# Patient Record
Sex: Male | Born: 1935 | Race: White | Hispanic: No | Marital: Married | Smoking: Former smoker
Health system: Southern US, Community
[De-identification: ages and names within clinical notes are randomized; demographics above are authoritative.]

## PROBLEM LIST (undated history)

## (undated) DIAGNOSIS — M1990 Unspecified osteoarthritis, unspecified site: Secondary | ICD-10-CM

## (undated) DIAGNOSIS — F419 Anxiety disorder, unspecified: Secondary | ICD-10-CM

## (undated) DIAGNOSIS — I4891 Unspecified atrial fibrillation: Secondary | ICD-10-CM

## (undated) DIAGNOSIS — I639 Cerebral infarction, unspecified: Secondary | ICD-10-CM

## (undated) DIAGNOSIS — I4892 Unspecified atrial flutter: Secondary | ICD-10-CM

## (undated) DIAGNOSIS — I10 Essential (primary) hypertension: Secondary | ICD-10-CM

## (undated) DIAGNOSIS — Z8669 Personal history of other diseases of the nervous system and sense organs: Secondary | ICD-10-CM

## (undated) DIAGNOSIS — C801 Malignant (primary) neoplasm, unspecified: Secondary | ICD-10-CM

## (undated) DIAGNOSIS — B0221 Postherpetic geniculate ganglionitis: Secondary | ICD-10-CM

## (undated) DIAGNOSIS — I499 Cardiac arrhythmia, unspecified: Secondary | ICD-10-CM

## (undated) DIAGNOSIS — Z932 Ileostomy status: Secondary | ICD-10-CM

## (undated) HISTORY — DX: Unspecified atrial flutter: I48.92

## (undated) HISTORY — DX: Postherpetic geniculate ganglionitis: B02.21

## (undated) HISTORY — DX: Unspecified osteoarthritis, unspecified site: M19.90

## (undated) HISTORY — PX: VASECTOMY: SHX75

## (undated) HISTORY — DX: Essential (primary) hypertension: I10

## (undated) HISTORY — PX: HERNIA REPAIR: SHX51

## (undated) HISTORY — PX: OTHER SURGICAL HISTORY: SHX169

## (undated) HISTORY — PX: CATARACT EXTRACTION: SUR2

## (undated) SURGERY — COLONOSCOPY WITH ESOPHAGOGASTRODUODENOSCOPY (EGD)
Anesthesia: Moderate Sedation

---

## 1997-08-17 ENCOUNTER — Encounter: Admission: RE | Admit: 1997-08-17 | Discharge: 1997-08-17 | Payer: Self-pay | Admitting: Family Medicine

## 1998-01-28 ENCOUNTER — Encounter: Admission: RE | Admit: 1998-01-28 | Discharge: 1998-01-28 | Payer: Self-pay | Admitting: Sports Medicine

## 1998-10-29 ENCOUNTER — Encounter: Admission: RE | Admit: 1998-10-29 | Discharge: 1998-10-29 | Payer: Self-pay | Admitting: Family Medicine

## 1998-11-06 ENCOUNTER — Inpatient Hospital Stay (HOSPITAL_COMMUNITY): Admission: EM | Admit: 1998-11-06 | Discharge: 1998-11-08 | Payer: Self-pay | Admitting: Emergency Medicine

## 1998-11-06 ENCOUNTER — Encounter: Payer: Self-pay | Admitting: Otolaryngology

## 1999-08-05 ENCOUNTER — Other Ambulatory Visit: Admission: RE | Admit: 1999-08-05 | Discharge: 1999-08-05 | Payer: Self-pay | Admitting: *Deleted

## 2000-07-05 ENCOUNTER — Encounter: Payer: Self-pay | Admitting: Allergy and Immunology

## 2000-07-05 ENCOUNTER — Encounter: Admission: RE | Admit: 2000-07-05 | Discharge: 2000-07-05 | Payer: Self-pay | Admitting: Allergy and Immunology

## 2000-12-18 ENCOUNTER — Ambulatory Visit (HOSPITAL_COMMUNITY): Admission: RE | Admit: 2000-12-18 | Discharge: 2000-12-18 | Payer: Self-pay | Admitting: Gastroenterology

## 2005-07-20 ENCOUNTER — Ambulatory Visit: Payer: Self-pay | Admitting: Family Medicine

## 2005-10-02 ENCOUNTER — Ambulatory Visit: Payer: Self-pay | Admitting: Family Medicine

## 2005-10-06 ENCOUNTER — Ambulatory Visit: Payer: Self-pay | Admitting: Family Medicine

## 2006-05-17 DIAGNOSIS — F5232 Male orgasmic disorder: Secondary | ICD-10-CM

## 2006-05-17 DIAGNOSIS — I10 Essential (primary) hypertension: Secondary | ICD-10-CM

## 2006-05-17 DIAGNOSIS — F17200 Nicotine dependence, unspecified, uncomplicated: Secondary | ICD-10-CM

## 2006-05-17 DIAGNOSIS — K5730 Diverticulosis of large intestine without perforation or abscess without bleeding: Secondary | ICD-10-CM | POA: Insufficient documentation

## 2006-05-17 DIAGNOSIS — N400 Enlarged prostate without lower urinary tract symptoms: Secondary | ICD-10-CM | POA: Insufficient documentation

## 2006-08-02 ENCOUNTER — Encounter: Admission: RE | Admit: 2006-08-02 | Discharge: 2006-08-02 | Payer: Self-pay | Admitting: Sports Medicine

## 2006-08-02 ENCOUNTER — Ambulatory Visit (HOSPITAL_COMMUNITY): Admission: RE | Admit: 2006-08-02 | Discharge: 2006-08-02 | Payer: Self-pay | Admitting: Family Medicine

## 2006-08-02 ENCOUNTER — Ambulatory Visit: Payer: Self-pay | Admitting: Family Medicine

## 2006-08-02 ENCOUNTER — Encounter: Payer: Self-pay | Admitting: Family Medicine

## 2006-08-02 DIAGNOSIS — G609 Hereditary and idiopathic neuropathy, unspecified: Secondary | ICD-10-CM | POA: Insufficient documentation

## 2006-08-02 LAB — CONVERTED CEMR LAB
Albumin: 4.2 g/dL (ref 3.5–5.2)
BUN: 23 mg/dL (ref 6–23)
CO2: 27 meq/L (ref 19–32)
Calcium: 9.3 mg/dL (ref 8.4–10.5)
Chloride: 102 meq/L (ref 96–112)
Direct LDL: 84 mg/dL
Glucose, Bld: 85 mg/dL (ref 70–99)
Potassium: 4.6 meq/L (ref 3.5–5.3)
Vitamin B-12: 305 pg/mL (ref 211–911)

## 2006-08-08 ENCOUNTER — Telehealth (INDEPENDENT_AMBULATORY_CARE_PROVIDER_SITE_OTHER): Payer: Self-pay | Admitting: Family Medicine

## 2006-08-10 ENCOUNTER — Encounter: Payer: Self-pay | Admitting: Family Medicine

## 2006-08-30 ENCOUNTER — Ambulatory Visit: Payer: Self-pay | Admitting: Sports Medicine

## 2006-08-30 DIAGNOSIS — M7750 Other enthesopathy of unspecified foot: Secondary | ICD-10-CM | POA: Insufficient documentation

## 2007-01-10 ENCOUNTER — Ambulatory Visit: Payer: Self-pay | Admitting: Family Medicine

## 2007-07-16 ENCOUNTER — Ambulatory Visit: Payer: Self-pay | Admitting: Family Medicine

## 2007-07-23 ENCOUNTER — Encounter: Payer: Self-pay | Admitting: Family Medicine

## 2007-07-23 ENCOUNTER — Ambulatory Visit: Payer: Self-pay | Admitting: Family Medicine

## 2007-07-23 LAB — CONVERTED CEMR LAB: OCCULT 1: NEGATIVE

## 2007-07-24 ENCOUNTER — Encounter: Payer: Self-pay | Admitting: Family Medicine

## 2007-07-24 LAB — CONVERTED CEMR LAB
Albumin: 4.1 g/dL (ref 3.5–5.2)
Alkaline Phosphatase: 85 units/L (ref 39–117)
BUN: 18 mg/dL (ref 6–23)
Creatinine, Ser: 1.1 mg/dL (ref 0.40–1.50)
Glucose, Bld: 89 mg/dL (ref 70–99)
HDL: 61 mg/dL (ref >39)
LDL Cholesterol: 72 mg/dL (ref 0–99)
Potassium: 4.3 meq/L (ref 3.5–5.3)
Total Bilirubin: 1 mg/dL (ref 0.3–1.2)
Total CHOL/HDL Ratio: 2.3
Triglycerides: 52 mg/dL (ref <150)

## 2007-12-10 ENCOUNTER — Ambulatory Visit: Payer: Self-pay | Admitting: Family Medicine

## 2008-04-01 ENCOUNTER — Encounter: Payer: Self-pay | Admitting: Family Medicine

## 2009-08-25 ENCOUNTER — Ambulatory Visit: Payer: Self-pay | Admitting: Sports Medicine

## 2009-08-25 ENCOUNTER — Encounter: Payer: Self-pay | Admitting: Family Medicine

## 2009-08-25 ENCOUNTER — Encounter: Admission: RE | Admit: 2009-08-25 | Discharge: 2009-08-25 | Payer: Self-pay | Admitting: Family Medicine

## 2009-08-25 DIAGNOSIS — M545 Low back pain: Secondary | ICD-10-CM

## 2009-08-25 DIAGNOSIS — R209 Unspecified disturbances of skin sensation: Secondary | ICD-10-CM | POA: Insufficient documentation

## 2009-09-01 ENCOUNTER — Telehealth: Payer: Self-pay | Admitting: Family Medicine

## 2009-09-06 ENCOUNTER — Encounter: Payer: Self-pay | Admitting: Family Medicine

## 2009-09-06 ENCOUNTER — Telehealth: Payer: Self-pay | Admitting: Family Medicine

## 2009-09-06 ENCOUNTER — Encounter: Admission: RE | Admit: 2009-09-06 | Discharge: 2009-12-05 | Payer: Self-pay | Admitting: Family Medicine

## 2009-09-30 ENCOUNTER — Encounter: Payer: Self-pay | Admitting: Sports Medicine

## 2009-10-06 ENCOUNTER — Ambulatory Visit: Payer: Self-pay | Admitting: Sports Medicine

## 2009-10-06 DIAGNOSIS — M5430 Sciatica, unspecified side: Secondary | ICD-10-CM

## 2009-11-09 ENCOUNTER — Ambulatory Visit: Payer: Self-pay | Admitting: Sports Medicine

## 2009-12-09 ENCOUNTER — Ambulatory Visit: Payer: Self-pay | Admitting: Sports Medicine

## 2010-01-06 ENCOUNTER — Ambulatory Visit: Payer: Self-pay | Admitting: Sports Medicine

## 2010-01-27 ENCOUNTER — Encounter: Payer: Self-pay | Admitting: Family Medicine

## 2010-02-15 ENCOUNTER — Ambulatory Visit: Payer: Self-pay | Admitting: Sports Medicine

## 2010-04-19 NOTE — Assessment & Plan Note (Signed)
Summary: NUMBNESS IN LEG,MC   Vital Signs:  Patient profile:   77 year old male Pulse rate:   76 / minute BP sitting:   107 / 76  (left arm)  Vitals Entered By: Terese Door (October 06, 2009 10:23 AM) CC: F/U numbness is legs   CC:  F/U numbness is legs.  History of Present Illness: Patient was doing toe ups w 120 lbs Hx of old disk injury in lumbar spine started running again but RT leg went numb and felt weak  quad and HS exercises are both OK  even at rest has RT sided numbness/ some slt change on left  at nite gets dull ache in calf and thigh  went to PT - program w exercises doing bridges adductor work swimmer position bicycle off bike back series  on these only bridge causes RT leg tingling  Physical Exam  General:  Well-developed,well-nourished,in no acute distress; alert,appropriate and cooperative throughout examination Head:  facial assymetry Msk:  neg SLR bilat no  back pain on testing and normal motion  weak on RT with repetitive toe walking or heel walking tandem is OK Neurologic:  alert & oriented X3 and cranial nerves II-XII intact.     Impression & Recommendations:  Problem # 1:  BACK PAIN, LUMBAR (ICD-724.2)  His updated medication list for this problem includes:    Cvs Aspirin Ec 81 Mg Tbec (Aspirin) .Marland Kitchen... Daily   this has resolved during day pain at night now is primarily radiating into leg  Problem # 2:  SCIATICA, RIGHT (ICD-724.3)  His updated medication list for this problem includes:    Cvs Aspirin Ec 81 Mg Tbec (Aspirin) .Marland Kitchen... Daily  has some weakness and radiating pain to calf  suspect this is from old disk injury  will start low dose neurontin at HS and increase up to three times a day if needed  Complete Medication List: 1)  Viagra 100 Mg Tabs (Sildenafil citrate) .Marland Kitchen.. 1 tab prn 2)  Hydrochlorothiazide 25 Mg Tabs (Hydrochlorothiazide) .... Take 1 tab by mouth every morning 3)  Cardizem Cd 120 Mg Cp24 (Diltiazem hcl  coated beads) .... One daiy 4)  Terazosin Hcl 5 Mg Caps (Terazosin hcl) .... One qhs 5)  Cvs Aspirin Ec 81 Mg Tbec (Aspirin) .... Daily 6)  Glucosamine-chondroitin 1500-1200 Mg/46ml Liqd (Glucosamine-chondroitin) 7)  Fish Oil 1000 Mg Caps (Omega-3 fatty acids) .... Bid 8)  Selenium 100 Mcg Tabs (Selenium) .... Daiily 9)  Optivite Tabs (Multiple vitamins-minerals) 10)  Gabapentin 300 Mg Caps (Gabapentin) .Marland Kitchen.. 1 by mouth tid  Patient Instructions: 1)  Use gabapentin only at night 2)  we can always build up dose if needed 3)  watch side effects - sedation, dizzy, nite mares 4)  OK to do strength work 5)  for running do walk/ run 6)  when leg feels wobbly walk until strong and then run again 7)  reck 1 mo Prescriptions: GABAPENTIN 300 MG CAPS (GABAPENTIN) 1 by mouth tid  #90 x 2   Entered and Authorized by:   Enid Baas MD   Signed by:   Enid Baas MD on 10/06/2009   Method used:   Electronically to        [facility]  [phone]* (retail)       [address]       [place], [place]  [address]       Ph: [phone] or [phone]  Fax: [phone]   RxID:   [id number]

## 2010-04-19 NOTE — Letter (Signed)
Summary: [facility] Pt referral form  [facility] Pt referral form   Imported By: Marily Memos 08/25/2009 14:44:46  _____________________________________________________________________  External Attachment:    Type:   Image     Comment:   External Document

## 2010-04-19 NOTE — Miscellaneous (Signed)
Summary: [facility]  [facility]   Imported By: Marily Memos 09/08/2009 10:59:12  _____________________________________________________________________  External Attachment:    Type:   Image     Comment:   External Document

## 2010-04-19 NOTE — Assessment & Plan Note (Signed)
Summary: F/U,MC   Vital Signs:  Patient profile:   77 year old male BP sitting:   127 / 82  Vitals Entered By: Lillia Pauls CMA (February 15, 2010 11:52 AM)  History of Present Illness: 77yo male for f/u of sciatica & low back.  Overall feels gradual improvement continuing Not having any back pain at this time, only with excessive standing seems to givemhim some pain that radiates anteriorly. Usually right sided. Never past knee, did have some foot drop  previously but not having any problem at this time.   Still with occasional aching at night but much less frequent. . continues to walk 30-mins every morning without pain/discomfort and going to the gym daily would like to start running again if possible.  Taking gabapentin 600mg  three times a day, overall not experiencing any side effects.  Tramadol as needed  Continuing HEP for back & hip. Denies any change in bowel/bladder.     Current Medications (verified): 1)  Viagra 100 Mg  Tabs (Sildenafil Citrate) .Marland Kitchen.. 1 Tab Prn 2)  Hydrochlorothiazide 25 Mg  Tabs (Hydrochlorothiazide) .... Take 1 Tab By Mouth Every Morning 3)  Cardizem Cd 120 Mg  Cp24 (Diltiazem Hcl Coated Beads) .... One Daiy 4)  Terazosin Hcl 5 Mg  Caps (Terazosin Hcl) .... One Qhs 5)  Cvs Aspirin Ec 81 Mg  Tbec (Aspirin) .... Daily 6)  Glucosamine-Chondroitin 1500-1200 Mg/62ml  Liqd (Glucosamine-Chondroitin) 7)  Fish Oil 1000 Mg  Caps (Omega-3 Fatty Acids) .... Bid 8)  Selenium 100 Mcg  Tabs (Selenium) .... Daiily 9)  Optivite   Tabs (Multiple Vitamins-Minerals) 10)  Gabapentin 300 Mg Caps (Gabapentin) .Marland Kitchen.. 1 By Mouth Tid 11)  Tramadol Hcl 50 Mg Tabs (Tramadol Hcl) .Marland Kitchen.. 1 Tab By Mouth Three Times A Day 12)  Gabapentin 600 Mg Tabs (Gabapentin) .Marland Kitchen.. 1 Tab By Mouth Tid  Allergies (verified): No Known Drug Allergies  Past History:  Past medical, surgical, family and social histories (including risk factors) reviewed, and no changes noted (except as noted  below).  Past Medical History: Reviewed history from 07/16/2007 and no changes required. clindamycin 300 , 2 prior to dental procedures,  Ramsey Hunt-left facila weakness,  Supplements:selenium, glucosamine, fish oil  Past Surgical History: Reviewed history from 07/16/2007 and no changes required. Prostate biopsies per [facility] Bilateral cataract removal at [facility]  Family History: Reviewed history and no changes required.  Social History: Reviewed history from 05/17/2006 and no changes required. Second marriage, retired, very active  Physical Exam  General:  Well-developed,well-nourished,in no acute distress; alert,appropriate and cooperative throughout examination Msk:  Back: Good ROM without pain.  No TTP over midline or paraspinal muscles.  Neg SLR b/l.  Able to toe walk & heel walk.  able to jog without pain   HIPS:  no gross deformity.  Normal IR & ER without pain.  Neg log roll.  Neg FABER b/l.  Strength 5-/5 on right with abduction & adduction, +5/5 on left.  +5/5 extension & flexion b/l.pt does have +1 clonus b/l.  Pulses:  +2/4 DP & PT b/l Neurologic:  DTR: +1/4 R patella, +2/4 R achilles, Lt patella, Lt achilles. sensation intact to light touch.   clonus 1 beat b/l   Impression & Recommendations:  Problem # 1:  SCIATICA, RIGHT (ICD-724.3)  - Continue to improve slowly with regimen - Cont. Gabapentin 600mg  three times a day - refill given today for 600mg  tabs - Start Tramadol 50mg  three times a day to augment effects of gabapentin.   -  Cont. Vit B6 & HEP - f/u in 2-3 months  His updated medication list for this problem includes:    Cvs Aspirin Ec 81 Mg Tbec (Aspirin) .Marland Kitchen... Daily    Tramadol Hcl 50 Mg Tabs (Tramadol hcl) .Marland Kitchen... 1 tab by mouth three times a day  Problem # 2:  BACK PAIN, LUMBAR (ICD-724.2) same as above, with clonus and anterlitethisis on x-ray likely some spinal stenosis.  will monitor clonus at next visit.  gave pt red flags to look for.  His  updated medication list for this problem includes:    Cvs Aspirin Ec 81 Mg Tbec (Aspirin) .Marland Kitchen... Daily    Tramadol Hcl 50 Mg Tabs (Tramadol hcl) .Marland Kitchen... 1 tab by mouth three times a day  Complete Medication List: 1)  Viagra 100 Mg Tabs (Sildenafil citrate) .Marland Kitchen.. 1 tab prn 2)  Hydrochlorothiazide 25 Mg Tabs (Hydrochlorothiazide) .... Take 1 tab by mouth every morning 3)  Cardizem Cd 120 Mg Cp24 (Diltiazem hcl coated beads) .... One daiy 4)  Terazosin Hcl 5 Mg Caps (Terazosin hcl) .... One qhs 5)  Cvs Aspirin Ec 81 Mg Tbec (Aspirin) .... Daily 6)  Glucosamine-chondroitin 1500-1200 Mg/37ml Liqd (Glucosamine-chondroitin) 7)  Fish Oil 1000 Mg Caps (Omega-3 fatty acids) .... Bid 8)  Selenium 100 Mcg Tabs (Selenium) .... Daiily 9)  Optivite Tabs (Multiple vitamins-minerals) 10)  Gabapentin 300 Mg Caps (Gabapentin) .Marland Kitchen.. 1 by mouth tid 11)  Tramadol Hcl 50 Mg Tabs (Tramadol hcl) .Marland Kitchen.. 1 tab by mouth three times a day 12)  Gabapentin 600 Mg Tabs (Gabapentin) .Marland Kitchen.. 1 tab by mouth tid   Orders Added: 1)  Est. Patient Level III [[address]]

## 2010-04-19 NOTE — Progress Notes (Signed)
  Phone Note Call from Patient   Caller: Patient Call For: Valarie Merino, M.D. Reason for Call: Talk to Doctor Summary of Call: Patient returned my call from earlier today (09/01/2009). We discussed his x-ray findings. He is adhering to recommendations from last office visit. He notes improving symptoms. Will start formal physical therapy. He will RTC in 2 weeks. Call placed by: Valarie Merino MD,  September 01, 2009 11:00 AM

## 2010-04-19 NOTE — Assessment & Plan Note (Signed)
Summary: F/U,MC   Vital Signs:  Patient profile:   77 year old male Height:      73 inches Weight:      203 pounds BMI:     26.88 BP sitting:   123 / 79  Vitals Entered By: Lillia Pauls CMA (December 09, 2009 12:00 PM)  History of Present Illness: 77yo male for f/u of sciatica & low back. Denies any back pain at this time. Continues to have persistant pain radiating down right leg.  Associated numbness/tingling along anterior aspect of right leg as well. Still having occasional aching at night. Is walking 30-mins every morning without too much discomfort, mowed lawn 2 days ago & aggrevated symptoms. Taking gabapentin 300mg  three times a day, denies any significant side effects. Following home exercises for hip & core strengthing. Still notes occasional right foot drop, but not all the time. Denies changes in bowel/bladder. PMH-FH-SH reviewed for relevance  Review of Systems      See HPI  Physical Exam  General:  Well-developed,well-nourished,in no acute distress; alert,appropriate and cooperative throughout examination Msk:  Back: mildly decreased lumbar lordosis.  Good ROM without pain.  No TTP over midline or paraspinal muscles.  Neg SLR b/l.  Able to toe walk & heel walk.  HIPS:  no gross deformity.  Normal IR & ER without pain.  Neg log roll.  Neg FABER b/l.  Strength 5-/5 on right with abduction & adduction, +5/5 on left.  +5/5 extension & flexion b/l. Pulses:  +2/4 DP & PT b/l Neurologic:  DTR: +1/4 R patella, +2/4 R achilles, Lt patella, Lt achilles. sensation intact to light touch.     Impression & Recommendations:  Problem # 1:  SCIATICA, RIGHT (ICD-724.3) Assessment Unchanged - Titrate up dose of gabapentin to 600mg  three times a day over next 3-4 weeks as outlined in Pt Instructions - Recommend use of Vit B6 100mg  daily - Cont exercises - f/u 63-month for re-evaluation   His updated medication list for this problem includes:    Cvs Aspirin Ec 81 Mg Tbec  (Aspirin) .Marland Kitchen... Daily  Problem # 2:  BACK PAIN, LUMBAR (ICD-724.2) Assessment: Improved - Cont. home exercises  His updated medication list for this problem includes:    Cvs Aspirin Ec 81 Mg Tbec (Aspirin) .Marland Kitchen... Daily  Problem # 3:  NUMBNESS (ICD-782.0) Assessment: Unchanged - Likely secondary to anterolisthesis noted on previous films with some cord irritation - Titrate gabapentin as stated above - Cont. home exercises - f/u 47-month  Complete Medication List: 1)  Viagra 100 Mg Tabs (Sildenafil citrate) .Marland Kitchen.. 1 tab prn 2)  Hydrochlorothiazide 25 Mg Tabs (Hydrochlorothiazide) .... Take 1 tab by mouth every morning 3)  Cardizem Cd 120 Mg Cp24 (Diltiazem hcl coated beads) .... One daiy 4)  Terazosin Hcl 5 Mg Caps (Terazosin hcl) .... One qhs 5)  Cvs Aspirin Ec 81 Mg Tbec (Aspirin) .... Daily 6)  Glucosamine-chondroitin 1500-1200 Mg/97ml Liqd (Glucosamine-chondroitin) 7)  Fish Oil 1000 Mg Caps (Omega-3 fatty acids) .... Bid 8)  Selenium 100 Mcg Tabs (Selenium) .... Daiily 9)  Optivite Tabs (Multiple vitamins-minerals) 10)  Gabapentin 300 Mg Caps (Gabapentin) .Marland Kitchen.. 1 by mouth tid  Patient Instructions: 1)  Increase gabapentin gradually over the next month. 2)  - starting today take 1 in the AM, 1 at noon, 2 at bedtime x 1 wk,  then 3)  - 1 in the AM, 2 at noon, 2 at bedtime x 1wk, then 4)  - 2 in the AM, 2  at noon, 2 at bedtime thereafter 5)  Continue hip exercises with leg raises & hip rotations - 3 sets of 15 6)  Continue with back flexion exercises including knee to chest & knee across chest & sit-ups 7)  Avoid back extension (up-dog, cobra, bridge pose).

## 2010-04-19 NOTE — Progress Notes (Signed)
     Follow-up for Phone Call       Follow-up by: Valarie Merino MD,  September 01, 2009 10:57 AM    Additional Follow-up for Phone Call Additional follow up Details #2::    Left a voice-message for the patient to return our call at his earliest convenience. *The purpose of this call was to discuss his 08/25/2009 LS-Spine x-rays findings.

## 2010-04-19 NOTE — Miscellaneous (Signed)
   Clinical Lists Changes  Problems: Removed problem of SCREENING FOR LIPOID DISORDERS (ICD-V77.91) Removed problem of VACCINE, VIRAL DISEASES NEC (ICD-V04.89) Removed problem of VACCINE AGAINST INFLUENZA (ICD-V04.81) Removed problem of BICEPS TENDINITIS, RIGHT (ICD-726.12) Removed problem of CONSTIPATION (ICD-564.0)

## 2010-04-19 NOTE — Assessment & Plan Note (Signed)
Summary: NUMBNESS IN LEGS,MC   Vital Signs:  Patient profile:   77 year old male BP sitting:   131 / 82  Vitals Entered By: Lillia Pauls CMA (August 25, 2009 1:42 PM)  History of Present Illness: DOI: N/A  Reports to address intermittent bilateral leg numbness. Does not complain much of pain.  Problem started  a few months ago, a few weeks after a strenous workout. Recalls performing calf raises with 125 pounds across his shoulders, core machine exercises, and verious other exercises which stressed his lower back. Noticed bilateral buttock soreness, without sciatica/paresthesias, the next day. Over the ensuing few weeks he developed intermittent transient numbness throghout the legs. Equivocal re: distribution and further description of his numbness. Unaware of any moderating factors. No saddle anesthesia, bowel/bladder incontinence, tingling, or weakness. No difficulty ambulating.  Does recall a history of what sounds like a herniated lumbar disc; dx'ed >20 years ago. Intermittent mild low back pain. No formal physical therapy.  No recent LS-Spine imaging. No past back trauma/injections/sugeries.  No hx DM or infectious disease such as syphilis. No ETOH abuse.  Routine Activities: Unemployed. Frequently performs yard/gardening work.   Physical Exam  General:  Well-developed,well-nourished,in no acute distress; alert,appropriate and cooperative throughout examination Msk:  BACK: No spinal ttp. No apparent bony deformity. Mildly limited extension and flexion (with component of HS tightness) with mild dyscomfort. No ttp of the lumbar musculature. No apparent  spasm.  KNEES/ANKLES/FEET/TOES: Full ROM/strength.  GAIT: Non-antalgic. No foot drop. Pulses:  2+ B pt/dp pulses. Neurologic:  2+ DTR w/intact sensation throughout the lower extremities.  Equivocal SLR bilaterally. No foot drop. (-) Babinski's.   Impression & Recommendations:  Problem # 1:  NUMBNESS  (ICD-782.0) Unclear etiology. Possibly the result of lumbar radiculopathy and underlying lumbar DJD. No clear signs of underlying medical disorder. Management options, including their respective risks and benefits were discussed during this encounter.  - X-rays of the LS Spine to assess for significant degenerative changes. - Will determine further mgmt once x-ray results performed. Will likely refer to physical therapy. - If no improvement, then will consider further workup (e.g., NCS, MRI, serologies, etc.).  Orders: Radiology other (Radiology Other)  Problem # 2:  BACK PAIN, LUMBAR (ICD-724.2)  - Per item #1. - Avoid strenous crunches, back flexion/extension, etc. - Daily lumbar stretches, demonstrated during this encounter, qAM and qHS as tolerated. - Wear an OTC back brace per pack instructions for exercise or household chore activities. - Immediately seek MD attention for anesthesia, incontinence, or any other concerns.  His updated medication list for this problem includes:    Cvs Aspirin Ec 81 Mg Tbec (Aspirin) .Marland Kitchen... Daily  Complete Medication List: 1)  Viagra 100 Mg Tabs (Sildenafil citrate) .Marland Kitchen.. 1 tab prn 2)  Hydrochlorothiazide 25 Mg Tabs (Hydrochlorothiazide) .... Take 1 tab by mouth every morning 3)  Cardizem Cd 120 Mg Cp24 (Diltiazem hcl coated beads) .... One daiy 4)  Terazosin Hcl 5 Mg Caps (Terazosin hcl) .... One qhs 5)  Cvs Aspirin Ec 81 Mg Tbec (Aspirin) .... Daily 6)  Glucosamine-chondroitin 1500-1200 Mg/34ml Liqd (Glucosamine-chondroitin) 7)  Fish Oil 1000 Mg Caps (Omega-3 fatty acids) .... Bid 8)  Selenium 100 Mcg Tabs (Selenium) .... Daiily 9)  Optivite Tabs (Multiple vitamins-minerals)

## 2010-04-19 NOTE — Assessment & Plan Note (Signed)
Summary: F/U,MC   Vital Signs:  Patient profile:   77 year old male BP sitting:   135 / 79  Vitals Entered By: Lillia Pauls CMA (January 06, 2010 11:47 AM)  History of Present Illness: 77yo male for f/u of sciatica & low back.  Overall feels 20-30% better Not having any back pain at this time. Still with persistant pain radiating down right leg - anterior thigh, past knee, to lateral aspect of lower leg.  Associated numbness & tingling in this area. Still with occasional aching at night. continues to walk 30-mins every morning without pain/discomfort. Taking gabapentin 600mg  three times a day, feel slightly unbalanced at times & a little slow to wake in the morning, but overall not experiencing any side effects. Continuing HEP for back & hip. Denies any change in bowel/bladde.    PMH-FH-SH reviewed for relevance  Review of Systems      See HPI  Physical Exam  General:  Well-developed,well-nourished,in no acute distress; alert,appropriate and cooperative throughout examination Msk:  Back: Good ROM without pain.  No TTP over midline or paraspinal muscles.  Neg SLR b/l.  Able to toe walk & heel walk.    HIPS:  no gross deformity.  Normal IR & ER without pain.  Neg log roll.  Neg FABER b/l.  Strength 5-/5 on right with abduction & adduction, +5/5 on left.  +5/5 extension & flexion b/l. Neurologic:  DTR: +1/4 R patella, +2/4 R achilles, Lt patella, Lt achilles. sensation intact to light touch.     Impression & Recommendations:  Problem # 1:  SCIATICA, RIGHT (ICD-724.3) Assessment Improved - 20-30% improved from last visit - Cont. Gabapentin 600mg  three times a day - refill given today for 600mg  tabs - Start Tramadol 50mg  three times a day to augment effects of gabapentin.  Reviewed side effects. - Cont. Vit B6 & HEP - f/u 4-weeks to evaluate progress  His updated medication list for this problem includes:    Cvs Aspirin Ec 81 Mg Tbec (Aspirin) .Marland Kitchen... Daily    Tramadol Hcl 50  Mg Tabs (Tramadol hcl) .Marland Kitchen... 1 tab by mouth three times a day  Problem # 2:  NUMBNESS (ICD-782.0) Assessment: Improved - Likely secondary to anterolisthesis noted on previous films with some cord irritation - Cont. gabapentin, tramadol added as stated above - f/u 4-weeks  Problem # 3:  BACK PAIN, LUMBAR (ICD-724.2) - Stable at this time, no issues with back pain presently  His updated medication list for this problem includes:    Cvs Aspirin Ec 81 Mg Tbec (Aspirin) .Marland Kitchen... Daily    Tramadol Hcl 50 Mg Tabs (Tramadol hcl) .Marland Kitchen... 1 tab by mouth three times a day  Complete Medication List: 1)  Viagra 100 Mg Tabs (Sildenafil citrate) .Marland Kitchen.. 1 tab prn 2)  Hydrochlorothiazide 25 Mg Tabs (Hydrochlorothiazide) .... Take 1 tab by mouth every morning 3)  Cardizem Cd 120 Mg Cp24 (Diltiazem hcl coated beads) .... One daiy 4)  Terazosin Hcl 5 Mg Caps (Terazosin hcl) .... One qhs 5)  Cvs Aspirin Ec 81 Mg Tbec (Aspirin) .... Daily 6)  Glucosamine-chondroitin 1500-1200 Mg/46ml Liqd (Glucosamine-chondroitin) 7)  Fish Oil 1000 Mg Caps (Omega-3 fatty acids) .... Bid 8)  Selenium 100 Mcg Tabs (Selenium) .... Daiily 9)  Optivite Tabs (Multiple vitamins-minerals) 10)  Gabapentin 300 Mg Caps (Gabapentin) .Marland Kitchen.. 1 by mouth tid 11)  Tramadol Hcl 50 Mg Tabs (Tramadol hcl) .Marland Kitchen.. 1 tab by mouth three times a day 12)  Gabapentin 600 Mg Tabs (Gabapentin) .Marland KitchenMarland KitchenMarland Kitchen  1 tab by mouth tid  Patient Instructions: 1)  Start taking Tramadol 1 tab every 8-hrs with your gabapentin.  If tramadol at 9pm keeps you awake, may stop taking this dose.  Additional side effects include constipation, dizziness, sleepiness, so be aware of these. 2)  Cont. to do your hip & back exercises as you are doing. 3)  Cont. Vit B6 daily as you are doing. 4)  Cont. gabapentin 600mg  1 tab three times a day 5)  Follow-up in 4-weeks. Prescriptions: GABAPENTIN 600 MG TABS (GABAPENTIN) 1 tab by mouth tid  #90 x 3   Entered by:   Lillia Pauls CMA   Authorized  by:   Darene Lamer MD   Signed by:   Lillia Pauls CMA on 01/06/2010   Method used:   Electronically to        [facility]  [phone]* (retail)       [address]       [place], [place]  [address]       Ph: [phone] or [phone]       Fax: [phone]   RxID:   [id number] TRAMADOL HCL 50 MG TABS (TRAMADOL HCL) 1 tab by mouth three times a day  #90 x 1   Entered by:   Lillia Pauls CMA   Authorized by:   Darene Lamer MD   Signed by:   Lillia Pauls CMA on 01/06/2010   Method used:   Electronically to        [facility]  [phone]* (retail)       [address]       [place], [place]  [address]       Ph: [phone] or [phone]       Fax: [phone]   RxID:   [id number]    Orders Added: 1)  Est. Patient Level IV [607[address]]

## 2010-04-19 NOTE — Assessment & Plan Note (Signed)
Summary: F/U,MC   Vital Signs:  Patient profile:   77 year old male BP sitting:   137 / 89  Vitals Entered By: Lillia Pauls CMA (November 09, 2009 11:24 AM)  History of Present Illness: Follow up for sciatica.  Standing is uncomfortable and causes pain to radiate down the right leg (left as well to a much smaller degree), continuing to move helps. After walking or running for , feels numbness in teh right leg. Some aching in the leg at night, that eventually goes away. Patient is taking 1 dose of gabapentin at night. Not taking any NSAIDs.  No real pain in the low back. Went to PT and is doing exercises, without noticing significant change. Some right sided foot drop, doing a seated toe-ups. Still exercising qutie a bit, stationary bike.  No changes in bowel or bladder incontinence. No history of long-term steroid use. No family history of osteoporosis. Takes a multivitamin with calcium and vitamin D.  Physical Exam  General:  Well-developed,well-nourished,in no acute distress; alert,appropriate and cooperative throughout examination Msk:  Back: no tenderness to palpation along spine or transverse processes Some decreased lordosis. no pain with spine flexion or extension, normal range of motion with rotation and side bends. no trendelenberg, some weakness of hip muslces on one foot. some weakness and supination in balancing on toes. some tingling with standing on heels. 5/5 hip flexion, knee extension normal hip intenal and external rotation without pain Negative straight leg tests and FABER, bilaterally. Hip abduction 4/5 on right, 5/5 on left.   Impression & Recommendations:  Problem # 1:  BACK PAIN, LUMBAR (ICD-724.2)  His updated medication list for this problem includes:    Cvs Aspirin Ec 81 Mg Tbec (Aspirin) .Marland Kitchen... Daily   less back pain unless doing extension and will modify this  Problem # 2:  SCIATICA, RIGHT (ICD-724.3)  His updated medication list for this  problem includes:    Cvs Aspirin Ec 81 Mg Tbec (Aspirin) .Marland Kitchen... Daily  This is still troublesome will work to steadily increase gabapentin to control sxs  would like to reck after 1 mo  Problem # 3:  NUMBNESS (ICD-782.0) with some numbness going into both legs I feel the anterolisthesis noted on lumbar films is prob causing some intermittent cord pressure  see instructions  Complete Medication List: 1)  Viagra 100 Mg Tabs (Sildenafil citrate) .Marland Kitchen.. 1 tab prn 2)  Hydrochlorothiazide 25 Mg Tabs (Hydrochlorothiazide) .... Take 1 tab by mouth every morning 3)  Cardizem Cd 120 Mg Cp24 (Diltiazem hcl coated beads) .... One daiy 4)  Terazosin Hcl 5 Mg Caps (Terazosin hcl) .... One qhs 5)  Cvs Aspirin Ec 81 Mg Tbec (Aspirin) .... Daily 6)  Glucosamine-chondroitin 1500-1200 Mg/66ml Liqd (Glucosamine-chondroitin) 7)  Fish Oil 1000 Mg Caps (Omega-3 fatty acids) .... Bid 8)  Selenium 100 Mcg Tabs (Selenium) .... Daiily 9)  Optivite Tabs (Multiple vitamins-minerals) 10)  Gabapentin 300 Mg Caps (Gabapentin) .Marland Kitchen.. 1 by mouth tid  Patient Instructions: 1)  Take the gabapentin: 1 tablet twice a day. If you feel like you could tolerate it after 2 weeks, increase the dose to 1 tablet three times a day.  2)  Avoid back extension (up-dog, cobra, bridge pose). 3)  Continue with back flexion exercises including knee to chest and knee across chest and sit-ups as well. 4)  Work on strengthening the left hip by doing left leg rises and hip rotation exercises 3 sets of 15.

## 2010-04-19 NOTE — Miscellaneous (Signed)
Summary: [facility]  [facility]   Imported By: Marily Memos 10/06/2009 10:21:36  _____________________________________________________________________  External Attachment:    Type:   Image     Comment:   External Document

## 2010-04-20 ENCOUNTER — Encounter: Payer: Self-pay | Admitting: *Deleted

## 2010-05-19 ENCOUNTER — Encounter: Payer: Self-pay | Admitting: Home Health Services

## 2010-06-14 ENCOUNTER — Ambulatory Visit (INDEPENDENT_AMBULATORY_CARE_PROVIDER_SITE_OTHER): Payer: MEDICARE | Admitting: Family Medicine

## 2010-06-14 ENCOUNTER — Encounter: Payer: Self-pay | Admitting: Family Medicine

## 2010-06-14 ENCOUNTER — Ambulatory Visit
Admission: RE | Admit: 2010-06-14 | Discharge: 2010-06-14 | Disposition: A | Discharge status: Home / Self Care | Payer: MEDICARE | Source: Ambulatory Visit | Attending: Family Medicine | Admitting: Family Medicine

## 2010-06-14 ENCOUNTER — Other Ambulatory Visit: Payer: Self-pay | Admitting: Family Medicine

## 2010-06-14 VITALS — BP 120/92 | Ht 73.0 in | Wt 203.0 lb

## 2010-06-14 DIAGNOSIS — M25562 Pain in left knee: Secondary | ICD-10-CM

## 2010-06-14 DIAGNOSIS — M25462 Effusion, left knee: Secondary | ICD-10-CM | POA: Insufficient documentation

## 2010-06-14 DIAGNOSIS — M25469 Effusion, unspecified knee: Secondary | ICD-10-CM

## 2010-06-14 DIAGNOSIS — M25569 Pain in unspecified knee: Secondary | ICD-10-CM

## 2010-06-14 NOTE — Progress Notes (Signed)
Patient presents with 2-3 week h/o L sided knee pain after no specific injury. No audible pop was heard. The patient has had a significant effusion. No symptomatic giving-way. No mechanical clicking. Joint has not locked up. Patient has been able to walk but is limping mildly The patient does have pain going up and down stairs or rising from a seated position. Limited his activity some.  Current physical activity: works out 4-5 days a week Current pain meds: tylenol / nsaids Bracing: compression sleeve  The PMH, PSH, Social History, Family History, Medications, and allergies have been reviewed in [facility], and have been updated if relevant.   GEN: Well-developed,well-nourished,in no acute distress; alert,appropriate and cooperative throughout examination HEENT: Normocephalic and atraumatic without obvious abnormalities. Ears, externally no deformities PULM: Breathing comfortably in no respiratory distress EXT: No clubbing, cyanosis, or edema PSYCH: Normally interactive. Cooperative during the interview. Pleasant. Friendly and conversant. Not anxious or depressed appearing. Normal, full affect.  l knee Gait: antalgic ROM: lacks 4 deg ext, 100 flex Effusion: large eff Echymosis or edema: none Patellar tendon NT Painful PLICA: neg Patellar grind: negative Medial and lateral patellar facet loading: pos medial and lateral joint lines: pos Varus and valgus stress: stable Lachman: neg Ant and Post drawer: neg Hip abduction, IR, ER: WNL Hip flexion str: 5/5 Hip abd: 5/5 Quad: 5/5 VMO atrophy:No Hamstring concentric and eccentric: 5/5    A/p: 1. Knee pain and effusion, L: probable OA flare, cannot rule out degenerative meniscus Cont with sleeve, activity mod 2-3 weeks  Knee Aspiration and Injection, L Patient verbally consented; risks, benefits, and alternatives explained. Patient prepped with betadine. Ethyl chloride for anesthesia. 10 cc of 1% Lidocaine used in wheal then injected  Subcutaneous fashion with 27 gauge needle on lateral approach. Under sterilne conditions, 18 gauge needle used via lateral approach to aspirate 50 cc of clear, yellow synovial fluid. Then 9 cc of Lidocaine 1% and 1 cc of Kenalog 40 mg injected. Tolerated well, decreased pain, no complications.  The patient also brought in a CT from the [place] in [place] and asked my opinion. CT read discusses severe coronary artery disease without contrast material. No chest pain. Advised that he may have significant CAD, likely needs a stress test, and have referred him to [facility].

## 2010-06-15 ENCOUNTER — Telehealth: Payer: Self-pay | Admitting: *Deleted

## 2010-06-15 NOTE — Telephone Encounter (Signed)
Message copied by Resa Miner on Wed Jun 15, 2010 11:01 AM ------      Message from: Hannah Beat      Created: Tue Jun 14, 2010  6:07 PM       Call            Knee xrays don't look too bad, mild OA in all sections of knee as we expected.

## 2010-06-15 NOTE — Telephone Encounter (Signed)
Spoke with pt- gave x-ray results below per Dr. Patsy Lager.

## 2010-06-23 ENCOUNTER — Encounter: Payer: Self-pay | Admitting: Home Health Services

## 2010-06-23 ENCOUNTER — Ambulatory Visit (INDEPENDENT_AMBULATORY_CARE_PROVIDER_SITE_OTHER): Payer: MEDICARE | Admitting: Home Health Services

## 2010-06-23 VITALS — BP 149/84 | HR 56 | Temp 97.9°F | Ht 72.5 in | Wt 207.0 lb

## 2010-06-23 DIAGNOSIS — Z0000 Encounter for general adult medical examination without abnormal findings: Secondary | ICD-10-CM

## 2010-06-23 NOTE — Progress Notes (Signed)
Patient here for annual wellness visit, patient reports: Risk Factors/Conditions needing evaluation or treatment: Patient does not have any risk factors that need evaluation. Home Safety: Patient lives with wife in home.  Patient reports having smoke detectors and does not have adaptive equipment in his bathroom. Other Information: Corrective lens: Patient wears reading glasses.  Has had cataracts surgery.  Visits eye doctor annually.  Dentures: Patient does not have dentures and visits dentist annually. Memory: Patient denies any memory loss.  Balance max value patientvalue  Sitting balance 1 1  Arise 2 2  Attempts to arise 2 2  Immediate standing balance 2 2  Standing balance 1 1  Nudge 2 2  Eyes closed 1 1  360 degree turn 1 1  Sitting down 2 2   Gait max value patient value  Initiation of gait 1 1  Step length-left 1 1  Step length-right 1 1  Step height-left 1 1  Step height-right 1 1  Step symmetry 1 1  Step continuity 1 1  Path 2 2  Trunk 2 2  Walking stance 1 1   Balance/Gait Score: 26/26    Annual Wellness Visit Requirements Recorded Today In  Medical, family, social history Past Medical, Family, Social History Section  Current providers Care team  Current medications Medications  Wt, BP, Ht, BMI Vital signs  Hearing assessment (welcome visit) Declined  Tobacco, alcohol, illicit drug use History  ADL Nurse Assessment  Depression Screening Nurse Assessment  Cognitive impairment/Mini Mental Status Nurse Assessment/ Flowsheet  Fall Risk Nurse Assessment  Home Safety Progress Note  End of Life Planning (welcome visit) Social Documentation  Medicare preventative services Progress Note  Risk factors/conditions needing evaluation/treatment Progress Note  Personalized health advice Patient Instructions, goals, letter  Diet & Exercise Social Documentation  Emergency Contact Social Documentation  Seat Belts Social Documentation  Sun exposure/protection Social  Documentation    .Medicare Prevention Plan: Patient is up to date on all recommended screenings.   Recommended Medicare Prevention Screenings Men over 63 Test For Frequency Date of Last- BOLD if needed  Colorectal Cancer 1-10 yrs 1/02  Prostate Cancer Never or yearly Monitored yearly with urologist  Aortic Aneurysm Once if 65-75 with hx of smoking 2010  Cholesterol 5 yrs 5/09  Diabetes yearly Non diabetic  HIV yearly declined  Influenza Shot yearly 9/09  Pneumonia Shot once 5/07  Zostavax Shot once 10/08

## 2010-06-23 NOTE — Patient Instructions (Signed)
1. Eat 4-5 vegetables a day. 2. Work on reaching goal weight of 190 lbs. 3. Schedule a colonoscopy. 4. Good luck with your cardiology appointment!

## 2010-06-28 ENCOUNTER — Telehealth: Payer: Self-pay | Admitting: Cardiovascular Disease

## 2010-06-28 NOTE — Telephone Encounter (Signed)
Pt having light chest pain on and off. Pt want to know what does he need to do if they get worse [place]. Pt# [phone] room# [id number]

## 2010-06-28 NOTE — Telephone Encounter (Signed)
Patient has been having mild CP in his chest that comes & goes. Patient has actually never seen a cardiologist in the past but had an MRI through the [place] office that possibly showed calcification in his coronary arteries. I advised him to try taking an antacid to see if this helps. If the CP continues [place] beach, I instructed him to go to an urgent care for evaluation. He agrees and will call me back at the end of the week if his CP continues. He has an appointment with Dr. Clifton James on 07/13/10.

## 2010-07-12 ENCOUNTER — Encounter: Payer: Self-pay | Admitting: Cardiovascular Disease

## 2010-07-13 ENCOUNTER — Ambulatory Visit (INDEPENDENT_AMBULATORY_CARE_PROVIDER_SITE_OTHER): Payer: MEDICARE | Admitting: Cardiovascular Disease

## 2010-07-13 ENCOUNTER — Encounter: Payer: Self-pay | Admitting: Cardiovascular Disease

## 2010-07-13 DIAGNOSIS — R079 Chest pain, unspecified: Secondary | ICD-10-CM | POA: Insufficient documentation

## 2010-07-13 DIAGNOSIS — R011 Cardiac murmur, unspecified: Secondary | ICD-10-CM | POA: Insufficient documentation

## 2010-07-13 NOTE — Assessment & Plan Note (Signed)
Will get echo to assess murmur.

## 2010-07-13 NOTE — Assessment & Plan Note (Signed)
This patient has a history of heavy tobacco abuse in the past, and a personal history of HTN. He has exertional chest pain. His recent CT chest in workup of pulmonary nodules at the [facility] hospital showed enlarged heart and coronary artery calcifications. I think further cardiac workup is indicated given his imaging abnormality and high probability of obstructive CAD. I will arrange an exercise myoview to exclude ischemia.

## 2010-07-13 NOTE — Progress Notes (Signed)
History of Present Illness: 77 yo WM with history of HTN, BPH, former tobacco abuse (stopped 1985), OA here today to to establish cardiology care. He gets his medications at the [place] in [place]. His primary care is with [facility] Primary care Dr. Tenny Craw. He tells me that he had a CT scan of the chest in November 2011 for f/u of pulmonary nodules. His coronary arteries showed calcification. He is here today for a cardiology opinion. He tells me that he has had substernal chest pressure over the last few weeks. This is exertional.  No associated symptoms. The pain lasts for a few minutes and resolves with rest. He has no prior cardiac issues.   Past Medical History  Diagnosis Date  . Ramsay Hunt auricular syndrome   . HTN (hypertension)     Past Surgical History  Procedure Date  . Hernia repair   . Vasectomy   . Prostate biopsies     per Vonita Moss  . Cataract extraction     SE eye    Current Outpatient Prescriptions  Medication Sig Dispense Refill  . aspirin (CVS ASPIRIN EC) 81 MG EC tablet Take 81 mg by mouth daily.        . Cholecalciferol (VITAMIN D-3 PO) Take 5,000 Units by mouth daily.        Marland Kitchen diltiazem (CARDIZEM CD) 120 MG 24 hr capsule Take 120 mg by mouth daily.       Marland Kitchen gabapentin (NEURONTIN) 600 MG tablet Take 600 mg by mouth 2 (two) times daily.       . hydrochlorothiazide 25 MG tablet Take 25 mg by mouth every morning.       . Multiple Vitamins-Minerals (OPTIVITE) TABS NO INSTRUCTIONS GIVEN       . Omega-3 Fatty Acids (FISH OIL) 1000 MG CAPS Take 1 capsule by mouth 2 (two) times daily.       Marland Kitchen omeprazole (PRILOSEC) 20 MG capsule Take 1 capsule by mouth daily.       Marland Kitchen pyridOXINE (VITAMIN B-6) 100 MG tablet Take 100 mg by mouth daily.        . Selenium 100 MCG TABS Take 2 tablets by mouth daily.       . sildenafil (VIAGRA) 100 MG tablet Take 100 mg by mouth as needed.       . terazosin (HYTRIN) 5 MG capsule Take 5 mg by mouth at bedtime.       . traMADol (ULTRAM) 50 MG tablet Take 50 mg  by mouth 2 (two) times daily as needed.       . zinc gluconate 50 MG tablet Take 50 mg by mouth daily.        Marland Kitchen DISCONTD: gabapentin (NEURONTIN) 300 MG capsule Take 300 mg by mouth 2 (two) times daily.       Marland Kitchen DISCONTD: Glucosamine-Chondroitin 1500-1200 MG/30ML LIQD NO INSTRUCTIONS GIVEN         Allergies  Allergen Reactions  . Penicillins     History   Social History  . Marital Status: Married    Spouse Name: Harriett Sine    Number of Children: 2  . Years of Education: N/A   Occupational History  . 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   Social History Main Topics  . Smoking status: Former Smoker    Quit date: 06/22/1984  . Smokeless tobacco: Never Used  . Alcohol Use: 3.5 oz/week    7 drink(s) per week     1 glass of wine a  day  . Drug Use: No  . Sexually Active: Not on file   Other Topics Concern  . Not on file   Social History Narrative   Health Care POA: wife, Harriett Sine RandalEmergency Contact: Thereasa Parkin, (c) [phone] of Life Plan: DNRWho lives with you: Lives with wife, Johnsie Cancel pets: coon hound, CookieDiet: Patient has a varied diet of protein, starch, vegetables.Exercise: Patient walks or does yoga 5 x a week.Seatbelts: Patient reports wearing a seat belt when in vehicle.Sun Exposure/Protection: Patient reports infrequent use of sun screen.Hobbies: Walking, running, politics    Family History  Problem Relation Age of Onset  . Cancer Father   . Diabetes Father     Review of Systems:  As stated in the HPI and otherwise negative.   BP 152/96  Pulse 61  Resp 18  Ht 6\' 1"  (1.854 m)  Wt 208 lb 6.4 oz (94.53 kg)  BMI 27.50 kg/m2  Physical Examination: General: Well developed, well nourished, NAD HEENT: OP clear, mucus membranes moist SKIN: warm, dry. No rashes. Neuro: No focal deficits Musculoskeletal: Muscle strength 5/5 all ext Psychiatric: Mood and affect normal Neck: No JVD, no carotid bruits, no thyromegaly, no  lymphadenopathy. Lungs:Clear bilaterally, no wheezes, rhonci, crackles Cardiovascular: Regular rate and rhythm. No murmurs, gallops or rubs. Abdomen:Soft. Bowel sounds present. Non-tender.  Extremities: No lower extremity edema. Pulses are 2 + in the bilateral DP/PT.  EKG:NSR, rate 60 bpm. Normal EKG.   CT chest 11/11 at [facility]: Cardiac enlargement with severe coronary artery calcification.

## 2010-07-13 NOTE — Patient Instructions (Signed)
Your physician recommends that you schedule a follow-up appointment in: 3 weeks with Dr. Clifton James  Your physician has requested that you have en exercise stress myoview. For further information please visit [email or web address] Please follow instruction sheet, as given.  Your physician has requested that you have an echocardiogram. Echocardiography is a painless test that uses sound waves to create images of your heart. It provides your doctor with information about the size and shape of your heart and how well your heart's chambers and valves are working. This procedure takes approximately one hour. There are no restrictions for this procedure.

## 2010-07-14 ENCOUNTER — Ambulatory Visit (INDEPENDENT_AMBULATORY_CARE_PROVIDER_SITE_OTHER): Payer: MEDICARE | Admitting: Sports Medicine

## 2010-07-14 ENCOUNTER — Encounter: Payer: Self-pay | Admitting: Sports Medicine

## 2010-07-14 DIAGNOSIS — M25569 Pain in unspecified knee: Secondary | ICD-10-CM

## 2010-07-14 DIAGNOSIS — M545 Low back pain: Secondary | ICD-10-CM

## 2010-07-14 DIAGNOSIS — M7750 Other enthesopathy of unspecified foot: Secondary | ICD-10-CM

## 2010-07-14 DIAGNOSIS — R209 Unspecified disturbances of skin sensation: Secondary | ICD-10-CM

## 2010-07-14 DIAGNOSIS — M25562 Pain in left knee: Secondary | ICD-10-CM

## 2010-07-14 MED ORDER — TRAMADOL HCL 50 MG PO TABS: 50.0000 mg | ORAL_TABLET | Freq: Two times a day (BID) | ORAL | Status: DC | PRN

## 2010-07-14 NOTE — Assessment & Plan Note (Signed)
-   Intermittent numbness in his feet, relatively well controlled. No other neurologic findings at this time. - Continue gabapentin as he is doing, no side effects at current dose. - Followup 6-8 weeks for reevaluation, sooner if needed. Reviewed red flags&  when to seek medical attention.

## 2010-07-14 NOTE — Assessment & Plan Note (Signed)
-   Well controlled at this time, may have some intermittent cord irritation from anteriolisthesis could be contributing to foot numbness. - Continue gabapentin tramadol as noted. - Followup 2-3 months for reevaluation, sooner if needed

## 2010-07-14 NOTE — Progress Notes (Signed)
  Subjective:    Patient ID: [name] [name], male    DOB: [birth year], 77 y.o.   MRN: [id number]  HPI 77 year old male to the office for followup of left knee pain, also with intermittent numbness of feet and lower extremity weakness. Left knee has been doing well since aspiration and injection by Dr. Dallas Schimke several weeks ago. No longer having any significant swelling. Denies any significant pain or instability. Tramadol as needed has been helpful.  Patient with history of low back pain and anterior listhesis with intermittent cord irritation. Has been noting intermittent great toe numbness of both feet. Denies any significant back pain with these episodes. Thought maybe related to previous history of Morton's neuroma, but feels slightly different. Has tried metatarsal cookies in his shoes which have been helpful, although they are greatly worn and is starting to have increasing symptoms as well as some pain along the metatarsals. He is having intermittent lower extremity weakness, but denies any instability. Denies any low back pain with these episodes. Denies any change in bowel or bladder. Denies any saddle anesthesia. Denies any fevers or chills.  Of note had recent evaluation by cardiologist yesterday and will be scheduled for a stress test in the near future   Review of Systems Per history of present illness, otherwise negative    Objective:   Physical Exam GENERAL: Alert and oriented x3, no acute distress, pleasant SKIN: No rashes or lesions MSK:  - Knees: Left knee with full range of motion 1+ crepitation, but without pain. 1+ synovitis, no effusion. Minimal tenderness palpation along the medial joint line, no lateral joint line tenderness. Negative McMurray's and no ligamentous laxity. Good quad and hamstring strength. Right knee with full range of motion with 1+ crepitation, no swelling, tenderness, weakness, or instability.  - Feet: Moderate longitudinal arch bilaterally. It is  noted to have collapse of transverse arch bilaterally with splaying between second and third toes of each feet. Mild tenderness to palpation along the plantar surface of the metatarsal heads 1-3 of both feet. Negative metatarsal squeeze  No tenderness along the plantar fascia or calcaneus.  - Back: Full range of motion without pain. No midline or paraspinal muscle tenderness. Negative straight leg raise bilaterally. Normal hip range of motion in hip strength bilaterally.  NEURO: Sensation intact to light touch, DTR +2/4 Achilles and patella bilaterally  VASCULAR: Pulses +2/4 and symmetric dorsalis pedis and posterior tibialis bilaterally        Assessment & Plan:

## 2010-07-14 NOTE — Assessment & Plan Note (Signed)
-   Pain greatly improved, no abnormalities noted on exam today. - Reviewed x-rays with patient during visit today which showed mild tricompartmental degenerative changes. - May continue activity as tolerated, suspect pain related to DJD versus degenerative meniscal tear. - Followup as needed for this.

## 2010-07-14 NOTE — Assessment & Plan Note (Signed)
-   Intermittent pain along the metatarsal heads of both feet with intermittent numbness along his great toes bilaterally. - Shoe insoles fitted with new metatarsal cookies today, as previous metatarsal cookies were well worn - Continue gabapentin and tramadol which may help this as well - Followup 2-3 months for reevaluation, return sooner if needed.

## 2010-07-18 ENCOUNTER — Encounter: Payer: Self-pay | Admitting: Cardiovascular Disease

## 2010-07-25 ENCOUNTER — Ambulatory Visit (HOSPITAL_COMMUNITY): Payer: Medicare Other | Attending: Cardiovascular Disease | Admitting: Radiology

## 2010-07-25 DIAGNOSIS — R072 Precordial pain: Secondary | ICD-10-CM

## 2010-07-25 DIAGNOSIS — R079 Chest pain, unspecified: Secondary | ICD-10-CM | POA: Insufficient documentation

## 2010-07-25 DIAGNOSIS — R011 Cardiac murmur, unspecified: Secondary | ICD-10-CM

## 2010-07-25 DIAGNOSIS — R0602 Shortness of breath: Secondary | ICD-10-CM

## 2010-07-25 MED ORDER — TECHNETIUM TC 99M TETROFOSMIN IV KIT
10.9000 | PACK | Freq: Once | INTRAVENOUS | Status: AC | PRN
  Administered 2010-07-25: 10.9 via INTRAVENOUS

## 2010-07-25 MED ORDER — TECHNETIUM TC 99M TETROFOSMIN IV KIT
33.0000 | PACK | Freq: Once | INTRAVENOUS | Status: AC | PRN
  Administered 2010-07-25: 33 via INTRAVENOUS

## 2010-07-25 NOTE — Progress Notes (Signed)
[facility] 3 NUCLEAR MED [address] [place] [address] [phone]  Cardiology Nuclear Med Study  [name] [name] is a 77 y.o. male [id number] 08-19-1930   Nuclear Med Background Indication for Stress Test:  Evaluation for Ischemia History: 11/11 CT/MRI with calcification and enlarged heart,07/25/10 Echo:EF=55-60% and Aortic trivial regurgitation and GXT approx 59yrs;nl per pt. Cardiac Risk Factors: History of Smoking,Hypertension and murmur  Symptoms:  Chest Pain (last date of chest discomfort 3-4 wks ago), Chest Pain with Exertion (last date of chest discomfort 3-4 wks ago) and lightheaded.   Nuclear Pre-Procedure Caffeine/Decaff Intake:  None NPO After: 7:30am   Lungs:  clear IV 0.9% NS with Angio Cath:  20g  IV Site: R Wrist  IV Started by:  Cathlyn Parsons, RN  Chest Size (in):  46 Cup Size: n/a  Height: 6\' 1"  (1.854 m)  Weight:  204 lb (92.534 kg)  BMI:  Body mass index is 26.91 kg/(m^2). Tech Comments:  No Viagra last pm    Nuclear Med Study 1 or 2 day study: 1 day  Stress Test Type:  Stress  Reading MD: Charlton Haws, MD  Order Authorizing Provider:  Landry Corporal  Resting Radionuclide: Technetium 8m Tetrofosmin  Resting Radionuclide Dose: 10.9 mCi   Stress Radionuclide:  Technetium 48m Tetrofosmin  Stress Radionuclide Dose: 33 mCi           Stress Protocol Rest HR: 58 Stress HR: 129  Rest BP: 109/80 Stress BP: 110/70  Exercise Time (min): 10:41 METS: 12.0   Predicted Max HR: 143 bpm % Max HR: 90.21 bpm Rate Pressure Product: 21308   Dose of Adenosine (mg):  n/a Dose of Lexiscan: n/a mg  Dose of Atropine (mg): n/a Dose of Dobutamine: n/a mcg/kg/min (at max HR)  Stress Test Technologist: Cathlyn Parsons, RN  Nuclear Technologist:  Domenic Polite, CNMT     Rest Procedure:  Myocardial perfusion imaging was performed at rest 45 minutes following the intravenous administration of Technetium 26m Tetrofosmin. Rest ECG:  Sinus Bradycardia  Stress Procedure:  The patient exercised for 10:59mins.  The patient stopped due to fatigue and denied any chest pain.  There were nonspecific  ST-T wave changes at beginning of recovery and resolved quickly. Patient BP steadily dropped during exercise but in recovery patient became Tachycardia(Juctional vs Atrial) of a HR 142-150 then covert back to sinus.  Also patients BP in recovery was 90/62 immediately then with tachycardia,patient BP increased to 145/81.  Patient had occasional PAC's and PVC's with stress test.  Patient results of stress discussed with Dr. Tenny Craw and patient ok to be discharged.  Technetium 36m Tetrofosmin was injected at peak exercise and myocardial perfusion imaging was performed after a brief delay. Stress ECG: No significant change from baseline ECG  QPS Raw Data Images:  Patient motion noted. Stress Images:  Apical thinning Rest Images:  Apical thinning Subtraction (SDS):  SDS 3 Transient Ischemic Dilatation (Normal <1.22):  1.02 Lung/Heart Ratio (Normal <0.45):  0.33  Quantitative Gated Spect Images QGS EDV:  158 ml QGS ESV:  78 ml QGS cine images:  NL LV Function; NL Wall Motion QGS EF: 51%  Impression Exercise Capacity:  Excellent exercise capacity. BP Response:  Blunted BP response Clinical Symptoms:  There is dyspnea. ECG Impression:  No significant ST segment change suggestive of ischemia. Comparison with Prior Nuclear Study: No previous nuclear study performed  Overall Impression:  Low risk stress nuclear study.  Possible small area of apical/inferoapical ischemia.  However Patient did 12 METS with normal ECG      Charlton Haws

## 2010-07-26 ENCOUNTER — Telehealth: Payer: Self-pay | Admitting: Cardiovascular Disease

## 2010-07-26 NOTE — Telephone Encounter (Signed)
Pt returned your call regarding results of test.

## 2010-07-26 NOTE — Progress Notes (Signed)
ROUTED TO DR.MCALHANY.Falecha Clark ° °

## 2010-07-27 NOTE — Telephone Encounter (Signed)
Pt rtn your call from yesterday and you can reach him at [phone]

## 2010-07-27 NOTE — Telephone Encounter (Signed)
Patient aware of echo results. We will call him once his stress test is read.

## 2010-08-01 NOTE — Progress Notes (Signed)
Stress test looks ok. Can we let him know? Thanks (I think he had an echo too) chris

## 2010-08-02 NOTE — Progress Notes (Signed)
Patient is aware of test/lab results.  

## 2010-08-04 ENCOUNTER — Encounter: Payer: Self-pay | Admitting: Cardiovascular Disease

## 2010-08-04 ENCOUNTER — Ambulatory Visit (INDEPENDENT_AMBULATORY_CARE_PROVIDER_SITE_OTHER): Payer: Medicare Other | Admitting: Cardiovascular Disease

## 2010-08-04 DIAGNOSIS — R079 Chest pain, unspecified: Secondary | ICD-10-CM

## 2010-08-04 NOTE — Assessment & Plan Note (Signed)
NO evidence of ischemia on stress test. Good exercise tolerance. No further w/u. He will call if he has recurrent chest pain.

## 2010-08-04 NOTE — Progress Notes (Signed)
History of Present Illness:77 yo WM with history of HTN, BPH, former tobacco abuse (stopped 1985), OA here today for cardiac follow up. I saw him three weeks ago to  establish cardiology care. He gets his medications at the [facility] in [place]. His primary care is with [facility] Primary care Dr. Tenny Craw. He told me that he had a CT scan of the chest in November 2011 for f/u of pulmonary nodules. His coronary arteries showed calcification. He described exertional chest pain at our first visit. I ordered an echo and  An exercise stress myoview. His myoview showed normal perfusion with small apical defect. He exercised for 12 minutes with no chest pain and no EKG changes. Echo with normal LV function, biatrial enlargement, no significant valvular issues.   He feels well. No chest pain over last two weeks.    Past Medical History  Diagnosis Date  . Ramsay Hunt auricular syndrome   . HTN (hypertension)   . Osteoarthritis     Past Surgical History  Procedure Date  . Hernia repair   . Vasectomy   . Prostate biopsies     per Vonita Moss  . Cataract extraction     SE eye    Current Outpatient Prescriptions  Medication Sig Dispense Refill  . aspirin ([employer] ASPIRIN EC) 81 MG EC tablet Take 81 mg by mouth daily.        . Cholecalciferol (VITAMIN D-3 PO) Take 5,000 Units by mouth daily.        Marland Kitchen diltiazem (CARDIZEM CD) 120 MG 24 hr capsule Take 120 mg by mouth daily.       Marland Kitchen gabapentin (NEURONTIN) 600 MG tablet Take 600 mg by mouth 2 (two) times daily.       . hydrochlorothiazide 25 MG tablet Take 25 mg by mouth every morning.       . Multiple Vitamins-Minerals (OPTIVITE) TABS NO INSTRUCTIONS GIVEN       . Omega-3 Fatty Acids (FISH OIL) 1000 MG CAPS Take 1 capsule by mouth 2 (two) times daily.       Marland Kitchen omeprazole (PRILOSEC) 20 MG capsule Take 1 capsule by mouth daily.       Marland Kitchen pyridOXINE (VITAMIN B-6) 100 MG tablet Take 100 mg by mouth daily.        . Selenium 100 MCG TABS Take 2 tablets by mouth daily.       .  sildenafil (VIAGRA) 100 MG tablet Take 100 mg by mouth as needed.       . terazosin (HYTRIN) 5 MG capsule Take 5 mg by mouth at bedtime.       . traMADol (ULTRAM) 50 MG tablet Take 1 tablet (50 mg total) by mouth 2 (two) times daily as needed.  60 tablet  1  . zinc gluconate 50 MG tablet Take 50 mg by mouth daily.          Allergies  Allergen Reactions  . Penicillins     History   Social History  . Marital Status: Married    Spouse Name: Harriett Sine    Number of Children: 2  . Years of Education: N/A   Occupational History  . 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   Social History Main Topics  . Smoking status: Former Smoker    Quit date: 06/22/1984  . Smokeless tobacco: Never Used  . Alcohol Use: 3.5 oz/week    7 drink(s) per week     1 glass of wine a day  .  Drug Use: No  . Sexually Active: Not on file   Other Topics Concern  . Not on file   Social History Narrative   Health Care POA: wife, Harriett Sine RandalEmergency Contact: Thereasa Parkin, (c) [phone] of Life Plan: DNRWho lives with you: Lives with wife, Johnsie Cancel pets: coon hound, CookieDiet: Patient has a varied diet of protein, starch, vegetables.Exercise: Patient walks or does yoga 5 x a week.Seatbelts: Patient reports wearing a seat belt when in vehicle.Sun Exposure/Protection: Patient reports infrequent use of sun screen.Hobbies: Walking, running, politics    Family History  Problem Relation Age of Onset  . Cancer Father   . Diabetes Father     Review of Systems:  As stated in the HPI and otherwise negative.   BP 112/66  Pulse 80  Ht 6\' 1"  (1.854 m)  Wt 206 lb (93.441 kg)  BMI 27.18 kg/m2  Physical Examination: General: Well developed, well nourished, NAD HEENT: OP clear, mucus membranes moist SKIN: warm, dry. No rashes. Neuro: No focal deficits Musculoskeletal: Muscle strength 5/5 all ext Psychiatric: Mood and affect normal Neck: No JVD, no carotid bruits, no thyromegaly, no  lymphadenopathy. Lungs:Clear bilaterally, no wheezes, rhonci, crackles Cardiovascular: Regular rate and rhythm. No murmurs, gallops or rubs. Abdomen:Soft. Bowel sounds present. Non-tender.  Extremities: No lower extremity edema. Pulses are 2 + in the bilateral DP/PT.  Echo: 07/25/10:Left ventricle: The cavity size was normal. Wall thickness was normal. Systolic function was normal. The estimated ejection fraction was in the range of 55% to 60%. Wall motion was normal; there were no regional wall motion abnormalities. Doppler parameters are consistent with abnormal left ventricular relaxation (grade 1 diastolic dysfunction). - Aortic valve: Trivial regurgitation. - Left atrium: The atrium was moderately dilated. - Right atrium: The atrium was mildly to moderately dilated. - Atrial septum: The septum bowed from right to left, consistent with increased right atrial pressure. A patent foramen ovale cannot be excluded.  Stress myoview: 07/25/10: Exercise Capacity: Excellent exercise capacity.  BP Response: Blunted BP response  Clinical Symptoms: There is dyspnea.  ECG Impression: No significant ST segment change suggestive of ischemia.  Comparison with Prior Nuclear Study: No previous nuclear study performed  Overall Impression: Low risk stress nuclear study. Possible small area of apical/inferoapical ischemia. However Patient did 12 METS with normal ECG

## 2010-08-05 NOTE — Procedures (Signed)
[facility]  Patient:    REISE, GLADNEY Visit Number: [id number] MRN: [id number]          Service Type: END Location: ENDO Attending Physician:  Louie Bun Proc. Date: 12/18/00 Admit Date:  12/18/2000   CC:         Charles A. Tenny Craw, M.D.   Procedure Report  PROCEDURE:  Colonoscopy.  INDICATION FOR PROCEDURE:  Screening colonoscopy in a 77 year old patient.  DESCRIPTION OF PROCEDURE:  The patient was placed in the left lateral decubitus position and placed on the pulse monitor with continuous low-flow oxygen delivered by nasal cannula.  He was sedated with 70 mg IV Demerol and 7 mg IV Versed.  The Olympus video colonoscope was inserted into the rectum and advanced to the cecum, confirmed by transillumination at McBurneys point and visualization of the ileocecal valve and appendiceal orifice.  The prep was excellent.  The cecum, ascending, and transverse colon appeared normal with no masses, polyps, diverticula, or other mucosal abnormalities.  Within the descending and sigmoid colon, there were seen a few scattered diverticula and no other abnormalities.  The rectum appeared normal, and retroflex view of the anus no obvious internal hemorrhoids.  The colonoscope was then withdrawn and the patient returned to the recovery room in stable condition.  He tolerated the procedure well, and there were no immediate complications.  IMPRESSION: 1. Diverticulosis. 2. Otherwise normal colonoscopy. Attending Physician:  Louie Bun DD:  12/18/00 TD:  12/18/00 Job: [id number]

## 2010-08-17 ENCOUNTER — Encounter: Payer: Self-pay | Admitting: Cardiovascular Disease

## 2010-08-30 ENCOUNTER — Encounter: Payer: Self-pay | Admitting: Cardiovascular Disease

## 2010-09-15 ENCOUNTER — Other Ambulatory Visit: Payer: Self-pay | Admitting: Family Medicine

## 2010-10-28 ENCOUNTER — Other Ambulatory Visit: Payer: Self-pay | Admitting: Sports Medicine

## 2010-12-15 ENCOUNTER — Emergency Department (HOSPITAL_COMMUNITY): Payer: Medicare Other

## 2010-12-15 ENCOUNTER — Emergency Department (HOSPITAL_COMMUNITY)
Admission: EM | Admit: 2010-12-15 | Discharge: 2010-12-15 | Disposition: A | Discharge status: Home / Self Care | Payer: Medicare Other | Attending: Emergency Medicine | Admitting: Emergency Medicine

## 2010-12-15 DIAGNOSIS — K219 Gastro-esophageal reflux disease without esophagitis: Secondary | ICD-10-CM | POA: Insufficient documentation

## 2010-12-15 DIAGNOSIS — I10 Essential (primary) hypertension: Secondary | ICD-10-CM | POA: Insufficient documentation

## 2010-12-15 DIAGNOSIS — R109 Unspecified abdominal pain: Secondary | ICD-10-CM | POA: Insufficient documentation

## 2010-12-15 DIAGNOSIS — R079 Chest pain, unspecified: Secondary | ICD-10-CM | POA: Insufficient documentation

## 2010-12-15 LAB — BASIC METABOLIC PANEL
Chloride: 99 mEq/L (ref 96–112)
GFR calc Af Amer: >60 mL/min (ref >60)
Potassium: 3.8 mEq/L (ref 3.5–5.1)
Sodium: 133 mEq/L — ABNORMAL LOW (ref 135–145)

## 2010-12-15 LAB — DIFFERENTIAL
Basophils Absolute: 0 10*3/uL (ref 0.0–0.1)
Basophils Relative: 0 % (ref 0–1)
Monocytes Relative: 12 % (ref 3–12)
Neutro Abs: 3.6 10*3/uL (ref 1.7–7.7)
Neutrophils Relative %: 57 % (ref 43–77)

## 2010-12-15 LAB — POCT I-STAT TROPONIN I: Troponin i, poc: 0 ng/mL (ref 0.00–0.08)

## 2010-12-15 LAB — CBC
Platelets: 238 10*3/uL (ref 150–400)
RBC: 3.81 MIL/uL — ABNORMAL LOW (ref 4.22–5.81)
RDW: 12.6 % (ref 11.5–15.5)
WBC: 6.3 10*3/uL (ref 4.0–10.5)

## 2011-01-23 ENCOUNTER — Other Ambulatory Visit: Payer: Self-pay | Admitting: Gastroenterology

## 2011-01-25 ENCOUNTER — Ambulatory Visit
Admission: RE | Admit: 2011-01-25 | Discharge: 2011-01-25 | Disposition: A | Discharge status: Home / Self Care | Payer: Medicare Other | Source: Ambulatory Visit | Attending: Gastroenterology | Admitting: Gastroenterology

## 2011-02-03 ENCOUNTER — Encounter: Payer: Self-pay | Admitting: Cardiovascular Disease

## 2011-02-03 ENCOUNTER — Ambulatory Visit (INDEPENDENT_AMBULATORY_CARE_PROVIDER_SITE_OTHER): Payer: Medicare Other | Admitting: Cardiovascular Disease

## 2011-02-03 DIAGNOSIS — R079 Chest pain, unspecified: Secondary | ICD-10-CM

## 2011-02-03 NOTE — Assessment & Plan Note (Signed)
Atypical chest pains. Stress test this year is normal. He has plans for a GI w/u next month. I will see him back in 2 months after the GI w/u. If negative, we will likely pursue a cardiac cath to exclude CAD.

## 2011-02-03 NOTE — Progress Notes (Signed)
History of Present Illness:77 yo WM with history of HTN, BPH, former tobacco abuse (stopped 1985), OA here today for cardiac follow up. I saw him in April 2012 to establish cardiology care. He gest some of his medications at the [facility] in [place]. His primary care is with [facility] Primary care Dr. Tenny Craw. He told me that he had a CT scan of the chest in November 2011 for f/u of pulmonary nodules. His coronary arteries showed calcification. He described exertional chest pain at our first visit. I ordered an echo and An exercise stress myoview. His myoview showed normal perfusion with small apical defect. He exercised for 12 minutes with no chest pain and no EKG changes. Echo with normal LV function, biatrial enlargement, no significant valvular issues. I saw him in May and recommended conservative approach as he was feeling well and having no chest pain.   He is back today for 6 month follow up. He feels well. He describes an occasional "twitch" in his chest. This is usually when his is under stress. He also feels that his legs are week. No exertional chest pain. He was seen in the ED 6 weeks ago for chest pain and had negative tests. He has seen his primary doctor and has plans for an EGD soon. No SOB. No other complaints.    Past Medical History  Diagnosis Date  . Ramsay Hunt auricular syndrome   . HTN (hypertension)   . Osteoarthritis     Past Surgical History  Procedure Date  . Hernia repair   . Vasectomy   . Prostate biopsies     per Vonita Moss  . Cataract extraction     SE eye    Current Outpatient Prescriptions  Medication Sig Dispense Refill  . aspirin ([employer] ASPIRIN EC) 81 MG EC tablet Take 81 mg by mouth daily.        . Cholecalciferol (VITAMIN D-3 PO) Take 5,000 Units by mouth daily.        Marland Kitchen diltiazem (CARDIZEM CD) 120 MG 24 hr capsule Take 120 mg by mouth daily.       Marland Kitchen gabapentin (NEURONTIN) 600 MG tablet        . hydrochlorothiazide 25 MG tablet Take 25 mg by mouth every morning.       .  Multiple Vitamins-Minerals (OPTIVITE) TABS NO INSTRUCTIONS GIVEN       . Omega-3 Fatty Acids (FISH OIL) 1000 MG CAPS Take 1 capsule by mouth 2 (two) times daily.       Marland Kitchen omeprazole (PRILOSEC) 20 MG capsule Take 1 capsule by mouth daily.       Marland Kitchen pyridOXINE (VITAMIN B-6) 100 MG tablet Take 100 mg by mouth daily.        . Selenium 100 MCG TABS Take 2 tablets by mouth daily.       . sildenafil (VIAGRA) 100 MG tablet Take 100 mg by mouth as needed.       . terazosin (HYTRIN) 5 MG capsule Take 5 mg by mouth at bedtime.       Marland Kitchen zinc gluconate 50 MG tablet Take 50 mg by mouth daily.        Marland Kitchen DISCONTD: gabapentin (NEURONTIN) 600 MG tablet TAKE ONE TABLET BY MOUTH THREE TIMES DAILY  90 tablet  6    Allergies  Allergen Reactions  . Penicillins     History   Social History  . Marital Status: Married    Spouse Name: Harriett Sine    Number of Children: 2  .  Years of Education: N/A   Occupational History  . 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   Social History Main Topics  . Smoking status: Former Smoker    Quit date: 06/22/1984  . Smokeless tobacco: Never Used  . Alcohol Use: 3.5 oz/week    7 drink(s) per week     1 glass of wine a day  . Drug Use: No  . Sexually Active: Not on file   Other Topics Concern  . Not on file   Social History Narrative   Health Care POA: wife, Harriett Sine RandalEmergency Contact: Thereasa Parkin, (c) [phone] of Life Plan: DNRWho lives with you: Lives with wife, Johnsie Cancel pets: coon hound, CookieDiet: Patient has a varied diet of protein, starch, vegetables.Exercise: Patient walks or does yoga 5 x a week.Seatbelts: Patient reports wearing a seat belt when in vehicle.Sun Exposure/Protection: Patient reports infrequent use of sun screen.Hobbies: Walking, running, politics    Family History  Problem Relation Age of Onset  . Cancer Father   . Diabetes Father     Review of Systems:  As stated in the HPI and otherwise negative.   BP 134/83  Pulse  60  Ht 6\' 1"  (1.854 m)  Wt 211 lb (95.709 kg)  BMI 27.84 kg/m2  Physical Examination: General: Well developed, well nourished, NAD HEENT: OP clear, mucus membranes moist SKIN: warm, dry. No rashes. Neuro: No focal deficits Musculoskeletal: Muscle strength 5/5 all ext Psychiatric: Mood and affect normal Neck: No JVD, no carotid bruits, no thyromegaly, no lymphadenopathy. Lungs:Clear bilaterally, no wheezes, rhonci, crackles Cardiovascular: Regular rate and rhythm. No murmurs, gallops or rubs. Abdomen:Soft. Bowel sounds present. Non-tender.  Extremities: No lower extremity edema. Pulses are 2 + in the bilateral DP/PT.

## 2011-02-03 NOTE — Patient Instructions (Signed)
Your physician recommends that you schedule a follow-up appointment in: 2 months.  Your physician recommends that you continue on your current medications as directed. Please refer to the Current Medication list given to you today.  

## 2011-03-12 ENCOUNTER — Inpatient Hospital Stay (HOSPITAL_COMMUNITY): Payer: Medicare Other

## 2011-03-12 ENCOUNTER — Emergency Department (HOSPITAL_COMMUNITY): Payer: Medicare Other

## 2011-03-12 ENCOUNTER — Encounter (HOSPITAL_COMMUNITY): Payer: Self-pay

## 2011-03-12 ENCOUNTER — Other Ambulatory Visit: Payer: Self-pay

## 2011-03-12 ENCOUNTER — Inpatient Hospital Stay (HOSPITAL_COMMUNITY)
Admission: EM | Admit: 2011-03-12 | Discharge: 2011-03-13 | DRG: 065 | Disposition: A | Discharge status: Home / Self Care | Payer: Medicare Other | Attending: Internal Medicine | Admitting: Internal Medicine

## 2011-03-12 DIAGNOSIS — N179 Acute kidney failure, unspecified: Secondary | ICD-10-CM | POA: Diagnosis present

## 2011-03-12 DIAGNOSIS — R29898 Other symptoms and signs involving the musculoskeletal system: Secondary | ICD-10-CM | POA: Diagnosis present

## 2011-03-12 DIAGNOSIS — I10 Essential (primary) hypertension: Secondary | ICD-10-CM | POA: Diagnosis present

## 2011-03-12 DIAGNOSIS — Z79899 Other long term (current) drug therapy: Secondary | ICD-10-CM

## 2011-03-12 DIAGNOSIS — I6350 Cerebral infarction due to unspecified occlusion or stenosis of unspecified cerebral artery: Principal | ICD-10-CM | POA: Diagnosis present

## 2011-03-12 DIAGNOSIS — M1990 Unspecified osteoarthritis, unspecified site: Secondary | ICD-10-CM | POA: Diagnosis present

## 2011-03-12 DIAGNOSIS — I639 Cerebral infarction, unspecified: Secondary | ICD-10-CM | POA: Diagnosis present

## 2011-03-12 DIAGNOSIS — Z7982 Long term (current) use of aspirin: Secondary | ICD-10-CM

## 2011-03-12 DIAGNOSIS — B0221 Postherpetic geniculate ganglionitis: Secondary | ICD-10-CM | POA: Diagnosis present

## 2011-03-12 LAB — CBC
HCT: 38.3 % — ABNORMAL LOW (ref 39.0–52.0)
Hemoglobin: 13.8 g/dL (ref 13.0–17.0)
MCH: 33.7 pg (ref 26.0–34.0)
MCHC: 34.5 g/dL (ref 30.0–36.0)
MCHC: 35 g/dL (ref 30.0–36.0)
MCV: 98.5 fL (ref 78.0–100.0)
RDW: 12.9 % (ref 11.5–15.5)
RDW: 12.9 % (ref 11.5–15.5)

## 2011-03-12 LAB — POCT I-STAT, CHEM 8
BUN: 21 mg/dL (ref 6–23)
Chloride: 103 mEq/L (ref 96–112)
HCT: 40 % (ref 39.0–52.0)
Potassium: 4.4 mEq/L (ref 3.5–5.1)
Sodium: 142 mEq/L (ref 135–145)

## 2011-03-12 LAB — COMPREHENSIVE METABOLIC PANEL
Albumin: 3.8 g/dL (ref 3.5–5.2)
Alkaline Phosphatase: 103 U/L (ref 39–117)
BUN: 20 mg/dL (ref 6–23)
Creatinine, Ser: 1.13 mg/dL (ref 0.50–1.35)
Potassium: 3.8 mEq/L (ref 3.5–5.1)
Total Protein: 6.9 g/dL (ref 6.0–8.3)

## 2011-03-12 LAB — MAGNESIUM: Magnesium: 1.9 mg/dL (ref 1.5–2.5)

## 2011-03-12 LAB — PHOSPHORUS: Phosphorus: 3 mg/dL (ref 2.3–4.6)

## 2011-03-12 LAB — DIFFERENTIAL
Basophils Relative: 0 % (ref 0–1)
Eosinophils Absolute: 0.3 10*3/uL (ref 0.0–0.7)
Monocytes Relative: 13 % — ABNORMAL HIGH (ref 3–12)
Neutrophils Relative %: 52 % (ref 43–77)

## 2011-03-12 LAB — PROTIME-INR
INR: 0.95 (ref 0.00–1.49)
Prothrombin Time: 12.9 seconds (ref 11.6–15.2)

## 2011-03-12 LAB — CREATININE, SERUM
Creatinine, Ser: 1.09 mg/dL (ref 0.50–1.35)
GFR calc Af Amer: 73 mL/min — ABNORMAL LOW (ref >90)

## 2011-03-12 LAB — TROPONIN I: Troponin I: <0.3 ng/mL (ref <0.30)

## 2011-03-12 LAB — GLUCOSE, CAPILLARY: Glucose-Capillary: 103 mg/dL — ABNORMAL HIGH (ref 70–99)

## 2011-03-12 MED ORDER — ACETAMINOPHEN 650 MG RE SUPP: 650.0000 mg | Freq: Four times a day (QID) | RECTAL | Status: DC | PRN

## 2011-03-12 MED ORDER — HYDROCODONE-ACETAMINOPHEN 5-325 MG PO TABS: 1.0000 | ORAL_TABLET | ORAL | Status: DC | PRN

## 2011-03-12 MED ORDER — ROSUVASTATIN CALCIUM 10 MG PO TABS
10.0000 mg | ORAL_TABLET | Freq: Every day | ORAL | Status: DC
  Administered 2011-03-12: 10 mg via ORAL
  Filled 2011-03-12 (×2): qty 1

## 2011-03-12 MED ORDER — ONDANSETRON HCL 4 MG/2ML IJ SOLN: 4.0000 mg | Freq: Four times a day (QID) | INTRAMUSCULAR | Status: DC | PRN

## 2011-03-12 MED ORDER — OMEGA-3-ACID ETHYL ESTERS 1 G PO CAPS
1.0000 g | ORAL_CAPSULE | Freq: Every day | ORAL | Status: DC
  Administered 2011-03-12 – 2011-03-13 (×2): 1 g via ORAL
  Filled 2011-03-12 (×2): qty 1

## 2011-03-12 MED ORDER — ALUM & MAG HYDROXIDE-SIMETH 200-200-20 MG/5ML PO SUSP: 30.0000 mL | Freq: Four times a day (QID) | ORAL | Status: DC | PRN

## 2011-03-12 MED ORDER — ONDANSETRON HCL 4 MG PO TABS: 4.0000 mg | ORAL_TABLET | Freq: Four times a day (QID) | ORAL | Status: DC | PRN

## 2011-03-12 MED ORDER — TERAZOSIN HCL 5 MG PO CAPS
5.0000 mg | ORAL_CAPSULE | Freq: Every day | ORAL | Status: DC
  Administered 2011-03-12: 5 mg via ORAL
  Filled 2011-03-12 (×2): qty 1

## 2011-03-12 MED ORDER — HYDROCHLOROTHIAZIDE 25 MG PO TABS
25.0000 mg | ORAL_TABLET | Freq: Every day | ORAL | Status: DC
  Filled 2011-03-12: qty 1

## 2011-03-12 MED ORDER — ENOXAPARIN SODIUM 30 MG/0.3ML ~~LOC~~ SOLN
30.0000 mg | SUBCUTANEOUS | Status: DC
  Administered 2011-03-12: 30 mg via SUBCUTANEOUS
  Filled 2011-03-12 (×3): qty 0.3

## 2011-03-12 MED ORDER — ACETAMINOPHEN 325 MG PO TABS: 650.0000 mg | ORAL_TABLET | Freq: Four times a day (QID) | ORAL | Status: DC | PRN

## 2011-03-12 MED ORDER — GABAPENTIN 600 MG PO TABS
600.0000 mg | ORAL_TABLET | Freq: Two times a day (BID) | ORAL | Status: DC
  Administered 2011-03-12 – 2011-03-13 (×3): 600 mg via ORAL
  Filled 2011-03-12 (×4): qty 1

## 2011-03-12 MED ORDER — PANTOPRAZOLE SODIUM 40 MG PO TBEC
40.0000 mg | DELAYED_RELEASE_TABLET | Freq: Every day | ORAL | Status: DC
  Administered 2011-03-12 – 2011-03-13 (×2): 40 mg via ORAL
  Filled 2011-03-12 (×2): qty 1

## 2011-03-12 MED ORDER — SODIUM CHLORIDE 0.9 % IV SOLN
INTRAVENOUS | Status: DC
  Administered 2011-03-12 – 2011-03-13 (×2): via INTRAVENOUS

## 2011-03-12 MED ORDER — OMEGA-3-ACID ETHYL ESTERS 1 G PO CAPS
1.0000 g | ORAL_CAPSULE | Freq: Every day | ORAL | Status: DC
  Administered 2011-03-12: 1 g via ORAL

## 2011-03-12 MED ORDER — CLOPIDOGREL BISULFATE 75 MG PO TABS
75.0000 mg | ORAL_TABLET | Freq: Every day | ORAL | Status: DC
Start: 2011-03-13 — End: 2011-03-13
  Administered 2011-03-13: 75 mg via ORAL
  Filled 2011-03-12 (×2): qty 1

## 2011-03-12 MED ORDER — DILTIAZEM HCL ER COATED BEADS 120 MG PO CP24
120.0000 mg | ORAL_CAPSULE | Freq: Every day | ORAL | Status: DC
  Administered 2011-03-13: 120 mg via ORAL
  Filled 2011-03-12 (×3): qty 1

## 2011-03-12 NOTE — ED Notes (Signed)
pts wife noticed about one hour ago that he has weakness and numbness on his right side, speech is normal and he has a droop on the left side of his face from an earlier condition,

## 2011-03-12 NOTE — Progress Notes (Addendum)
Subjective: No complaints. Objective: Filed Vitals:   03/12/11 1145 03/12/11 1200 03/12/11 1300 03/12/11 1438  BP: 134/77 147/84  136/78  Pulse: 60 64  60  Temp: 97.5 F (36.4 C)   97.8 F (36.6 C)  TempSrc: Oral   Oral  Resp: 20 18  18   Height:   6\' 1"  (1.854 m)   Weight:   92.987 kg (205 lb)   SpO2: 94% 99%  94%   Weight change:  No intake or output data in the 24 hours ending 03/12/11 1547  General: Alert, awake, oriented x3, in no acute distress.  HEENT: No bruits, no goiter.  Heart: Regular rate and rhythm, without murmurs, rubs, gallops.  Lungs: Good air movement and clear to auscultation Abdomen: Soft, nontender, nondistended, positive bowel sounds.  Neuro: He is alert and oriented x3 coherent for language is left arm has a strength of 4/5 of his other extremities are 5 out of 5 he does have some facial symmetry on the right side of his face.   Lab Results:  [facility] 03/12/11 0950 03/12/11 0840  NA 142 138  K 4.4 3.8  CL 103 102  CO2 -- 29  GLUCOSE 105* 107*  BUN 21 20  CREATININE 1.40* 1.13  CALCIUM -- 9.9  MG -- --  PHOS -- --    Basename 03/12/11 0840  AST 22  ALT 14  ALKPHOS 103  BILITOT 1.3*  PROT 6.9  ALBUMIN 3.8    Basename 03/12/11 0950 03/12/11 0840  WBC -- 5.8  NEUTROABS -- 3.0  HGB 13.6 13.8  HCT 40.0 40.0  MCV -- 97.8  PLT -- 233    Basename 03/12/11 0840  CKTOTAL --  CKMB --  CKMBINDEX --  TROPONINI <0.30    Micro Results: No results found for this or any previous visit (from the past 240 hour(s)).  Studies/Results: Ct Head Wo Contrast  03/12/2011  *RADIOLOGY REPORT*  Clinical Data: Code stroke.  Right upper extremity weakness.  CT HEAD WITHOUT CONTRAST  Technique:  Contiguous axial images were obtained from the base of the skull through the vertex without contrast.  Comparison: None.  Findings: There is atrophy and chronic small vessel disease changes. No acute intracranial abnormality.  Specifically, no hemorrhage,  hydrocephalus, mass lesion, acute infarction, or significant intracranial injury.  No acute calvarial abnormality.  Extensive disease within the left maxillary sinus.  Remainder of the paranasal sinuses are clear as are the mastoids.  IMPRESSION: No acute intracranial abnormality.  Atrophy, chronic microvascular disease.  Critical Value/emergent results were called by telephone at the time of interpretation on 03/12/2011  at 8:45 a.m.  to  Dr. Lynelle Doctor, who verbally acknowledged these results.  Original Report Authenticated By: Cyndie Chime, M.D.   Mr Angiogram Head Wo Contrast  03/12/2011  *RADIOLOGY REPORT*  Clinical Data:  Acute stroke  MRI HEAD WITHOUT CONTRAST MRA HEAD WITHOUT CONTRAST  Technique:  Multiplanar, multiecho pulse sequences of the brain and surrounding structures were obtained without intravenous contrast. Angiographic images of the head were obtained using MRA technique without contrast.  Comparison:  CT 03/12/2011  MRI HEAD  Findings:  Small area of probable restricted diffusion in the left parietal subcortical white matter and cortex.  This is most likely an area of recent infarction.  Generalized atrophy.  Chronic ischemic changes in the white matter bilaterally.  Infarct in the left frontal cortex is small and appears chronic.  Brainstem is normal.  Small area of chronic infarction left cerebellum.  Negative for intracranial hemorrhage.  Negative for mass or edema. Negative for hydrocephalus.  Sinusitis with extensive mucosal thickening left maxillary sinus and mild mucosal thickening right maxillary sinus.  Mastoid sinus effusions bilaterally.  IMPRESSION: Area of  restricted diffusion left parietal lobe compatible with a small area of acute infarction.  Atrophy and chronic ischemic changes in the white matter and left frontal cortex.  MRA HEAD  Findings: Both vertebral arteries are patent to the basilar. Basilar is widely patent.  PICA, superior cerebellar, and posterior cerebral arteries  are widely patent without stenosis.  Internal carotid artery is patent bilaterally.  Anterior and middle cerebral arteries are patent bilaterally.  Negative for cerebral aneurysm.  IMPRESSION: No significant intracranial stenosis.  Original Report Authenticated By: Camelia Phenes, M.D.   Mr Brain Wo Contrast  03/12/2011  *RADIOLOGY REPORT*  Clinical Data:  Acute stroke  MRI HEAD WITHOUT CONTRAST MRA HEAD WITHOUT CONTRAST  Technique:  Multiplanar, multiecho pulse sequences of the brain and surrounding structures were obtained without intravenous contrast. Angiographic images of the head were obtained using MRA technique without contrast.  Comparison:  CT 03/12/2011  MRI HEAD  Findings:  Small area of probable restricted diffusion in the left parietal subcortical white matter and cortex.  This is most likely an area of recent infarction.  Generalized atrophy.  Chronic ischemic changes in the white matter bilaterally.  Infarct in the left frontal cortex is small and appears chronic.  Brainstem is normal.  Small area of chronic infarction left cerebellum.  Negative for intracranial hemorrhage.  Negative for mass or edema. Negative for hydrocephalus.  Sinusitis with extensive mucosal thickening left maxillary sinus and mild mucosal thickening right maxillary sinus.  Mastoid sinus effusions bilaterally.  IMPRESSION: Area of  restricted diffusion left parietal lobe compatible with a small area of acute infarction.  Atrophy and chronic ischemic changes in the white matter and left frontal cortex.  MRA HEAD  Findings: Both vertebral arteries are patent to the basilar. Basilar is widely patent.  PICA, superior cerebellar, and posterior cerebral arteries are widely patent without stenosis.  Internal carotid artery is patent bilaterally.  Anterior and middle cerebral arteries are patent bilaterally.  Negative for cerebral aneurysm.  IMPRESSION: No significant intracranial stenosis.  Original Report Authenticated By: Camelia Phenes, M.D.    Medications: I have reviewed the patient's current medications.   Active Problems:  CVA (cerebral infarction)  HTN (hypertension), malignant  Acute kidney injury    Assessment and plan: -MRI showed:Area of restricted diffusion left parietal lobe compatible with a small area of acute infarction. Blood pressure currently well controlled: Hemoglobin A1c pending, continue statin, Plavix, 2-D echo and carotid Dopplers are pending. Physical therapy recommendations are pending at this time.  -Continue current medications.  -Renal function has worsened overnight creatinine 1 from a normal of 1.1 1.4 his blood pressure is unchanged. He is on hydrochlorothiazide but no other medication that would affect his renal function. We'll recheck his basic metabolic panel in the morning. Hold hydrochlorothiazide.   LOS: 0 days   Marinda Elk M.D. Pager: [phone] [facility] 03/12/2011, 3:47 PM

## 2011-03-12 NOTE — H&P (Signed)
PCP:   Miguel Aschoff, MD   Chief Complaint:  Weakness right side since this morning.  HPI: Mr Rumbold is an extremely pleasant male who is quite active, has htn, was following cards outpatient for chest pain on and off. He comes in with right sided arm and leg weakness which started around 730am, after showering. The arm and leg both feel heavy, but he says his grip is improving. Other than today's developments, patient had been doing ok serve for some chest pain being followed by Dr Stana Bunting. ? Mention of cardiac cath. Patient did not have prodromal symptoms today- no headache or dizziness, no facial droop. An MRI brain has since confirmed an area of restricted diffusion left parietal lobe compatible with a small area of acute infarction. Patient was on ASA. He has been seen by Dr Thad Ranger who recommended stroke work up and plavix. Input appreciated.  Review of Systems:  The patient denies anorexia, fever, weight loss,, vision loss, decreased hearing, hoarseness, chest pain, syncope, dyspnea on exertion, peripheral edema, balance deficits, hemoptysis, abdominal pain, melena, hematochezia, severe indigestion/heartburn, hematuria, incontinence, genital sores,suspicious skin lesions, transient blindness, difficulty walking, depression, unusual weight change, abnormal bleeding, enlarged lymph nodes, angioedema, and breast masses.  Past Medical History: Past Medical History  Diagnosis Date  . Ramsay Hunt auricular syndrome   . HTN (hypertension)   . Osteoarthritis    Past Surgical History  Procedure Date  . Hernia repair   . Vasectomy   . Prostate biopsies     per Vonita Moss  . Cataract extraction     SE eye    Medications: Prior to Admission medications   Medication Sig Start Date End Date Taking? Authorizing Provider  aspirin ([employer] ASPIRIN EC) 81 MG EC tablet Take 81 mg by mouth every morning.    Yes Historical Provider, MD  Cholecalciferol (VITAMIN D-3 PO) Take 5,000 Units by mouth daily.      Yes Historical Provider, MD  diltiazem (CARDIZEM CD) 120 MG 24 hr capsule Take 120 mg by mouth every morning.    Yes Historical Provider, MD  gabapentin (NEURONTIN) 600 MG tablet 600 mg 2 (two) times daily.  10/28/10  Yes Enid Baas, MD  hydrochlorothiazide 25 MG tablet Take 25 mg by mouth every morning.    Yes Historical Provider, MD  Multiple Vitamins-Minerals (PRESERVISION AREDS PO) Take 1 tablet by mouth 2 (two) times daily.     Yes Historical Provider, MD  Omega-3 Fatty Acids (FISH OIL) 1200 MG CAPS Take 1 capsule by mouth every morning.     Yes Historical Provider, MD  omeprazole (PRILOSEC) 20 MG capsule Take 1 capsule by mouth at bedtime.  05/01/10  Yes Historical Provider, MD  pyridOXINE (VITAMIN B-6) 100 MG tablet Take 100 mg by mouth every morning.    Yes Historical Provider, MD  Selenium 100 MCG TABS Take 2 tablets by mouth every morning.    Yes Historical Provider, MD  sildenafil (VIAGRA) 100 MG tablet Take 100 mg by mouth as needed. For erectile dysfunction.   Yes Historical Provider, MD  terazosin (HYTRIN) 5 MG capsule Take 5 mg by mouth at bedtime.    Yes Historical Provider, MD  zinc gluconate 50 MG tablet Take 50 mg by mouth every morning.    Yes Historical Provider, MD  Multiple Vitamins-Minerals (OPTIVITE) TABS NO INSTRUCTIONS GIVEN    Historical Provider, MD  Omega-3 Fatty Acids (FISH OIL) 1000 MG CAPS Take 1 capsule by mouth 2 (two) times daily.     Historical  Provider, MD    Allergies:   Allergies  Allergen Reactions  . Penicillins Rash    Rash appeared on palms of hands and feet.    Social History:  reports that he quit smoking about 26 years ago. He has never used smokeless tobacco. He reports that he drinks about 3.5 ounces of alcohol per week. He reports that he does not use illicit drugs.  Family History: Family History  Problem Relation Age of Onset  . Cancer Father   . Diabetes Father     Physical Exam: Filed Vitals:   03/12/11 1051 03/12/11 1145  03/12/11 1200 03/12/11 1300  BP:  134/77 147/84   Pulse: 59 60 64   Temp:  97.5 F (36.4 C)    TempSrc:  Oral    Resp:  20 18   Height:    6\' 1"  (1.854 m)  Weight:    92.987 kg (205 lb)  SpO2:  94% 99%    General appearance: alert, cooperative and appears stated age Head: Normocephalic, without obvious abnormality, atraumatic Eyes: conjunctivae/corneas clear. PERRL, EOM's intact. Fundi benign. Neck: no adenopathy, no carotid bruit, no JVD, supple, symmetrical, trachea midline and thyroid not enlarged, symmetric, no tenderness/mass/nodules Resp: clear to auscultation bilaterally Cardio: regular rate and rhythm, S1, S2 normal, no murmur, click, rub or gallop GI: soft, non-tender; bowel sounds normal; no masses,  no organomegaly Extremities: extremities normal, atraumatic, no cyanosis or edema Pulses: 2+ and symmetric Neurologic: AAOx3. Power 4-5/5 right arm, otherwise normal. Left facial flattening which patient and wife say is not new, related to Ramsay Hunt syndrome. Reflexes less brisk right side. Babinski plantar going bilaterally.   Labs on Admission:   [facility] 03/12/11 0950 03/12/11 0840  NA 142 138  K 4.4 3.8  CL 103 102  CO2 -- 29  GLUCOSE 105* 107*  BUN 21 20  CREATININE 1.40* 1.13  CALCIUM -- 9.9  MG -- --  PHOS -- --    Basename 03/12/11 0840  AST 22  ALT 14  ALKPHOS 103  BILITOT 1.3*  PROT 6.9  ALBUMIN 3.8   No results found for this basename: LIPASE:2,AMYLASE:2 in the last 72 hours  Basename 03/12/11 0950 03/12/11 0840  WBC -- 5.8  NEUTROABS -- 3.0  HGB 13.6 13.8  HCT 40.0 40.0  MCV -- 97.8  PLT -- 233    Basename 03/12/11 0840  CKTOTAL --  CKMB --  CKMBINDEX --  TROPONINI <0.30   No results found for this basename: TSH,T4TOTAL,FREET3,T3FREE,THYROIDAB in the last 72 hours No results found for this basename: VITAMINB12:2,FOLATE:2,FERRITIN:2,TIBC:2,IRON:2,RETICCTPCT:2 in the last 72 hours  Radiological Exams on Admission: Ct Head Wo  Contrast  03/12/2011  *RADIOLOGY REPORT*  Clinical Data: Code stroke.  Right upper extremity weakness.  CT HEAD WITHOUT CONTRAST  Technique:  Contiguous axial images were obtained from the base of the skull through the vertex without contrast.  Comparison: None.  Findings: There is atrophy and chronic small vessel disease changes. No acute intracranial abnormality.  Specifically, no hemorrhage, hydrocephalus, mass lesion, acute infarction, or significant intracranial injury.  No acute calvarial abnormality.  Extensive disease within the left maxillary sinus.  Remainder of the paranasal sinuses are clear as are the mastoids.  IMPRESSION: No acute intracranial abnormality.  Atrophy, chronic microvascular disease.  Critical Value/emergent results were called by telephone at the time of interpretation on 03/12/2011  at 8:45 a.m.  to  Dr. Lynelle Doctor, who verbally acknowledged these results.  Original Report Authenticated By: Cyndie Chime,  M.D.   Mr Angiogram Head Wo Contrast  03/12/2011  *RADIOLOGY REPORT*  Clinical Data:  Acute stroke  MRI HEAD WITHOUT CONTRAST MRA HEAD WITHOUT CONTRAST  Technique:  Multiplanar, multiecho pulse sequences of the brain and surrounding structures were obtained without intravenous contrast. Angiographic images of the head were obtained using MRA technique without contrast.  Comparison:  CT 03/12/2011  MRI HEAD  Findings:  Small area of probable restricted diffusion in the left parietal subcortical white matter and cortex.  This is most likely an area of recent infarction.  Generalized atrophy.  Chronic ischemic changes in the white matter bilaterally.  Infarct in the left frontal cortex is small and appears chronic.  Brainstem is normal.  Small area of chronic infarction left cerebellum.  Negative for intracranial hemorrhage.  Negative for mass or edema. Negative for hydrocephalus.  Sinusitis with extensive mucosal thickening left maxillary sinus and mild mucosal thickening right  maxillary sinus.  Mastoid sinus effusions bilaterally.  IMPRESSION: Area of  restricted diffusion left parietal lobe compatible with a small area of acute infarction.  Atrophy and chronic ischemic changes in the white matter and left frontal cortex.  MRA HEAD  Findings: Both vertebral arteries are patent to the basilar. Basilar is widely patent.  PICA, superior cerebellar, and posterior cerebral arteries are widely patent without stenosis.  Internal carotid artery is patent bilaterally.  Anterior and middle cerebral arteries are patent bilaterally.  Negative for cerebral aneurysm.  IMPRESSION: No significant intracranial stenosis.  Original Report Authenticated By: Camelia Phenes, M.D.   Mr Brain Wo Contrast  03/12/2011  *RADIOLOGY REPORT*  Clinical Data:  Acute stroke  MRI HEAD WITHOUT CONTRAST MRA HEAD WITHOUT CONTRAST  Technique:  Multiplanar, multiecho pulse sequences of the brain and surrounding structures were obtained without intravenous contrast. Angiographic images of the head were obtained using MRA technique without contrast.  Comparison:  CT 03/12/2011  MRI HEAD  Findings:  Small area of probable restricted diffusion in the left parietal subcortical white matter and cortex.  This is most likely an area of recent infarction.  Generalized atrophy.  Chronic ischemic changes in the white matter bilaterally.  Infarct in the left frontal cortex is small and appears chronic.  Brainstem is normal.  Small area of chronic infarction left cerebellum.  Negative for intracranial hemorrhage.  Negative for mass or edema. Negative for hydrocephalus.  Sinusitis with extensive mucosal thickening left maxillary sinus and mild mucosal thickening right maxillary sinus.  Mastoid sinus effusions bilaterally.  IMPRESSION: Area of  restricted diffusion left parietal lobe compatible with a small area of acute infarction.  Atrophy and chronic ischemic changes in the white matter and left frontal cortex.  MRA HEAD  Findings:  Both vertebral arteries are patent to the basilar. Basilar is widely patent.  PICA, superior cerebellar, and posterior cerebral arteries are widely patent without stenosis.  Internal carotid artery is patent bilaterally.  Anterior and middle cerebral arteries are patent bilaterally.  Negative for cerebral aneurysm.  IMPRESSION: No significant intracranial stenosis.  Original Report Authenticated By: Camelia Phenes, M.D.    Assessment Acute nonhemorrhagic cerebrovascular infarct in otherwise healthy hypertensive male being worked up for chest pain outpatient. He seems to have retained most of his function.  Plan 1. Acute CVA- admit telemetry. Stroke protocol, including lipids panel/ 2 decho/carotid duplex/tele monitoring/lipids panel. Follow neurology recommendations. Will start plavix/crestor. Optimize BP control. 2. Malignant Htn- resume home meds. 3. AKI- seems new- rehydrate and see. Will not do extensive work up  at this point unless worsening. 4. DVT/GI prophylaxis. Patient's condition closely guarded.  Erika Slaby 03/12/2011, 2:23 PM

## 2011-03-12 NOTE — ED Notes (Signed)
Pt is now waiting for admission and internal medicine consult

## 2011-03-12 NOTE — ED Notes (Addendum)
Pt transported to CT and back with RN, code stroke was called.

## 2011-03-12 NOTE — ED Notes (Signed)
Report given to [facility] 

## 2011-03-12 NOTE — ED Provider Notes (Addendum)
History     CSN: [id number]  Arrival date & time 03/12/11  0802   First MD Initiated Contact with Patient 03/12/11 [phone]      Chief Complaint  Patient presents with  . Extremity Weakness    (Consider location/radiation/quality/duration/timing/severity/associated sxs/prior treatment) HPI Patient presents to the emergency room with complaints of extremity weakness. The patient states he got up this morning and took a shower. He recalls feeling fine initially as he started the shower he got out of the shower however he noticed he was having trouble lifting his right arm to dry off. Patient states that his right arm has continued to feel weak but he has also had some numbness on the right side. He denies any trouble with his speech. He did feel his leg might have felt a little bit weak as well earlier but that is mostly improved. 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 He has not noticed any trouble's balance. He's not had a headache. There's been no injuries. Past Medical History  Diagnosis Date  . Ramsay Hunt auricular syndrome   . HTN (hypertension)   . Osteoarthritis     Past Surgical History  Procedure Date  . Hernia repair   . Vasectomy   . Prostate biopsies     per Vonita Moss  . Cataract extraction     SE eye    Family History  Problem Relation Age of Onset  . Cancer Father   . Diabetes Father     History  Substance Use Topics  . Smoking status: Former Smoker    Quit date: 06/22/1984  . Smokeless tobacco: Never Used  . Alcohol Use: 3.5 oz/week    7 drink(s) per week     1 glass of wine a day      Review of Systems  All other systems reviewed and are negative.    Allergies  Penicillins  Home Medications   Current Outpatient Rx  Name Route Sig Dispense Refill  . ASPIRIN 81 MG PO TBEC Oral Take 81 mg by mouth daily.      Marland Kitchen VITAMIN D-3 PO Oral Take 5,000 Units by mouth daily.      Marland Kitchen DILTIAZEM HCL ER  COATED BEADS 120 MG PO CP24 Oral Take 120 mg by mouth daily.     Marland Kitchen GABAPENTIN 600 MG PO TABS       . HYDROCHLOROTHIAZIDE 25 MG PO TABS Oral Take 25 mg by mouth every morning.     . OPTIVITE PO TABS  NO INSTRUCTIONS GIVEN     . FISH OIL 1000 MG PO CAPS Oral Take 1 capsule by mouth 2 (two) times daily.     Marland Kitchen OMEPRAZOLE 20 MG PO CPDR Oral Take 1 capsule by mouth daily.     Marland Kitchen VITAMIN B-6 100 MG PO TABS Oral Take 100 mg by mouth daily.      . SELENIUM 100 MCG PO TABS Oral Take 2 tablets by mouth daily.     Marland Kitchen SILDENAFIL CITRATE 100 MG PO TABS Oral Take 100 mg by mouth as needed.     . TERAZOSIN HCL 5 MG PO CAPS Oral Take 5 mg by mouth at bedtime.     Marland Kitchen ZINC GLUCONATE 50 MG PO TABS Oral Take 50 mg by mouth daily.        BP 141/80  Pulse 71  Temp(Src) 97.5 F (36.4 C) (Oral)  Resp 18  SpO2 98%  Physical  Exam  Nursing note and vitals reviewed. Constitutional: He appears well-developed and well-nourished. No distress.  HENT:  Head: Normocephalic and atraumatic.  Right Ear: External ear normal.  Left Ear: External ear normal.  Eyes: Conjunctivae are normal. Right eye exhibits no discharge. Left eye exhibits no discharge. No scleral icterus.  Neck: Neck supple. No tracheal deviation present.  Cardiovascular: Normal rate, regular rhythm and intact distal pulses.   Pulmonary/Chest: Effort normal and breath sounds normal. No stridor. No respiratory distress. He has no wheezes. He has no rales.  Abdominal: Soft. Bowel sounds are normal. He exhibits no distension. There is no tenderness. There is no rebound and no guarding.  Musculoskeletal: He exhibits no edema and no tenderness.  Neurological: He is alert. He has normal strength. He is not disoriented. A cranial nerve deficit (Left-sided facial palsy, extraocular movements are intact, no visual field cuts) is present. No sensory deficit. He exhibits normal muscle tone. He displays no seizure activity. Coordination normal.       Father 5 strength  in his left arm right and left leg, normal sensation, weakness and inability to hold her right arm up against gravity  Skin: Skin is warm and dry. No rash noted.  Psychiatric: He has a normal mood and affect.    ED Course  Procedures (including critical care time)  Date: 03/12/2011  Rate: 58  Rhythm: normal sinus rhythm  QRS Axis: normal  Intervals: normal  ST/T Wave abnormalities: normal  Conduction Disutrbances:none  Narrative Interpretation:   Old EKG Reviewed: unchanged   Labs Reviewed  CBC - Abnormal; Notable for the following:    RBC 4.09 (*)    All other components within normal limits  DIFFERENTIAL - Abnormal; Notable for the following:    Monocytes Relative 13 (*)    All other components within normal limits  COMPREHENSIVE METABOLIC PANEL - Abnormal; Notable for the following:    Glucose, Bld 107 (*)    Total Bilirubin 1.3 (*)    GFR calc non Af Amer 60 (*)    GFR calc Af Amer 70 (*)    All other components within normal limits  GLUCOSE, CAPILLARY - Abnormal; Notable for the following:    Glucose-Capillary 103 (*)    All other components within normal limits  POCT I-STAT, CHEM 8 - Abnormal; Notable for the following:    Creatinine, Ser 1.40 (*)    Glucose, Bld 105 (*)    All other components within normal limits  PROTIME-INR  APTT  TROPONIN I  I-STAT, CHEM 8  POCT CBG MONITORING   Ct Head Wo Contrast  03/12/2011  *RADIOLOGY REPORT*  Clinical Data: Code stroke.  Right upper extremity weakness.  CT HEAD WITHOUT CONTRAST  Technique:  Contiguous axial images were obtained from the base of the skull through the vertex without contrast.  Comparison: None.  Findings: There is atrophy and chronic small vessel disease changes. No acute intracranial abnormality.  Specifically, no hemorrhage, hydrocephalus, mass lesion, acute infarction, or significant intracranial injury.  No acute calvarial abnormality.  Extensive disease within the left maxillary sinus.  Remainder of  the paranasal sinuses are clear as are the mastoids.  IMPRESSION: No acute intracranial abnormality.  Atrophy, chronic microvascular disease.  Critical Value/emergent results were called by telephone at the time of interpretation on 03/12/2011  at 8:45 a.m.  to  Dr. Lynelle Doctor, who verbally acknowledged these results.  Original Report Authenticated By: Cyndie Chime, M.D.     Dx: Acute Stroke  MDM  8:33 AM patient has acute onset of neurologic symptoms that started within the last hour. A code stroke has been called. Patient has been having some improvement however it is not completely resolved.  9:31 AM Dr. Thad Ranger has seen the patient. The patient is getting better although the symptoms are not completely resolved. She does not feel it is a TPA candidate based on the fact the symptoms are rather mild at this point and continued to improve. I will consult the medical service her to get him admitted to the hospital for further treatment and evaluation. She does recommend that he get admitted to [facility]  [place]   CRITICAL CARE Performed by: Celene Kras   Total critical care time:30  Critical care time was exclusive of separately billable procedures and treating other patients.  Critical care was necessary to treat or prevent imminent or life-threatening deterioration.  Critical care was time spent personally by me on the following activities: development of treatment plan with patient and/or surrogate as well as nursing, discussions with consultants, evaluation of patient's response to treatment, examination of patient, obtaining history from patient or surrogate, ordering and performing treatments and interventions, ordering and review of laboratory studies, ordering and review of radiographic studies, pulse oximetry and re-evaluation of patient's condition.     Celene Kras, MD 03/12/11 [id number]  Celene Kras, MD 03/12/11 [phone]

## 2011-03-12 NOTE — ED Notes (Signed)
Nursing round done. Swallow screen done, pt had passed. Pt informed he will be going to a neuro floor at [facility].

## 2011-03-12 NOTE — ED Notes (Addendum)
At 0730 after shower, pt has numbness and right side, felt right side of body was weaker. Denied hx of stroke. Clear speech, able to ambulate. Pt is alert, oriented, coherent. Pt still c/o right side weakness but the numbness subsided. Please see NIH stroke scale above.

## 2011-03-12 NOTE — ED Notes (Signed)
Stroke specialist MD in room

## 2011-03-12 NOTE — Consult Note (Signed)
Referring Physician: Lynelle Doctor    Chief Complaint: Right sided weakness  HPI: [name] [name] is an 77 y.o. male who reports awakening normal today.  After his shower was noted to not be able to move his right arm well.  Had some numbness as well.  Presented to the ED and on initial evaluation was felt to have some minor right leg weakness as well.  Code stroke was called and patient went for CT of the head.  After head CT was felt to have improvement in his right sided symptoms although they had not completely resolved.  Initial NIHSS of 5 with much of the defiits secondary to prior abnormalities.  LSN: [id number]  tPA Given: No: Improvement in symptoms MRankin: 0  Past Medical History  Diagnosis Date  . Ramsay Hunt auricular syndrome   . HTN (hypertension)   . Osteoarthritis     Past Surgical History  Procedure Date  . Hernia repair   . Vasectomy   . Prostate biopsies     per Vonita Moss  . Cataract extraction     SE eye    Family History  Problem Relation Age of Onset  . Cancer Father   . Diabetes Father    Social History:  reports that he quit smoking about 26 years ago. He has never used smokeless tobacco. He reports that he drinks about 3.5 ounces of alcohol per week. He reports that he does not use illicit drugs.  Allergies:  Allergies  Allergen Reactions  . Penicillins     Medications: I have reviewed the patient's current medications. Prior to Admission: ASA, Vit D, Cardizem, Neurontin, HCTZ, MVI, Omega 3, Prilosec, Vit B6, Selenium, Viagra, Hytrin, Zinc gluconate   ROS: History obtained from the patient  General ROS: negative for - chills, fatigue, fever, night sweats, weight gain or weight loss Psychological ROS: negative for - behavioral disorder, hallucinations, memory difficulties, mood swings or suicidal ideation Ophthalmic ROS: negative for - blurry vision, double vision, eye pain or loss of vision ENT ROS: negative for - epistaxis, nasal discharge, oral  lesions, sore throat, tinnitus or vertigo Allergy and Immunology ROS: negative for - hives or itchy/watery eyes Hematological and Lymphatic ROS: negative for - bleeding problems, bruising or swollen lymph nodes Endocrine ROS: negative for - galactorrhea, hair pattern changes, polydipsia/polyuria or temperature intolerance Respiratory ROS: negative for - cough, hemoptysis, shortness of breath or wheezing Cardiovascular ZOX:WRUEAV evaluation for chest pain Gastrointestinal ROS: negative for - abdominal pain, diarrhea, hematemesis, nausea/vomiting or stool incontinence Genito-Urinary ROS: negative for - dysuria, hematuria, incontinence or urinary frequency/urgency Musculoskeletal ROS: 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 rate 18, SpO2 98.00%.  Neurologic Examination: Mental Status: Alert, oriented, thought content appropriate.  Speech fluent without evidence of aphasia.  Able to follow 3 step commands without difficulty. Cranial Nerves: II: visual fields grossly normal, pupils equal, round, reactive to light and accommodation III,IV, VI: ptosis not present, extra-ocular motions intact bilaterally V,VII: left facial droop, decreased pinprick and light touch on the right side of the face VIII: hearing normal bilaterally IX,X: gag reflex present XI: trapezius strength/neck flexion strength normal bilaterally XII: tongue strength normal  Motor: Right : Upper extremity   5-/5    Left:     Upper extremity   5/5  Lower extremity  5/5     Lower extremity   5/5 Tone and bulk:normal tone throughout; no atrophy noted.  Sensory: Pinprick and light touch mildly decreased on the right upper extremity. Deep Tendon Reflexes: 2+ and symmetric throughout Plantars: Right:  downgoing   Left: downgoing Cerebellar: Finger-to-nose with dysmetria using the RUE, otherwise normal finger-to-nose and normal heel-to-shin test  Results for orders placed during the hospital encounter of 03/12/11 (from the past 48 hour(s))  PROTIME-INR     Status: Normal   Collection Time   03/12/11  8:40 AM      Component Value Range Comment   Prothrombin Time 12.9  11.6 - 15.2 (seconds)    INR 0.95  0.00 - 1.49    APTT     Status: Normal   Collection Time   03/12/11  8:40 AM      Component Value Range Comment   aPTT 28  24 - 37 (seconds)   CBC     Status: Abnormal   Collection Time   03/12/11  8:40 AM      Component Value Range Comment   WBC 5.8  4.0 - 10.5 (K/uL)    RBC 4.09 (*) 4.22 - 5.81 (MIL/uL)    Hemoglobin 13.8  13.0 - 17.0 (g/dL)    HCT 40.9  81.1 - 91.4 (%)    MCV 97.8  78.0 - 100.0 (fL)    MCH 33.7  26.0 - 34.0 (pg)    MCHC 34.5  30.0 - 36.0 (g/dL)    RDW 78.2  95.6 - 21.3 (%)    Platelets 233  150 - 400 (K/uL)   DIFFERENTIAL     Status: Abnormal   Collection Time   03/12/11  8:40 AM      Component Value Range Comment   Neutrophils Relative 52  43 - 77 (%)    Neutro Abs 3.0  1.7 - 7.7 (K/uL)    Lymphocytes Relative 30  12 - 46 (%)    Lymphs Abs 1.8  0.7 - 4.0 (K/uL)    Monocytes Relative 13 (*) 3 - 12 (%)    Monocytes Absolute 0.7  0.1 - 1.0 (K/uL)    Eosinophils Relative 5  0 - 5 (%)    Eosinophils Absolute 0.3  0.0 - 0.7 (K/uL)    Basophils Relative 0  0 - 1 (%)    Basophils Absolute 0.0  0.0 - 0.1 (K/uL)   COMPREHENSIVE METABOLIC PANEL     Status: Abnormal   Collection Time   03/12/11  8:40 AM      Component Value Range Comment   Sodium 138  135 - 145 (mEq/L)    Potassium 3.8  3.5 - 5.1 (mEq/L)    Chloride 102  96 - 112 (mEq/L)    CO2 29  19 - 32 (mEq/L)    Glucose, Bld 107 (*) 70 - 99 (mg/dL)    BUN 20  6 - 23 (mg/dL)    Creatinine, Ser 0.86  0.50 - 1.35 (mg/dL)    Calcium 9.9  8.4 - 10.5 (mg/dL)    Total Protein 6.9  6.0 - 8.3 (g/dL)     Albumin 3.8  3.5 - 5.2 (g/dL)    AST 22  0 - 37 (U/L)    ALT 14  0 - 53 (U/L)    Alkaline Phosphatase 103  39 - 117 (U/L)    Total Bilirubin 1.3 (*) 0.3 - 1.2 (mg/dL)    GFR calc non Af Amer 60 (*) >90 (  mL/min)    GFR calc Af Amer 70 (*) >90 (mL/min)   TROPONIN I     Status: Normal   Collection Time   03/12/11  8:40 AM      Component Value Range Comment   Troponin I <0.30  <0.30 (ng/mL)   GLUCOSE, CAPILLARY     Status: Abnormal   Collection Time   03/12/11  8:47 AM      Component Value Range Comment   Glucose-Capillary 103 (*) 70 - 99 (mg/dL)    Ct Head Wo Contrast  03/12/2011  *RADIOLOGY REPORT*  Clinical Data: Code stroke.  Right upper extremity weakness.  CT HEAD WITHOUT CONTRAST  Technique:  Contiguous axial images were obtained from the base of the skull through the vertex without contrast.  Comparison: None.  Findings: There is atrophy and chronic small vessel disease changes. No acute intracranial abnormality.  Specifically, no hemorrhage, hydrocephalus, mass lesion, acute infarction, or significant intracranial injury.  No acute calvarial abnormality.  Extensive disease within the left maxillary sinus.  Remainder of the paranasal sinuses are clear as are the mastoids.  IMPRESSION: No acute intracranial abnormality.  Atrophy, chronic microvascular disease.  Critical Value/emergent results were called by telephone at the time of interpretation on 03/12/2011  at 8:45 a.m.  to  Dr. Lynelle Doctor, who verbally acknowledged these results.  Original Report Authenticated By: Cyndie Chime, M.D.    Assessment: 77 y.o. male who presents with right sided weakness/numbness and dysmetria.  Symptoms resolving.  Current deficits minimal.  Suspect posterior circulation ischemic event.  CT unremarkable.  Full work up recommended.  Stroke Risk Factors - hypertension  Plan: 1. HgbA1c, fasting lipid panel 2. MRI, MRA  of the brain without contrast 3. PT consult, OT consult 4. Echocardiogram 5. Carotid  dopplers 6. Prophylactic therapy-Antiplatelet med: Plavix - dose 75mg  daily 7. Risk factor modification   Thana Farr, MD [facility] [phone] 03/12/2011, 9:37 AM

## 2011-03-12 NOTE — ED Notes (Signed)
Pt transported to MRI 

## 2011-03-12 NOTE — ED Notes (Addendum)
Attempted to call report to [facility] 3000, RN not able to take report at this moment. Pt remained at MRI

## 2011-03-12 NOTE — ED Notes (Signed)
carelink at bedside 

## 2011-03-13 DIAGNOSIS — I369 Nonrheumatic tricuspid valve disorder, unspecified: Secondary | ICD-10-CM

## 2011-03-13 DIAGNOSIS — I6350 Cerebral infarction due to unspecified occlusion or stenosis of unspecified cerebral artery: Secondary | ICD-10-CM

## 2011-03-13 LAB — CBC
MCH: 34.3 pg — ABNORMAL HIGH (ref 26.0–34.0)
MCHC: 34.5 g/dL (ref 30.0–36.0)
Platelets: 214 10*3/uL (ref 150–400)
RBC: 3.82 MIL/uL — ABNORMAL LOW (ref 4.22–5.81)
RDW: 13.1 % (ref 11.5–15.5)

## 2011-03-13 LAB — COMPREHENSIVE METABOLIC PANEL
ALT: 11 U/L (ref 0–53)
AST: 17 U/L (ref 0–37)
Albumin: 3.2 g/dL — ABNORMAL LOW (ref 3.5–5.2)
CO2: 30 mEq/L (ref 19–32)
Calcium: 9.3 mg/dL (ref 8.4–10.5)
Sodium: 141 mEq/L (ref 135–145)
Total Protein: 6.2 g/dL (ref 6.0–8.3)

## 2011-03-13 LAB — LIPID PANEL
Cholesterol: 154 mg/dL (ref 0–200)
HDL: 65 mg/dL (ref >39)
Total CHOL/HDL Ratio: 2.4 RATIO
Triglycerides: 57 mg/dL (ref <150)

## 2011-03-13 LAB — APTT: aPTT: 30 seconds (ref 24–37)

## 2011-03-13 LAB — PROTIME-INR: INR: 1.03 (ref 0.00–1.49)

## 2011-03-13 MED ORDER — ROSUVASTATIN CALCIUM 10 MG PO TABS: 10.0000 mg | ORAL_TABLET | Freq: Every day | ORAL | Status: DC

## 2011-03-13 MED ORDER — CLOPIDOGREL BISULFATE 75 MG PO TABS: 75.0000 mg | ORAL_TABLET | Freq: Every day | ORAL | Status: AC

## 2011-03-13 MED ORDER — HYDROCHLOROTHIAZIDE 12.5 MG PO CAPS
12.5000 mg | ORAL_CAPSULE | Freq: Every day | ORAL | Status: DC
  Filled 2011-03-13: qty 1

## 2011-03-13 MED ORDER — LISINOPRIL 5 MG PO TABS: 5.0000 mg | ORAL_TABLET | Freq: Every day | ORAL | Status: DC

## 2011-03-13 MED ORDER — TAMSULOSIN HCL 0.4 MG PO CAPS: 0.4000 mg | ORAL_CAPSULE | Freq: Every day | ORAL | Status: DC

## 2011-03-13 NOTE — Progress Notes (Signed)
*  PRELIMINARY RESULTS* Carotid dopplers performed. Preliminary findings showed no ICA stenosisl. Antegrade vertebral artery flow bilateral. Vinetta Bergamo 03/13/2011, 11:00 AM

## 2011-03-13 NOTE — Progress Notes (Signed)
Subjective: Patient is alert and cooperative, no complaints of headache or vision changes. The patient reports some clumsiness involving the right arm.  Objective: Vital signs in last 24 hours: Temp:  [97.3 F (36.3 C)-97.8 F (36.6 C)] 97.3 F (36.3 C) (12/24 0910) Pulse Rate:  [57-65] 60  (12/24 0910) Resp:  [18] 18  (12/24 0910) BP: (122-143)/(76-85) 122/76 mmHg (12/24 0910) SpO2:  [90 %-95 %] 94 % (12/24 0910) Weight change:  Last BM Date: 03/12/11  Intake/Output from previous day: 12/23 0701 - 12/24 0700 In: -  Out: 425 [Urine:425] Intake/Output this shift:    @ROS @  The patient reports no headache, vision changes. The patient denies any significant numbness of the hands or arms or legs.  The patient denies chest pain, shortness of breath, abdominal pain.  Patient reports some clumsiness involving the right hand, but indicates 85% improvement.   Physical Examination:  Mental Status Exam: The patient is alert and cooperative at the time of the examination. The patient is oriented x3.  Motor Exam: The patient moves all 4 extremities, and has good strength bilaterally.  Cerebellar Exam: The patient has good finger-nose-finger and heel-to-shin bilaterally, with the exception of a mild dysmetria involving the right hand with finger-nose-finger. Gait was not tested.  Deep tendon reflexes: Deep tendon reflexes are symmetric throughout. Toes are downgoing bilaterally.  Cranial Nerve Exam: Facial symmetry is not present. Smile is asymmetric, decreased on the left . Extraocular movements are full. Visual fields are full. Speech is well enunciated, no aphasia or dysarthria is noted.    Lab Results:  Basename 03/13/11 0500 03/12/11 1621  WBC 7.9 7.2  HGB 13.1 13.4  HCT 38.0* 38.3*  PLT 214 220   BMET  Basename 03/13/11 0500 03/12/11 1621 03/12/11 0950 03/12/11 0840  NA 141 -- 142 --  K 4.1 -- 4.4 --  CL 105 -- 103 --  CO2 30 -- -- 29  GLUCOSE 105* -- 105* --    BUN 20 -- 21 --  CREATININE 1.17 1.09 -- --  CALCIUM 9.3 -- -- 9.9    Studies/Results: X-ray Chest Pa And Lateral   03/12/2011  *RADIOLOGY REPORT*  Clinical Data: Chest pain.  CHEST - 2 VIEW  Comparison: Chest 12/15/2010.  Findings: Lungs are clear.  Heart size is normal.  No pneumothorax or effusion.  IMPRESSION: No acute disease.  Original Report Authenticated By: Bernadene Bell. Maricela Curet, M.D.   Ct Head Wo Contrast  03/12/2011  *RADIOLOGY REPORT*  Clinical Data: Code stroke.  Right upper extremity weakness.  CT HEAD WITHOUT CONTRAST  Technique:  Contiguous axial images were obtained from the base of the skull through the vertex without contrast.  Comparison: None.  Findings: There is atrophy and chronic small vessel disease changes. No acute intracranial abnormality.  Specifically, no hemorrhage, hydrocephalus, mass lesion, acute infarction, or significant intracranial injury.  No acute calvarial abnormality.  Extensive disease within the left maxillary sinus.  Remainder of the paranasal sinuses are clear as are the mastoids.  IMPRESSION: No acute intracranial abnormality.  Atrophy, chronic microvascular disease.  Critical Value/emergent results were called by telephone at the time of interpretation on 03/12/2011  at 8:45 a.m.  to  Dr. Lynelle Doctor, who verbally acknowledged these results.  Original Report Authenticated By: Cyndie Chime, M.D.   Mr Angiogram Head Wo Contrast  03/12/2011  *RADIOLOGY REPORT*  Clinical Data:  Acute stroke  MRI HEAD WITHOUT CONTRAST MRA HEAD WITHOUT CONTRAST  Technique:  Multiplanar, multiecho pulse sequences of the  brain and surrounding structures were obtained without intravenous contrast. Angiographic images of the head were obtained using MRA technique without contrast.  Comparison:  CT 03/12/2011  MRI HEAD  Findings:  Small area of probable restricted diffusion in the left parietal subcortical white matter and cortex.  This is most likely an area of recent infarction.   Generalized atrophy.  Chronic ischemic changes in the white matter bilaterally.  Infarct in the left frontal cortex is small and appears chronic.  Brainstem is normal.  Small area of chronic infarction left cerebellum.  Negative for intracranial hemorrhage.  Negative for mass or edema. Negative for hydrocephalus.  Sinusitis with extensive mucosal thickening left maxillary sinus and mild mucosal thickening right maxillary sinus.  Mastoid sinus effusions bilaterally.  IMPRESSION: Area of  restricted diffusion left parietal lobe compatible with a small area of acute infarction.  Atrophy and chronic ischemic changes in the white matter and left frontal cortex.  MRA HEAD  Findings: Both vertebral arteries are patent to the basilar. Basilar is widely patent.  PICA, superior cerebellar, and posterior cerebral arteries are widely patent without stenosis.  Internal carotid artery is patent bilaterally.  Anterior and middle cerebral arteries are patent bilaterally.  Negative for cerebral aneurysm.  IMPRESSION: No significant intracranial stenosis.  Original Report Authenticated By: Camelia Phenes, M.D.   Mr Brain Wo Contrast  03/12/2011  *RADIOLOGY REPORT*  Clinical Data:  Acute stroke  MRI HEAD WITHOUT CONTRAST MRA HEAD WITHOUT CONTRAST  Technique:  Multiplanar, multiecho pulse sequences of the brain and surrounding structures were obtained without intravenous contrast. Angiographic images of the head were obtained using MRA technique without contrast.  Comparison:  CT 03/12/2011  MRI HEAD  Findings:  Small area of probable restricted diffusion in the left parietal subcortical white matter and cortex.  This is most likely an area of recent infarction.  Generalized atrophy.  Chronic ischemic changes in the white matter bilaterally.  Infarct in the left frontal cortex is small and appears chronic.  Brainstem is normal.  Small area of chronic infarction left cerebellum.  Negative for intracranial hemorrhage.  Negative for  mass or edema. Negative for hydrocephalus.  Sinusitis with extensive mucosal thickening left maxillary sinus and mild mucosal thickening right maxillary sinus.  Mastoid sinus effusions bilaterally.  IMPRESSION: Area of  restricted diffusion left parietal lobe compatible with a small area of acute infarction.  Atrophy and chronic ischemic changes in the white matter and left frontal cortex.  MRA HEAD  Findings: Both vertebral arteries are patent to the basilar. Basilar is widely patent.  PICA, superior cerebellar, and posterior cerebral arteries are widely patent without stenosis.  Internal carotid artery is patent bilaterally.  Anterior and middle cerebral arteries are patent bilaterally.  Negative for cerebral aneurysm.  IMPRESSION: No significant intracranial stenosis.  Original Report Authenticated By: Camelia Phenes, M.D.    Medications:  Scheduled:   . clopidogrel  75 mg Oral Q breakfast  . diltiazem  120 mg Oral Q0600  . enoxaparin  30 mg Subcutaneous Q24H  . gabapentin  600 mg Oral BID  . hydrochlorothiazide  12.5 mg Oral Daily  . omega-3 acid ethyl esters  1 g Oral Daily  . pantoprazole  40 mg Oral Q1200  . rosuvastatin  10 mg Oral q1800  . terazosin  5 mg Oral QHS  . DISCONTD: hydrochlorothiazide  25 mg Oral Q0600  . DISCONTD: omega-3 acid ethyl esters  1 g Oral Daily   Continuous:   . sodium  chloride 50 mL/hr at 03/13/11 1136   WNU:[id number], acetaminophen, alum & mag hydroxide-simeth, HYDROcodone-acetaminophen, ondansetron (ZOFRAN) IV, ondansetron  Assessment/Plan:  1. Left parietal stroke event  2. Hypertension  The patient is doing quite well this time. The patient was on aspirin prior to coming in. The patient will be discharged on Plavix. The 2-D echocardiogram results are pending. Carotid Doppler study is unremarkable. The patient may need some outpatient occupational therapy.     LOS: 1 day   , KEITH 03/13/2011, 1:07 PM

## 2011-03-13 NOTE — Progress Notes (Signed)
  Echocardiogram 2D Echocardiogram has been performed.  Cathie Beams Deneen 03/13/2011, 11:07 AM

## 2011-03-13 NOTE — Progress Notes (Signed)
Physical Therapy Evaluation Patient Details Name: [name] [name] MRN: [id number] DOB: 1930-07-18 Today's Date: 03/13/2011  Problem List:  Patient Active Problem List  Diagnoses  . ERECTILE DYSFUNCTION  . TOBACCO DEPENDENCE  . NEUROPATHY, IDIOPATHIC PERIPHERAL  . HYPERTENSION, BENIGN SYSTEMIC  . DIVERTICULOSIS OF COLON  . BPH  . BACK PAIN, LUMBAR  . SCIATICA, RIGHT  . METATARSALGIA  . NUMBNESS  . Left knee pain  . Knee effusion, left  . Chest pain  . CVA (cerebral infarction)  . HTN (hypertension), malignant  . Acute kidney injury    Past Medical History:  Past Medical History  Diagnosis Date  . Ramsay Hunt auricular syndrome   . HTN (hypertension)   . Osteoarthritis    Past Surgical History:  Past Surgical History  Procedure Date  . Hernia repair   . Vasectomy   . Prostate biopsies     per Vonita Moss  . Cataract extraction     SE eye    PT Assessment/Plan/Recommendation PT Assessment Clinical Impression Statement: Patient presented to hospital with stroke symptoms and initial NIHSS of 5 MRI showed area of restricted diffusion left parietal lobe compatible with a small area of acute infarction. Patient states that symptoms all resolved except right arm which feels approx 75%. His PT evaluation reveals no skilled needs. He was educated in stroke risk factors and warning signs. PT Recommendation/Assessment: Patent does not need any further PT services No Skilled PT: All education completed;Patient is independent with all acitivity/mobility PT Recommendation Follow Up Recommendations: None Equipment Recommended: None recommended by PT  PT Evaluation Prior Functioning  Home Living Lives With: Spouse Receives Help From: Family Type of Home: House Home Layout: Two level;Able to live on main level with bedroom/bathroom (office upstairs) Alternate Level Stairs-Rails: Left Alternate Level Stairs-Number of Steps: 18 Home Access: Stairs to enter Entrance  Stairs-Rails: None Entrance Stairs-Number of Steps: 2 - 8 or 9 also down tot he yard to [employer] or dogs Bathroom Shower/Tub: Health visitor: Standard Home Adaptive Equipment: Straight cane Prior Function Level of Independence: Independent with basic ADLs;Independent with homemaking with ambulation Driving: Yes Vocation: Retired Comments: Working out at [employer]. 3 x a week Cognition Cognition Arousal/Alertness: Awake/alert Overall Cognitive Status: Appears within functional limits for tasks assessed Orientation Level: Oriented X4 Sensation/Coordination Sensation Light Touch: Appears Intact Proprioception: Appears Intact Coordination Gross Motor Movements are Fluid and Coordinated: Yes Extremity Assessment RLE Assessment RLE Assessment: Within Functional Limits LLE Assessment LLE Assessment: Within Functional Limits Mobility (including Balance) Bed Mobility Rolling Right: 7: Independent Rolling Left: 7: Independent Supine to Sit: 7: Independent Sitting - Scoot to Edge of Bed: 7: Independent Sit to Supine - Right: 7: Independent Scooting to HOB: 7: Independent Transfers Transfers: Yes Sit to Stand: 7: Independent Stand to Sit: 7: Independent Ambulation/Gait Ambulation/Gait: Yes Ambulation/Gait Assistance: 7: Independent Ambulation Distance (Feet): 300 Feet Assistive device: None Gait Pattern: Within Functional Limits Stairs: Yes Stairs Assistance: 7: Independent Stair Management Technique: No rails;Alternating pattern Number of Stairs: 4   Posture/Postural Control Posture/Postural Control: No significant limitations Dynamic Gait Index Level Surface: Normal Change in Gait Speed: Normal Gait with Horizontal Head Turns: Normal Gait with Vertical Head Turns: Normal Gait and Pivot Turn: Normal Step Over Obstacle: Normal Step Around Obstacles: Normal Steps: Normal Total Score: 24  End of Session PT - End of Session Equipment Utilized During  Treatment: Gait belt Activity Tolerance: Patient tolerated treatment well Patient left: in bed;with call bell in reach General Behavior During Session:  WFL for tasks performed Cognition: [facility] for tasks performed  Edwyna Perfect, PT  Pager [phone]  03/13/2011, 8:04 AM

## 2011-03-13 NOTE — Discharge Summary (Addendum)
Admit date: 03/12/2011 Discharge date: 03/13/2011  Primary Care Physician:  Miguel Aschoff, MD   Discharge Diagnoses:   Active Hospital Problems  Diagnoses Date Noted   . CVA (cerebral infarction) 03/12/2011   . HTN (hypertension), malignant 03/12/2011     Resolved Hospital Problems  Diagnoses Date Noted Date Resolved  . Acute kidney injury 03/12/2011 03/13/2011     DISCHARGE MEDICATION: Current Discharge Medication List    START taking these medications   Details  clopidogrel (PLAVIX) 75 MG tablet Take 1 tablet (75 mg total) by mouth daily with breakfast. Qty: 30 tablet, Refills: 0    lisinopril (PRINIVIL,ZESTRIL) 5 MG tablet Take 1 tablet (5 mg total) by mouth daily. Qty: 30 tablet, Refills: 0    rosuvastatin (CRESTOR) 10 MG tablet Take 1 tablet (10 mg total) by mouth daily at 6 PM. Qty: 30 tablet, Refills: 0    Tamsulosin HCl (FLOMAX) 0.4 MG CAPS Take 1 capsule (0.4 mg total) by mouth daily after supper. Qty: 30 capsule, Refills: 0      CONTINUE these medications which have NOT CHANGED   Details  Cholecalciferol (VITAMIN D-3 PO) Take 5,000 Units by mouth daily.      diltiazem (CARDIZEM CD) 120 MG 24 hr capsule Take 120 mg by mouth every morning.     gabapentin (NEURONTIN) 600 MG tablet 600 mg 2 (two) times daily.     hydrochlorothiazide 25 MG tablet Take 25 mg by mouth every morning.     omeprazole (PRILOSEC) 20 MG capsule Take 1 capsule by mouth at bedtime.     pyridOXINE (VITAMIN B-6) 100 MG tablet Take 100 mg by mouth every morning.     Selenium 100 MCG TABS Take 2 tablets by mouth every morning.     sildenafil (VIAGRA) 100 MG tablet Take 100 mg by mouth as needed. For erectile dysfunction.    zinc gluconate 50 MG tablet Take 50 mg by mouth every morning.     Multiple Vitamins-Minerals (OPTIVITE) TABS NO INSTRUCTIONS GIVEN    Omega-3 Fatty Acids (FISH OIL) 1000 MG CAPS Take 1 capsule by mouth 2 (two) times daily.       STOP taking these medications     aspirin ([facility] ASPIRIN EC) 81 MG EC tablet      terazosin (HYTRIN) 5 MG capsule            Consults: Treatment Team:  Md Stroke   SIGNIFICANT DIAGNOSTIC STUDIES:  X-ray Chest Pa And Lateral   03/12/2011  *RADIOLOGY REPORT*  Clinical Data: Chest pain.  CHEST - 2 VIEW  Comparison: Chest 12/15/2010.  Findings: Lungs are clear.  Heart size is normal.  No pneumothorax or effusion.  IMPRESSION: No acute disease.  Original Report Authenticated By: Bernadene Bell. Maricela Curet, M.D.   Ct Head Wo Contrast  03/12/2011  *RADIOLOGY REPORT*  Clinical Data: Code stroke.  Right upper extremity weakness.  CT HEAD WITHOUT CONTRAST  Technique:  Contiguous axial images were obtained from the base of the skull through the vertex without contrast.  Comparison: None.  Findings: There is atrophy and chronic small vessel disease changes. No acute intracranial abnormality.  Specifically, no hemorrhage, hydrocephalus, mass lesion, acute infarction, or significant intracranial injury.  No acute calvarial abnormality.  Extensive disease within the left maxillary sinus.  Remainder of the paranasal sinuses are clear as are the mastoids.  IMPRESSION: No acute intracranial abnormality.  Atrophy, chronic microvascular disease.  Critical Value/emergent results were called by telephone at the time of interpretation on 03/12/2011  at 8:45 a.m.  to  Dr. Lynelle Doctor, who verbally acknowledged these results.  Original Report Authenticated By: Cyndie Chime, M.D.   Mr Angiogram Head Wo Contrast  03/12/2011  *RADIOLOGY REPORT*  Clinical Data:  Acute stroke  MRI HEAD WITHOUT CONTRAST MRA HEAD WITHOUT CONTRAST  Technique:  Multiplanar, multiecho pulse sequences of the brain and surrounding structures were obtained without intravenous contrast. Angiographic images of the head were obtained using MRA technique without contrast.  Comparison:  CT 03/12/2011  MRI HEAD  Findings:  Small area of probable restricted diffusion in the left parietal  subcortical white matter and cortex.  This is most likely an area of recent infarction.  Generalized atrophy.  Chronic ischemic changes in the white matter bilaterally.  Infarct in the left frontal cortex is small and appears chronic.  Brainstem is normal.  Small area of chronic infarction left cerebellum.  Negative for intracranial hemorrhage.  Negative for mass or edema. Negative for hydrocephalus.  Sinusitis with extensive mucosal thickening left maxillary sinus and mild mucosal thickening right maxillary sinus.  Mastoid sinus effusions bilaterally.  IMPRESSION: Area of  restricted diffusion left parietal lobe compatible with a small area of acute infarction.  Atrophy and chronic ischemic changes in the white matter and left frontal cortex.  MRA HEAD  Findings: Both vertebral arteries are patent to the basilar. Basilar is widely patent.  PICA, superior cerebellar, and posterior cerebral arteries are widely patent without stenosis.  Internal carotid artery is patent bilaterally.  Anterior and middle cerebral arteries are patent bilaterally.  Negative for cerebral aneurysm.  IMPRESSION: No significant intracranial stenosis.  Original Report Authenticated By: Camelia Phenes, M.D.   Mr Brain Wo Contrast  03/12/2011  *RADIOLOGY REPORT*  Clinical Data:  Acute stroke  MRI HEAD WITHOUT CONTRAST MRA HEAD WITHOUT CONTRAST  Technique:  Multiplanar, multiecho pulse sequences of the brain and surrounding structures were obtained without intravenous contrast. Angiographic images of the head were obtained using MRA technique without contrast.  Comparison:  CT 03/12/2011  MRI HEAD  Findings:  Small area of probable restricted diffusion in the left parietal subcortical white matter and cortex.  This is most likely an area of recent infarction.  Generalized atrophy.  Chronic ischemic changes in the white matter bilaterally.  Infarct in the left frontal cortex is small and appears chronic.  Brainstem is normal.  Small area of  chronic infarction left cerebellum.  Negative for intracranial hemorrhage.  Negative for mass or edema. Negative for hydrocephalus.  Sinusitis with extensive mucosal thickening left maxillary sinus and mild mucosal thickening right maxillary sinus.  Mastoid sinus effusions bilaterally.  IMPRESSION: Area of  restricted diffusion left parietal lobe compatible with a small area of acute infarction.  Atrophy and chronic ischemic changes in the white matter and left frontal cortex.  MRA HEAD  Findings: Both vertebral arteries are patent to the basilar. Basilar is widely patent.  PICA, superior cerebellar, and posterior cerebral arteries are widely patent without stenosis.  Internal carotid artery is patent bilaterally.  Anterior and middle cerebral arteries are patent bilaterally.  Negative for cerebral aneurysm.  IMPRESSION: No significant intracranial stenosis.  Original Report Authenticated By: Camelia Phenes, M.D.     ECHO:Pending    No results found for this or any previous visit (from the past 240 hour(s)).  BRIEF ADMITTING H & P: Mr Quam is an extremely pleasant male who is quite active, has htn, was following cards outpatient for chest pain on and off. He comes  in with right sided arm and leg weakness which started around 730am, after showering. The arm and leg both feel heavy, but he says his grip is improving. Other than today's developments, patient had been doing ok serve for some chest pain being followed by Dr Stana Bunting. ? Mention of cardiac cath. Patient did not have prodromal symptoms today- no headache or dizziness, no facial droop. An MRI brain has since confirmed an area of restricted diffusion left parietal lobe compatible with a small area of acute infarction. Patient was on ASA. He has been seen by Dr Thad Ranger who recommended stroke work up and plavix. Input appreciated.   Active Hospital Problems  Diagnoses Date Noted   . CVA (cerebral infarction): He was admitted to the  telemetry unit he was out of the window for TPA. MRI was done that shows acute stroke as above. Carotid Dopplers was done that was negative. Neurology was consulted they recommended to stop the aspirin and start him on Plavix to try to decrease his risk of future strokes. A 2-D echo which is pending. He will followup with neurology as an outpatient in 2-4 weeks.  03/12/2011   . HTN (hypertension), malignant: The blood pressure was high initially on admission. But did improve without further intervention. Distress a single stopped as this affects his blood pressure and he used this for BPH and he was started on lisinopril which should help better blood pressure control.  03/12/2011     Resolved Hospital Problems  Diagnoses Date Noted Date Resolved  . Acute kidney injury: This diuretics. This resolved with IV fluids.  03/12/2011 03/13/2011     Disposition and Follow-up:  Discharge Orders    Future Appointments: Provider: Department: Dept Phone: Center:   04/06/2011 1:45 PM Verne Carrow, MD [facility] [phone] [facility]     Future Orders Please Complete By Expires   Diet - low sodium heart healthy      Increase activity slowly        Follow-up Information    Follow up with ROSS,ALLAN in 2 weeks.      Follow up with SETHI,PRAMODKUMAR P, MD in 2 weeks. (follow up)    Contact information:   [address] [facility] [place] [address] [phone]           DISCHARGE EXAM:  See progress note  Blood pressure 122/76, pulse 60, temperature 97.3 F (36.3 C), temperature source Oral, resp. rate 18, height 6\' 1"  (1.854 m), weight 92.987 kg (205 lb), SpO2 94.00%.   Basename 03/13/11 0500 03/12/11 1621 03/12/11 0950 03/12/11 0840  NA 141 -- 142 --  K 4.1 -- 4.4 --  CL 105 -- 103 --  CO2 30 -- -- 29  GLUCOSE 105* -- 105* --  BUN 20 -- 21 --  CREATININE 1.17 1.09 -- --  CALCIUM 9.3 -- -- 9.9  MG -- 1.9 -- --  PHOS --  3.0 -- --    Basename 03/13/11 0500 03/12/11 0840  AST 17 22  ALT 11 14  ALKPHOS 89 103  BILITOT 1.1 1.3*  PROT 6.2 6.9  ALBUMIN 3.2* 3.8    Basename 03/13/11 0500 03/12/11 1621 03/12/11 0840  WBC 7.9 7.2 --  NEUTROABS -- -- 3.0  HGB 13.1 13.4 --  HCT 38.0* 38.3* --  MCV 99.5 98.5 --  PLT 214 220 --    Signed: Marinda Elk M.D. 03/13/2011, 1:25 PM

## 2011-03-13 NOTE — Progress Notes (Signed)
Subjective: No complaints. Objective: Filed Vitals:   03/12/11 1753 03/12/11 2216 03/13/11 0703 03/13/11 0910  BP: 131/79 128/77 143/85 122/76  Pulse: 65 57 60 60  Temp: 97.5 F (36.4 C) 97.6 F (36.4 C) 97.5 F (36.4 C) 97.3 F (36.3 C)  TempSrc: Oral Oral Oral Oral  Resp: 18 18 18 18   Height:      Weight:      SpO2: 95% 90% 95% 94%   Weight change:   Intake/Output Summary (Last 24 hours) at 03/13/11 1247 Last data filed at 03/13/11 0600  Gross per 24 hour  Intake      0 ml  Output    425 ml  Net   -425 ml    General: Alert, awake, oriented x3, in no acute distress.  HEENT: No bruits, no goiter.  Heart: Regular rate and rhythm, without murmurs, rubs, gallops.  Lungs: Good air movement and clear to auscultation Abdomen: Soft, nontender, nondistended, positive bowel sounds.  Neuro: He is alert and oriented x3 coherent for language is left arm has a strength of 5/5 of his other extremities are 5 out of 5 he does have some facial symmetry on the right side of his face. Fine motor movement improvement.   Lab Results:  Basename 03/13/11 0500 03/12/11 1621 03/12/11 0950 03/12/11 0840  NA 141 -- 142 --  K 4.1 -- 4.4 --  CL 105 -- 103 --  CO2 30 -- -- 29  GLUCOSE 105* -- 105* --  BUN 20 -- 21 --  CREATININE 1.17 1.09 -- --  CALCIUM 9.3 -- -- 9.9  MG -- 1.9 -- --  PHOS -- 3.0 -- --    Basename 03/13/11 0500 03/12/11 0840  AST 17 22  ALT 11 14  ALKPHOS 89 103  BILITOT 1.1 1.3*  PROT 6.2 6.9  ALBUMIN 3.2* 3.8    Basename 03/13/11 0500 03/12/11 1621 03/12/11 0840  WBC 7.9 7.2 --  NEUTROABS -- -- 3.0  HGB 13.1 13.4 --  HCT 38.0* 38.3* --  MCV 99.5 98.5 --  PLT 214 220 --    Basename 03/12/11 0840  CKTOTAL --  CKMB --  CKMBINDEX --  TROPONINI <0.30    Micro Results: No results found for this or any previous visit (from the past 240 hour(s)).  Studies/Results: X-ray Chest Pa And Lateral   03/12/2011  *RADIOLOGY REPORT*  Clinical Data: Chest pain.   CHEST - 2 VIEW  Comparison: Chest 12/15/2010.  Findings: Lungs are clear.  Heart size is normal.  No pneumothorax or effusion.  IMPRESSION: No acute disease.  Original Report Authenticated By: Bernadene Bell. Maricela Curet, M.D.   Ct Head Wo Contrast  03/12/2011  *RADIOLOGY REPORT*  Clinical Data: Code stroke.  Right upper extremity weakness.  CT HEAD WITHOUT CONTRAST  Technique:  Contiguous axial images were obtained from the base of the skull through the vertex without contrast.  Comparison: None.  Findings: There is atrophy and chronic small vessel disease changes. No acute intracranial abnormality.  Specifically, no hemorrhage, hydrocephalus, mass lesion, acute infarction, or significant intracranial injury.  No acute calvarial abnormality.  Extensive disease within the left maxillary sinus.  Remainder of the paranasal sinuses are clear as are the mastoids.  IMPRESSION: No acute intracranial abnormality.  Atrophy, chronic microvascular disease.  Critical Value/emergent results were called by telephone at the time of interpretation on 03/12/2011  at 8:45 a.m.  to  Dr. Lynelle Doctor, who verbally acknowledged these results.  Original Report Authenticated By: Aubery Lapping  Kearney Hard, M.D.   Mr Angiogram Head Wo Contrast  03/12/2011  *RADIOLOGY REPORT*  Clinical Data:  Acute stroke  MRI HEAD WITHOUT CONTRAST MRA HEAD WITHOUT CONTRAST  Technique:  Multiplanar, multiecho pulse sequences of the brain and surrounding structures were obtained without intravenous contrast. Angiographic images of the head were obtained using MRA technique without contrast.  Comparison:  CT 03/12/2011  MRI HEAD  Findings:  Small area of probable restricted diffusion in the left parietal subcortical white matter and cortex.  This is most likely an area of recent infarction.  Generalized atrophy.  Chronic ischemic changes in the white matter bilaterally.  Infarct in the left frontal cortex is small and appears chronic.  Brainstem is normal.  Small area of  chronic infarction left cerebellum.  Negative for intracranial hemorrhage.  Negative for mass or edema. Negative for hydrocephalus.  Sinusitis with extensive mucosal thickening left maxillary sinus and mild mucosal thickening right maxillary sinus.  Mastoid sinus effusions bilaterally.  IMPRESSION: Area of  restricted diffusion left parietal lobe compatible with a small area of acute infarction.  Atrophy and chronic ischemic changes in the white matter and left frontal cortex.  MRA HEAD  Findings: Both vertebral arteries are patent to the basilar. Basilar is widely patent.  PICA, superior cerebellar, and posterior cerebral arteries are widely patent without stenosis.  Internal carotid artery is patent bilaterally.  Anterior and middle cerebral arteries are patent bilaterally.  Negative for cerebral aneurysm.  IMPRESSION: No significant intracranial stenosis.  Original Report Authenticated By: Camelia Phenes, M.D.   Mr Brain Wo Contrast  03/12/2011  *RADIOLOGY REPORT*  Clinical Data:  Acute stroke  MRI HEAD WITHOUT CONTRAST MRA HEAD WITHOUT CONTRAST  Technique:  Multiplanar, multiecho pulse sequences of the brain and surrounding structures were obtained without intravenous contrast. Angiographic images of the head were obtained using MRA technique without contrast.  Comparison:  CT 03/12/2011  MRI HEAD  Findings:  Small area of probable restricted diffusion in the left parietal subcortical white matter and cortex.  This is most likely an area of recent infarction.  Generalized atrophy.  Chronic ischemic changes in the white matter bilaterally.  Infarct in the left frontal cortex is small and appears chronic.  Brainstem is normal.  Small area of chronic infarction left cerebellum.  Negative for intracranial hemorrhage.  Negative for mass or edema. Negative for hydrocephalus.  Sinusitis with extensive mucosal thickening left maxillary sinus and mild mucosal thickening right maxillary sinus.  Mastoid sinus effusions  bilaterally.  IMPRESSION: Area of  restricted diffusion left parietal lobe compatible with a small area of acute infarction.  Atrophy and chronic ischemic changes in the white matter and left frontal cortex.  MRA HEAD  Findings: Both vertebral arteries are patent to the basilar. Basilar is widely patent.  PICA, superior cerebellar, and posterior cerebral arteries are widely patent without stenosis.  Internal carotid artery is patent bilaterally.  Anterior and middle cerebral arteries are patent bilaterally.  Negative for cerebral aneurysm.  IMPRESSION: No significant intracranial stenosis.  Original Report Authenticated By: Camelia Phenes, M.D.    Medications: I have reviewed the patient's current medications.   Active Problems:  CVA (cerebral infarction)  HTN (hypertension), malignant  Acute kidney injury    Assessment and plan: -MRI showed:Area of restricted diffusion left parietal lobe compatible with a small area of acute infarction. Blood pressure currently well controlled: Hemoglobin A1c pending, continue statin, Plavix.  - 2-D echo  pending and carotid Dopplers no ICA stenosis. Physical  therapy no further recommendation.Marland Kitchen  -AKI resolved, restart HCTZ   LOS: 1 day   Marinda Elk M.D. Pager: [phone] [facility] 03/13/2011, 12:47 PM

## 2011-03-13 NOTE — Progress Notes (Addendum)
PT Note  New order for PT received. Patient was evaluated this am and has no PT needs as he is independent with all activity  I do recommend OT consult secondary to UE deficits.  03/13/2011 Edwyna Perfect, PT  Pager [phone]

## 2011-03-18 ENCOUNTER — Encounter (HOSPITAL_COMMUNITY): Payer: Self-pay | Admitting: *Deleted

## 2011-03-18 ENCOUNTER — Emergency Department (HOSPITAL_COMMUNITY): Payer: Medicare Other

## 2011-03-18 ENCOUNTER — Emergency Department (HOSPITAL_COMMUNITY)
Admission: EM | Admit: 2011-03-18 | Discharge: 2011-03-18 | Disposition: A | Discharge status: Home / Self Care | Payer: Medicare Other | Attending: Emergency Medicine | Admitting: Emergency Medicine

## 2011-03-18 ENCOUNTER — Other Ambulatory Visit: Payer: Self-pay

## 2011-03-18 DIAGNOSIS — I69998 Other sequelae following unspecified cerebrovascular disease: Secondary | ICD-10-CM | POA: Insufficient documentation

## 2011-03-18 DIAGNOSIS — R209 Unspecified disturbances of skin sensation: Secondary | ICD-10-CM | POA: Insufficient documentation

## 2011-03-18 DIAGNOSIS — R29898 Other symptoms and signs involving the musculoskeletal system: Secondary | ICD-10-CM | POA: Insufficient documentation

## 2011-03-18 DIAGNOSIS — R200 Anesthesia of skin: Secondary | ICD-10-CM

## 2011-03-18 DIAGNOSIS — I10 Essential (primary) hypertension: Secondary | ICD-10-CM | POA: Insufficient documentation

## 2011-03-18 LAB — URINALYSIS, ROUTINE W REFLEX MICROSCOPIC
Bilirubin Urine: NEGATIVE
Hgb urine dipstick: NEGATIVE
Ketones, ur: NEGATIVE mg/dL
Nitrite: NEGATIVE
Specific Gravity, Urine: 1.012 (ref 1.005–1.030)
pH: 7.5 (ref 5.0–8.0)

## 2011-03-18 LAB — DIFFERENTIAL
Basophils Absolute: 0 10*3/uL (ref 0.0–0.1)
Basophils Relative: 1 % (ref 0–1)
Eosinophils Absolute: 0.3 10*3/uL (ref 0.0–0.7)
Eosinophils Relative: 4 % (ref 0–5)
Lymphocytes Relative: 35 % (ref 12–46)
Lymphs Abs: 2.3 10*3/uL (ref 0.7–4.0)
Monocytes Absolute: 0.8 10*3/uL (ref 0.1–1.0)
Monocytes Relative: 13 % — ABNORMAL HIGH (ref 3–12)
Neutro Abs: 3.2 10*3/uL (ref 1.7–7.7)
Neutrophils Relative %: 48 % (ref 43–77)

## 2011-03-18 LAB — CBC
HCT: 35.5 % — ABNORMAL LOW (ref 39.0–52.0)
Hemoglobin: 12.5 g/dL — ABNORMAL LOW (ref 13.0–17.0)
MCH: 34.2 pg — ABNORMAL HIGH (ref 26.0–34.0)
MCHC: 35.2 g/dL (ref 30.0–36.0)
RBC: 3.66 MIL/uL — ABNORMAL LOW (ref 4.22–5.81)

## 2011-03-18 LAB — BASIC METABOLIC PANEL
BUN: 18 mg/dL (ref 6–23)
CO2: 28 mEq/L (ref 19–32)
Calcium: 9.4 mg/dL (ref 8.4–10.5)
Chloride: 103 mEq/L (ref 96–112)
Creatinine, Ser: 1.16 mg/dL (ref 0.50–1.35)
GFR calc Af Amer: 68 mL/min — ABNORMAL LOW (ref >90)
GFR calc non Af Amer: 58 mL/min — ABNORMAL LOW (ref >90)
Glucose, Bld: 104 mg/dL — ABNORMAL HIGH (ref 70–99)
Potassium: 3.6 mEq/L (ref 3.5–5.1)
Sodium: 138 mEq/L (ref 135–145)

## 2011-03-18 NOTE — ED Notes (Signed)
Phlebotomy ion room

## 2011-03-18 NOTE — ED Notes (Signed)
Pt states understanding of discharge instruction given 

## 2011-03-18 NOTE — ED Notes (Signed)
Pt reports being admitted last Sunday for cva. Reports onset today of mild weakness to bilateral legs, "feels like legs are asleep" started at 1530.  reports being able to ambulate, no other neuro deficits noted at triage.

## 2011-03-18 NOTE — ED Provider Notes (Signed)
History     CSN: [id number]  Arrival date & time 03/18/11  1743   First MD Initiated Contact with Patient 03/18/11 1816      Chief Complaint  Patient presents with  . Extremity Weakness    (Consider location/radiation/quality/duration/timing/severity/associated sxs/prior treatment) HPI Comments: Patient is a 77 year old man who 6 days ago had a stroke affecting the left side of his brain, resulting in weakness in the right arm. He was observed overnight at [facility], and was placed on Plavix. He didn't receive TPA because his symptoms were improving. He had been doing well until today at 3:30 PM when he noticed a numb feeling in his legs. It is like a tingling in both legs. There is also a little bit of this feeling in the left arm. He therefore sought evaluation. He says he can walk all right. He says that if he had had a prior stroke he probably would not have come to the ED. Past history her hypertension. He is also on medication for cholesterol.  Patient is a 77 y.o. male presenting with extremity weakness and neurologic complaint.  Extremity Weakness  Neurologic Problem The primary symptoms include paresthesias. Primary symptoms do not include fever. The symptoms began 1 to 2 hours ago. The symptoms are unchanged. The neurological symptoms are focal.  Associated medical issues comments: Recent stroke.. Workup history includes MRI and CT scan.    Past Medical History  Diagnosis Date  . Ramsay Hunt auricular syndrome   . HTN (hypertension)   . Osteoarthritis     Past Surgical History  Procedure Date  . Hernia repair   . Vasectomy   . Prostate biopsies     per Vonita Moss  . Cataract extraction     SE eye    Family History  Problem Relation Age of Onset  . Cancer Father   . Diabetes Father     History  Substance Use Topics  . Smoking status: Former Smoker    Quit date: 06/22/1984  . Smokeless tobacco: Never Used  . Alcohol Use: 3.5 oz/week    7 drink(s)  per week     1 glass of wine a day      Review of Systems  Constitutional: Negative.  Negative for fever.  HENT: Negative.   Eyes: Negative.   Respiratory: Negative.   Cardiovascular: Negative.   Gastrointestinal: Negative.   Musculoskeletal: Positive for extremity weakness.  Skin: Negative.   Neurological: Positive for numbness and paresthesias.    Allergies  Penicillins  Home Medications   Current Outpatient Rx  Name Route Sig Dispense Refill  . OCUVITE PO TABS Oral Take 1 tablet by mouth 2 (two) times daily.      Marland Kitchen VITAMIN D-3 PO Oral Take 5,000 Units by mouth daily.      Marland Kitchen CLOPIDOGREL BISULFATE 75 MG PO TABS Oral Take 1 tablet (75 mg total) by mouth daily with breakfast. 30 tablet 0  . DILTIAZEM HCL ER COATED BEADS 120 MG PO CP24 Oral Take 120 mg by mouth every morning.     Marland Kitchen GABAPENTIN 600 MG PO TABS Oral Take 600 mg by mouth 2 (two) times daily.     Marland Kitchen HYDROCHLOROTHIAZIDE 25 MG PO TABS Oral Take 25 mg by mouth every morning.     Marland Kitchen LISINOPRIL 5 MG PO TABS Oral Take 1 tablet (5 mg total) by mouth daily. 30 tablet 0  . FISH OIL 1000 MG PO CAPS Oral Take 1 capsule by mouth 2 (two)  times daily.     Marland Kitchen OMEPRAZOLE 20 MG PO CPDR Oral Take 1 capsule by mouth at bedtime.     Marland Kitchen VITAMIN B-6 100 MG PO TABS Oral Take 100 mg by mouth every morning.     Marland Kitchen ROSUVASTATIN CALCIUM 10 MG PO TABS Oral Take 1 tablet (10 mg total) by mouth daily at 6 PM. 30 tablet 0  . SILDENAFIL CITRATE 100 MG PO TABS Oral Take 100 mg by mouth as needed. For erectile dysfunction.    Marland Kitchen TAMSULOSIN HCL 0.4 MG PO CAPS Oral Take 1 capsule (0.4 mg total) by mouth daily after supper. 30 capsule 0    BP 127/71  Pulse 60  Temp(Src) 97.9 F (36.6 C) (Oral)  Resp 16  SpO2 94%  Physical Exam  Nursing note and vitals reviewed. Constitutional: He is oriented to person, place, and time. He appears well-developed and well-nourished. No distress.  HENT:  Head: Normocephalic and atraumatic.  Right Ear: External ear  normal.  Left Ear: External ear normal.  Mouth/Throat: Oropharynx is clear and moist.  Eyes: Conjunctivae and EOM are normal. Pupils are equal, round, and reactive to light. No scleral icterus.  Neck: Normal range of motion. Neck supple.  Cardiovascular: Normal rate, regular rhythm and normal heart sounds.   Pulmonary/Chest: Effort normal and breath sounds normal.  Abdominal: Soft. Bowel sounds are normal.  Musculoskeletal: Normal range of motion.  Neurological: He is alert and oriented to person, place, and time.       NO sensory or motor deficits.  He has a qualitative numb feeling in the legs and a little bit in the  Larm,  Skin: Skin is warm and dry.  Psychiatric: He has a normal mood and affect. His behavior is normal.    ED Course  Procedures (including critical care time)   6:39 PM Pt seen --> physical exam performed.  Case discussed with neurology hospitalist, who recommended lab tests to do, but did not think this was a new stroke and did not think additional imaging was needed.    Date: 03/18/2011  Rate: 52  Rhythm: sinus bradycardia  QRS Axis: normal  Intervals: PR prolonged  ST/T Wave abnormalities: normal  Conduction Disutrbances:nonspecific intraventricular conduction delay  Narrative Interpretation: Borderline EKG.  Old EKG Reviewed: unchanged since tracing of 03/12/2011.  9:07 PM Results for orders placed during the hospital encounter of 03/18/11  CBC      Component Value Range   WBC 6.6  4.0 - 10.5 (K/uL)   RBC 3.66 (*) 4.22 - 5.81 (MIL/uL)   Hemoglobin 12.5 (*) 13.0 - 17.0 (g/dL)   HCT 47.8 (*) 29.5 - 52.0 (%)   MCV 97.0  78.0 - 100.0 (fL)   MCH 34.2 (*) 26.0 - 34.0 (pg)   MCHC 35.2  30.0 - 36.0 (g/dL)   RDW 62.1  30.8 - 65.7 (%)   Platelets 207  150 - 400 (K/uL)  DIFFERENTIAL      Component Value Range   Neutrophils Relative 48  43 - 77 (%)   Neutro Abs 3.2  1.7 - 7.7 (K/uL)   Lymphocytes Relative 35  12 - 46 (%)   Lymphs Abs 2.3  0.7 - 4.0 (K/uL)     Monocytes Relative 13 (*) 3 - 12 (%)   Monocytes Absolute 0.8  0.1 - 1.0 (K/uL)   Eosinophils Relative 4  0 - 5 (%)   Eosinophils Absolute 0.3  0.0 - 0.7 (K/uL)   Basophils Relative 1  0 -  1 (%)   Basophils Absolute 0.0  0.0 - 0.1 (K/uL)  BASIC METABOLIC PANEL      Component Value Range   Sodium 138  135 - 145 (mEq/L)   Potassium 3.6  3.5 - 5.1 (mEq/L)   Chloride 103  96 - 112 (mEq/L)   CO2 28  19 - 32 (mEq/L)   Glucose, Bld 104 (*) 70 - 99 (mg/dL)   BUN 18  6 - 23 (mg/dL)   Creatinine, Ser 1.61  0.50 - 1.35 (mg/dL)   Calcium 9.4  8.4 - 09.6 (mg/dL)   GFR calc non Af Amer 58 (*) >90 (mL/min)   GFR calc Af Amer 68 (*) >90 (mL/min)  URINALYSIS, ROUTINE W REFLEX MICROSCOPIC      Component Value Range   Color, Urine YELLOW  YELLOW    APPearance CLEAR  CLEAR    Specific Gravity, Urine 1.012  1.005 - 1.030    pH 7.5  5.0 - 8.0    Glucose, UA NEGATIVE  NEGATIVE (mg/dL)   Hgb urine dipstick NEGATIVE  NEGATIVE    Bilirubin Urine NEGATIVE  NEGATIVE    Ketones, ur NEGATIVE  NEGATIVE (mg/dL)   Protein, ur NEGATIVE  NEGATIVE (mg/dL)   Urobilinogen, UA 0.2  0.0 - 1.0 (mg/dL)   Nitrite NEGATIVE  NEGATIVE    Leukocytes, UA NEGATIVE  NEGATIVE    Dg Chest 2 View  03/18/2011  *RADIOLOGY REPORT*  Clinical Data: Leg numbness  CHEST - 2 VIEW  Comparison: 03/12/2011  Findings: Cardiomediastinal silhouette is stable. No acute infiltrate or pleural effusion.  No pulmonary edema.  Bony thorax is stable.  IMPRESSION: No active disease.  No significant change  Original Report Authenticated By: Natasha Mead, M.D.   X-ray Chest Pa And Lateral   03/12/2011  *RADIOLOGY REPORT*  Clinical Data: Chest pain.  CHEST - 2 VIEW  Comparison: Chest 12/15/2010.  Findings: Lungs are clear.  Heart size is normal.  No pneumothorax or effusion.  IMPRESSION: No acute disease.  Original Report Authenticated By: Bernadene Bell. Maricela Curet, M.D.   Ct Head Wo Contrast  03/12/2011  *RADIOLOGY REPORT*  Clinical Data: Code stroke.   Right upper extremity weakness.  CT HEAD WITHOUT CONTRAST  Technique:  Contiguous axial images were obtained from the base of the skull through the vertex without contrast.  Comparison: None.  Findings: There is atrophy and chronic small vessel disease changes. No acute intracranial abnormality.  Specifically, no hemorrhage, hydrocephalus, mass lesion, acute infarction, or significant intracranial injury.  No acute calvarial abnormality.  Extensive disease within the left maxillary sinus.  Remainder of the paranasal sinuses are clear as are the mastoids.  IMPRESSION: No acute intracranial abnormality.  Atrophy, chronic microvascular disease.  Critical Value/emergent results were called by telephone at the time of interpretation on 03/12/2011  at 8:45 a.m.  to  Dr. Lynelle Doctor, who verbally acknowledged these results.  Original Report Authenticated By: Cyndie Chime, M.D.   Mr Angiogram Head Wo Contrast  03/12/2011  *RADIOLOGY REPORT*  Clinical Data:  Acute stroke  MRI HEAD WITHOUT CONTRAST MRA HEAD WITHOUT CONTRAST  Technique:  Multiplanar, multiecho pulse sequences of the brain and surrounding structures were obtained without intravenous contrast. Angiographic images of the head were obtained using MRA technique without contrast.  Comparison:  CT 03/12/2011  MRI HEAD  Findings:  Small area of probable restricted diffusion in the left parietal subcortical white matter and cortex.  This is most likely an area of recent infarction.  Generalized atrophy.  Chronic ischemic changes in the white matter bilaterally.  Infarct in the left frontal cortex is small and appears chronic.  Brainstem is normal.  Small area of chronic infarction left cerebellum.  Negative for intracranial hemorrhage.  Negative for mass or edema. Negative for hydrocephalus.  Sinusitis with extensive mucosal thickening left maxillary sinus and mild mucosal thickening right maxillary sinus.  Mastoid sinus effusions bilaterally.  IMPRESSION: Area of   restricted diffusion left parietal lobe compatible with a small area of acute infarction.  Atrophy and chronic ischemic changes in the white matter and left frontal cortex.  MRA HEAD  Findings: Both vertebral arteries are patent to the basilar. Basilar is widely patent.  PICA, superior cerebellar, and posterior cerebral arteries are widely patent without stenosis.  Internal carotid artery is patent bilaterally.  Anterior and middle cerebral arteries are patent bilaterally.  Negative for cerebral aneurysm.  IMPRESSION: No significant intracranial stenosis.  Original Report Authenticated By: Camelia Phenes, M.D.   Mr Brain Wo Contrast  03/12/2011  *RADIOLOGY REPORT*  Clinical Data:  Acute stroke  MRI HEAD WITHOUT CONTRAST MRA HEAD WITHOUT CONTRAST  Technique:  Multiplanar, multiecho pulse sequences of the brain and surrounding structures were obtained without intravenous contrast. Angiographic images of the head were obtained using MRA technique without contrast.  Comparison:  CT 03/12/2011  MRI HEAD  Findings:  Small area of probable restricted diffusion in the left parietal subcortical white matter and cortex.  This is most likely an area of recent infarction.  Generalized atrophy.  Chronic ischemic changes in the white matter bilaterally.  Infarct in the left frontal cortex is small and appears chronic.  Brainstem is normal.  Small area of chronic infarction left cerebellum.  Negative for intracranial hemorrhage.  Negative for mass or edema. Negative for hydrocephalus.  Sinusitis with extensive mucosal thickening left maxillary sinus and mild mucosal thickening right maxillary sinus.  Mastoid sinus effusions bilaterally.  IMPRESSION: Area of  restricted diffusion left parietal lobe compatible with a small area of acute infarction.  Atrophy and chronic ischemic changes in the white matter and left frontal cortex.  MRA HEAD  Findings: Both vertebral arteries are patent to the basilar. Basilar is widely patent.   PICA, superior cerebellar, and posterior cerebral arteries are widely patent without stenosis.  Internal carotid artery is patent bilaterally.  Anterior and middle cerebral arteries are patent bilaterally.  Negative for cerebral aneurysm.  IMPRESSION: No significant intracranial stenosis.  Original Report Authenticated By: Camelia Phenes, M.D.   9:08 PM Lab tests were negative this evening.  Pt can safely go home.  He should followup with Dr. Duane Lope, his family physician.  He should also have followup with Dr. Delia Heady, stroke neurologist.    1. Numbness of legs            Carleene Cooper III, MD 03/18/11 2122

## 2011-03-22 LAB — PROTEIN ELECTROPHORESIS, SERUM
Beta 2: 4 % (ref 3.2–6.5)
Gamma Globulin: 14.5 % (ref 11.1–18.8)

## 2011-04-06 ENCOUNTER — Encounter: Payer: Self-pay | Admitting: Cardiovascular Disease

## 2011-04-06 ENCOUNTER — Ambulatory Visit (INDEPENDENT_AMBULATORY_CARE_PROVIDER_SITE_OTHER): Payer: Medicare Other | Admitting: Cardiovascular Disease

## 2011-04-06 DIAGNOSIS — R079 Chest pain, unspecified: Secondary | ICD-10-CM

## 2011-04-06 NOTE — Assessment & Plan Note (Signed)
No recurrence. He has a low risk stress test last year. He will call if he has chest pain or SOB.

## 2011-04-06 NOTE — Patient Instructions (Signed)
Your physician wants you to follow-up in: 12 months.  You will receive a reminder letter in the mail two months in advance. If you don't receive a letter, please call our office to schedule the follow-up appointment.  Your physician recommends that you continue on your current medications as directed. Please refer to the Current Medication list given to you today.   

## 2011-04-06 NOTE — Progress Notes (Signed)
History of Present Illness: 78 yo WM with history of HTN, BPH, former tobacco abuse (stopped 1985), OA here today for cardiac follow up. I saw him in April 2012 to establish cardiology care. He gest some of his medications at the [place] in [place]. His primary care is with [facility] Primary care Dr. Tenny Craw. He told me that he had a CT scan of the chest in November 2011 for f/u of pulmonary nodules. His coronary arteries showed calcification. He described exertional chest pain at our first visit. I ordered an echo and An exercise stress myoview. His myoview showed normal perfusion with small apical defect. He exercised for 12 minutes with no chest pain and no EKG changes. Echo with normal LV function, biatrial enlargement, no significant valvular issues. I saw him in May and recommended conservative approach as he was feeling well and having no chest pain. I saw him in November 2012 for follow up and he c/o occasional "twitch" in his chest. This is usually when his is under stress. He also feels that his legs are week. No exertional chest pain. He was seen in the ED in September 2012 for chest pain and had negative tests. He had also seen his primary doctor and had plans for GI workup.   He is here today for follow up. He tells me that he had a CVA on 03/12/11 and was [facility]. Echo with normal LV function. Carotid artery dopplers are normal. He describes right sided arm and leg weakness. He has completely recovered neurologically. No SOB. His GI workup went well and was normal per patient. He was started on Plavix in the hospital.   His primary care is Dr. Gildardo Cranker.    Past Medical History  Diagnosis Date  . Ramsay Hunt auricular syndrome   . HTN (hypertension)   . Osteoarthritis     Past Surgical History  Procedure Date  . Hernia repair   . Vasectomy   . Prostate biopsies     per Vonita Moss  . Cataract extraction     SE eye    Current Outpatient Prescriptions  Medication Sig Dispense  Refill  . b complex vitamins capsule Take 1 capsule by mouth daily.      . beta carotene w/minerals (OCUVITE) tablet Take 1 tablet by mouth 2 (two) times daily.        . Cholecalciferol (VITAMIN D-3 PO) Take 5,000 Units by mouth daily.        . clopidogrel (PLAVIX) 75 MG tablet Take 1 tablet (75 mg total) by mouth daily with breakfast.  30 tablet  0  . diltiazem (CARDIZEM CD) 120 MG 24 hr capsule Take 120 mg by mouth every morning.       . gabapentin (NEURONTIN) 600 MG tablet Take 600 mg by mouth 2 (two) times daily.       . hydrochlorothiazide 25 MG tablet Take 25 mg by mouth every morning.       Marland Kitchen lisinopril (PRINIVIL,ZESTRIL) 5 MG tablet Take 1 tablet (5 mg total) by mouth daily.  30 tablet  0  . Omega-3 Fatty Acids (FISH OIL) 1000 MG CAPS Take 1 capsule by mouth 2 (two) times daily.       Marland Kitchen omeprazole (PRILOSEC) 20 MG capsule Take 1 capsule by mouth at bedtime.       . rosuvastatin (CRESTOR) 10 MG tablet Take 1 tablet (10 mg total) by mouth daily at 6 PM.  30 tablet  0  . Tamsulosin HCl (  FLOMAX) 0.4 MG CAPS Take 1 capsule (0.4 mg total) by mouth daily after supper.  30 capsule  0    Allergies  Allergen Reactions  . Penicillins Rash    Rash appeared on palms of hands and feet.    History   Social History  . Marital Status: Married    Spouse Name: Harriett Sine    Number of Children: 2  . Years of Education: N/A   Occupational History  . 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   Social History Main Topics  . Smoking status: Former Smoker    Quit date: 06/22/1984  . Smokeless tobacco: Never Used  . Alcohol Use: 3.5 oz/week    7 drink(s) per week     1 glass of wine a day  . Drug Use: No  . Sexually Active: Not on file   Other Topics Concern  . Not on file   Social History Narrative   Health Care POA: wife, Harriett Sine RandalEmergency Contact: Thereasa Parkin, (c) [phone] of Life Plan: DNRWho lives with you: Lives with wife, Johnsie Cancel pets: coon hound, CookieDiet:  Patient has a varied diet of protein, starch, vegetables.Exercise: Patient walks or does yoga 5 x a week.Seatbelts: Patient reports wearing a seat belt when in vehicle.Sun Exposure/Protection: Patient reports infrequent use of sun screen.Hobbies: Walking, running, politics    Family History  Problem Relation Age of Onset  . Cancer Father   . Diabetes Father     Review of Systems:  As stated in the HPI and otherwise negative.   BP 120/74  Pulse 65  Ht 6\' 1"  (1.854 m)  Wt 212 lb (96.163 kg)  BMI 27.97 kg/m2  Physical Examination: General: Well developed, well nourished, NAD HEENT: OP clear, mucus membranes moist SKIN: warm, dry. No rashes. Neuro: No focal deficits Musculoskeletal: Muscle strength 5/5 all ext Psychiatric: Mood and affect normal Neck: No JVD, no carotid bruits, no thyromegaly, no lymphadenopathy. Lungs:Clear bilaterally, no wheezes, rhonci, crackles Cardiovascular: Regular rate and rhythm. No murmurs, gallops or rubs. Abdomen:Soft. Bowel sounds present. Non-tender.  Extremities: No lower extremity edema. Pulses are 2 + in the bilateral DP/PT.

## 2011-10-03 ENCOUNTER — Other Ambulatory Visit: Payer: Self-pay | Admitting: Sports Medicine

## 2011-11-02 ENCOUNTER — Ambulatory Visit: Payer: Medicare Other | Admitting: Sports Medicine

## 2011-11-08 ENCOUNTER — Ambulatory Visit (INDEPENDENT_AMBULATORY_CARE_PROVIDER_SITE_OTHER): Payer: Medicare Other | Admitting: Sports Medicine

## 2011-11-08 VITALS — BP 130/70 | Ht 73.0 in | Wt 201.0 lb

## 2011-11-08 DIAGNOSIS — M545 Low back pain, unspecified: Secondary | ICD-10-CM

## 2011-11-08 MED ORDER — GABAPENTIN 600 MG PO TABS: 600.0000 mg | ORAL_TABLET | Freq: Two times a day (BID) | ORAL | Status: DC

## 2011-11-08 NOTE — Assessment & Plan Note (Signed)
This has done very well Reminded to keep up some back stretches and yoga at least 3x week to lessen recurrences  Renew Gabapentin and can take 1 to 3x per day pending amount of sciatic irritation  At present usually only takes 1 per day  Reck yearly and prn

## 2011-11-08 NOTE — Patient Instructions (Addendum)
Do back stretches and exercises 3 times per week to help prevent flare of back pain  Continue current gabapentin regimen   Follow up as needed  Thank you for seeing [place] today!

## 2011-11-08 NOTE — Progress Notes (Signed)
  Subjective:    Patient ID: [name] [name], male    DOB: 11-23-1930, 78 y.o.   MRN: [id number]  HPI  Pt presents to clinic for f/u of medications Taking gabapentin 600 mg qd in the mornings. This is working well for controlling back pain.  No flares of back pain in 14 mos now  Had stroke that caused rt arm and partial rt leg numbness- reversed in 2-3 hours.  Dx at [facility] 02/2011. Taking plavix since then.  Has regained strength well.    Review of Systems     Objective:   Physical Exam  Back exam: Full flexion  20 deg back extension Lateral bend- 20 deg to rt, 15 deg to lt Rotation - 50% reduced bilat Toe, heel, cross over walk normal  Back position normal Straight leg raise neg FABER normal bilat        Assessment & Plan:

## 2012-01-04 ENCOUNTER — Other Ambulatory Visit: Payer: Self-pay | Admitting: Family Medicine

## 2012-01-04 DIAGNOSIS — R1013 Epigastric pain: Secondary | ICD-10-CM

## 2012-01-12 ENCOUNTER — Ambulatory Visit
Admission: RE | Admit: 2012-01-12 | Discharge: 2012-01-12 | Disposition: A | Discharge status: Home / Self Care | Payer: Medicare Other | Source: Ambulatory Visit | Attending: Family Medicine | Admitting: Family Medicine

## 2012-01-12 DIAGNOSIS — R1013 Epigastric pain: Secondary | ICD-10-CM

## 2012-02-07 ENCOUNTER — Other Ambulatory Visit (HOSPITAL_COMMUNITY): Payer: Self-pay | Admitting: Gastroenterology

## 2012-02-07 DIAGNOSIS — K2900 Acute gastritis without bleeding: Secondary | ICD-10-CM

## 2012-02-07 DIAGNOSIS — R634 Abnormal weight loss: Secondary | ICD-10-CM

## 2012-02-16 ENCOUNTER — Encounter (HOSPITAL_COMMUNITY)
Admission: RE | Admit: 2012-02-16 | Discharge: 2012-02-16 | Disposition: A | Discharge status: Home / Self Care | Payer: Medicare Other | Source: Ambulatory Visit | Attending: Gastroenterology | Admitting: Gastroenterology

## 2012-02-16 DIAGNOSIS — K2990 Gastroduodenitis, unspecified, without bleeding: Secondary | ICD-10-CM | POA: Insufficient documentation

## 2012-02-16 DIAGNOSIS — R634 Abnormal weight loss: Secondary | ICD-10-CM

## 2012-02-16 DIAGNOSIS — K2970 Gastritis, unspecified, without bleeding: Secondary | ICD-10-CM | POA: Insufficient documentation

## 2012-02-16 DIAGNOSIS — K3189 Other diseases of stomach and duodenum: Secondary | ICD-10-CM | POA: Insufficient documentation

## 2012-02-16 DIAGNOSIS — K2900 Acute gastritis without bleeding: Secondary | ICD-10-CM

## 2012-02-16 MED ORDER — TECHNETIUM TC 99M SULFUR COLLOID
2.0000 | Freq: Once | INTRAVENOUS | Status: AC | PRN
  Administered 2012-02-16: 2 via INTRAVENOUS

## 2012-04-17 ENCOUNTER — Emergency Department (HOSPITAL_COMMUNITY): Payer: Medicare Other

## 2012-04-17 ENCOUNTER — Encounter (HOSPITAL_COMMUNITY): Payer: Self-pay | Admitting: *Deleted

## 2012-04-17 ENCOUNTER — Observation Stay (HOSPITAL_COMMUNITY)
Admission: EM | Admit: 2012-04-17 | Discharge: 2012-04-18 | DRG: 066 | Disposition: A | Discharge status: Home / Self Care | Payer: Medicare Other | Attending: Family Medicine | Admitting: Family Medicine

## 2012-04-17 DIAGNOSIS — I499 Cardiac arrhythmia, unspecified: Secondary | ICD-10-CM

## 2012-04-17 DIAGNOSIS — R4701 Aphasia: Secondary | ICD-10-CM | POA: Insufficient documentation

## 2012-04-17 DIAGNOSIS — K5730 Diverticulosis of large intestine without perforation or abscess without bleeding: Secondary | ICD-10-CM

## 2012-04-17 DIAGNOSIS — G609 Hereditary and idiopathic neuropathy, unspecified: Secondary | ICD-10-CM

## 2012-04-17 DIAGNOSIS — R209 Unspecified disturbances of skin sensation: Secondary | ICD-10-CM

## 2012-04-17 DIAGNOSIS — F17200 Nicotine dependence, unspecified, uncomplicated: Secondary | ICD-10-CM

## 2012-04-17 DIAGNOSIS — I4891 Unspecified atrial fibrillation: Secondary | ICD-10-CM | POA: Diagnosis present

## 2012-04-17 DIAGNOSIS — M7750 Other enthesopathy of unspecified foot: Secondary | ICD-10-CM

## 2012-04-17 DIAGNOSIS — R4789 Other speech disturbances: Secondary | ICD-10-CM | POA: Insufficient documentation

## 2012-04-17 DIAGNOSIS — M545 Low back pain: Secondary | ICD-10-CM

## 2012-04-17 DIAGNOSIS — F40298 Other specified phobia: Secondary | ICD-10-CM | POA: Insufficient documentation

## 2012-04-17 DIAGNOSIS — E785 Hyperlipidemia, unspecified: Secondary | ICD-10-CM | POA: Insufficient documentation

## 2012-04-17 DIAGNOSIS — B0221 Postherpetic geniculate ganglionitis: Secondary | ICD-10-CM | POA: Insufficient documentation

## 2012-04-17 DIAGNOSIS — M25462 Effusion, left knee: Secondary | ICD-10-CM

## 2012-04-17 DIAGNOSIS — R29810 Facial weakness: Secondary | ICD-10-CM | POA: Insufficient documentation

## 2012-04-17 DIAGNOSIS — G459 Transient cerebral ischemic attack, unspecified: Principal | ICD-10-CM | POA: Diagnosis present

## 2012-04-17 DIAGNOSIS — I639 Cerebral infarction, unspecified: Secondary | ICD-10-CM | POA: Diagnosis present

## 2012-04-17 DIAGNOSIS — Z79899 Other long term (current) drug therapy: Secondary | ICD-10-CM | POA: Insufficient documentation

## 2012-04-17 DIAGNOSIS — Z8673 Personal history of transient ischemic attack (TIA), and cerebral infarction without residual deficits: Secondary | ICD-10-CM | POA: Insufficient documentation

## 2012-04-17 DIAGNOSIS — I10 Essential (primary) hypertension: Secondary | ICD-10-CM | POA: Diagnosis present

## 2012-04-17 DIAGNOSIS — K3184 Gastroparesis: Secondary | ICD-10-CM | POA: Diagnosis present

## 2012-04-17 DIAGNOSIS — M5430 Sciatica, unspecified side: Secondary | ICD-10-CM

## 2012-04-17 DIAGNOSIS — F5232 Male orgasmic disorder: Secondary | ICD-10-CM

## 2012-04-17 DIAGNOSIS — M25562 Pain in left knee: Secondary | ICD-10-CM

## 2012-04-17 HISTORY — DX: Personal history of other diseases of the nervous system and sense organs: Z86.69

## 2012-04-17 HISTORY — DX: Cerebral infarction, unspecified: I63.9

## 2012-04-17 HISTORY — DX: Cardiac arrhythmia, unspecified: I49.9

## 2012-04-17 HISTORY — DX: Anxiety disorder, unspecified: F41.9

## 2012-04-17 LAB — CBC WITH DIFFERENTIAL/PLATELET
Basophils Absolute: 0 10*3/uL (ref 0.0–0.1)
Basophils Relative: 0 % (ref 0–1)
Eosinophils Relative: 2 % (ref 0–5)
HCT: 35.4 % — ABNORMAL LOW (ref 39.0–52.0)
Hemoglobin: 12.3 g/dL — ABNORMAL LOW (ref 13.0–17.0)
Lymphocytes Relative: 19 % (ref 12–46)
MCHC: 34.7 g/dL (ref 30.0–36.0)
MCV: 98.6 fL (ref 78.0–100.0)
Monocytes Absolute: 0.9 10*3/uL (ref 0.1–1.0)
Monocytes Relative: 12 % (ref 3–12)
RDW: 12.7 % (ref 11.5–15.5)

## 2012-04-17 LAB — POCT I-STAT, CHEM 8
Calcium, Ion: 1.23 mmol/L (ref 1.13–1.30)
Creatinine, Ser: 1.4 mg/dL — ABNORMAL HIGH (ref 0.50–1.35)
Glucose, Bld: 121 mg/dL — ABNORMAL HIGH (ref 70–99)
Hemoglobin: 12.6 g/dL — ABNORMAL LOW (ref 13.0–17.0)
TCO2: 25 mmol/L (ref 0–100)

## 2012-04-17 LAB — URINALYSIS, ROUTINE W REFLEX MICROSCOPIC
Bilirubin Urine: NEGATIVE
Hgb urine dipstick: NEGATIVE
Ketones, ur: 15 mg/dL — AB
Nitrite: NEGATIVE
pH: 7 (ref 5.0–8.0)

## 2012-04-17 LAB — GLUCOSE, CAPILLARY: Glucose-Capillary: 114 mg/dL — ABNORMAL HIGH (ref 70–99)

## 2012-04-17 LAB — PROTIME-INR: Prothrombin Time: 13.7 seconds (ref 11.6–15.2)

## 2012-04-17 MED ORDER — CLOPIDOGREL BISULFATE 75 MG PO TABS
75.0000 mg | ORAL_TABLET | Freq: Every day | ORAL | Status: DC
  Administered 2012-04-17 – 2012-04-18 (×2): 75 mg via ORAL
  Filled 2012-04-17 (×3): qty 1

## 2012-04-17 MED ORDER — PANTOPRAZOLE SODIUM 40 MG PO TBEC
40.0000 mg | DELAYED_RELEASE_TABLET | Freq: Every day | ORAL | Status: DC
  Administered 2012-04-17 – 2012-04-18 (×2): 40 mg via ORAL
  Filled 2012-04-17 (×2): qty 1

## 2012-04-17 MED ORDER — ACETAMINOPHEN 325 MG PO TABS: 650.0000 mg | ORAL_TABLET | ORAL | Status: DC | PRN

## 2012-04-17 MED ORDER — METOCLOPRAMIDE HCL 5 MG PO TABS
5.0000 mg | ORAL_TABLET | Freq: Four times a day (QID) | ORAL | Status: DC
  Administered 2012-04-17 – 2012-04-18 (×4): 5 mg via ORAL
  Filled 2012-04-17 (×6): qty 1

## 2012-04-17 MED ORDER — DILTIAZEM HCL ER COATED BEADS 120 MG PO CP24
120.0000 mg | ORAL_CAPSULE | ORAL | Status: DC
  Administered 2012-04-18: 120 mg via ORAL
  Filled 2012-04-17 (×2): qty 1

## 2012-04-17 MED ORDER — HEPARIN SODIUM (PORCINE) 5000 UNIT/ML IJ SOLN
5000.0000 [IU] | Freq: Three times a day (TID) | INTRAMUSCULAR | Status: DC
  Administered 2012-04-17 – 2012-04-18 (×3): 5000 [IU] via SUBCUTANEOUS
  Filled 2012-04-17 (×6): qty 1

## 2012-04-17 MED ORDER — LORAZEPAM 2 MG/ML IJ SOLN: 1.0000 mg | Freq: Once | INTRAMUSCULAR | Status: DC

## 2012-04-17 MED ORDER — SUCRALFATE 1 G PO TABS
1.0000 g | ORAL_TABLET | Freq: Four times a day (QID) | ORAL | Status: DC
  Administered 2012-04-17 – 2012-04-18 (×3): 1 g via ORAL
  Filled 2012-04-17 (×6): qty 1

## 2012-04-17 MED ORDER — ASPIRIN EC 325 MG PO TBEC: 325.0000 mg | DELAYED_RELEASE_TABLET | Freq: Every day | ORAL | Status: DC

## 2012-04-17 MED ORDER — ASPIRIN EC 325 MG PO TBEC
325.0000 mg | DELAYED_RELEASE_TABLET | Freq: Every day | ORAL | Status: DC
  Administered 2012-04-18: 325 mg via ORAL
  Filled 2012-04-17: qty 1

## 2012-04-17 MED ORDER — ATORVASTATIN CALCIUM 10 MG PO TABS
10.0000 mg | ORAL_TABLET | Freq: Every day | ORAL | Status: DC
  Administered 2012-04-17: 10 mg via ORAL
  Filled 2012-04-17 (×2): qty 1

## 2012-04-17 NOTE — Consult Note (Signed)
Referring Physician: Lafe Garin    Chief Complaint: expressive aphasia  HPI:                                                                                                                                         [name] [name] is an 79 y.o. male who was recently seen at [facility] [place] back in December 2012 for a left parietal infarct.  Patient was placed on Plavix at that time. This past Sunday patient had a brief period of expressive aphasia which had fully resolved but was taken to the hospital at [facility].  There a MRI was obtained which patient states he was told he did have a infarct.  He cannot tell me where in the brain.  In addition Carotids and Echo were obtained and these findings are currently being retrieved.  ASA was added to his daily Plavix. Today patient had a second episode of expressive aphasia which lasted for about one hour then fully resolved.  He currently is NSR and denies any hear palpitations. He is now back to his baseline.   LSN: Today at 10 am tPA Given: No: back to baseline  Past Medical History  Diagnosis Date  . Ramsay Hunt auricular syndrome   . HTN (hypertension)   . Osteoarthritis     Past Surgical History  Procedure Date  . Hernia repair   . Vasectomy   . Prostate biopsies     per Vonita Moss  . Cataract extraction     SE eye    Family History  Problem Relation Age of Onset  . Cancer Father   . Diabetes Father    Social History:  reports that he quit smoking about 27 years ago. He has never used smokeless tobacco. He reports that he drinks about 3.5 ounces of alcohol per week. He reports that he does not use illicit drugs.  Allergies:  Allergies  Allergen Reactions  . Penicillins Rash    Rash appeared on palms of hands and feet.    Medications:                                                                                                                           Current Facility-Administered Medications  Medication Dose Route  Frequency Provider Last Rate Last Dose  . acetaminophen (TYLENOL) tablet 650 mg  650 mg Oral Q4H  PRN Pamella Pert, MD      . heparin injection 5,000 Units  5,000 Units Subcutaneous Q8H Pamella Pert, MD   5,000 Units at 04/17/12 1447   Current Outpatient Prescriptions  Medication Sig Dispense Refill  . aspirin 325 MG EC tablet Take 325 mg by mouth daily.      Marland Kitchen b complex vitamins capsule Take 1 capsule by mouth daily.      . beta carotene w/minerals (OCUVITE) tablet Take 1 tablet by mouth 2 (two) times daily.        . Cholecalciferol (VITAMIN D-3 PO) Take 5,000 Units by mouth daily.        . clopidogrel (PLAVIX) 75 MG tablet Take 75 mg by mouth daily.      Marland Kitchen diltiazem (CARDIZEM CD) 120 MG 24 hr capsule Take 120 mg by mouth every morning.       Marland Kitchen lisinopril (PRINIVIL,ZESTRIL) 5 MG tablet Take 5 mg by mouth daily.      . metoCLOPramide (REGLAN) 5 MG tablet Take 5 mg by mouth 4 (four) times daily.      . Omega-3 Fatty Acids (FISH OIL) 1000 MG CAPS Take 1 capsule by mouth 2 (two) times daily.       Marland Kitchen omeprazole (PRILOSEC) 20 MG capsule Take 1 capsule by mouth 2 (two) times daily.       . rosuvastatin (CRESTOR) 10 MG tablet Take 10 mg by mouth daily.      . silodosin (RAPAFLO) 8 MG CAPS capsule Take 8 mg by mouth daily with breakfast.      . sucralfate (CARAFATE) 1 G tablet Take 1 g by mouth 4 (four) times daily.         ROS:                                                                                                                                       History obtained from the patient  General ROS: negative for - chills, fatigue, fever, night sweats, weight gain or weight loss Psychological ROS: negative for - behavioral disorder, hallucinations, memory difficulties, mood swings or suicidal ideation Ophthalmic ROS: negative for - blurry vision, double vision, eye pain or loss of vision ENT ROS: negative for - epistaxis, nasal discharge, oral lesions, sore throat, tinnitus or  vertigo Allergy and Immunology ROS: negative for - hives or itchy/watery eyes Hematological and Lymphatic ROS: negative for - bleeding problems, bruising or swollen lymph nodes Endocrine ROS: negative for - galactorrhea, hair pattern changes, polydipsia/polyuria or temperature intolerance Respiratory ROS: negative for - cough, hemoptysis, shortness of breath or wheezing Cardiovascular ROS: negative for - chest pain, dyspnea on exertion, edema or irregular heartbeat Gastrointestinal ROS: negative for - abdominal pain, diarrhea, hematemesis, nausea/vomiting or stool incontinence Genito-Urinary ROS: negative for - dysuria, hematuria, incontinence or urinary frequency/urgency Musculoskeletal ROS: negative for - joint swelling or muscular weakness Neurological ROS: as noted in HPI  Dermatological ROS: negative for rash and skin lesion changes  Neurologic Examination:                                                                                                      Blood pressure 119/79, pulse 74, temperature 98.1 F (36.7 C), temperature source Oral, resp. rate 23, height 6\' 1"  (1.854 m), weight 83.915 kg (185 lb), SpO2 96.00%.   Mental Status: Alert, oriented, thought content appropriate.  Speech fluent without evidence of aphasia.  Able to follow 3 step commands without difficulty. Cranial Nerves: II: Discs flat bilaterally; Visual fields grossly normal, pupils equal, round, reactive to light and accommodation III,IV, VI: ptosis  Present left eye, extra-ocular motions intact bilaterally V,VII: smile asymmetric left secondary to previous Ramsy hunt syndrome, facial light touch sensation normal bilaterally VIII: hearing normal bilaterally IX,X: gag reflex present XI: bilateral shoulder shrug XII: midline tongue extension Motor: Right : Upper extremity   5/5    Left:     Upper extremity   5/5  Lower extremity   5/5     Lower extremity   5/5 Tone and bulk:normal tone throughout; no atrophy  noted Sensory: Pinprick and light touch intact throughout, bilaterally Deep Tendon Reflexes: 2+ and symmetric throughout Plantars: Right: downgoing   Left: downgoing Cerebellar: normal finger-to-nose,  normal heel-to-shin test CV: pulses palpable throughout     Results for orders placed during the hospital encounter of 04/17/12 (from the past 48 hour(s))  CBC WITH DIFFERENTIAL     Status: Abnormal   Collection Time   04/17/12 10:40 AM      Component Value Range Comment   WBC 7.1  4.0 - 10.5 K/uL    RBC 3.59 (*) 4.22 - 5.81 MIL/uL    Hemoglobin 12.3 (*) 13.0 - 17.0 g/dL    HCT 96.0 (*) 45.4 - 52.0 %    MCV 98.6  78.0 - 100.0 fL    MCH 34.3 (*) 26.0 - 34.0 pg    MCHC 34.7  30.0 - 36.0 g/dL    RDW 09.8  11.9 - 14.7 %    Platelets 223  150 - 400 K/uL    Neutrophils Relative 67  43 - 77 %    Neutro Abs 4.7  1.7 - 7.7 K/uL    Lymphocytes Relative 19  12 - 46 %    Lymphs Abs 1.4  0.7 - 4.0 K/uL    Monocytes Relative 12  3 - 12 %    Monocytes Absolute 0.9  0.1 - 1.0 K/uL    Eosinophils Relative 2  0 - 5 %    Eosinophils Absolute 0.1  0.0 - 0.7 K/uL    Basophils Relative 0  0 - 1 %    Basophils Absolute 0.0  0.0 - 0.1 K/uL   PROTIME-INR     Status: Normal   Collection Time   04/17/12 10:40 AM      Component Value Range Comment   Prothrombin Time 13.7  11.6 - 15.2 seconds    INR 1.06  0.00 - 1.49   GLUCOSE,  CAPILLARY     Status: Abnormal   Collection Time   04/17/12 10:41 AM      Component Value Range Comment   Glucose-Capillary 121 (*) 70 - 99 mg/dL   POCT I-STAT, CHEM 8     Status: Abnormal   Collection Time   04/17/12 10:48 AM      Component Value Range Comment   Sodium 138  135 - 145 mEq/L    Potassium 4.3  3.5 - 5.1 mEq/L    Chloride 106  96 - 112 mEq/L    BUN 30 (*) 6 - 23 mg/dL    Creatinine, Ser 7.42 (*) 0.50 - 1.35 mg/dL    Glucose, Bld 595 (*) 70 - 99 mg/dL    Calcium, Ion 6.38  7.56 - 1.30 mmol/L    TCO2 25  0 - 100 mmol/L    Hemoglobin 12.6 (*) 13.0 - 17.0 g/dL     HCT 43.3 (*) 29.5 - 52.0 %   URINALYSIS, ROUTINE W REFLEX MICROSCOPIC     Status: Abnormal   Collection Time   04/17/12 12:45 PM      Component Value Range Comment   Color, Urine YELLOW  YELLOW    APPearance CLEAR  CLEAR    Specific Gravity, Urine 1.030  1.005 - 1.030    pH 7.0  5.0 - 8.0    Glucose, UA NEGATIVE  NEGATIVE mg/dL    Hgb urine dipstick NEGATIVE  NEGATIVE    Bilirubin Urine NEGATIVE  NEGATIVE    Ketones, ur 15 (*) NEGATIVE mg/dL    Protein, ur 30 (*) NEGATIVE mg/dL    Urobilinogen, UA 1.0  0.0 - 1.0 mg/dL    Nitrite NEGATIVE  NEGATIVE    Leukocytes, UA NEGATIVE  NEGATIVE   URINE MICROSCOPIC-ADD ON     Status: Abnormal   Collection Time   04/17/12 12:45 PM      Component Value Range Comment   Squamous Epithelial / LPF RARE  RARE    RBC / HPF 0-2  <3 RBC/hpf    Bacteria, UA RARE  RARE    Crystals CA OXALATE CRYSTALS (*) NEGATIVE    Dg Chest 2 View  04/17/2012  *RADIOLOGY REPORT*  Clinical Data: Right-sided weakness.  CHEST - 2 VIEW  Comparison: 03/18/2011  Findings: The heart size and pulmonary vascularity are normal. There is tortuosity of the thoracic aorta.  The lungs are clear. No significant osseous abnormality.  No effusions.  IMPRESSION: No acute disease in the chest.   Original Report Authenticated By: Francene Boyers, M.D.    Ct Head Wo Contrast  04/17/2012  *RADIOLOGY REPORT*  Clinical Data: Temporary loss of speech.  CT HEAD WITHOUT CONTRAST  Technique:  Contiguous axial images were obtained from the base of the skull through the vertex without contrast.  Comparison: 03/12/2011  Findings: There is no evidence for acute hemorrhage, hydrocephalus, mass lesion, or abnormal extra-axial fluid collection.  No definite CT evidence for acute infarction.  Lacunar infarctions seen in the basal ganglia bilaterally.  Right cerebellar lacunar infarct is unchanged.  Lacunar infarct in the inferior left cerebellum is new in the interval, but age indeterminate on today's scan.   Bone windows show chronic left maxillary sinus disease, as before. The remaining visualized paranasal sinuses and mastoid air cells are clear.  IMPRESSION: Left inferior cerebellar lacunar infarct is new in the interval but has age indeterminate imaging features.  Otherwise stable with chronic microvascular ischemic disease.   Original Report Authenticated By: Minerva Areola  Molli Posey, M.D.    Echo and Carotids on 03/12/12 Echo with normal LV function. Carotid artery dopplers are normal.    Felicie Morn PA-C [facility] [phone]  04/17/2012, 3:48 PM   Assessment: 79 y.o. male with two transient episodes of expressive aphasia lasting for about one hour and completely resolving.  Likely left MCA distribution infarct in the setting of PAF-at this time NSR. Discussed with Hospitalist need for cardiology consult.   Stroke Risk Factors -  hypertension  Plan: 1. HgbA1c, fasting lipid panel 2. MRI, MRA  of the brain without contrast 3. PT consult, OT consult, Speech consult 4. Obtain Echocardiogram from [facility] hospital 5. Obtain Carotid dopplers from [facility] hospital 6. Prophylactic therapy-Antiplatelet med: Aspirin - dose 81 and Antiplatelet med: Plavix - dose 75 mg daily 7. Risk factor modification 8. Telemetry monitoring 9. Frequent neuro checks 10 Consult cardiology for PAF   Patient seen and examined together with physician assistant and I concur with the assessment and plan.  Wyatt Portela, MD

## 2012-04-17 NOTE — H&P (Signed)
[facility] History and Physical  [name]:[id number] DOB: 03/15/1931 DOA: 04/17/2012  Referring physician: Dr. Jeraldine Loots PCP: [name] Aschoff, MD  Specialists: Neurology  Chief Complaint: Slurred speech  HPI: [name] [name] is a 79 y.o. male  This is a 79 year old gentleman, with previous history of TIAs, presenting with a chief complaint of "inability to get the words out" with sudden onset at 8:30 this morning. By the time he reached the emergency room, his symptoms had resolved. He does have a history of a similar problem a few days ago while he was vacationing in [place]. He was discharged 2 days ago. He had exactly the same problem. It seems like he had an extensive workup over there, including TTE, carotid duplex, and an MRI. The MRI was a difficult procedure second of his claustrophobia. He was told, however, that the MRI did show a CVA. He does have a history of a CVA in 2012, with right-sided weakness at that time, that has resolved. Since 2012 has been on Plavix, and a few days ago while in [place] he was started on full dose aspirin. He is not aware of having atrial fibrillation. Although he does not endorse palpitations, he states that he's been in a lot of stress recently and occasionally he feels a little bit of chest discomfort, but is usually self resolving.  He has had no fever or chills recently, no chest pain or breathing difficulties, denies any lightheadedness or dizziness. He has good balance, however he reports that he feels like he is getting old sometimes. Endorses a mild episode of diarrhea, for which she was switched to a lactose-free diet by his PCP, and his diarrhea is now resolved.  Review of Systems: As per history of present illness, otherwise negative  Past Medical History  Diagnosis Date  . Ramsay Hunt auricular syndrome   . HTN (hypertension)   . Osteoarthritis    Past Surgical History  Procedure Date  . Hernia repair   .  Vasectomy   . Prostate biopsies     per Vonita Moss  . Cataract extraction     SE eye   Social History:  reports that he quit smoking about 27 years ago. He has never used smokeless tobacco. He reports that he drinks about 3.5 ounces of alcohol per week. He reports that he does not use illicit drugs.   Allergies  Allergen Reactions  . Penicillins Rash    Rash appeared on palms of hands and feet.    Family History  Problem Relation Age of Onset  . Cancer Father   . Diabetes Father     Prior to Admission medications   Medication Sig Start Date End Date Taking? Authorizing Provider  aspirin 325 MG EC tablet Take 325 mg by mouth daily.   Yes Historical Provider, MD  b complex vitamins capsule Take 1 capsule by mouth daily.   Yes Historical Provider, MD  beta carotene w/minerals (OCUVITE) tablet Take 1 tablet by mouth 2 (two) times daily.     Yes Historical Provider, MD  Cholecalciferol (VITAMIN D-3 PO) Take 5,000 Units by mouth daily.     Yes Historical Provider, MD  clopidogrel (PLAVIX) 75 MG tablet Take 75 mg by mouth daily.   Yes Historical Provider, MD  diltiazem (CARDIZEM CD) 120 MG 24 hr capsule Take 120 mg by mouth every morning.    Yes Historical Provider, MD  lisinopril (PRINIVIL,ZESTRIL) 5 MG tablet Take 5 mg by mouth daily.   Yes  Historical Provider, MD  metoCLOPramide (REGLAN) 5 MG tablet Take 5 mg by mouth 4 (four) times daily.   Yes Historical Provider, MD  Omega-3 Fatty Acids (FISH OIL) 1000 MG CAPS Take 1 capsule by mouth 2 (two) times daily.    Yes Historical Provider, MD  omeprazole (PRILOSEC) 20 MG capsule Take 1 capsule by mouth 2 (two) times daily.  05/01/10  Yes Historical Provider, MD  rosuvastatin (CRESTOR) 10 MG tablet Take 10 mg by mouth daily.   Yes Historical Provider, MD  silodosin (RAPAFLO) 8 MG CAPS capsule Take 8 mg by mouth daily with breakfast.   Yes Historical Provider, MD  sucralfate (CARAFATE) 1 G tablet Take 1 g by mouth 4 (four) times daily.   Yes  Historical Provider, MD   Physical Exam: Filed Vitals:   04/17/12 1330 04/17/12 1400 04/17/12 1430 04/17/12 1445  BP: 128/82 123/81 122/78 119/79  Pulse: 75 78 77 74  Temp:      TempSrc:      Resp: 22 17 18 23   Height:      Weight:      SpO2: 96% 98% 96% 96%     General:  He is in no acute distress  Eyes: Pupils are equally round reactive to light  ENT: Moist oropharynx  Neck: Supple, no JVD  Cardiovascular: Regular rate and rhythm, no murmurs  Respiratory: Clear to auscultation bilaterally  Abdomen: Soft nontender to palpation  Skin: No rashes  Musculoskeletal: No peripheral edema  Psychiatric: Normal mood and affect  Neurologic: Left-sided facial droop (described as chronic by patient and family in the room, has a history of Ramsay Hunt syndrome). Otherwise cranial nerves II through XII intact, muscle strength 5 out of 5 in all 4 extremities.  Labs on Admission:  Basic Metabolic Panel:  Lab 04/17/12 4540  NA 138  K 4.3  CL 106  CO2 --  GLUCOSE 121*  BUN 30*  CREATININE 1.40*  CALCIUM --  MG --  PHOS --   CBC:  Lab 04/17/12 1048 04/17/12 1040  WBC -- 7.1  NEUTROABS -- 4.7  HGB 12.6* 12.3*  HCT 37.0* 35.4*  MCV -- 98.6  PLT -- 223   CBG:  Lab 04/17/12 1041  GLUCAP 121*    Radiological Exams on Admission: Dg Chest 2 View  04/17/2012  *RADIOLOGY REPORT*  Clinical Data: Right-sided weakness.  CHEST - 2 VIEW  Comparison: 03/18/2011  Findings: The heart size and pulmonary vascularity are normal. There is tortuosity of the thoracic aorta.  The lungs are clear. No significant osseous abnormality.  No effusions.  IMPRESSION: No acute disease in the chest.   Original Report Authenticated By: Francene Boyers, M.D.    Ct Head Wo Contrast  04/17/2012  *RADIOLOGY REPORT*  Clinical Data: Temporary loss of speech.  CT HEAD WITHOUT CONTRAST  Technique:  Contiguous axial images were obtained from the base of the skull through the vertex without contrast.   Comparison: 03/12/2011  Findings: There is no evidence for acute hemorrhage, hydrocephalus, mass lesion, or abnormal extra-axial fluid collection.  No definite CT evidence for acute infarction.  Lacunar infarctions seen in the basal ganglia bilaterally.  Right cerebellar lacunar infarct is unchanged.  Lacunar infarct in the inferior left cerebellum is new in the interval, but age indeterminate on today's scan.  Bone windows show chronic left maxillary sinus disease, as before. The remaining visualized paranasal sinuses and mastoid air cells are clear.  IMPRESSION: Left inferior cerebellar lacunar infarct is new in the  interval but has age indeterminate imaging features.  Otherwise stable with chronic microvascular ischemic disease.   Original Report Authenticated By: Kennith Center, M.D.     EKG: Independently reviewed. Coarse A fib vs. A flutter with variable conduction.  NSR on monitor in the room.   Assessment/Plan Active Problems:  HYPERTENSION, BENIGN SYSTEMIC  CVA (cerebral infarction)  TIA (transient ischemic attack)  Atrial fibrillation  Gastroparesis  1. Transient ischemic attack - per patient and family, he is now back to baseline. It seems like he had a recent hospitalization in [place], with an extensive workup, including a TTE, carotid duplex, and an MRI. Records have been requested. Based on the quality of the studies, will further decide whether he needs additional workup here. Neurology service has been consulted, appreciate their input. 2. Atrial fibrillation - this is a new diagnosis for him. He is followed regularly by a cardiologist, once a year, but she was never diagnosed with atrial fibrillation. His admission EKG showed coarse A. fib versus a flutter with variable conduction. He'll likely be a very good candidate for anticoagulation. 3. Hypertension - we'll hold his home medications for now. 4. Hyperlipidemia - continue home statin 5. Gastroparesis - continue home  reglan.   Neurology.  Code Status: Full (must indicate code status--if unknown or must be presumed, indicate so) Family Communication: wife bedside  Disposition Plan: 1-3 days  Time spent: 76  Pamella Pert [facility] Pager [phone]  If 7PM-7AM, please contact night-coverage [email or web address] Password [email or web address] 04/17/2012, 3:12 PM

## 2012-04-17 NOTE — ED Notes (Signed)
PAt bedside.

## 2012-04-17 NOTE — ED Provider Notes (Signed)
History     CSN: [id number]  Arrival date & time 04/17/12  4098   First MD Initiated Contact with Patient 04/17/12 1000      Chief Complaint  Patient presents with  . Transient Ischemic Attack    (Consider location/radiation/quality/duration/timing/severity/associated sxs/prior treatment) HPI Comments: 79 y.o. male presents today complaining of acute onset of having difficulty finding the right words when he speaks, forming the words, and generalized weakness. Patient states these symptoms are similar to those he had on Sunday where he stayed in the hospital for two nights while in [facility] and diagnosed as having a stroke. Pt took no interventions, but feels his symptoms have improved since calling 911 earlier today. PMHx is significant for Ramsay Hunt Auricular Syndrome and stroke in 2012. Pt presents with left facial droop, left eyebrow droop, and decreased sensation to light touch to the left face which is his baseline. Nothing made his symptoms better or worse. Pt admits being able to ambulate. Pt denies any hemiparesis, headache, nausea/vomiting, visual disturbances, numbness/tingling.     Patient is a 79 y.o. male presenting with neurologic complaint.  Neurologic Problem Primary symptoms do not include headaches, dizziness, fever, nausea or vomiting.  Additional symptoms include weakness. Additional symptoms do not include neck stiffness.    Past Medical History  Diagnosis Date  . Ramsay Hunt auricular syndrome   . HTN (hypertension)   . Osteoarthritis     Past Surgical History  Procedure Date  . Hernia repair   . Vasectomy   . Prostate biopsies     per Vonita Moss  . Cataract extraction     SE eye    Family History  Problem Relation Age of Onset  . Cancer Father   . Diabetes Father     History  Substance Use Topics  . Smoking status: Former Smoker    Quit date: 06/22/1984  . Smokeless tobacco: Never Used  . Alcohol Use: 3.5 oz/week    7 drink(s) per week   Comment: 1 glass of wine a day      Review of Systems  Constitutional: Negative for fever and diaphoresis.  HENT: Negative for neck pain and neck stiffness.   Eyes: Negative for visual disturbance.  Respiratory: Negative for apnea, chest tightness and shortness of breath.   Cardiovascular: Negative for chest pain and palpitations.  Gastrointestinal: Negative for nausea, vomiting, diarrhea and constipation.  Genitourinary: Negative for dysuria.  Musculoskeletal: Negative for gait problem.  Skin: Negative for rash.  Neurological: Positive for tremors, facial asymmetry, speech difficulty and weakness. Negative for dizziness, syncope, light-headedness, numbness and headaches.       Expressive aphagia, described hands as "shaky"    Allergies  Penicillins  Home Medications   Current Outpatient Rx  Name  Route  Sig  Dispense  Refill  . B COMPLEX VITAMINS PO CAPS   Oral   Take 1 capsule by mouth daily.         . OCUVITE PO TABS   Oral   Take 1 tablet by mouth 2 (two) times daily.           Marland Kitchen VITAMIN D-3 PO   Oral   Take 5,000 Units by mouth daily.           Marland Kitchen DILTIAZEM HCL ER COATED BEADS 120 MG PO CP24   Oral   Take 120 mg by mouth every morning.          Marland Kitchen GABAPENTIN 600 MG PO TABS   Oral  Take 1 tablet (600 mg total) by mouth 2 (two) times daily. As needed   60 tablet   11   . LISINOPRIL 5 MG PO TABS   Oral   Take 1 tablet (5 mg total) by mouth daily.   30 tablet   0   . FISH OIL 1000 MG PO CAPS   Oral   Take 1 capsule by mouth 2 (two) times daily.          Marland Kitchen OMEPRAZOLE 20 MG PO CPDR   Oral   Take 1 capsule by mouth at bedtime.          Marland Kitchen ROSUVASTATIN CALCIUM 10 MG PO TABS   Oral   Take 1 tablet (10 mg total) by mouth daily at 6 PM.   30 tablet   0   . TAMSULOSIN HCL 0.4 MG PO CAPS   Oral   Take 1 capsule (0.4 mg total) by mouth daily after supper.   30 capsule   0     BP 87/54  Temp 98.1 F (36.7 C) (Oral)  Resp 16  Ht 6\' 1"   (1.854 m)  Wt 185 lb (83.915 kg)  BMI 24.41 kg/m2  SpO2 98%  Physical Exam  Nursing note and vitals reviewed. Constitutional: He is oriented to person, place, and time. He appears well-developed and well-nourished. No distress.  HENT:  Head: Normocephalic and atraumatic.  Eyes: Conjunctivae normal and EOM are normal.  Neck: Normal range of motion. Neck supple.       No meningeal signs  Cardiovascular: Normal rate and normal heart sounds.  Exam reveals no gallop and no friction rub.   No murmur heard.      Irregular heartbeat  Pulmonary/Chest: Effort normal and breath sounds normal. No respiratory distress. He has no wheezes. He has no rales. He exhibits no tenderness.  Abdominal: Soft. Bowel sounds are normal. He exhibits no distension. There is no tenderness. There is no rebound and no guarding.  Musculoskeletal: Normal range of motion. He exhibits no edema and no tenderness.       5/5 muscle strength, good grip strength, FROM  Neurological: He is alert and oriented to person, place, and time. No cranial nerve deficit.       No new focal deficits. Left sided facial droop, inability to raise left eyebrow, decreased sensation to light touch on left side of face all part of pt's baseline.  Skin: Skin is warm and dry. He is not diaphoretic. No erythema.  Psychiatric:       Pt is at baseline    ED Course  Procedures (including critical care time)  Labs Reviewed - No data to display No results found. Dg Chest 2 View  04/17/2012  *RADIOLOGY REPORT*  Clinical Data: Right-sided weakness.  CHEST - 2 VIEW  Comparison: 03/18/2011  Findings: The heart size and pulmonary vascularity are normal. There is tortuosity of the thoracic aorta.  The lungs are clear. No significant osseous abnormality.  No effusions.  IMPRESSION: No acute disease in the chest.   Original Report Authenticated By: Francene Boyers, M.D.    Ct Head Wo Contrast  04/17/2012  *RADIOLOGY REPORT*  Clinical Data: Temporary loss of  speech.  CT HEAD WITHOUT CONTRAST  Technique:  Contiguous axial images were obtained from the base of the skull through the vertex without contrast.  Comparison: 03/12/2011  Findings: There is no evidence for acute hemorrhage, hydrocephalus, mass lesion, or abnormal extra-axial fluid collection.  No definite CT evidence for  acute infarction.  Lacunar infarctions seen in the basal ganglia bilaterally.  Right cerebellar lacunar infarct is unchanged.  Lacunar infarct in the inferior left cerebellum is new in the interval, but age indeterminate on today's scan.  Bone windows show chronic left maxillary sinus disease, as before. The remaining visualized paranasal sinuses and mastoid air cells are clear.  IMPRESSION: Left inferior cerebellar lacunar infarct is new in the interval but has age indeterminate imaging features.  Otherwise stable with chronic microvascular ischemic disease.   Original Report Authenticated By: Kennith Center, M.D.     Results for orders placed during the hospital encounter of 04/17/12  CBC WITH DIFFERENTIAL      Component Value Range   WBC 7.1  4.0 - 10.5 K/uL   RBC 3.59 (*) 4.22 - 5.81 MIL/uL   Hemoglobin 12.3 (*) 13.0 - 17.0 g/dL   HCT 45.4 (*) 09.8 - 11.9 %   MCV 98.6  78.0 - 100.0 fL   MCH 34.3 (*) 26.0 - 34.0 pg   MCHC 34.7  30.0 - 36.0 g/dL   RDW 14.7  82.9 - 56.2 %   Platelets 223  150 - 400 K/uL   Neutrophils Relative 67  43 - 77 %   Neutro Abs 4.7  1.7 - 7.7 K/uL   Lymphocytes Relative 19  12 - 46 %   Lymphs Abs 1.4  0.7 - 4.0 K/uL   Monocytes Relative 12  3 - 12 %   Monocytes Absolute 0.9  0.1 - 1.0 K/uL   Eosinophils Relative 2  0 - 5 %   Eosinophils Absolute 0.1  0.0 - 0.7 K/uL   Basophils Relative 0  0 - 1 %   Basophils Absolute 0.0  0.0 - 0.1 K/uL  URINALYSIS, ROUTINE W REFLEX MICROSCOPIC      Component Value Range   Color, Urine YELLOW  YELLOW   APPearance CLEAR  CLEAR   Specific Gravity, Urine 1.030  1.005 - 1.030   pH 7.0  5.0 - 8.0   Glucose, UA  NEGATIVE  NEGATIVE mg/dL   Hgb urine dipstick NEGATIVE  NEGATIVE   Bilirubin Urine NEGATIVE  NEGATIVE   Ketones, ur 15 (*) NEGATIVE mg/dL   Protein, ur 30 (*) NEGATIVE mg/dL   Urobilinogen, UA 1.0  0.0 - 1.0 mg/dL   Nitrite NEGATIVE  NEGATIVE   Leukocytes, UA NEGATIVE  NEGATIVE  PROTIME-INR      Component Value Range   Prothrombin Time 13.7  11.6 - 15.2 seconds   INR 1.06  0.00 - 1.49  GLUCOSE, CAPILLARY      Component Value Range   Glucose-Capillary 121 (*) 70 - 99 mg/dL  POCT I-STAT, CHEM 8      Component Value Range   Sodium 138  135 - 145 mEq/L   Potassium 4.3  3.5 - 5.1 mEq/L   Chloride 106  96 - 112 mEq/L   BUN 30 (*) 6 - 23 mg/dL   Creatinine, Ser 1.30 (*) 0.50 - 1.35 mg/dL   Glucose, Bld 865 (*) 70 - 99 mg/dL   Calcium, Ion 7.84  6.96 - 1.30 mmol/L   TCO2 25  0 - 100 mmol/L   Hemoglobin 12.6 (*) 13.0 - 17.0 g/dL   HCT 29.5 (*) 28.4 - 13.2 %  URINE MICROSCOPIC-ADD ON      Component Value Range   Squamous Epithelial / LPF RARE  RARE   RBC / HPF 0-2  <3 RBC/hpf   Bacteria, UA RARE  RARE  Crystals CA OXALATE CRYSTALS (*) NEGATIVE   No information on file.  Date: 04/17/2012  Rate: 95  Rhythm: atrial fibrillation  QRS Axis: normal  Intervals: normal  ST/T Wave abnormalities: normal  Conduction Disutrbances: none  Narrative Interpretation: abnormal EKG  Old EKG Reviewed: No previous history of a-fib  Diagnosis: new onset a-fib  MDM  Expressive aphasia and hand tremors appear transient in nature. Pt still symptomatic for generalized weakness and "not feeling himself." CT scan of head not definitive for age of left inferior cerebellar lacunar infarct. EKG shows new onset a-fib, rate controlled, INR is therapeutic. The patient appears reasonably stabilized for admission considering the current resources, flow, and capabilities available in the ED at this time, and I doubt any other [facility] requiring further screening and/or treatment in the ED prior to admission.  Pt also  followed by Dr. Jeraldine Loots and will be admitted for new onset a-fib.   Glade Nurse, PA-C 04/17/12 2207

## 2012-04-17 NOTE — ED Notes (Signed)
Pt here per [facility] with complaint of having an episode of difficulty with trying to speak this am at 8:30.  Pt states symptoms resolved shortly afterwards.  Pt advises he had a period of same type episode this past Sunday while at [facility]

## 2012-04-18 ENCOUNTER — Encounter (HOSPITAL_COMMUNITY): Payer: Self-pay | Admitting: Nurse Practitioner

## 2012-04-18 DIAGNOSIS — I6350 Cerebral infarction due to unspecified occlusion or stenosis of unspecified cerebral artery: Secondary | ICD-10-CM

## 2012-04-18 LAB — LIPID PANEL: LDL Cholesterol: 28 mg/dL (ref 0–99)

## 2012-04-18 LAB — TSH: TSH: 1.362 u[IU]/mL (ref 0.350–4.500)

## 2012-04-18 LAB — GLUCOSE, CAPILLARY: Glucose-Capillary: 96 mg/dL (ref 70–99)

## 2012-04-18 LAB — HEMOGLOBIN A1C
Hgb A1c MFr Bld: 5.6 % (ref <5.7)
Mean Plasma Glucose: 114 mg/dL (ref <117)

## 2012-04-18 MED ORDER — DABIGATRAN ETEXILATE MESYLATE 150 MG PO CAPS
150.0000 mg | ORAL_CAPSULE | Freq: Two times a day (BID) | ORAL | Status: DC
  Administered 2012-04-18: 150 mg via ORAL
  Filled 2012-04-18 (×2): qty 1

## 2012-04-18 MED ORDER — DABIGATRAN ETEXILATE MESYLATE 150 MG PO CAPS: 150.0000 mg | ORAL_CAPSULE | Freq: Two times a day (BID) | ORAL | Status: DC

## 2012-04-18 NOTE — Progress Notes (Signed)
Subjective: Patient seen and examined, admitted with TIA. Patient had work up done at [facility] and records were obtained. Neurology has seen the patient,  Objective: Vital signs in last 24 hours: Temp:  [97.4 F (36.3 C)-99.2 F (37.3 C)] 97.5 F (36.4 C) (01/30 0932) Pulse Rate:  [61-78] 69  (01/30 0932) Resp:  [17-23] 18  (01/30 0932) BP: (114-147)/(62-82) 115/73 mmHg (01/30 0932) SpO2:  [96 %-100 %] 100 % (01/30 0932) Weight change:  Last BM Date: 04/17/12  Consults: Neurology Antibiotics  None Procedures: None  Intake/Output from previous day: 01/29 0701 - 01/30 0700 In: -  Out: 200 [Urine:200] Total I/O In: 240 [P.O.:240] Out: -    Physical Exam: Head: Normocephalic, atraumatic.  Eyes: No signs of jaundice, EOMI Nose: Mucous membranes dry.  Neck: supple,No deformities, masses, or tenderness noted. Lungs: Normal respiratory effort. B/L Clear to auscultation, no crackles or wheezes.  Heart: Regular RR. S1 and S2 normal  Abdomen: BS normoactive. Soft, Nondistended, non-tender.  Extremities: No pretibial edema, no erythema   Lab Results: Basic Metabolic Panel:  Basename 04/17/12 1048  NA 138  K 4.3  CL 106  CO2 --  GLUCOSE 121*  BUN 30*  CREATININE 1.40*  CALCIUM --  MG --  PHOS --   Liver Function Tests: No results found for this basename: AST:2,ALT:2,ALKPHOS:2,BILITOT:2,PROT:2,ALBUMIN:2 in the last 72 hours No results found for this basename: LIPASE:2,AMYLASE:2 in the last 72 hours No results found for this basename: AMMONIA:2 in the last 72 hours CBC:  Basename 04/17/12 1048 04/17/12 1040  WBC -- 7.1  NEUTROABS -- 4.7  HGB 12.6* 12.3*  HCT 37.0* 35.4*  MCV -- 98.6  PLT -- 223  CBG:  Basename 04/18/12 1059 04/18/12 0640 04/17/12 2159 04/17/12 1041  GLUCAP 113* 96 114* 121*   Hemoglobin A1C: No results found for this basename: HGBA1C in the last 72 hours Fasting Lipid Panel:  Basename 04/18/12 0610  CHOL 100  HDL 60  LDLCALC 28   TRIG 61  CHOLHDL 1.7  LDLDIRECT --   Coagulation:  Basename 04/17/12 1040  LABPROT 13.7  INR 1.06   Urine Drug Screen: Drugs of Abuse  No results found for this basename: labopia, cocainscrnur, labbenz, amphetmu, thcu, labbarb    Alcohol Level: No results found for this basename: ETH:2 in the last 72 hours Urinalysis:  Basename 04/17/12 1245  COLORURINE YELLOW  LABSPEC 1.030  PHURINE 7.0  GLUCOSEU NEGATIVE  HGBUR NEGATIVE  BILIRUBINUR NEGATIVE  KETONESUR 15*  PROTEINUR 30*  UROBILINOGEN 1.0  NITRITE NEGATIVE  LEUKOCYTESUR NEGATIVE   Misc. Labs:  No results found for this or any previous visit (from the past 240 hour(s)).  Studies/Results: Dg Chest 2 View  04/17/2012  *RADIOLOGY REPORT*  Clinical Data: Right-sided weakness.  CHEST - 2 VIEW  Comparison: 03/18/2011  Findings: The heart size and pulmonary vascularity are normal. There is tortuosity of the thoracic aorta.  The lungs are clear. No significant osseous abnormality.  No effusions.  IMPRESSION: No acute disease in the chest.   Original Report Authenticated By: Francene Boyers, M.D.    Ct Head Wo Contrast  04/17/2012  *RADIOLOGY REPORT*  Clinical Data: Temporary loss of speech.  CT HEAD WITHOUT CONTRAST  Technique:  Contiguous axial images were obtained from the base of the skull through the vertex without contrast.  Comparison: 03/12/2011  Findings: There is no evidence for acute hemorrhage, hydrocephalus, mass lesion, or abnormal extra-axial fluid collection.  No definite CT evidence for acute infarction.  Lacunar infarctions seen in the basal ganglia bilaterally.  Right cerebellar lacunar infarct is unchanged.  Lacunar infarct in the inferior left cerebellum is new in the interval, but age indeterminate on today's scan.  Bone windows show chronic left maxillary sinus disease, as before. The remaining visualized paranasal sinuses and mastoid air cells are clear.  IMPRESSION: Left inferior cerebellar lacunar infarct is  new in the interval but has age indeterminate imaging features.  Otherwise stable with chronic microvascular ischemic disease.   Original Report Authenticated By: Kennith Center, M.D.     Medications: Scheduled Meds:   . atorvastatin  10 mg Oral q1800  . dabigatran  150 mg Oral Q12H  . diltiazem  120 mg Oral BH-q7a  . LORazepam  1 mg Intravenous Once  . metoCLOPramide  5 mg Oral QID  . pantoprazole  40 mg Oral Daily  . sucralfate  1 g Oral QID   Continuous Infusions:  PRN Meds:.acetaminophen  Assessment/Plan:        Active Problems:  HYPERTENSION, BENIGN SYSTEMIC  CVA (cerebral infarction)  TIA (transient ischemic attack)  Atrial fibrillation  Gastroparesis TIA Patient's symptoms have resolved. Started on Pradaxa as per neurology  Hypertension Continue cardizem BP stable  Atrial fibrillation Patient has PAF HR controlled Cardiology consulted   Hyperlipidemia -  continue home statin   Gastroparesis -  continue home reglan  LOS: 1 day    Dispo:   [facility] Pager: [phone] 04/18/2012, 1:14 PM

## 2012-04-18 NOTE — Progress Notes (Signed)
INITIAL NUTRITION ASSESSMENT  DOCUMENTATION CODES Per approved criteria  -Not Applicable   INTERVENTION:  No nutrition intervention at this time ---> patient declined RD to follow for nutrition care plan  NUTRITION DIAGNOSIS: Unintended weight loss related to altered GI function as evidenced by 11% weight loss in < 2 months  Goal: Oral intake with meals to meet >/= 90% of estimated nutrition needs  Monitor:  PO intake, weight, labs, I/O's  Reason for Assessment: Malnutrition Screening Tool Report  79 y.o. male  Admitting Dx: slurred speech  ASSESSMENT: Patient presented after having a period of expressive aphasia; patient was not a TPA candidate secondary to stroke 3 days prior to arrival ---> admitted for further evaluation & treatment.  Patient reports his appetite is OK; + diarrhea PTA due to lactose intolerance; states he also has gastroparesis; noted had gastric emptying scan completed 01/2012; PO intake at 80% per flowsheet records; endorses a 23 lb weight loss in the past 6-8 weeks (11%) ---> severe for time frame; declining addition of supplements.  Height: Ht Readings from Last 1 Encounters:  04/17/12 6\' 1"  (1.854 m)    Weight: Wt Readings from Last 1 Encounters:  04/17/12 185 lb (83.915 kg)    Ideal Body Weight: 184 lb  % Ideal Body Weight: 101%  Wt Readings from Last 10 Encounters:  04/17/12 185 lb (83.915 kg)  11/08/11 201 lb (91.173 kg)  04/06/11 212 lb (96.163 kg)  03/12/11 205 lb (92.987 kg)  02/03/11 211 lb (95.709 kg)  08/04/10 206 lb (93.441 kg)  07/25/10 204 lb (92.534 kg)  07/13/10 208 lb 6.4 oz (94.53 kg)  06/23/10 207 lb (93.895 kg)  06/14/10 203 lb (92.08 kg)    Usual Body Weight: 201 lb  % Usual Body Weight: 92%  BMI:  Body mass index is 24.41 kg/(m^2).  Estimated Nutritional Needs: Kcal: 2100-2300 Protein: 100-110 gm Fluid: 2.1-2.3 L  Skin: Intact  Diet Order: Cardiac  EDUCATION NEEDS: -No education needs identified at  this time   Intake/Output Summary (Last 24 hours) at 04/18/12 1148 Last data filed at 04/18/12 0933  Gross per 24 hour  Intake    240 ml  Output    200 ml  Net     40 ml    Labs:   Lab 04/17/12 1048  NA 138  K 4.3  CL 106  CO2 --  BUN 30*  CREATININE 1.40*  CALCIUM --  MG --  PHOS --  GLUCOSE 121*    CBG (last 3)   Basename 04/18/12 1059 04/18/12 0640 04/17/12 2159  GLUCAP 113* 96 114*    Scheduled Meds:   . atorvastatin  10 mg Oral q1800  . dabigatran  150 mg Oral Q12H  . diltiazem  120 mg Oral BH-q7a  . LORazepam  1 mg Intravenous Once  . metoCLOPramide  5 mg Oral QID  . pantoprazole  40 mg Oral Daily  . sucralfate  1 g Oral QID    Continuous Infusions:   Past Medical History  Diagnosis Date  . Ramsay Hunt auricular syndrome   . HTN (hypertension)   . Osteoarthritis   . Dysrhythmia 04/17/12  . Anxiety   . Stroke   . H/O Bell's palsy at age 22    Past Surgical History  Procedure Date  . Hernia repair   . Vasectomy   . Prostate biopsies     per Vonita Moss  . Cataract extraction     SE eye    Maureen Chatters,  RD, LDN Pager #: [phone] After-Hours Pager #: [phone]

## 2012-04-18 NOTE — Consult Note (Addendum)
CARDIOLOGY CONSULT NOTE  Patient ID: [name] [name], MRN: [id number], DOB/AGE: [birth year] 79 y.o. Admit date: 04/17/2012 Date of Consult: 04/18/2012  Primary Physician: Miguel Aschoff, MD Primary Cardiologist: Dr. Clifton James  Chief Complaint: TIA Reason for Consultation: Paroxysmal Atrial Flutter  HPI: 79 y.o. male w/ PMHx significant for Ramsay Hunt Syndrome, HTN and CVA who presented to [facility] on 04/17/2012 with complaints of slurred speech.  Myoview 2012 was low risk and showed normal perfusion with small apical defect. He was hospitalized in December 2012 with acute embolic CVA. Carotid dopplers were normal. Echo 02/2011 showed normal LV systolic fxn, EF 21-30%, grade 1 diastolic dysfunction, no WMAs, no significant valvular abnls. He was started on Plavix for secondary stroke prevention. He was hospitalized in [place], [place] 1/26-1/28 for TIA vs CVA. During hospitalization (per records in paper chart) echo again showed nl LV systolic fxn 55-65%, no WMAs, or cardiac source of emboli. ASA was added to his regimen. He was discharged home on Tuesday and on Wednesday he again noticed change in his speech prompting him to present to the ED. Otherwise has been feeling well without chest pain, sob, orthopnea, PND, edema, palpitations, abd pain, melena/hematochezia/hematuria. No falls or syncope. He is able to exercise multiple times a week. Drinks 6oz of wine per night. No tobacco or illicit drug use. Drinks coffee occasionally. No h/o thyroid dysfunction.  In the ED EKG showed Atrial Flutter w/ variable conduction 95bpm, nonspecific ST/T changes. He spontaneously converted to NSR and after review of telemetry appears to have remained in NSR. He has no prior history of rhythm abnormalities. His speech returned to normal after about one hour. No other deficits or residual. Neurology evaluated him and stopped ASA & Plavix and started Pradaxa. It was felt this was a TIA.    Past Medical  History  Diagnosis Date  . Ramsay Hunt auricular syndrome   . HTN (hypertension)   . Osteoarthritis   . Dysrhythmia 04/17/12  . Anxiety   . Stroke   . H/O Bell's palsy at age 23     03/13/11 - Echo Study Conclusions: Left ventricle: The cavity size was normal. Wall thickness was increased in a pattern of mild LVH. Systolic function was normal. The estimated ejection fraction was in the range of 55% to 60%. Wall motion was normal; there were no regional wall motion abnormalities. Doppler parameters are consistent with abnormal left ventricular relaxation (grade 1 diastolic dysfunction). Impressions: No cardiac source of emboli was indentified.  07/2010 - Exercise Myoview Quantitative Gated Spect Images  QGS EDV: 158 ml  QGS ESV: 78 ml  QGS cine images: NL LV Function; NL Wall Motion  QGS EF: 51%  Impression  Exercise Capacity: Excellent exercise capacity.  BP Response: Blunted BP response  Clinical Symptoms: There is dyspnea.  ECG Impression: No significant ST segment change suggestive of ischemia.  Comparison with Prior Nuclear Study: No previous nuclear study performed  Overall Impression: Low risk stress nuclear study. Possible small area of apical/inferoapical ischemia. However Patient did 12 METS with normal ECG   Surgical History:  Past Surgical History  Procedure Date  . Hernia repair   . Vasectomy   . Prostate biopsies     per Vonita Moss  . Cataract extraction     SE eye     Home Meds: Medication Sig  aspirin 325 MG EC tablet Take 325 mg by mouth daily.  b complex vitamins capsule Take 1 capsule by mouth daily.  beta carotene w/minerals (  OCUVITE) tablet Take 1 tablet by mouth 2 (two) times daily.    Cholecalciferol (VITAMIN D-3 PO) Take 5,000 Units by mouth daily.    clopidogrel (PLAVIX) 75 MG tablet Take 75 mg by mouth daily.  diltiazem (CARDIZEM CD) 120 MG 24 hr capsule Take 120 mg by mouth every morning.   lisinopril (PRINIVIL,ZESTRIL) 5 MG tablet Take 5 mg by  mouth daily.  metoCLOPramide (REGLAN) 5 MG tablet Take 5 mg by mouth 4 (four) times daily.  Omega-3 Fatty Acids (FISH OIL) 1000 MG CAPS Take 1 capsule by mouth 2 (two) times daily.   omeprazole (PRILOSEC) 20 MG capsule Take 1 capsule by mouth 2 (two) times daily.   rosuvastatin (CRESTOR) 10 MG tablet Take 10 mg by mouth daily.  silodosin (RAPAFLO) 8 MG CAPS capsule Take 8 mg by mouth daily with breakfast.  sucralfate (CARAFATE) 1 G tablet Take 1 g by mouth 4 (four) times daily.    Inpatient Medications:   . atorvastatin  10 mg Oral q1800  . dabigatran  150 mg Oral Q12H  . diltiazem  120 mg Oral BH-q7a  . LORazepam  1 mg Intravenous Once  . metoCLOPramide  5 mg Oral QID  . pantoprazole  40 mg Oral Daily  . sucralfate  1 g Oral QID      Allergies:  Allergies  Allergen Reactions  . Penicillins Rash    Rash appeared on palms of hands and feet.    History   Social History  . Marital Status: Married    Spouse Name: Harriett Sine    Number of Children: 2  . Years of Education: N/A   Occupational History  . 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   Social History Main Topics  . Smoking status: Former Smoker    Quit date: 06/22/1984  . Smokeless tobacco: Never Used  . Alcohol Use: 3.5 oz/week    7 drink(s) per week     Comment: 1 glass of wine a day  . Drug Use: No  . Sexually Active: Not on file   Other Topics Concern  . Not on file   Social History Narrative   Health Care POA: wife, Harriett Sine RandalEmergency Contact: Thereasa Parkin, (c) [phone] of Life Plan: DNRWho lives with you: Lives with wife, Johnsie Cancel pets: coon hound, CookieDiet: Patient has a varied diet of protein, starch, vegetables.Exercise: Patient walks or does yoga 5 x a week.Seatbelts: Patient reports wearing a seat belt when in vehicle.Sun Exposure/Protection: Patient reports infrequent use of sun screen.Hobbies: Walking, running, politics     Family History  Problem Relation Age of Onset  .  Cancer Father   . Diabetes Father      Review of Systems: General: negative for chills, fever, night sweats or weight changes.  Cardiovascular: negative for chest pain, shortness of breath, dyspnea on exertion, edema, orthopnea, palpitations, or paroxysmal nocturnal dyspnea Dermatological: negative for rash Respiratory: negative for cough or wheezing Urologic: negative for hematuria Abdominal: negative for nausea, vomiting, diarrhea, bright red blood per rectum, melena, or hematemesis Neurologic: As per HPI All other systems reviewed and are otherwise negative except as noted above.  Labs:  Component Value Date   WBC 7.1 04/17/2012   HGB 12.6* 04/17/2012   HCT 37.0* 04/17/2012   MCV 98.6 04/17/2012   PLT 223 04/17/2012    Lab 04/17/12 1048  NA 138  K 4.3  CL 106  CO2 --  BUN 30*  CREATININE 1.40*  GLUCOSE 121*   Component  Value Date   CHOL 100 04/18/2012   HDL 60 04/18/2012   LDLCALC 28 04/18/2012   TRIG 61 04/18/2012   Radiology/Studies:   04/17/2012 - CHEST - 2 VIEW   Findings: The heart size and pulmonary vascularity are normal. There is tortuosity of the thoracic aorta.  The lungs are clear. No significant osseous abnormality.  No effusions.  IMPRESSION: No acute disease in the chest.    04/17/2012 - CT HEAD WITHOUT CONTRAST  Findings: There is no evidence for acute hemorrhage, hydrocephalus, mass lesion, or abnormal extra-axial fluid collection.  No definite CT evidence for acute infarction.  Lacunar infarctions seen in the basal ganglia bilaterally.  Right cerebellar lacunar infarct is unchanged.  Lacunar infarct in the inferior left cerebellum is new in the interval, but age indeterminate on today's scan.  Bone windows show chronic left maxillary sinus disease, as before. The remaining visualized paranasal sinuses and mastoid air cells are clear.  IMPRESSION: Left inferior cerebellar lacunar infarct is new in the interval but has age indeterminate imaging features.  Otherwise  stable with chronic microvascular ischemic disease.      EKG: 04/17/12 - Atrial Flutter w/ variable conduction 95bpm, nonspecific ST/T changes  Physical Exam: Blood pressure 125/71, pulse 69, temperature 97.3 F (36.3 C), temperature source Oral, resp. rate 20, height 6\' 1"  (1.854 m), weight 185 lb (83.915 kg), SpO2 100.00%. General: Well developed, elderly white male in no acute distress. Head: Left facial droop. Normocephalic, atraumatic, sclera non-icteric, no xanthomas, nares are without discharge.  Neck: Supple. Negative for carotid bruits or JVD Lungs: Clear bilaterally to auscultation without wheezes, rales, or rhonchi. Breathing is unlabored. Heart: RRR with S1 S2. No murmurs, rubs, or gallops appreciated. Abdomen: Soft, non-tender, non-distended with normoactive bowel sounds. No hepatomegaly. No rebound/guarding. No obvious abdominal masses. Msk:  Strength and tone appear normal for age. Extremities: No clubbing or cyanosis. No edema.  Distal pedal pulses are intact and equal bilaterally. Neuro: Alert and oriented X 3. Moves all extremities spontaneously. Psych:  Responds to questions appropriately with a normal affect.   Assessment and Plan:  79 y.o. male w/ PMHx significant for Ramsay Hunt Syndrome, HTN and CVA who presented to [facility] on 04/17/2012 with complaints of slurred speech  1. TIA w/ h/o CVA 2. Atrial Flutter, newly diagnosed 3. Hypertension 4. Ramsay Hunt Syndrome  See MD note below  Signed, Pammie Chirino PA-C 04/18/2012, 2:04 PM  History and all data above reviewed.  Patient examined.  I agree with the findings as above.  The patient presents with his third neuro event without residual deficit.  Now he is found to have paroxysmal atrial flutter.  He does not feel any palpitations.  The patient denies any new symptoms such as chest discomfort, neck or arm discomfort. There has been no new shortness of breath, PND or orthopnea. There have been no  reported  presyncope or syncopal episodes.The patient exam reveals COR:RRR  ,  Lungs: Clear  ,  Abd: Positive bowel sounds, no rebound no guarding, Ext No edema  .  All available labs, radiology testing, previous records reviewed. Agree with documented assessment and plan. The atrial arrhythmia is very likely the culprit for his otherwise unexplained neuro events.  He is good candidate for Pradaxa.  ASA and Plavix will be stopped.  Continue Cardizem. No other workup is indicated at this point.  We will follow him in the office.    Fayrene Fearing Hochrein  4:47 PM  04/18/2012

## 2012-04-18 NOTE — Progress Notes (Signed)
Pt dc instructions provided. Pt verbalized understanding. Iv dc intact. Pt under no s/s distress.

## 2012-04-18 NOTE — Evaluation (Signed)
Physical Therapy Evaluation Patient Details Name: [name] [name] MRN: [id number] DOB: 05/20/1930 Today's Date: 04/18/2012 Time: 4098-1191 PT Time Calculation (min): 16 min  PT Assessment / Plan / Recommendation Clinical Impression  Pt is a very pleasent 79 y.o. male with hx of stroke, s/p TIA. Patient demonstrates good functional mobility and appears at baseline at this time. Do not feel patient will need continued skilled PT services as of now. Pt has completed stair negotiation and is safe for d/c home with spouse.     PT Assessment  Patent does not need any further PT services                      Precautions / Restrictions     Pertinent Vitals/Pain No pain      Mobility  Bed Mobility Bed Mobility: Not assessed Transfers Transfers: Sit to Stand;Stand to Sit Sit to Stand: 7: Independent;From chair/3-in-1 Stand to Sit: 7: Independent;To chair/3-in-1 Ambulation/Gait Ambulation/Gait Assistance: 7: Independent Ambulation Distance (Feet): 200 Feet Assistive device: None Ambulation/Gait Assistance Details: pt very steady with ambulation Gait Pattern: Within Functional Limits Stairs: Yes Stairs Assistance: 6: Modified independent (Device/Increase time) Stairs Assistance Details (indicate cue type and reason): use of rail Stair Management Technique: One rail Right Number of Stairs: 5  Modified Rankin (Stroke Patients Only) Pre-Morbid Rankin Score: No significant disability Modified Rankin: No significant disability      Visit Information  Last PT Received On: 04/18/12 Assistance Needed: +1    Subjective Data  Subjective: I feel pretty good Patient Stated Goal: to go home   Prior Functioning  Home Living Lives With: Spouse Available Help at Discharge: Family;Available 24 hours/day Type of Home: House Home Access: Stairs to enter [employer] of Steps: 3 Entrance Stairs-Rails: Right Home Layout: Two level Alternate Level Stairs-Number of Steps:  10 Alternate Level Stairs-Rails: Right Bathroom Toilet: Standard Prior Function Level of Independence: Independent Able to Take Stairs?: Yes Driving: Yes Vocation: Retired Musician:  (some slurred speech at baseline) Dominant Hand: Right    Cognition  Overall Cognitive Status: Appears within functional limits for tasks assessed/performed Arousal/Alertness: Awake/alert Orientation Level: Appears intact for tasks assessed;Oriented X4 / Intact Behavior During Session: [facility] for tasks performed    Extremity/Trunk Assessment Right Upper Extremity Assessment RUE ROM/Strength/Tone: WFL for tasks assessed RUE Sensation: WFL - Light Touch RUE Coordination: WFL - gross/fine motor Left Upper Extremity Assessment LUE ROM/Strength/Tone: WFL for tasks assessed LUE Sensation: WFL - Light Touch LUE Coordination: WFL - gross/fine motor Right Lower Extremity Assessment RLE ROM/Strength/Tone: WFL for tasks assessed RLE Sensation: WFL - Light Touch RLE Coordination: WFL - gross/fine motor Left Lower Extremity Assessment LLE ROM/Strength/Tone: WFL for tasks assessed LLE Sensation: WFL - Light Touch LLE Coordination: WFL - gross motor   Balance Balance Balance Assessed: Yes Standardized Balance Assessment Standardized Balance Assessment: Dynamic Gait Index Dynamic Gait Index Level Surface: Normal Change in Gait Speed: Normal Gait with Horizontal Head Turns: Normal Gait with Vertical Head Turns: Normal Gait and Pivot Turn: Normal Step Over Obstacle: Normal Step Around Obstacles: Normal Steps: Normal Total Score: 24  High Level Balance High Level Balance Activites: Side stepping;Backward walking;Direction changes;Turns;Head turns;Sudden stops High Level Balance Comments: steady   End of Session PT - End of Session Equipment Utilized During Treatment: Gait belt Activity Tolerance: Patient tolerated treatment well Patient left: in chair;with call bell/phone within reach   GP     Fabio Asa 04/18/2012, 4:16 PM  Charlotte Crumb, PT DPT  [phone]  

## 2012-04-18 NOTE — Progress Notes (Signed)
Stroke Team Progress Note  HISTORY [name] [name] is an 79 y.o. male who was recently seen at [facility] back in December 2012 for a left parietal infarct. Patient was placed on Plavix at that time. This past Sunday 04/14/2012 patient had a brief period of expressive aphasia which had fully resolved but was taken to the hospital at [facility]. There a MRI was obtained which patient states he was told he did have a infarct. He cannot tell me where in the brain. In addition Carotids and Echo were obtained and these findings are currently being retrieved. ASA was added to his daily Plavix. Today 04/17/2012 patient had a second episode of expressive aphasia which lasted for about one hour then fully resolved. He currently is NSR and denies any hear palpitations. He is now back to his baseline. Patient was not a TPA candidate secondary to stroke 3 days prior to arrival. He was admitted  for further evaluation and treatment.  SUBJECTIVE His wife is at the bedside.  Overall he feels his condition is completely resolved.   OBJECTIVE Most recent Vital Signs: Filed Vitals:   04/17/12 2231 04/18/12 0158 04/18/12 0539 04/18/12 0932  BP: 128/76 114/62 130/73 115/73  Pulse: 62 61 65 69  Temp: 97.6 F (36.4 C) 97.4 F (36.3 C) 97.6 F (36.4 C) 97.5 F (36.4 C)  TempSrc:  Oral Oral Oral  Resp: 18 20 20 18   Height:      Weight:      SpO2: 98% 98% 98% 100%   CBG (last 3)  Basename 04/18/12 1059 04/18/12 0640 04/17/12 2159  GLUCAP 113* 96 114*   IV Fluid Intake:     MEDICATIONS    . aspirin EC  325 mg Oral Daily  . atorvastatin  10 mg Oral q1800  . clopidogrel  75 mg Oral Daily  . diltiazem  120 mg Oral BH-q7a  . heparin  5,000 Units Subcutaneous Q8H  . LORazepam  1 mg Intravenous Once  . metoCLOPramide  5 mg Oral QID  . pantoprazole  40 mg Oral Daily  . sucralfate  1 g Oral QID   PRN:  acetaminophen  Diet:  Cardiac thin liquids Activity:  Bathroom privileges with assistance DVT  Prophylaxis:  Heparin 5000 units sq tid  CLINICALLY SIGNIFICANT STUDIES Basic Metabolic Panel:  Lab 04/17/12 1610  NA 138  K 4.3  CL 106  CO2 --  GLUCOSE 121*  BUN 30*  CREATININE 1.40*  CALCIUM --  MG --  PHOS --   Liver Function Tests: No results found for this basename: AST:2,ALT:2,ALKPHOS:2,BILITOT:2,PROT:2,ALBUMIN:2 in the last 168 hours CBC:  Lab 04/17/12 1048 04/17/12 1040  WBC -- 7.1  NEUTROABS -- 4.7  HGB 12.6* 12.3*  HCT 37.0* 35.4*  MCV -- 98.6  PLT -- 223   Coagulation:  Lab 04/17/12 1040  LABPROT 13.7  INR 1.06   Cardiac Enzymes: No results found for this basename: CKTOTAL:3,CKMB:3,CKMBINDEX:3,TROPONINI:3 in the last 168 hours Urinalysis:  Lab 04/17/12 1245  COLORURINE YELLOW  LABSPEC 1.030  PHURINE 7.0  GLUCOSEU NEGATIVE  HGBUR NEGATIVE  BILIRUBINUR NEGATIVE  KETONESUR 15*  PROTEINUR 30*  UROBILINOGEN 1.0  NITRITE NEGATIVE  LEUKOCYTESUR NEGATIVE   Lipid Panel from [facility]:    Component Value Date   CHOL 91 04/15/2012   TRIG 59 04/15/2012   HDL 59 04/15/2012    LDLCALC 28 04/15/2012    HgbA1C  Lab Results  Component Value Date   HGBA1C 5.7* 03/13/2011  Urine Drug Screen:   No results found for this basename: labopia, cocainscrnur, labbenz, amphetmu, thcu, labbarb    Alcohol Level: No results found for this basename: ETH:2 in the last 168 hours  CT of the brain  04/17/2012   Left inferior cerebellar lacunar infarct is new in the interval but has age indeterminate imaging features.  Otherwise stable with chronic microvascular ischemic disease.     MRI of the brain from [facility]: Left cerebellar infartct  MRA of the brain    2D Echocardiogram  from [facility] 04/14/2012: No significant valve dz. EF 55-65% with no source of embolus. No wall motion abnormalities  Carotid Doppler  from [facility] 04/15/2012: < 50% stenosis bilat  CXR  04/17/2012   No acute disease in the chest.       EKG  atrial fibrillation, rate 95.    Therapy Recommendations none  Physical Exam  Pleasant elderly caucasian male not in distress.Awake alert. Afebrile. Head is nontraumatic. Neck is supple without bruit. Hearing is normal. Cardiac exam no murmur or gallop. Lungs are clear to auscultation. Distal pulses are well felt.  Neurological Exam ;  Awake  Alert oriented x 3. Normal speech and language.eye movements full without nystagmus.fundi were not visualized. Vision acuity and fields appear normal. Face asymmetric with left half of the face weak from an old Bell's palsy.. Tongue midline. Normal strength, tone, reflexes and coordination. Normal sensation. Gait deferred.  ASSESSMENT [name] [name] is a 79 y.o. male presenting with intermittent expressive aphasia. Imaging done at [facility] confirms a infarct per patient; await copies of his records from there. Infarct felt to be embolic secondary to new dx of atrial fibrillation.  On aspirin 325 mg orally every day and clopidogrel 75 mg orally every day prior to admission. Now on aspirin 325 mg orally every day and clopidogrel 75 mg orally every day for secondary stroke prevention. Patient with resultant neuro deficits that have cleared. Work up underway.   Left Bells palsy at age 64 Hypertension Creatinine 1.4 Hyperlipidemia, LDL pending, on statin PTA, on statin now, goal LDL < 100 Recent admission at [facility] with a limited MRI scan showing small left cerebellar infarct but clinical symptoms were for a left hemispheric TIA. Carotid Dopplers, transthoracic echo and lipid profile were normal  Hospital day # 1  TREATMENT/PLAN  Recommend change to  one of the new oral anticoagulants for secondary stroke prevention. Will start Pradaxa at this time; stop aspirin and plavix  Cardiology consult to address new atrial fibrillation   Check TCD and Check HgbA1c. Rest of workup done in [facility], MSN, RN, ANVP-BC, ANP-BC, [facility] Pager: [phone] 04/18/2012 11:18 AM  I have personally obtained a history, examined the patient, evaluated imaging results, and formulated the assessment and plan of care. I agree with the above.  Delia Heady, MD Medical Director [facility] Pager: [phone] 04/18/2012 4:36 PM

## 2012-04-18 NOTE — ED Provider Notes (Signed)
This was a shared encounter.  On exam the patient's neurologic deficits of results and.  However, the patient's new atrial fibrillation.  We obtained records from his recent hospitalization in [place], and the patient was admitted for further evaluation, management.  I saw the ECG, relevant labs and studies - I agree with the interpretation.   Gerhard Munch, MD 04/18/12 1606

## 2012-04-18 NOTE — Discharge Summary (Signed)
Physician Discharge Summary  SHELTON SQUARE ZOX:[id number] DOB: 05-19-1930 DOA: 04/17/2012  PCP: Miguel Aschoff, MD  Admit date: 04/17/2012 Discharge date: 04/18/2012  Time spent:  50 minutes  Recommendations for Outpatient Follow-up:  1. Followup with PCP in 2 weeks. Follow patient's hemoglobin A1c results in office 2. Followup cardiology in one to 2 weeks 3. Followup neurology in 2 months  Discharge Diagnoses:  Active Problems:  HYPERTENSION, BENIGN SYSTEMIC  CVA (cerebral infarction)  TIA (transient ischemic attack)  Atrial fibrillation  Gastroparesis   Discharge Condition: Stable  Diet recommendation: Low-salt diet  Filed Weights   04/17/12 1006  Weight: 83.915 kg (185 lb)    History of present illness:  [name] [name] is an 79 y.o. male who was recently seen at [facility] back in December 2012 for a left parietal infarct. Patient was placed on Plavix at that time. This past Sunday 04/14/2012 patient had a brief period of expressive aphasia which had fully resolved but was taken to the hospital at [facility]. There a MRI was obtained which patient states he was told he did have a infarct. He cannot tell me where in the brain. In addition Carotids and Echo were obtained and these findings are currently being retrieved. ASA was added to his daily Plavix. Today 04/17/2012 patient had a second episode of expressive aphasia which lasted for about one hour then fully resolved. He currently is NSR and denies any hear palpitations. He is now back to his baseline. Patient was not a TPA candidate secondary to stroke 3 days prior to arrival. He was admitted for further evaluation and treatment.   Hospital Course:  CVA Patient had period of expressive aphasia, and he was taken to hospital at [facility]. Workup was done over there are, MRI was partially done which showed infarct. Patient then came to [facility] after he had a similar episode at home. Neurology saw the  patient and no new workup was ordered as most records were available except for the MRI. Patient was offered to get the MRI done which he refused as he has claustrophobia. At this time neurology does not recommend further MRIs and they have recommended to start the patient on pradaxa, as patient has approximately fibrillation for secondary stroke prophylaxis. Will discontinue aspirin and Plavix as per neurology recommendation  Paroxysmal atrial fibrillation Patient was seen by cardiology, and started on pradaxa, patient will follow up with them as outpatient  Hypertension Patient will continue on lisinopril, Cardizem  Hyperlipidemia Continue Crestor  Procedures:  None  Consultations:  Neurology  Cardiology  Discharge Exam: Filed Vitals:   04/18/12 0158 04/18/12 0539 04/18/12 0932 04/18/12 1340  BP: 114/62 130/73 115/73 125/71  Pulse: 61 65 69 69  Temp: 97.4 F (36.3 C) 97.6 F (36.4 C) 97.5 F (36.4 C) 97.3 F (36.3 C)  TempSrc: Oral Oral Oral Oral  Resp: 20 20 18 20   Height:      Weight:      SpO2: 98% 98% 100% 100%    *  Discharge Instructions  Discharge Orders    Future Orders Please Complete By Expires   Diet - low sodium heart healthy      Increase activity slowly          Medication List     As of 04/18/2012  4:33 PM    STOP taking these medications         aspirin 325 MG EC tablet      clopidogrel 75  MG tablet   Commonly known as: PLAVIX      TAKE these medications         b complex vitamins capsule   Take 1 capsule by mouth daily.      beta carotene w/minerals tablet   Take 1 tablet by mouth 2 (two) times daily.      CARDIZEM CD 120 MG 24 hr capsule   Generic drug: diltiazem   Take 120 mg by mouth every morning.      dabigatran 150 MG Caps   Commonly known as: PRADAXA   Take 1 capsule (150 mg total) by mouth every 12 (twelve) hours.      Fish Oil 1000 MG Caps   Take 1 capsule by mouth 2 (two) times daily.      lisinopril 5 MG  tablet   Commonly known as: PRINIVIL,ZESTRIL   Take 5 mg by mouth daily.      metoCLOPramide 5 MG tablet   Commonly known as: REGLAN   Take 5 mg by mouth 4 (four) times daily.      omeprazole 20 MG capsule   Commonly known as: PRILOSEC   Take 1 capsule by mouth 2 (two) times daily.      rosuvastatin 10 MG tablet   Commonly known as: CRESTOR   Take 10 mg by mouth daily.      silodosin 8 MG Caps capsule   Commonly known as: RAPAFLO   Take 8 mg by mouth daily with breakfast.      sucralfate 1 G tablet   Commonly known as: CARAFATE   Take 1 g by mouth 4 (four) times daily.      VITAMIN D-3 PO   Take 5,000 Units by mouth daily.           Follow-up Information    Follow up with Verne Carrow, MD. (Our office will call you with your followup appointment as well as an education appointment in our blood thinner clinic. Call office on Monday if you haven't heard from [place].)    Contact information:   [facility] [address] [place] [address] [phone]       Follow up with Gates Rigg, MD. In 2 months.   Contact information:   [address], [address] [facility] [place] [address] [phone]       Follow up with [facility], MD. In 2 weeks.   Contact information:   [address] [place] [address] [phone]           The results of significant diagnostics from this hospitalization (including imaging, microbiology, ancillary and laboratory) are listed below for reference.    Significant Diagnostic Studies: Dg Chest 2 View  04/17/2012  *RADIOLOGY REPORT*  Clinical Data: Right-sided weakness.  CHEST - 2 VIEW  Comparison: 03/18/2011  Findings: The heart size and pulmonary vascularity are normal. There is tortuosity of the thoracic aorta.  The lungs are clear. No significant osseous abnormality.  No effusions.  IMPRESSION: No acute disease in the chest.   Original Report Authenticated  By: Francene Boyers, M.D.    Ct Head Wo Contrast  04/17/2012  *RADIOLOGY REPORT*  Clinical Data: Temporary loss of speech.  CT HEAD WITHOUT CONTRAST  Technique:  Contiguous axial images were obtained from the base of the skull through the vertex without contrast.  Comparison: 03/12/2011  Findings: There is no evidence for acute hemorrhage, hydrocephalus, mass lesion, or abnormal extra-axial fluid collection.  No  definite CT evidence for acute infarction.  Lacunar infarctions seen in the basal ganglia bilaterally.  Right cerebellar lacunar infarct is unchanged.  Lacunar infarct in the inferior left cerebellum is new in the interval, but age indeterminate on today's scan.  Bone windows show chronic left maxillary sinus disease, as before. The remaining visualized paranasal sinuses and mastoid air cells are clear.  IMPRESSION: Left inferior cerebellar lacunar infarct is new in the interval but has age indeterminate imaging features.  Otherwise stable with chronic microvascular ischemic disease.   Original Report Authenticated By: Kennith Center, M.D.     Microbiology: No results found for this or any previous visit (from the past 240 hour(s)).   Labs: Basic Metabolic Panel:  Lab 04/17/12 1610  NA 138  K 4.3  CL 106  CO2 --  GLUCOSE 121*  BUN 30*  CREATININE 1.40*  CALCIUM --  MG --  PHOS --   Liver Function Tests: No results found for this basename: AST:5,ALT:5,ALKPHOS:5,BILITOT:5,PROT:5,ALBUMIN:5 in the last 168 hours No results found for this basename: LIPASE:5,AMYLASE:5 in the last 168 hours No results found for this basename: AMMONIA:5 in the last 168 hours CBC:  Lab 04/17/12 1048 04/17/12 1040  WBC -- 7.1  NEUTROABS -- 4.7  HGB 12.6* 12.3*  HCT 37.0* 35.4*  MCV -- 98.6  PLT -- 223   Cardiac Enzymes: No results found for this basename: CKTOTAL:5,CKMB:5,CKMBINDEX:5,TROPONINI:5 in the last 168 hours BNP: BNP (last 3 results) No results found for this basename: PROBNP:3 in the  last 8760 hours CBG:  Lab 04/18/12 1059 04/18/12 0640 04/17/12 2159 04/17/12 1041  GLUCAP 113* 96 114* 121*       Signed:  Leya Paige S  [facility] 04/18/2012, 4:33 PM

## 2012-04-19 ENCOUNTER — Ambulatory Visit: Payer: Medicare Other | Admitting: Cardiovascular Disease

## 2012-04-19 LAB — HEMOGLOBIN A1C: Hgb A1c MFr Bld: 5.7 % — ABNORMAL HIGH (ref <5.7)

## 2012-04-19 NOTE — Care Management Note (Signed)
    Page 1 of 1   04/19/2012     8:54:58 AM   CARE MANAGEMENT NOTE 04/19/2012  Patient:  [name] [name], [name] [name]   Account Number:  [id number]  Date Initiated:  04/19/2012  Documentation initiated by:  [facility]  Subjective/Objective Assessment:   admitted with TIA     Action/Plan:   PT/OT evals-no follow up needs identified   Anticipated DC Date:     Anticipated DC Plan:        DC Planning Services  CM consult      Choice offered to / List presented to:             Status of service:  Completed, signed off Medicare Important Message given?   (If response is "NO", the following Medicare IM given date fields will be blank) Date Medicare IM given:   Date Additional Medicare IM given:    Discharge Disposition:  HOME/SELF CARE  Per UR Regulation:  Reviewed for med. necessity/level of care/duration of stay  If discussed at Long Length of Stay Meetings, dates discussed:    Comments:

## 2012-05-07 ENCOUNTER — Encounter: Payer: Self-pay | Admitting: Cardiovascular Disease

## 2012-05-07 ENCOUNTER — Ambulatory Visit (INDEPENDENT_AMBULATORY_CARE_PROVIDER_SITE_OTHER): Payer: Medicare Other | Admitting: Cardiovascular Disease

## 2012-05-07 VITALS — BP 125/79 | HR 71 | Ht 73.0 in | Wt 187.8 lb

## 2012-05-07 DIAGNOSIS — I4892 Unspecified atrial flutter: Secondary | ICD-10-CM

## 2012-05-07 NOTE — Progress Notes (Signed)
History of Present Illness: 79 yo WM with history of HTN, BPH, former tobacco abuse (stopped 1985), OA, CVA/TIA and new diagnosis of atrial flutter who is here today for cardiac follow up. I saw him in April 2012 to establish cardiology care.He told me that he had a CT scan of the chest in November 2011 for f/u of pulmonary nodules. His coronary arteries showed calcification. He described exertional chest pain at our first visit. I ordered an echo and an exercise stress myoview. His myoview showed normal perfusion with small apical defect. He exercised for 12 minutes with no chest pain and no EKG changes. Echo with normal LV function, biatrial enlargement, no significant valvular issues. I saw him in May 2012 and recommended conservative approach as he was feeling well and having no chest pain.He was seen in the ED in September 2012 for chest pain and had negative tests. He had a CVA on 03/12/11 and was admitted to [facility]. Echo with normal LV function. Carotid artery dopplers are normal. He described right sided arm and leg weakness. He completely recovered neurologically. He was started on Plavix in the hospital after his stroke in 2012. He was hospitalized in [place], [place] 1/26-1/28/14 for TIA vs CVA. During hospitalization (per records in paper chart) echo again showed nl LV systolic fxn 55-65%, no WMAs, or cardiac source of emboli. ASA was added to his regimen. He was discharged home but was admitted to [facility] on 04/17/12 with speech deficits. In the ED EKG showed Atrial Flutter w/ variable conduction 95bpm, nonspecific ST/T changes. He spontaneously converted to NSR. His speech returned to normal after about one hour. He was seen by Dr. Antoine Poche at [facility] on 04/18/12. His ASA and Plavix was stopped and he was started on Pradaxa for anticoagulation.   He is here today for follow up. He is feeling well. No chest pain, SOB, awareness of palpitations. No dizziness, near syncope or syncope. No bleeding problems on  Pradaxa.   Primary Care Physician: Dr. Gildardo Cranker. [facility])  Last Lipid Profile:Lipid Panel     Component Value Date/Time   CHOL 100 04/18/2012 0610   TRIG 61 04/18/2012 0610   HDL 60 04/18/2012 0610   CHOLHDL 1.7 04/18/2012 0610   VLDL 12 04/18/2012 0610   LDLCALC 28 04/18/2012 0610     Past Medical History  Diagnosis Date  . Ramsay Hunt auricular syndrome   . HTN (hypertension)   . Osteoarthritis   . Dysrhythmia 04/17/12  . Anxiety   . Stroke   . H/O Bell's palsy at age 10  . Atrial flutter     Past Surgical History  Procedure Laterality Date  . Hernia repair    . Vasectomy    . Prostate biopsies      per Vonita Moss  . Cataract extraction      SE eye    Current Outpatient Prescriptions  Medication Sig Dispense Refill  . b complex vitamins capsule Take 1 capsule by mouth daily.      . beta carotene w/minerals (OCUVITE) tablet Take 1 tablet by mouth 2 (two) times daily.        . Cholecalciferol (VITAMIN D-3 PO) Take 5,000 Units by mouth daily.        . dabigatran (PRADAXA) 150 MG CAPS Take 1 capsule (150 mg total) by mouth every 12 (twelve) hours.  60 capsule  2  . diltiazem (CARDIZEM CD) 120 MG 24 hr capsule Take 120 mg by mouth every morning.       Marland Kitchen  lisinopril (PRINIVIL,ZESTRIL) 5 MG tablet Take 5 mg by mouth daily.      . metoCLOPramide (REGLAN) 5 MG tablet Take 5 mg by mouth 4 (four) times daily.      . Omega-3 Fatty Acids (FISH OIL) 1000 MG CAPS Take 1 capsule by mouth 2 (two) times daily.       Marland Kitchen omeprazole (PRILOSEC) 20 MG capsule Take 1 capsule by mouth 2 (two) times daily.       . rosuvastatin (CRESTOR) 10 MG tablet Take 10 mg by mouth daily.      . silodosin (RAPAFLO) 8 MG CAPS capsule Take 8 mg by mouth daily with breakfast.      . sucralfate (CARAFATE) 1 G tablet Take 1 g by mouth 4 (four) times daily.       No current facility-administered medications for this visit.    Allergies  Allergen Reactions  . Penicillins Rash    Rash appeared on palms of  hands and feet.    History   Social History  . Marital Status: Married    Spouse Name: Harriett Sine    Number of Children: 2  . Years of Education: N/A   Occupational History  . 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   Social History Main Topics  . Smoking status: Former Smoker    Quit date: 06/22/1984  . Smokeless tobacco: Never Used  . Alcohol Use: 3.5 oz/week    7 drink(s) per week     Comment: 1 glass of wine a day  . Drug Use: No  . Sexually Active: Not on file   Other Topics Concern  . Not on file   Social History Narrative   Health Care POA: wife, Thereasa Parkin   Emergency Contact: Thereasa Parkin, (c) [phone]   End of Life Plan: DNR   Who lives with you: Lives with wife, Harriett Sine   Any pets: coon hound, Cookie   Diet: Patient has a varied diet of protein, starch, vegetables.   Exercise: Patient walks or does yoga 5 x a week.   Seatbelts: Patient reports wearing a seat belt when in vehicle.   Sun Exposure/Protection: Patient reports infrequent use of sun screen.   Hobbies: Walking, running, politics          Family History  Problem Relation Age of Onset  . Cancer Father   . Diabetes Father     Review of Systems:  As stated in the HPI and otherwise negative.   BP 125/79  Pulse 71  Ht 6\' 1"  (1.854 m)  Wt 187 lb 12.8 oz (85.186 kg)  BMI 24.78 kg/m2  Physical Examination: General: Well developed, well nourished, NAD HEENT: OP clear, mucus membranes moist SKIN: warm, dry. No rashes. Neuro: No focal deficits Musculoskeletal: Muscle strength 5/5 all ext Psychiatric: Mood and affect normal Neck: No JVD, no carotid bruits, no thyromegaly, no lymphadenopathy. Lungs:Clear bilaterally, no wheezes, rhonci, crackles Cardiovascular: Regular rate and rhythm. No murmurs, gallops or rubs. Abdomen:Soft. Bowel sounds present. Non-tender. Ext: No edema  EKG: NSR, rate 71 bpm.   Assessment and Plan:   1. Atrial flutter: He is on Cardizem and is in  sinus today. He is on Pradaxa. No bleeding issues. No recurrent neurological issues. No indication for TEE as he will be maintained on long term anti-coagulation. I will see him back in 6-8 weeks.

## 2012-05-07 NOTE — Patient Instructions (Addendum)
Your physician recommends that you schedule a follow-up appointment in:  6-8 weeks. --June 26, 2012 at 2:00

## 2012-05-10 ENCOUNTER — Encounter (HOSPITAL_COMMUNITY): Payer: Self-pay

## 2012-05-10 ENCOUNTER — Emergency Department (HOSPITAL_COMMUNITY): Payer: Medicare Other

## 2012-05-10 ENCOUNTER — Emergency Department (HOSPITAL_COMMUNITY)
Admission: EM | Admit: 2012-05-10 | Discharge: 2012-05-10 | Disposition: A | Discharge status: Home / Self Care | Payer: Medicare Other | Attending: Emergency Medicine | Admitting: Emergency Medicine

## 2012-05-10 DIAGNOSIS — I10 Essential (primary) hypertension: Secondary | ICD-10-CM | POA: Insufficient documentation

## 2012-05-10 DIAGNOSIS — Z8679 Personal history of other diseases of the circulatory system: Secondary | ICD-10-CM | POA: Insufficient documentation

## 2012-05-10 DIAGNOSIS — K922 Gastrointestinal hemorrhage, unspecified: Secondary | ICD-10-CM | POA: Insufficient documentation

## 2012-05-10 DIAGNOSIS — Z79899 Other long term (current) drug therapy: Secondary | ICD-10-CM | POA: Insufficient documentation

## 2012-05-10 DIAGNOSIS — D649 Anemia, unspecified: Secondary | ICD-10-CM | POA: Insufficient documentation

## 2012-05-10 DIAGNOSIS — Z8673 Personal history of transient ischemic attack (TIA), and cerebral infarction without residual deficits: Secondary | ICD-10-CM | POA: Insufficient documentation

## 2012-05-10 DIAGNOSIS — I951 Orthostatic hypotension: Secondary | ICD-10-CM

## 2012-05-10 DIAGNOSIS — Z8669 Personal history of other diseases of the nervous system and sense organs: Secondary | ICD-10-CM | POA: Insufficient documentation

## 2012-05-10 DIAGNOSIS — Z87891 Personal history of nicotine dependence: Secondary | ICD-10-CM | POA: Insufficient documentation

## 2012-05-10 DIAGNOSIS — F411 Generalized anxiety disorder: Secondary | ICD-10-CM | POA: Insufficient documentation

## 2012-05-10 DIAGNOSIS — M1990 Unspecified osteoarthritis, unspecified site: Secondary | ICD-10-CM | POA: Insufficient documentation

## 2012-05-10 DIAGNOSIS — E860 Dehydration: Secondary | ICD-10-CM | POA: Insufficient documentation

## 2012-05-10 LAB — URINALYSIS, ROUTINE W REFLEX MICROSCOPIC
Bilirubin Urine: NEGATIVE
Ketones, ur: 15 mg/dL — AB
Nitrite: NEGATIVE
Protein, ur: 100 mg/dL — AB
Specific Gravity, Urine: 1.029 (ref 1.005–1.030)
Urobilinogen, UA: 1 mg/dL (ref 0.0–1.0)

## 2012-05-10 LAB — POCT I-STAT, CHEM 8
BUN: 23 mg/dL (ref 6–23)
Calcium, Ion: 1.25 mmol/L (ref 1.13–1.30)
Creatinine, Ser: 1.3 mg/dL (ref 0.50–1.35)
Glucose, Bld: 99 mg/dL (ref 70–99)
Hemoglobin: 10.2 g/dL — ABNORMAL LOW (ref 13.0–17.0)
Sodium: 137 mEq/L (ref 135–145)
TCO2: 27 mmol/L (ref 0–100)

## 2012-05-10 LAB — CBC WITH DIFFERENTIAL/PLATELET
Basophils Absolute: 0 10*3/uL (ref 0.0–0.1)
Basophils Relative: 0 % (ref 0–1)
Eosinophils Absolute: 0.1 10*3/uL (ref 0.0–0.7)
HCT: 28.9 % — ABNORMAL LOW (ref 39.0–52.0)
MCH: 34.7 pg — ABNORMAL HIGH (ref 26.0–34.0)
MCHC: 35.3 g/dL (ref 30.0–36.0)
Monocytes Absolute: 1.2 10*3/uL — ABNORMAL HIGH (ref 0.1–1.0)
Neutro Abs: 6.2 10*3/uL (ref 1.7–7.7)
RDW: 13 % (ref 11.5–15.5)

## 2012-05-10 LAB — URINE MICROSCOPIC-ADD ON

## 2012-05-10 MED ORDER — SODIUM CHLORIDE 0.9 % IV BOLUS (SEPSIS): 500.0000 mL | Freq: Once | INTRAVENOUS | Status: DC

## 2012-05-10 MED ORDER — SODIUM CHLORIDE 0.9 % IV BOLUS (SEPSIS)
1000.0000 mL | Freq: Once | INTRAVENOUS | Status: AC
  Administered 2012-05-10: 1000 mL via INTRAVENOUS

## 2012-05-10 NOTE — ED Notes (Signed)
The pt is alert no complaint.  He reports that he feels better. bp increased

## 2012-05-10 NOTE — ED Notes (Signed)
Pt presents from [place] office with report of hypotension.  Pt reports dizziness after awakening, took his SBP in 80's.  Doctor's office had SBP in 80's as well.

## 2012-05-10 NOTE — ED Provider Notes (Signed)
History     CSN: [id number]  Arrival date & time 05/10/12  1310   First MD Initiated Contact with Patient 05/10/12 1316      Chief Complaint  Patient presents with  . Dizziness    (Consider location/radiation/quality/duration/timing/severity/associated sxs/prior treatment) HPI Comments: 79 year old male with a history of hypertension, Ramsay Hunt syndrome, atrial flutter and stroke who presents with a complaint of lightheadedness and generalized weakness. The patient was seen at his family doctor's office today and found to be hypotensive, he was sent to the emergency department for further evaluation and resuscitation. The patient denies any new medication changes including his antihypertensives. He does admit to feeling severely lightheaded when he stands up, he denies vertigo, denies vomiting but does admit to having diarrhea for the last several days. Symptoms are worse with standing, persistent even when he sitting or laying down, not associated with fevers, headache, blurred vision, weakness in the arms or legs.  He does admit to starting Pradaxa recently after stroke 2 months ago  The history is provided by the patient and a relative.    Past Medical History  Diagnosis Date  . Ramsay Hunt auricular syndrome   . HTN (hypertension)   . Osteoarthritis   . Dysrhythmia 04/17/12  . Anxiety   . Stroke   . H/O Bell's palsy at age 42  . Atrial flutter     Past Surgical History  Procedure Laterality Date  . Hernia repair    . Vasectomy    . Prostate biopsies      per Vonita Moss  . Cataract extraction      SE eye    Family History  Problem Relation Age of Onset  . Cancer Father   . Diabetes Father     History  Substance Use Topics  . Smoking status: Former Smoker    Quit date: 06/22/1984  . Smokeless tobacco: Never Used  . Alcohol Use: 3.5 oz/week    7 drink(s) per week     Comment: 1 glass of wine a day      Review of Systems  All other systems reviewed and  are negative.    Allergies  Lactose intolerance (gi) and Penicillins  Home Medications   Current Outpatient Rx  Name  Route  Sig  Dispense  Refill  . b complex vitamins capsule   Oral   Take 1 capsule by mouth daily.         . Cholecalciferol (VITAMIN D-3 PO)   Oral   Take 5,000 Units by mouth daily.           . dabigatran (PRADAXA) 150 MG CAPS   Oral   Take 1 capsule (150 mg total) by mouth every 12 (twelve) hours.   60 capsule   2   . diltiazem (CARDIZEM CD) 120 MG 24 hr capsule   Oral   Take 120 mg by mouth every morning.          . metoCLOPramide (REGLAN) 5 MG tablet   Oral   Take 5 mg by mouth 3 (three) times daily before meals.          . Multiple Vitamins-Minerals (PRESERVISION AREDS PO)   Oral   Take 1 tablet by mouth 2 (two) times daily.         . Omega-3 Fatty Acids (FISH OIL) 1000 MG CAPS   Oral   Take 1 capsule by mouth daily.          Marland Kitchen omeprazole (PRILOSEC) 20  MG capsule   Oral   Take 1 capsule by mouth 2 (two) times daily.          . rosuvastatin (CRESTOR) 10 MG tablet   Oral   Take 10 mg by mouth daily.         . silodosin (RAPAFLO) 8 MG CAPS capsule   Oral   Take 8 mg by mouth daily with breakfast.         . sucralfate (CARAFATE) 1 G tablet   Oral   Take 1 g by mouth 3 (three) times daily before meals.            BP 137/70  Pulse 70  Temp(Src) 98.2 F (36.8 C) (Oral)  Resp 18  SpO2 97%  Physical Exam  Nursing note and vitals reviewed. Constitutional: He appears well-developed and well-nourished. No distress.  HENT:  Head: Normocephalic and atraumatic.  Mouth/Throat: Oropharynx is clear and moist. No oropharyngeal exudate.  Eyes: Conjunctivae and EOM are normal. Pupils are equal, round, and reactive to light. Right eye exhibits no discharge. Left eye exhibits no discharge. No scleral icterus.  Neck: Normal range of motion. Neck supple. No JVD present. No thyromegaly present.  Cardiovascular: Normal rate,  regular rhythm, normal heart sounds and intact distal pulses.  Exam reveals no gallop and no friction rub.   No murmur heard. Pulmonary/Chest: Effort normal and breath sounds normal. No respiratory distress. He has no wheezes. He has no rales.  Abdominal: Soft. Bowel sounds are normal. He exhibits no distension and no mass. There is no tenderness.  Musculoskeletal: Normal range of motion. He exhibits no edema and no tenderness.  Lymphadenopathy:    He has no cervical adenopathy.  Neurological: He is alert. Coordination normal.  Skin: Skin is warm and dry. No rash noted. No erythema.  Psychiatric: He has a normal mood and affect. His behavior is normal.    ED Course  Procedures (including critical care time)  Labs Reviewed  CBC WITH DIFFERENTIAL - Abnormal; Notable for the following:    RBC 2.94 (*)    Hemoglobin 10.2 (*)    HCT 28.9 (*)    MCH 34.7 (*)    Monocytes Relative 13 (*)    Monocytes Absolute 1.2 (*)    All other components within normal limits  URINALYSIS, ROUTINE W REFLEX MICROSCOPIC - Abnormal; Notable for the following:    Color, Urine AMBER (*)    Ketones, ur 15 (*)    Protein, ur 100 (*)    Leukocytes, UA SMALL (*)    All other components within normal limits  URINE MICROSCOPIC-ADD ON - Abnormal; Notable for the following:    Squamous Epithelial / LPF FEW (*)    Bacteria, UA FEW (*)    Casts GRANULAR CAST (*)    Crystals CA OXALATE CRYSTALS (*)    All other components within normal limits  POCT I-STAT, CHEM 8 - Abnormal; Notable for the following:    Hemoglobin 10.2 (*)    HCT 30.0 (*)    All other components within normal limits  MAGNESIUM  POCT I-STAT TROPONIN I   No results found.   1. Orthostatic hypotension   2. Dehydration   3. Anemia   4. GI bleeding       MDM  The patient is well-appearing, he has clear heart and lung sounds, soft abdomen, no peripheral edema. He does have a normal blood pressure on arrival at 114/75, pulse of 67, sats  100% and he is  afebrile.  Will discuss with his family doctor, get laboratory workup, chest x-ray, fluid bolus and reevaluate.  ED ECG REPORT  I personally interpreted this EKG   Date: 05/13/2012   Rate: 70  Rhythm: normal sinus rhythm  QRS Axis: normal  Intervals: normal  ST/T Wave abnormalities: normal  Conduction Disutrbances:none  Narrative Interpretation:   Old EKG Reviewed: none available   I have discussed care with the pt's family doctor (on call doctor as Dr. Tenny Craw not in office today).  They have been informed as has the pt that there was a drop in the hemoglobin.  I have now performed a rectal exam and I have tested the stool to be faint hemoccult positive for blood but stool is brown.  His blood pressure is now over 110 systolic, he has not been tachycardic and has only a slight change in VS with orthostatics.  The pt will need to be seen on Monday in the office - UA pending at this time - IVF have been given, he appears stable for d/c.        Vida Roller, MD 05/13/12 [phone]

## 2012-05-11 ENCOUNTER — Other Ambulatory Visit: Payer: Self-pay

## 2012-05-30 ENCOUNTER — Encounter (HOSPITAL_COMMUNITY): Payer: Self-pay | Admitting: Emergency Medicine

## 2012-05-30 ENCOUNTER — Emergency Department (HOSPITAL_COMMUNITY): Payer: Medicare Other

## 2012-05-30 ENCOUNTER — Inpatient Hospital Stay (HOSPITAL_COMMUNITY)
Admission: EM | Admit: 2012-05-30 | Discharge: 2012-06-18 | DRG: 329 | Disposition: A | Discharge status: Rehabilitation Facility | Payer: Medicare Other | Attending: Internal Medicine | Admitting: Internal Medicine

## 2012-05-30 DIAGNOSIS — Z87891 Personal history of nicotine dependence: Secondary | ICD-10-CM

## 2012-05-30 DIAGNOSIS — K922 Gastrointestinal hemorrhage, unspecified: Secondary | ICD-10-CM

## 2012-05-30 DIAGNOSIS — R140 Abdominal distension (gaseous): Secondary | ICD-10-CM

## 2012-05-30 DIAGNOSIS — M436 Torticollis: Secondary | ICD-10-CM

## 2012-05-30 DIAGNOSIS — R195 Other fecal abnormalities: Secondary | ICD-10-CM

## 2012-05-30 DIAGNOSIS — I639 Cerebral infarction, unspecified: Secondary | ICD-10-CM

## 2012-05-30 DIAGNOSIS — I2489 Other forms of acute ischemic heart disease: Secondary | ICD-10-CM | POA: Diagnosis present

## 2012-05-30 DIAGNOSIS — C166 Malignant neoplasm of greater curvature of stomach, unspecified: Principal | ICD-10-CM | POA: Diagnosis present

## 2012-05-30 DIAGNOSIS — D638 Anemia in other chronic diseases classified elsewhere: Secondary | ICD-10-CM

## 2012-05-30 DIAGNOSIS — T4275XA Adverse effect of unspecified antiepileptic and sedative-hypnotic drugs, initial encounter: Secondary | ICD-10-CM | POA: Diagnosis not present

## 2012-05-30 DIAGNOSIS — R197 Diarrhea, unspecified: Secondary | ICD-10-CM | POA: Diagnosis present

## 2012-05-30 DIAGNOSIS — K920 Hematemesis: Secondary | ICD-10-CM

## 2012-05-30 DIAGNOSIS — G459 Transient cerebral ischemic attack, unspecified: Secondary | ICD-10-CM

## 2012-05-30 DIAGNOSIS — C169 Malignant neoplasm of stomach, unspecified: Secondary | ICD-10-CM

## 2012-05-30 DIAGNOSIS — Z7901 Long term (current) use of anticoagulants: Secondary | ICD-10-CM

## 2012-05-30 DIAGNOSIS — C770 Secondary and unspecified malignant neoplasm of lymph nodes of head, face and neck: Secondary | ICD-10-CM | POA: Diagnosis present

## 2012-05-30 DIAGNOSIS — K55059 Acute (reversible) ischemia of intestine, part and extent unspecified: Secondary | ICD-10-CM

## 2012-05-30 DIAGNOSIS — G9340 Encephalopathy, unspecified: Secondary | ICD-10-CM

## 2012-05-30 DIAGNOSIS — R471 Dysarthria and anarthria: Secondary | ICD-10-CM

## 2012-05-30 DIAGNOSIS — K3184 Gastroparesis: Secondary | ICD-10-CM

## 2012-05-30 DIAGNOSIS — F19921 Other psychoactive substance use, unspecified with intoxication with delirium: Secondary | ICD-10-CM | POA: Diagnosis not present

## 2012-05-30 DIAGNOSIS — I480 Paroxysmal atrial fibrillation: Secondary | ICD-10-CM | POA: Diagnosis present

## 2012-05-30 DIAGNOSIS — J9600 Acute respiratory failure, unspecified whether with hypoxia or hypercapnia: Secondary | ICD-10-CM | POA: Diagnosis not present

## 2012-05-30 DIAGNOSIS — R531 Weakness: Secondary | ICD-10-CM

## 2012-05-30 DIAGNOSIS — I959 Hypotension, unspecified: Secondary | ICD-10-CM | POA: Diagnosis not present

## 2012-05-30 DIAGNOSIS — E876 Hypokalemia: Secondary | ICD-10-CM

## 2012-05-30 DIAGNOSIS — R55 Syncope and collapse: Secondary | ICD-10-CM

## 2012-05-30 DIAGNOSIS — R5381 Other malaise: Secondary | ICD-10-CM | POA: Diagnosis not present

## 2012-05-30 DIAGNOSIS — F411 Generalized anxiety disorder: Secondary | ICD-10-CM | POA: Diagnosis present

## 2012-05-30 DIAGNOSIS — Y9210 Unspecified residential institution as the place of occurrence of the external cause: Secondary | ICD-10-CM | POA: Diagnosis not present

## 2012-05-30 DIAGNOSIS — I4892 Unspecified atrial flutter: Secondary | ICD-10-CM | POA: Diagnosis present

## 2012-05-30 DIAGNOSIS — Z79899 Other long term (current) drug therapy: Secondary | ICD-10-CM

## 2012-05-30 DIAGNOSIS — C785 Secondary malignant neoplasm of large intestine and rectum: Secondary | ICD-10-CM | POA: Diagnosis present

## 2012-05-30 DIAGNOSIS — M1990 Unspecified osteoarthritis, unspecified site: Secondary | ICD-10-CM | POA: Diagnosis present

## 2012-05-30 DIAGNOSIS — K5669 Other intestinal obstruction: Secondary | ICD-10-CM | POA: Diagnosis present

## 2012-05-30 DIAGNOSIS — C801 Malignant (primary) neoplasm, unspecified: Secondary | ICD-10-CM

## 2012-05-30 DIAGNOSIS — K56609 Unspecified intestinal obstruction, unspecified as to partial versus complete obstruction: Secondary | ICD-10-CM

## 2012-05-30 DIAGNOSIS — I4891 Unspecified atrial fibrillation: Secondary | ICD-10-CM | POA: Diagnosis present

## 2012-05-30 DIAGNOSIS — Z932 Ileostomy status: Secondary | ICD-10-CM

## 2012-05-30 DIAGNOSIS — I248 Other forms of acute ischemic heart disease: Secondary | ICD-10-CM | POA: Diagnosis present

## 2012-05-30 DIAGNOSIS — I10 Essential (primary) hypertension: Secondary | ICD-10-CM

## 2012-05-30 DIAGNOSIS — G4733 Obstructive sleep apnea (adult) (pediatric): Secondary | ICD-10-CM | POA: Diagnosis present

## 2012-05-30 DIAGNOSIS — R180 Malignant ascites: Secondary | ICD-10-CM | POA: Diagnosis present

## 2012-05-30 DIAGNOSIS — R1084 Generalized abdominal pain: Secondary | ICD-10-CM

## 2012-05-30 DIAGNOSIS — I5031 Acute diastolic (congestive) heart failure: Secondary | ICD-10-CM

## 2012-05-30 DIAGNOSIS — R0603 Acute respiratory distress: Secondary | ICD-10-CM | POA: Diagnosis present

## 2012-05-30 HISTORY — DX: Ileostomy status: Z93.2

## 2012-05-30 HISTORY — DX: Unspecified atrial fibrillation: I48.91

## 2012-05-30 LAB — CK TOTAL AND CKMB (NOT AT ARMC)
CK, MB: 8.3 ng/mL (ref 0.3–4.0)
Relative Index: INVALID (ref 0.0–2.5)
Relative Index: INVALID (ref 0.0–2.5)
Total CK: 95 U/L (ref 7–232)

## 2012-05-30 LAB — BASIC METABOLIC PANEL
BUN: 19 mg/dL (ref 6–23)
CO2: 27 mEq/L (ref 19–32)
Calcium: 9.6 mg/dL (ref 8.4–10.5)
Chloride: 101 mEq/L (ref 96–112)
Creatinine, Ser: 1.24 mg/dL (ref 0.50–1.35)
GFR calc Af Amer: 62 mL/min — ABNORMAL LOW (ref >90)

## 2012-05-30 LAB — URINALYSIS, ROUTINE W REFLEX MICROSCOPIC
Bilirubin Urine: NEGATIVE
Glucose, UA: NEGATIVE mg/dL
Ketones, ur: 40 mg/dL — AB
Protein, ur: 100 mg/dL — AB
pH: 6 (ref 5.0–8.0)

## 2012-05-30 LAB — URINE MICROSCOPIC-ADD ON

## 2012-05-30 LAB — CBC WITH DIFFERENTIAL/PLATELET
Basophils Absolute: 0 10*3/uL (ref 0.0–0.1)
Basophils Relative: 0 % (ref 0–1)
Eosinophils Relative: 0 % (ref 0–5)
HCT: 36.8 % — ABNORMAL LOW (ref 39.0–52.0)
Hemoglobin: 12.9 g/dL — ABNORMAL LOW (ref 13.0–17.0)
MCHC: 35.1 g/dL (ref 30.0–36.0)
MCV: 98.1 fL (ref 78.0–100.0)
Monocytes Absolute: 0.8 10*3/uL (ref 0.1–1.0)
Monocytes Relative: 6 % (ref 3–12)
Neutro Abs: 11.2 10*3/uL — ABNORMAL HIGH (ref 1.7–7.7)
RDW: 13.1 % (ref 11.5–15.5)

## 2012-05-30 LAB — OCCULT BLOOD, POC DEVICE: Fecal Occult Bld: POSITIVE — AB

## 2012-05-30 MED ORDER — DIGOXIN 0.25 MG/ML IJ SOLN
0.2500 mg | Freq: Once | INTRAMUSCULAR | Status: DC
  Filled 2012-05-30: qty 2

## 2012-05-30 MED ORDER — SODIUM CHLORIDE 0.9 % IV SOLN
INTRAVENOUS | Status: DC
  Administered 2012-05-30: 12:00:00 via INTRAVENOUS

## 2012-05-30 MED ORDER — PANTOPRAZOLE SODIUM 40 MG IV SOLR
40.0000 mg | Freq: Two times a day (BID) | INTRAVENOUS | Status: DC
  Administered 2012-05-30 – 2012-06-07 (×16): 40 mg via INTRAVENOUS
  Filled 2012-05-30 (×19): qty 40

## 2012-05-30 MED ORDER — ONDANSETRON HCL 4 MG/2ML IJ SOLN
4.0000 mg | Freq: Four times a day (QID) | INTRAMUSCULAR | Status: DC | PRN
  Administered 2012-06-02: 4 mg via INTRAVENOUS
  Filled 2012-05-30: qty 2

## 2012-05-30 MED ORDER — DIGOXIN 0.1 MG/ML IJ SOLN
0.2500 mg | Freq: Once | INTRAMUSCULAR | Status: DC
  Filled 2012-05-30: qty 3

## 2012-05-30 MED ORDER — ACETAMINOPHEN 650 MG RE SUPP: 650.0000 mg | Freq: Four times a day (QID) | RECTAL | Status: DC | PRN

## 2012-05-30 MED ORDER — METRONIDAZOLE IN NACL 5-0.79 MG/ML-% IV SOLN
500.0000 mg | Freq: Three times a day (TID) | INTRAVENOUS | Status: DC
  Administered 2012-05-30 – 2012-06-04 (×14): 500 mg via INTRAVENOUS
  Filled 2012-05-30 (×16): qty 100

## 2012-05-30 MED ORDER — SODIUM CHLORIDE 0.9 % IV BOLUS (SEPSIS)
1000.0000 mL | Freq: Once | INTRAVENOUS | Status: AC
  Administered 2012-05-30: 1000 mL via INTRAVENOUS

## 2012-05-30 MED ORDER — SODIUM CHLORIDE 0.9 % IV SOLN: INTRAVENOUS | Status: AC

## 2012-05-30 MED ORDER — SODIUM CHLORIDE 0.9 % IV SOLN
INTRAVENOUS | Status: DC
  Administered 2012-05-30 – 2012-06-04 (×7): via INTRAVENOUS

## 2012-05-30 MED ORDER — ONDANSETRON HCL 4 MG/2ML IJ SOLN
4.0000 mg | Freq: Once | INTRAMUSCULAR | Status: AC
  Administered 2012-05-30: 4 mg via INTRAVENOUS
  Filled 2012-05-30: qty 2

## 2012-05-30 MED ORDER — ACETAMINOPHEN 325 MG PO TABS: 650.0000 mg | ORAL_TABLET | Freq: Four times a day (QID) | ORAL | Status: DC | PRN

## 2012-05-30 MED ORDER — IOHEXOL 300 MG/ML  SOLN
80.0000 mL | Freq: Once | INTRAMUSCULAR | Status: AC | PRN
  Administered 2012-05-30: 80 mL via INTRAVENOUS

## 2012-05-30 MED ORDER — FENTANYL CITRATE 0.05 MG/ML IJ SOLN: 100.0000 ug | INTRAMUSCULAR | Status: DC | PRN

## 2012-05-30 MED ORDER — SODIUM CHLORIDE 0.9 % IJ SOLN
3.0000 mL | Freq: Two times a day (BID) | INTRAMUSCULAR | Status: DC
  Administered 2012-05-30 – 2012-06-18 (×24): 3 mL via INTRAVENOUS

## 2012-05-30 MED ORDER — IOHEXOL 300 MG/ML  SOLN
25.0000 mL | INTRAMUSCULAR | Status: AC
Start: 2012-05-30 — End: 2012-05-30
  Administered 2012-05-30 (×2): 25 mL via ORAL

## 2012-05-30 MED ORDER — CIPROFLOXACIN IN D5W 400 MG/200ML IV SOLN
400.0000 mg | Freq: Two times a day (BID) | INTRAVENOUS | Status: DC
  Administered 2012-05-30 – 2012-06-03 (×9): 400 mg via INTRAVENOUS
  Filled 2012-05-30 (×10): qty 200

## 2012-05-30 MED ORDER — FENTANYL CITRATE 0.05 MG/ML IJ SOLN
25.0000 ug | INTRAMUSCULAR | Status: DC | PRN
  Administered 2012-05-30: 25 ug via INTRAVENOUS
  Filled 2012-05-30: qty 2

## 2012-05-30 MED ORDER — FENTANYL CITRATE 0.05 MG/ML IJ SOLN
100.0000 ug | Freq: Once | INTRAMUSCULAR | Status: AC
  Administered 2012-05-30: 100 ug via INTRAVENOUS
  Filled 2012-05-30: qty 2

## 2012-05-30 MED ORDER — ONDANSETRON HCL 4 MG PO TABS: 4.0000 mg | ORAL_TABLET | Freq: Four times a day (QID) | ORAL | Status: DC | PRN

## 2012-05-30 MED ORDER — FENTANYL CITRATE 0.05 MG/ML IJ SOLN
50.0000 ug | Freq: Once | INTRAMUSCULAR | Status: AC
  Administered 2012-05-30: 50 ug via INTRAVENOUS
  Filled 2012-05-30: qty 2

## 2012-05-30 MED ORDER — PANTOPRAZOLE SODIUM 40 MG IV SOLR
40.0000 mg | Freq: Once | INTRAVENOUS | Status: AC
  Administered 2012-05-30: 40 mg via INTRAVENOUS
  Filled 2012-05-30: qty 40

## 2012-05-30 NOTE — Consult Note (Signed)
CARDIOLOGY CONSULT NOTE  Patient ID: [name] [name] MRN: [id number] DOB/AGE: Jun 29, 1930 79 y.o.  Admit date: 05/30/2012 Primary Physician Miguel Aschoff, MD Primary Cardiologist CM Chief Complaint  afib/RVR  HPI: Mr [name] [name] is a 79 year old male with a history of atrial fibrillation. He has noted a distended abdomen and diffuse abdominal pain x 2 days. He has had no chest pain, SOB or palpitations. He has not had presyncope/syncope. Last pm, slept poorly and when up to the bathroom early this am, he had a dizzy feeling and then a syncopal episode. He slid down the wall and did not get injured. He had diarrhea. His wife called 911 and he was transported to [facility]. En route, he developed mild palpitations. No chest pain or SOB. He continues to feel dizzy and weak. He has abdominal pain. He has also had dark emesis in the ER. He does not currently have palpitations (HR 120s) but does not usually know when he is in atrial fibrillation. Of note, he has not had his normal am dose of Cardizem, but had all medications yesterday, including Pradaxa   Past Medical History  Diagnosis Date  . Ramsay Hunt auricular syndrome   . HTN (hypertension)   . Osteoarthritis   . Dysrhythmia 04/17/12  . Anxiety   . Stroke   . H/O Bell's palsy at age 30  . Atrial flutter   . Atrial fibrillation     Past Surgical History  Procedure Laterality Date  . Hernia repair    . Vasectomy    . Prostate biopsies      per Vonita Moss  . Cataract extraction      SE eye    Allergies  Allergen Reactions  . Lactose Intolerance (Gi)   . Penicillins Rash    Rash appeared on palms of hands and feet.   Medication Sig  b complex vitamins capsule Take 1 capsule by mouth daily.  Cholecalciferol (VITAMIN D-3 PO) Take 1 tablet by mouth daily.   dabigatran (PRADAXA) 150 MG CAPS Take 1 capsule (150 mg total) by mouth every 12  hours.  diltiazem (CARDIZEM CD) 120 MG 24 hr capsule Take 120 mg by mouth every morning.   metoCLOPramide  (REGLAN) 5 MG tablet Take 5 mg by mouth 3 (three) times daily before meals.   Multiple Vitamins-Minerals (PRESERVISION ) Take 1 tablet by mouth 2 (two) times daily.  Omega-3 Fatty Acids (FISH OIL) 1000 MG CAPS Take 1 capsule by mouth daily.   omeprazole (PRILOSEC) 20 MG capsule Take 1 capsule by mouth 2 (two) times daily.   rosuvastatin (CRESTOR) 10 MG tablet Take 10 mg by mouth daily.  silodosin (RAPAFLO) 8 MG CAPS capsule Take 8 mg by mouth daily with breakfast.  sucralfate (CARAFATE) 1 G tablet Take 1 g by mouth 3 (three) times daily before meals.    Family History  Problem Relation Age of Onset  . Cancer Father   . Diabetes Father     History   Social History  . Marital Status: Married    Spouse Name: [name] [name]    Number of Children: 2  . Years of Education: N/A   Occupational History  . 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   Social History Main Topics  . Smoking status: Former Smoker    Quit date: 06/22/1984  . Smokeless tobacco: Never Used  . Alcohol Use: 3.5 oz/week    7 drink(s) per week     Comment: 1 glass of wine a day  .  Drug Use: No  . Sexually Active: Not on file   Other Topics Concern  . Not on file   Social History Narrative   Health Care POA: wife, [name] [name]   Emergency Contact: [name] [name], (c) [phone]   End of Life Plan: DNR   Who lives with you: Lives with wife, [name] [name]   Any pets: coon hound, Cookie   Diet: Patient has a varied diet of protein, starch, vegetables.   Exercise: Patient walks or does yoga 5 x a week.   Seatbelts: Patient reports wearing a seat belt when in vehicle.   Sun Exposure/Protection: Patient reports infrequent use of sun screen.   Hobbies: Walking, running, politics           ROS: As stated in the HPI and negative for all other systems.  Physical Exam: Blood pressure 102/61, pulse 123, temperature 97.3 F (36.3 C), temperature source Oral, resp. rate 26, height 6\' 1"  (1.854 m), weight 185 lb  (83.915 kg), SpO2 94.00%.  General: Well developed, well nourished, male who appears acutely uncomfortable Head: Eyes PERRLA, No xanthomas.   Normocephalic and atraumatic  Lungs: Clear bilaterally to auscultation. Heart: Irregularly irregular with S1 S2, without MRG.  Pulses are 2+ & equal all 4 extrem. No carotid bruit. No JVD.  Abdomen: Bowel sounds are present but decreased, abdomen firm, distended and diffusely tender without masses or  hernias noted. Msk: Normal strength and tone for age. Extremities: No clubbing, cyanosis or edema.    Skin:  No rashes or lesions noted. Neuro: Alert and oriented X 3. Psych:  Good affect, responds appropriately  Labs: Lab Results  Component Value Date   BUN 19 05/30/2012   Lab Results  Component Value Date   CREATININE 1.24 05/30/2012   Lab Results  Component Value Date   NA 138 05/30/2012   K 4.1 05/30/2012   CL 101 05/30/2012   CO2 27 05/30/2012   GLUCOSE 162* 05/30/2012   Lab Results  Component Value Date   WBC 12.7* 05/30/2012   HGB 12.9* 05/30/2012   HCT 36.8* 05/30/2012   MCV 98.1 05/30/2012   PLT 317 05/30/2012     Radiology: No results found.  EKG: 20-May-2012 08:46:39  ATRIAL FIBRILLATION, V-RATE 74-153 ~ var'd rate, irreg atrial activity Vent. rate 107 BPM PR interval * ms QRS duration 84 ms QT/QTc 348/464 ms P-R-T axes * -4 22  ASSESSMENT AND PLAN: Active Problems:   Atrial fibrillation, rapid - pt has not had normal PO meds, will add IV Rx but will have to be careful as SBP on the low side. MD advise on amiodarone. May need volume resuscitation, which would help with rate control. Feel most of his heart rate is due to acute illness. Will follow with you.  Anticoagulation - PTA on Pradaxa, this will have to be held. MD to advise time frame to re-consider anticoag meds.  Otherwise, per IM/GI.  Signed: Theodore Demark 05/30/2012, 11:36 AM  History and all data above reviewed.  Patient examined.  I agree with the findings as  above.  The patient has had a complicated recent GI history.  He has a history of PAF and coronary calcification with negative stress testing in 2012.  He was on Pradaxa  He had worsening abdominal discomfort this am with diarrhea, N/V and coffee ground emesis.  He came to the ER and is noted to have atrial fib (was paroxysmal currently it is maintaining atrial fib with rate 120s).  He doesn't notice  this rhythm.  He has had a previous history of presyncope.  He has been limited lately by significant weakness and his chronic GI complaints.  We are asked to see for management of atrial fib. The patient exam reveals [id number]  ,  Lungs: Clear  ,  Abd: Distended with decreased bowel sounds, Ext No edema  .  All available labs, radiology testing, previous records reviewed. Agree with documented assessment and plan. Atrial fibrillation.  Pradaxa has been discontinued.  General surgery has evaluated the patient and GI has been consulted.  Rate control will be somewhat difficult as he is uncomfortable.  He is currently NPO.  I will start a low dose of IV diltiazem.      Fayrene Fearing Jahel Wavra  2:57 PM  05/30/2012

## 2012-05-30 NOTE — ED Notes (Signed)
Pt vomiting x1 with dark coffee ground emesis. ED-P notified. Orders given to call CT and go ahead without pt finishing oral contrast. CT called

## 2012-05-30 NOTE — ED Provider Notes (Signed)
History     CSN: [id number]  Arrival date & time 05/30/12  4098   First MD Initiated Contact with Patient 05/30/12 0848      Chief Complaint  Patient presents with  . Abdominal Pain  . Hypotension    (Consider location/radiation/quality/duration/timing/severity/associated sxs/prior treatment) HPI Comments: [name] [name] is a 79 y.o. male with a history of chronic atrial fibrillation, stroke (currently on Pradaxa) and hypertension that presents emergency department status post syncopal episode.  Event occurred just prior to arrival and was described by a precipitating lightheadedness, clamminess, and dizziness.  Patient denies falling and hitting his head.  Event was witnessed and described as a sliding down the wall.  At time of the event patient had bowel incontinence. In addition patient reports abdominal pain.  Onset of abdominal pain was 2 days ago.  He has a history of gastroparesis and this does feel similar.  Pain is located diffusely with a distended abdomen described as a cramping sensation that is rated at 7/10 in severity without radiation.  No known alleviating or exacerbating factors.  Associated symptoms include intermittent diarrhea times one month.  Patient's hemoglobin per wife has dropped from 12 to10 since January.  He was started on iron pills by his primary care provider but no other workup has been done at this time.  Patient denies any increased bruising, nosebleeds, chest pain, shortness of breath, head trauma, change in vision, unilateral weakness or disequilibrium.  Cardiology: [facility]- Dr. Clifton James GI- Deboraha Sprang, Dr. Madilyn Fireman, [facility] Dr. Alycia Rossetti- dx w biopsy proven gastritis  PCP- Dr. Miguel Aschoff    Patient is a 79 y.o. male presenting with abdominal pain. The history is provided by the patient, medical records and a relative.  Abdominal Pain Associated symptoms: diarrhea and nausea   Associated symptoms: no chest pain, no chills, no constipation, no dysuria, no  fever, no shortness of breath and no vomiting     Past Medical History  Diagnosis Date  . Ramsay Hunt auricular syndrome   . HTN (hypertension)   . Osteoarthritis   . Dysrhythmia 04/17/12  . Anxiety   . Stroke   . H/O Bell's palsy at age 80  . Atrial flutter   . Atrial fibrillation     Past Surgical History  Procedure Laterality Date  . Hernia repair    . Vasectomy    . Prostate biopsies      per Vonita Moss  . Cataract extraction      SE eye    Family History  Problem Relation Age of Onset  . Cancer Father   . Diabetes Father     History  Substance Use Topics  . Smoking status: Former Smoker    Quit date: 06/22/1984  . Smokeless tobacco: Never Used  . Alcohol Use: 3.5 oz/week    7 drink(s) per week     Comment: 1 glass of wine a day      Review of Systems  Constitutional: Negative for fever, chills and appetite change.  HENT: Negative for congestion, neck pain and neck stiffness.   Eyes: Negative for visual disturbance.  Respiratory: Negative for shortness of breath.   Cardiovascular: Negative for chest pain and leg swelling.  Gastrointestinal: Positive for nausea, abdominal pain, diarrhea and abdominal distention. Negative for vomiting, constipation, blood in stool, anal bleeding and rectal pain.  Genitourinary: Negative for dysuria, urgency and frequency.  Skin: Positive for pallor.  Neurological: Positive for syncope and light-headedness. Negative for weakness, numbness and headaches.  Psychiatric/Behavioral: Negative for confusion.  All other systems reviewed and are negative.    Allergies  Lactose intolerance (gi) and Penicillins  Home Medications   Current Outpatient Rx  Name  Route  Sig  Dispense  Refill  . b complex vitamins capsule   Oral   Take 1 capsule by mouth daily.         . Cholecalciferol (VITAMIN D-3 PO)   Oral   Take 1 tablet by mouth daily.          . dabigatran (PRADAXA) 150 MG CAPS   Oral   Take 1 capsule (150 mg  total) by mouth every 12 (twelve) hours.   60 capsule   2   . diltiazem (CARDIZEM CD) 120 MG 24 hr capsule   Oral   Take 120 mg by mouth every morning.          . metoCLOPramide (REGLAN) 5 MG tablet   Oral   Take 5 mg by mouth 3 (three) times daily before meals.          . Multiple Vitamins-Minerals (PRESERVISION AREDS PO)   Oral   Take 1 tablet by mouth 2 (two) times daily.         . Omega-3 Fatty Acids (FISH OIL) 1000 MG CAPS   Oral   Take 1 capsule by mouth daily.          Marland Kitchen omeprazole (PRILOSEC) 20 MG capsule   Oral   Take 1 capsule by mouth 2 (two) times daily.          . rosuvastatin (CRESTOR) 10 MG tablet   Oral   Take 10 mg by mouth daily.         . silodosin (RAPAFLO) 8 MG CAPS capsule   Oral   Take 8 mg by mouth daily with breakfast.         . sucralfate (CARAFATE) 1 G tablet   Oral   Take 1 g by mouth 3 (three) times daily before meals.            BP 100/75  Pulse 123  Temp(Src) 97.8 F (36.6 C) (Oral)  Resp 20  Ht 6\' 1"  (1.854 m)  Wt 185 lb (83.915 kg)  BMI 24.41 kg/m2  SpO2 96%  Physical Exam  Nursing note and vitals reviewed. Constitutional: He is oriented to person, place, and time. He appears well-developed and well-nourished. No distress.  HENT:  Head: Normocephalic and atraumatic.  Eyes: Conjunctivae and EOM are normal.  Neck: Normal range of motion.  No carotid bruits.  No JVD.  Normal range of motion  Cardiovascular:  Irregularly irregular rhythm with rate changing in and out of normal sinus rhythm from heart rate of 109-142  Pulmonary/Chest: Effort normal.  Lungs clear to auscultation bilaterally, no acute respiratory distress.  No chest wall tenderness  Abdominal:  Hyperactive bowel sounds.  Distended abdomen.  Diffuse tenderness to palpation without localization.  No obvious pulsatile aorta.  Genitourinary: Guaiac positive stool.  Spoiled clothes from bowel incontinence   Musculoskeletal: Normal range of motion.   Neurological: He is alert and oriented to person, place, and time.  Skin: Skin is warm and dry. No rash noted. He is not diaphoretic.  Psychiatric: He has a normal mood and affect. His behavior is normal.    ED Course  Procedures (including critical care time)  2 episodes of coffee ground emesis while trying to PO contrast. IV protonix initiated and radiology called to scan w IV contrast  only.   Labs Reviewed  CBC WITH DIFFERENTIAL - Abnormal; Notable for the following:    WBC 12.7 (*)    RBC 3.75 (*)    Hemoglobin 12.9 (*)    HCT 36.8 (*)    MCH 34.4 (*)    Neutrophils Relative 88 (*)    Neutro Abs 11.2 (*)    Lymphocytes Relative 6 (*)    All other components within normal limits  BASIC METABOLIC PANEL - Abnormal; Notable for the following:    Glucose, Bld 162 (*)    GFR calc non Af Amer 54 (*)    GFR calc Af Amer 62 (*)    All other components within normal limits  OCCULT BLOOD, POC DEVICE - Abnormal; Notable for the following:    Fecal Occult Bld POSITIVE (*)    All other components within normal limits  URINE CULTURE  URINALYSIS, ROUTINE W REFLEX MICROSCOPIC  TYPE AND SCREEN  ABO/RH   Ct Abdomen Pelvis W Contrast  05/30/2012  *RADIOLOGY REPORT*  Clinical Data: Near-syncope.  Abdominal pain with bloating and diarrhea.  CT ABDOMEN AND PELVIS WITH CONTRAST  Technique:  Multidetector CT imaging of the abdomen and pelvis was performed following the standard protocol during bolus administration of intravenous contrast.  Contrast: 80mL OMNIPAQUE IOHEXOL 300 MG/ML  SOLN  Comparison: None.  Findings: Lung bases show dependent atelectasis bilaterally.  Heart size normal.  No pericardial effusion.  There may be trace left pleural fluid.  Distal esophagus is dilated and fluid-filled.  Intrahepatic biliary duct dilatation.  Extrahepatic bile duct is somewhat difficult to follow but is likely within normal limits. Intermediate attenuation material layers in the gallbladder.  Right kidney  is unremarkable.  Low attenuation lesions in the left kidney measure up to 1.5 cm and are likely cysts.  Spleen, pancreas, stomach and duodenum are unremarkable.  There is rather diffuse distention of fluid-filled small bowel, without a discrete transition point.  The cecum and proximal ascending colon are fluid filled and dilated as well.  However, there is a fairly long segment of under distention of the colon with noticeable wall thickening involving the distal ascending, transverse and proximal descending portions.  The distal descending and rectosigmoid colon are minimally dilated and contains fluid.  Moderate ascites.  Mesenteric edema.  No definite pathologically enlarged lymph nodes.  Atherosclerotic calcification of the arterial vasculature.  Prostate is enlarged.  No worrisome lytic or sclerotic lesions.  IMPRESSION:  1.  Long segment of nondistended and thickened colon, involving the ascending, transverse and descending portions, with diffuse fluid filled and dilated small bowel.  Ischemic and infectious/inflammatory etiologies involving the colon are considered, with resultant mechanical small bowel obstruction. Critical Value/emergent results were called by telephone at the time of interpretation on 05/30/2012 at 1225 hours to Dr. Radford Pax, who verbally acknowledged these results. 2.  Tiny left pleural effusion and moderate ascites. 3.  Intrahepatic biliary duct dilatation, of uncertain etiology. 4.  Question gallbladder sludge. 5.  Prostate enlargement.   Original Report Authenticated By: Leanna Battles, M.D.      1. GI bleed   2. Atrial fibrillation   3. Heme positive stool   4. Coffee ground emesis   5. Syncope   6. SBO (small bowel obstruction)    CRITICAL CARE Performed by: Jaci Carrel   Total critical care time: 30  Critical care time was exclusive of separately billable procedures and treating other patients.  Critical care was necessary to treat or prevent imminent or  life-threatening  deterioration.  Critical care was time spent personally by me on the following activities: development of treatment plan with patient and/or surrogate as well as nursing, discussions with consultants, evaluation of patient's response to treatment, examination of patient, obtaining history from patient or surrogate, ordering and performing treatments and interventions, ordering and review of laboratory studies, ordering and review of radiographic studies, pulse oximetry and re-evaluation of patient's condition.   Consult cardiology for Afib, [facility] cardiology, Dr. Antoine Poche  to see in the ED Consult GI Dr. Randa Evens, Protonix & NG tube started. Discussed previous studies performed at [facility] by Dr. Alycia Rossetti.  MDM  79 year old male with a history of chronic atrial fibrillation and stroke currently on Pradaxa, biopsy proven gastritis and hypertension presents emergency department complaining of abdominal pain, abdominal distention and syncopal episode.  Labs and imaging reviewed.  Appropriate consultations made as above.  Attending involved in the case early on and agreeable with treatment and disposition plan.  Patient is to be admitted to step down unit.  Pain currently being managed with fentanyl do to hypotension.  IV fluids have been given 1 L bolus and infusion at rate of 75. The patient appears reasonably stabilized for admission considering the current resources, flow, and capabilities available in the ED at this time, and I doubt any other [facility] requiring further screening and/or treatment in the ED prior to admission.         Jaci Carrel, [place] 05/30/12 1243

## 2012-05-30 NOTE — ED Notes (Signed)
Pt BIB EMS after near syncopal event at home. Pt c/o abd pain/bloating/diarrhea with hx of gastroparesis. SBP on EMS arrival 80's. Recent hx of a-fib. Pt sees [facility]. Abd pain x2 days with severe increase at 0030 today.

## 2012-05-30 NOTE — ED Notes (Signed)
Cardiologist returned call and informed of troponin.

## 2012-05-30 NOTE — Consult Note (Signed)
I have seen and examined the pt and agree with PA-Osborne's note. Questionable UGIB, but c/w coffee ground emesis.  Pt also with melanotic stools.   CT shows likely ileus and either thickened proximal colon either ischemic or infectious cause. Pt also with a h/o strokes and anti-thrombin Rxs.  Currently in Afib with RVR  No emergent surgical plans at this time, and recommend GI consult for EGD and possible c-scope. Will con't to follow

## 2012-05-30 NOTE — Consult Note (Signed)
[name] [name] 12/15/1930  [id number].   Primary Care MD: C. Duane Lope Requesting MD: Dr. Thad Ranger Chief Complaint/Reason for Consult: SBO HPI: This is a 79 yo male who has recently had 2 CVAs within the last year and a half, a. Fib, and HTN who has been having nausea for the last several weeks.  He apparently was diagnosed with gastritis several weeks ago after an endoscopy.  He was referred to [name] to see [name] for further workup.  At some point in the last several weeks, it was thought he may have gastroparesis as the cause of his nausea.  He was supposed to get another endoscopy this past Monday but it was cancelled.  Since then, he has developed abdominal pain and distention.  He continues to pass gas and have intermittent diarrhea.  He started having coffee ground emesis this morning and had a syncopal episode.  He was brought to [facility] where he had a CT scan that reveals thickened colon possible ischemia vs infectious/inflammatory with a likely reactive small bowel ileus.  He also has diffuse ascites.  We have been asked to see him for further evaluation.  While in there the patient had a melanotic stool.  Review of systems: Please see HPI, otherwise negative.  No SOB, CP, just feels weak  Family History  Problem Relation Age of Onset  . Cancer Father   . Diabetes Father     Past Medical History  Diagnosis Date  . Ramsay Hunt auricular syndrome   . HTN (hypertension)   . Osteoarthritis   . Dysrhythmia 04/17/12  . Anxiety   . Stroke   . H/O Bell's palsy at age 49  . Atrial flutter   . Atrial fibrillation     Past Surgical History  Procedure Laterality Date  . Hernia repair    . Vasectomy    . Prostate biopsies      per Vonita Moss  . Cataract extraction      SE eye    Social History:  reports that he quit smoking about 27 years ago. He has never used smokeless tobacco. He reports that he drinks about 3.5 ounces of alcohol per week. He reports that he does not use  illicit drugs.  Allergies:  Allergies  Allergen Reactions  . Lactose Intolerance (Gi)   . Penicillins Rash    Rash appeared on palms of hands and feet.     (Not in a hospital admission)  Blood pressure 111/78, pulse 123, temperature 97.8 F (36.6 C), temperature source Oral, resp. rate 20, height 6\' 1"  (1.854 m), weight 185 lb (83.915 kg), SpO2 96.00%. Physical Exam: General: pleasant, ill-appearing white male who is laying in bed in mild distress secondary to pain HEENT: head is normocephalic, atraumatic.  Sclera are noninjected.  PERRL.  Ears and nose without any masses or lesions.  Mouth is pink and moist Heart: irregularly, irregular, tachycardic.  Normal s1,s2. No obvious murmurs, gallops, or rubs noted.  Palpable radial and pedal pulses bilaterally Lungs: CTAB, no wheezes, rhonchi, or rales noted.  Respiratory effort nonlabored Abd: soft, but distended, hypoactive BS, diffusely tender, no rebounding, but some guarding is present, tympanetic.  No hernias, masses present MS: all 4 extremities are symmetrical with no cyanosis, clubbing, or edema. Skin: warm and dry with no masses, lesions, or rashes Psych: A&Ox3 with an appropriate affect.    Results for orders placed during the hospital encounter of 05/30/12 (from the past 48 hour(s))  OCCULT BLOOD, POC DEVICE  Status: Abnormal   Collection Time    05/30/12  9:13 AM      Result Value Range   Fecal Occult Bld POSITIVE (*) NEGATIVE  TYPE AND SCREEN     Status: None   Collection Time    05/30/12  9:26 AM      Result Value Range   ABO/RH(D) A POS     Antibody Screen NEG     Sample Expiration 06/02/2012    CBC WITH DIFFERENTIAL     Status: Abnormal   Collection Time    05/30/12  9:26 AM      Result Value Range   WBC 12.7 (*) 4.0 - 10.5 K/uL   RBC 3.75 (*) 4.22 - 5.81 MIL/uL   Hemoglobin 12.9 (*) 13.0 - 17.0 g/dL   HCT 45.4 (*) 09.8 - 11.9 %   MCV 98.1  78.0 - 100.0 fL   MCH 34.4 (*) 26.0 - 34.0 pg   MCHC 35.1  30.0  - 36.0 g/dL   RDW 14.7  82.9 - 56.2 %   Platelets 317  150 - 400 K/uL   Neutrophils Relative 88 (*) 43 - 77 %   Neutro Abs 11.2 (*) 1.7 - 7.7 K/uL   Lymphocytes Relative 6 (*) 12 - 46 %   Lymphs Abs 0.7  0.7 - 4.0 K/uL   Monocytes Relative 6  3 - 12 %   Monocytes Absolute 0.8  0.1 - 1.0 K/uL   Eosinophils Relative 0  0 - 5 %   Eosinophils Absolute 0.0  0.0 - 0.7 K/uL   Basophils Relative 0  0 - 1 %   Basophils Absolute 0.0  0.0 - 0.1 K/uL  BASIC METABOLIC PANEL     Status: Abnormal   Collection Time    05/30/12  9:26 AM      Result Value Range   Sodium 138  135 - 145 mEq/L   Potassium 4.1  3.5 - 5.1 mEq/L   Chloride 101  96 - 112 mEq/L   CO2 27  19 - 32 mEq/L   Glucose, Bld 162 (*) 70 - 99 mg/dL   BUN 19  6 - 23 mg/dL   Creatinine, Ser 1.30  0.50 - 1.35 mg/dL   Calcium 9.6  8.4 - 86.5 mg/dL   GFR calc non Af Amer 54 (*) >90 mL/min   GFR calc Af Amer 62 (*) >90 mL/min   Comment:            The eGFR has been calculated     using the CKD EPI equation.     This calculation has not been     validated in all clinical     situations.     eGFR's persistently     <90 mL/min signify     possible Chronic Kidney Disease.  ABO/RH     Status: None   Collection Time    05/30/12  9:26 AM      Result Value Range   ABO/RH(D) A POS     Ct Abdomen Pelvis W Contrast  05/30/2012  *RADIOLOGY REPORT*  Clinical Data: Near-syncope.  Abdominal pain with bloating and diarrhea.  CT ABDOMEN AND PELVIS WITH CONTRAST  Technique:  Multidetector CT imaging of the abdomen and pelvis was performed following the standard protocol during bolus administration of intravenous contrast.  Contrast: 80mL OMNIPAQUE IOHEXOL 300 MG/ML  SOLN  Comparison: None.  Findings: Lung bases show dependent atelectasis bilaterally.  Heart size normal.  No pericardial effusion.  There may be trace left pleural fluid.  Distal esophagus is dilated and fluid-filled.  Intrahepatic biliary duct dilatation.  Extrahepatic bile duct is  somewhat difficult to follow but is likely within normal limits. Intermediate attenuation material layers in the gallbladder.  Right kidney is unremarkable.  Low attenuation lesions in the left kidney measure up to 1.5 cm and are likely cysts.  Spleen, pancreas, stomach and duodenum are unremarkable.  There is rather diffuse distention of fluid-filled small bowel, without a discrete transition point.  The cecum and proximal ascending colon are fluid filled and dilated as well.  However, there is a fairly long segment of under distention of the colon with noticeable wall thickening involving the distal ascending, transverse and proximal descending portions.  The distal descending and rectosigmoid colon are minimally dilated and contains fluid.  Moderate ascites.  Mesenteric edema.  No definite pathologically enlarged lymph nodes.  Atherosclerotic calcification of the arterial vasculature.  Prostate is enlarged.  No worrisome lytic or sclerotic lesions.  IMPRESSION:  1.  Long segment of nondistended and thickened colon, involving the ascending, transverse and descending portions, with diffuse fluid filled and dilated small bowel.  Ischemic and infectious/inflammatory etiologies involving the colon are considered, with resultant mechanical small bowel obstruction. Critical Value/emergent results were called by telephone at the time of interpretation on 05/30/2012 at 1225 hours to Dr. Radford Pax, who verbally acknowledged these results. 2.  Tiny left pleural effusion and moderate ascites. 3.  Intrahepatic biliary duct dilatation, of uncertain etiology. 4.  Question gallbladder sludge. 5.  Prostate enlargement.   Original Report Authenticated By: Leanna Battles, M.D.        Assessment/Plan 1. A. Fib with RVR 2. H/o CVA x2 3. Colitis, infectious vs ischemic 4. Reactive ileus secondary to number 3 5. GI bleed with coffee ground emesis and melanotic stools 6. HTN  Plan: 1. The patient needs resuscitated and heart  rate better controlled.  Given the multitude of things going on in his belly, first GI needs to be involved to rule out a significant GI bleed.  Unsure whether melena may be secondary to ischemic colon, although, that would not likely cause his emesis, but possibly his melenotic stools.  Will have this evaluated first.  We agree with NGT placement.  Ultimately, the patient may need some type of surgical intervention, but we will hold off for now for further workup.  We will follow along.  OSBORNE,KELLY E 05/30/2012, 1:42 PM Pager: [phone]

## 2012-05-30 NOTE — ED Notes (Signed)
Admitting MD returned phone call and updated on pt status. Pt not appropriate for step-down and ccu bed ordered per MD.

## 2012-05-30 NOTE — Consult Note (Signed)
[facility] Reason for consult: Hematemesis Referring Physician: [facility]. PCP: Dr. Tenny Craw primary G.I.: Dr. Yvonne Kendall is an 79 y.o. male.  HPI: patient has known cardiovascular disease with atherosclerosis and his coronary arteries and mesenteric vessels on scans. He has followed up with Dr. Ladona Ridgel of low-power cardiology. She was hospitalized in [facility] 1/14 for CVA. He apparently had complete recovery and has been on Pradaxa. He was subsequently committed to [facility] 1/14 with transient speech deficits questionable TIA. These apparently resolved. He has a history of gastroparesis diagnosed in the past by Dr. Madilyn Fireman. He's been referred to [facility] and seen Dr.Koch who is an expert in gastroparesis. The patient has a multitude of test ordered in the future. He has had heme positive stool, dropping hemoglobin and hemoccult positive stool. He saw Dr. Madilyn Fireman with this issue about 2 weeks ago and in was scheduled. After discussion with the patient's neurologist, the EGD was canceled because it was not felt safe to stop the Pradaxa. Patient has had EGD 10/13 for nausea and vomiting, postprandial abdominal pain, and weight loss. This revealed severe gastritis and throughout the entire stomach confirmed by biopsy. The patient was started carafate. I often the patient complains of chronic postprandial abdominal pain, weight loss and 30 pounds over the past 6 months. For the past week as symptoms of been getting much worse and he's been having diffuse cramping abdominal pain, distended abdomen and intermittent diarrhea. He's been unable to keep down a lot of food. The patient was feeling weak, dizzy, lightheaded and may have had a pre-syncopal episode. He presented to the emergency room and rapid AFib with rapid ventricular response in heart rate 130s. She was hypotensive and had to episodes of coffee ground emesis. Stools were positive for FOB. Labs remarkable for  hemoglobin 12.9 WBC of 12.7.  CT scan showed intrahepatic ductal dilatation of the minor nature and diffuse fluid filled small bowel. Cecum proximal ascending colon also appeared dilated and fluid filled. There was a long segment of transfers, descending colon that were thickened with decompressed distal colon. Patient gives me the history that he has had postprandial nausea abdominal pain with diffuse cramping after every meal now for several weeks. This is been attributed to the gastroparesis. He has lost approximately 30 pounds over the past several months.   Past Medical History  Diagnosis Date  . Ramsay Hunt auricular syndrome   . HTN (hypertension)   . Osteoarthritis   . Dysrhythmia 04/17/12  . Anxiety   . Stroke   . H/O Bell's palsy at age 48  . Atrial flutter   . Atrial fibrillation     Past Surgical History  Procedure Laterality Date  . Hernia repair    . Vasectomy    . Prostate biopsies      per Vonita Moss  . Cataract extraction      SE eye    Family History  Problem Relation Age of Onset  . Cancer Father   . Diabetes Father     Social History:  reports that he quit smoking about 27 years ago. He has never used smokeless tobacco. He reports that he drinks about 3.5 ounces of alcohol per week. He reports that he does not use illicit drugs.  Allergies:  Allergies  Allergen Reactions  . Lactose Intolerance (Gi)   . Penicillins Rash    Rash appeared on palms of hands and feet.    Medications;   PRN Meds  Results for orders placed during the hospital encounter of 05/30/12 (from the past 48 hour(s))  OCCULT BLOOD, POC DEVICE     Status: Abnormal   Collection Time    05/30/12  9:13 AM      Result Value Range   Fecal Occult Bld POSITIVE (*) NEGATIVE  TYPE AND SCREEN     Status: None   Collection Time    05/30/12  9:26 AM      Result Value Range   ABO/RH(D) A POS     Antibody Screen NEG     Sample Expiration 06/02/2012    CBC WITH DIFFERENTIAL     Status:  Abnormal   Collection Time    05/30/12  9:26 AM      Result Value Range   WBC 12.7 (*) 4.0 - 10.5 K/uL   RBC 3.75 (*) 4.22 - 5.81 MIL/uL   Hemoglobin 12.9 (*) 13.0 - 17.0 g/dL   HCT 16.1 (*) 09.6 - 04.5 %   MCV 98.1  78.0 - 100.0 fL   MCH 34.4 (*) 26.0 - 34.0 pg   MCHC 35.1  30.0 - 36.0 g/dL   RDW 40.9  81.1 - 91.4 %   Platelets 317  150 - 400 K/uL   Neutrophils Relative 88 (*) 43 - 77 %   Neutro Abs 11.2 (*) 1.7 - 7.7 K/uL   Lymphocytes Relative 6 (*) 12 - 46 %   Lymphs Abs 0.7  0.7 - 4.0 K/uL   Monocytes Relative 6  3 - 12 %   Monocytes Absolute 0.8  0.1 - 1.0 K/uL   Eosinophils Relative 0  0 - 5 %   Eosinophils Absolute 0.0  0.0 - 0.7 K/uL   Basophils Relative 0  0 - 1 %   Basophils Absolute 0.0  0.0 - 0.1 K/uL  BASIC METABOLIC PANEL     Status: Abnormal   Collection Time    05/30/12  9:26 AM      Result Value Range   Sodium 138  135 - 145 mEq/L   Potassium 4.1  3.5 - 5.1 mEq/L   Chloride 101  96 - 112 mEq/L   CO2 27  19 - 32 mEq/L   Glucose, Bld 162 (*) 70 - 99 mg/dL   BUN 19  6 - 23 mg/dL   Creatinine, Ser 7.82  0.50 - 1.35 mg/dL   Calcium 9.6  8.4 - 95.6 mg/dL   GFR calc non Af Amer 54 (*) >90 mL/min   GFR calc Af Amer 62 (*) >90 mL/min   Comment:            The eGFR has been calculated     using the CKD EPI equation.     This calculation has not been     validated in all clinical     situations.     eGFR's persistently     <90 mL/min signify     possible Chronic Kidney Disease.  ABO/RH     Status: None   Collection Time    05/30/12  9:26 AM      Result Value Range   ABO/RH(D) A POS      Ct Abdomen Pelvis W Contrast  05/30/2012  *RADIOLOGY REPORT*  Clinical Data: Near-syncope.  Abdominal pain with bloating and diarrhea.  CT ABDOMEN AND PELVIS WITH CONTRAST  Technique:  Multidetector CT imaging of the abdomen and pelvis was performed following the standard protocol during bolus administration of intravenous contrast.  Contrast: 80mL  OMNIPAQUE IOHEXOL 300  MG/ML  SOLN  Comparison: None.  Findings: Lung bases show dependent atelectasis bilaterally.  Heart size normal.  No pericardial effusion.  There may be trace left pleural fluid.  Distal esophagus is dilated and fluid-filled.  Intrahepatic biliary duct dilatation.  Extrahepatic bile duct is somewhat difficult to follow but is likely within normal limits. Intermediate attenuation material layers in the gallbladder.  Right kidney is unremarkable.  Low attenuation lesions in the left kidney measure up to 1.5 cm and are likely cysts.  Spleen, pancreas, stomach and duodenum are unremarkable.  There is rather diffuse distention of fluid-filled small bowel, without a discrete transition point.  The cecum and proximal ascending colon are fluid filled and dilated as well.  However, there is a fairly long segment of under distention of the colon with noticeable wall thickening involving the distal ascending, transverse and proximal descending portions.  The distal descending and rectosigmoid colon are minimally dilated and contains fluid.  Moderate ascites.  Mesenteric edema.  No definite pathologically enlarged lymph nodes.  Atherosclerotic calcification of the arterial vasculature.  Prostate is enlarged.  No worrisome lytic or sclerotic lesions.  IMPRESSION:  1.  Long segment of nondistended and thickened colon, involving the ascending, transverse and descending portions, with diffuse fluid filled and dilated small bowel.  Ischemic and infectious/inflammatory etiologies involving the colon are considered, with resultant mechanical small bowel obstruction. Critical Value/emergent results were called by telephone at the time of interpretation on 05/30/2012 at 1225 hours to Dr. Radford Pax, who verbally acknowledged these results. 2.  Tiny left pleural effusion and moderate ascites. 3.  Intrahepatic biliary duct dilatation, of uncertain etiology. 4.  Question gallbladder sludge. 5.  Prostate enlargement.   Original Report  Authenticated By: Leanna Battles, M.D.    ROS: please see HPI             Blood pressure 115/84, pulse 123, temperature 97.8 F (36.6 C), temperature source Oral, resp. rate 22, height 6\' 1"  (1.854 m), weight 83.915 kg (185 lb), SpO2 97.00%.  Physical exam:   General -- white male with coffee ground material in his mouth and on the sheet.   Lungs -- clear Heart -- tachycardia without gross murmurs Abdomen -- nondistended was greatly diminished bowel sounds and diffuse mild tenderness that is generally nonlocalized and maybe more left side.  Assessment: 1. Hematemesis. Patient has known severe gastritis and gastroparesis. He's been having vomiting and this could be due to a multitude of things. His hemoglobin is normal and I don't think he's having significant upper G.I. bleeding. He has been on Pradaxa which I'm sure has been contributing to his bleeding.   2. Abdominal pain/diarrhea/rectal bleeding. CT scan very suggestive of ischemic colitis with Mark thickening of colon and transverse and descending colon area with probable proximal colonic and small bowel obstruction. 3. AFib with rapid ventricular response probably contributing to hypotension. Cardiology following 4. Anti-coagulated on Pradaxa 5. Recent stroke/TIA 6. Chronic gastritis/gastroparesis 7. Chronic nausea or vomiting/postprandial abdominal pain/weight loss. Given his known vascular disease and probable ischemic colitis suspected he may have chronic mesenteric ischemia and will probably need evaluation of his mesenteric vessels at some point in the future  Plan: 1. Agree withholding Pradaxa now since he is actively bleeding   2. Would go ahead and try to decompresses G.I. track with NG suction. There is some risk to passing NG tube with recent anticoagulation but should diminish over time.  3. Broad-spectrum antibiotic or probable ischemic colitis 4.  Will obtain G.I. pathogen panel to evaluate for other possible  causes of lower G.I. bleeding, diarrhea, and colitis. 5. Will likely need sigmoidoscopy/colonoscopy at some point in the future.   EDWARDS JR,JAMES L 05/30/2012, 3:06 PM

## 2012-05-30 NOTE — H&P (Signed)
History and Physical       Hospital Admission Note Date: 05/30/2012  Patient name: [name] [name] Medical record number: [id number] Date of birth: 17-Feb-1931 Age: 79 y.o. Gender: male PCP: Miguel Aschoff, MD  Cardiology: Adolph Pollack- Dr. Clifton James  GI- Deboraha Sprang, Dr. Madilyn Fireman, [facility] Dr. Alycia Rossetti- dx w biopsy proven gastritis    Chief Complaint:  Coffee-ground emesis today x2 with abdominal distention worsening over last 2 days, syncopal episode today  HPI: Patient is a 79 year old male with multiple medical problems including chronic atrial fibrillation on pradaxa, stroke, hypertension, gastroparesis presented to ED with above complaints. History was obtained from the patient and his wife present in the room. Apparently patient's the symptoms started 2 days ago with abdominal pain, diffusely with cramping sensation, 7-8/10. Subsequently he noticed that his abdomen was getting distended. He also has been having intermittent diarrhea recently in last month and saw his gastroenterologist (Dr. Madilyn Fireman). Patient states that he was advised lactose-free diet but that did not improve the diarrhea. This morning patient was feeling dizzy and lightheaded and an episode of diarrhea. He had a syncopal episode, patient denied hitting his head and was witnessed by his wife. Per his wife, he was out for only a few seconds. When patient presented to the ED, he was noted to be in rapid A. Fib, HR in 130's, hypotensive BP in 80's, he was placed on IV fluids. Patient had 2 episodes of coffee-ground emesis in the ED. FOBT was positive in ED.  CT scan of the abdomen was obtained due to the abdominal distention and coffee-ground emesis which showed moderate ascites with diffuse fluid filled and dilated small bowel likely mechanical small bowel obstruction with ischemic/infectious and inflammatory etiologies.   Review of Systems:  Constitutional: Denies fever, chills,+   diaphoresis, appetite change and fatigue.  HEENT: Denies photophobia, eye pain, redness, hearing loss, ear pain, congestion, sore throat, rhinorrhea, sneezing, mouth sores, trouble swallowing, neck pain, neck stiffness and tinnitus.   Respiratory: Denies SOB, DOE, cough, chest tightness,  and wheezing.   Cardiovascular: Please see history of present illness Gastrointestinal:  Please see HPI Genitourinary: Denies dysuria, urgency, frequency, hematuria, flank pain and difficulty urinating.  Musculoskeletal: Denies myalgias, back pain, joint swelling, arthralgias and gait problem.  Skin: Denies pallor, rash and wound.  Neurological: Denies seizures, headaches.  see history of present illness Hematological: Denies adenopathy, easy bruising, personal or family bleeding history  Psychiatric/Behavioral: Denies suicidal ideation, mood changes, confusion, nervousness, sleep disturbance and agitation  Past Medical History: Past Medical History  Diagnosis Date  . Ramsay Hunt auricular syndrome   . HTN (hypertension)   . Osteoarthritis   . Dysrhythmia 04/17/12  . Anxiety   . Stroke   . H/O Bell's palsy at age 51  . Atrial flutter   . Atrial fibrillation    Past Surgical History  Procedure Laterality Date  . Hernia repair    . Vasectomy    . Prostate biopsies      per Vonita Moss  . Cataract extraction      SE eye    Medications: Prior to Admission medications   Medication Sig Start Date End Date Taking? Authorizing Provider  b complex vitamins capsule Take 1 capsule by mouth daily.   Yes Historical Provider, MD  Cholecalciferol (VITAMIN D-3 PO) Take 1 tablet by mouth daily.    Yes Historical Provider, MD  dabigatran (PRADAXA) 150 MG CAPS Take 1 capsule (150 mg total) by mouth every 12 (twelve) hours. 04/18/12  Yes  Meredeth Ide, MD  diltiazem (CARDIZEM CD) 120 MG 24 hr capsule Take 120 mg by mouth every morning.    Yes Historical Provider, MD  metoCLOPramide (REGLAN) 5 MG tablet Take 5 mg by  mouth 3 (three) times daily before meals.    Yes Historical Provider, MD  Multiple Vitamins-Minerals (PRESERVISION AREDS PO) Take 1 tablet by mouth 2 (two) times daily.   Yes Historical Provider, MD  Omega-3 Fatty Acids (FISH OIL) 1000 MG CAPS Take 1 capsule by mouth daily.    Yes Historical Provider, MD  omeprazole (PRILOSEC) 20 MG capsule Take 1 capsule by mouth 2 (two) times daily.  05/01/10  Yes Historical Provider, MD  rosuvastatin (CRESTOR) 10 MG tablet Take 10 mg by mouth daily.   Yes Historical Provider, MD  silodosin (RAPAFLO) 8 MG CAPS capsule Take 8 mg by mouth daily with breakfast.   Yes Historical Provider, MD  sucralfate (CARAFATE) 1 G tablet Take 1 g by mouth 3 (three) times daily before meals.    Yes Historical Provider, MD    Allergies:   Allergies  Allergen Reactions  . Lactose Intolerance (Gi)   . Penicillins Rash    Rash appeared on palms of hands and feet.    Social History:  reports that he quit smoking about 27 years ago. He has never used smokeless tobacco. He reports that he drinks about 3.5 ounces of alcohol per week. He reports that he does not use illicit drugs. patient lives at home with his wife and ambulates without any assistance  Family History: Family History  Problem Relation Age of Onset  . Cancer Father   . Diabetes Father     Physical Exam: Blood pressure 100/75, pulse 123, temperature 97.8 F (36.6 C), temperature source Oral, resp. rate 20, height 6\' 1"  (1.854 m), weight 83.915 kg (185 lb), SpO2 96.00%. General: Alert, awake, oriented x3, in no acute distress, feels uncomfortable. HEENT: normocephalic, atraumatic, anicteric sclera, pink conjunctiva, pupils equal and reactive to light and accomodation, oropharynx clear Neck: supple, no masses or lymphadenopathy, no goiter, no bruits  Heart: Irregularly irregular, no murmur rubs  Lungs: Clear to auscultation bilaterally, no wheezing, rales or rhonchi. Abdomen: Hypoactive bowel sounds, diffusely  tender, abdomen firm and distended, Extremities: No clubbing, cyanosis or edema with positive pedal pulses. Neuro: Grossly intact, no focal neurological deficits, strength 5/5 upper and lower extremities bilaterally Psych: alert and oriented x 3, normal mood and affect Skin: no rashes or lesions, warm and dry   LABS on Admission:  Basic Metabolic Panel:  Recent Labs Lab 05/30/12 0926  NA 138  K 4.1  CL 101  CO2 27  GLUCOSE 162*  BUN 19  CREATININE 1.24  CALCIUM 9.6  CBC:  Recent Labs Lab 05/30/12 0926  WBC 12.7*  NEUTROABS 11.2*  HGB 12.9*  HCT 36.8*  MCV 98.1  PLT 317     Radiological Exams on Admission: Ct Abdomen Pelvis W Contrast  05/30/2012  *RADIOLOGY REPORT*  Clinical Data: Near-syncope.  Abdominal pain with bloating and diarrhea.  CT ABDOMEN AND PELVIS WITH CONTRAST  Technique:  Multidetector CT imaging of the abdomen and pelvis was performed following the standard protocol during bolus administration of intravenous contrast.  Contrast: 80mL OMNIPAQUE IOHEXOL 300 MG/ML  SOLN  Comparison: None.  Findings: Lung bases show dependent atelectasis bilaterally.  Heart size normal.  No pericardial effusion.  There may be trace left pleural fluid.  Distal esophagus is dilated and fluid-filled.  Intrahepatic biliary duct dilatation.  Extrahepatic bile duct is somewhat difficult to follow but is likely within normal limits. Intermediate attenuation material layers in the gallbladder.  Right kidney is unremarkable.  Low attenuation lesions in the left kidney measure up to 1.5 cm and are likely cysts.  Spleen, pancreas, stomach and duodenum are unremarkable.  There is rather diffuse distention of fluid-filled small bowel, without a discrete transition point.  The cecum and proximal ascending colon are fluid filled and dilated as well.  However, there is a fairly long segment of under distention of the colon with noticeable wall thickening involving the distal ascending, transverse and  proximal descending portions.  The distal descending and rectosigmoid colon are minimally dilated and contains fluid.  Moderate ascites.  Mesenteric edema.  No definite pathologically enlarged lymph nodes.  Atherosclerotic calcification of the arterial vasculature.  Prostate is enlarged.  No worrisome lytic or sclerotic lesions.  IMPRESSION:  1.  Long segment of nondistended and thickened colon, involving the ascending, transverse and descending portions, with diffuse fluid filled and dilated small bowel.  Ischemic and infectious/inflammatory etiologies involving the colon are considered, with resultant mechanical small bowel obstruction. Critical Value/emergent results were called by telephone at the time of interpretation on 05/30/2012 at 1225 hours to Dr. Radford Pax, who verbally acknowledged these results. 2.  Tiny left pleural effusion and moderate ascites. 3.  Intrahepatic biliary duct dilatation, of uncertain etiology. 4.  Question gallbladder sludge. 5.  Prostate enlargement.   Original Report Authenticated By: Leanna Battles, M.D.    Dg Chest Port 1 View  05/10/2012  *RADIOLOGY REPORT*  Clinical Data: Low back pain  PORTABLE CHEST - 1 VIEW  Comparison: 04/17/2012  Findings: Cardiomediastinal silhouette is stable.  No acute infiltrate or pleural effusion.  No pulmonary edema.  Bony thorax is stable.  IMPRESSION: No active disease.  No significant change.   Original Report Authenticated By: Natasha Mead, M.D.     Assessment/Plan Principal Problem:   SBO (small bowel obstruction) with abdominal distention: - Admit to step down, stat lactic acid, n.p.o., IV fluids, NGT to intermittent wall suction - Surgery consult called  Active Problems:    Atrial fibrillation with RVR - Hold off on pradaxa due to GI bleed - Unfortunately cannot give BB or CCB due to hypotension, will likely need amiodarone, cardiology has already seen the patient in the ED, will follow the recommendations - Continue IV fluids for  volume resuscitation    GI bleed: Coffee-ground emesis with FOBT positive - Patient has a history of gastritis, placed on IV Protonix - Hold off Pradaxa - EDP has already discussed with [facility], keep NPO sec to SBO    Syncope: Likely due to all the above - Will obtain serial cardiac enzymes, cardiology following the patient - 2-D echo in 02/2011 showed EF of 55-60%, grade 1 diastolic dysfunction  History of CVA: Currently stable, hold off on pradaxa due to GI bleed. Patient understands the risk of CVA/TIA with rapid atrial fibrillation and holding anticoagulation.   DVT prophylaxis: SCD's  CODE STATUS: Full Code   Further plan will depend as patient's clinical course evolves and further radiologic and laboratory data become available.   Time Spent on Admission: 1 hour  RAI,RIPUDEEP M.D. [facility] 05/30/2012, 1:01 PM Pager: [phone]  If 7PM-7AM, please contact night-coverage [email or web address] Password [email or web address]

## 2012-05-30 NOTE — ED Provider Notes (Signed)
Medical screening examination/treatment/procedure(s) were conducted as a shared visit with non-physician practitioner(s) and myself.  I personally evaluated the patient during the encounter   CRITICAL CARE Performed by: Nelva Nay L   Total critical care time: 30 min  Critical care time was exclusive of separately billable procedures and treating other patients.  Critical care was necessary to treat or prevent imminent or life-threatening deterioration.  Critical care was time spent personally by me on the following activities: development of treatment plan with patient and/or surrogate as well as nursing, discussions with consultants, evaluation of patient's response to treatment, examination of patient, obtaining history from patient or surrogate, ordering and performing treatments and interventions, ordering and review of laboratory studies, ordering and review of radiographic studies, pulse oximetry and re-evaluation of patient's condition.   Nelia Shi, MD 05/30/12 [phone]

## 2012-05-30 NOTE — ED Notes (Signed)
Admitting MD paged 

## 2012-05-30 NOTE — ED Notes (Signed)
Dr Ramirez at the bedside.

## 2012-05-30 NOTE — ED Notes (Signed)
Dr.Rai notified of critical troponin 1623 Trish from [facility] cardiology returned page for critical troponin

## 2012-05-31 ENCOUNTER — Inpatient Hospital Stay (HOSPITAL_COMMUNITY): Payer: Medicare Other

## 2012-05-31 DIAGNOSIS — K920 Hematemesis: Secondary | ICD-10-CM

## 2012-05-31 DIAGNOSIS — K922 Gastrointestinal hemorrhage, unspecified: Secondary | ICD-10-CM

## 2012-05-31 DIAGNOSIS — R142 Eructation: Secondary | ICD-10-CM

## 2012-05-31 LAB — CBC
HCT: 33.5 % — ABNORMAL LOW (ref 39.0–52.0)
MCH: 33.4 pg (ref 26.0–34.0)
MCV: 97.4 fL (ref 78.0–100.0)
Platelets: 287 10*3/uL (ref 150–400)
RBC: 3.44 MIL/uL — ABNORMAL LOW (ref 4.22–5.81)
RDW: 13.1 % (ref 11.5–15.5)
WBC: 10.3 10*3/uL (ref 4.0–10.5)

## 2012-05-31 LAB — FECAL LACTOFERRIN, QUANT: Fecal Lactoferrin: NEGATIVE

## 2012-05-31 LAB — BASIC METABOLIC PANEL
CO2: 22 mEq/L (ref 19–32)
Calcium: 9.1 mg/dL (ref 8.4–10.5)
Chloride: 105 mEq/L (ref 96–112)
Creatinine, Ser: 1.27 mg/dL (ref 0.50–1.35)
Glucose, Bld: 122 mg/dL — ABNORMAL HIGH (ref 70–99)

## 2012-05-31 LAB — CK TOTAL AND CKMB (NOT AT ARMC)
CK, MB: 11.7 ng/mL (ref 0.3–4.0)
Total CK: 102 U/L (ref 7–232)

## 2012-05-31 LAB — TROPONIN I: Troponin I: 2.9 ng/mL (ref <0.30)

## 2012-05-31 MED ORDER — WHITE PETROLATUM GEL
Status: AC
  Administered 2012-05-31: 10:00:00
  Filled 2012-05-31: qty 5

## 2012-05-31 MED ORDER — POTASSIUM CHLORIDE CRYS ER 20 MEQ PO TBCR
20.0000 meq | EXTENDED_RELEASE_TABLET | ORAL | Status: DC
  Filled 2012-05-31: qty 1

## 2012-05-31 MED ORDER — DILTIAZEM HCL 100 MG IV SOLR
5.0000 mg/h | INTRAVENOUS | Status: DC
  Filled 2012-05-31: qty 100

## 2012-05-31 MED ORDER — POTASSIUM CHLORIDE 10 MEQ/100ML IV SOLN
10.0000 meq | INTRAVENOUS | Status: AC
  Administered 2012-05-31 (×2): 10 meq via INTRAVENOUS
  Filled 2012-05-31: qty 200

## 2012-05-31 NOTE — Progress Notes (Addendum)
Patient ID: [name] [name], male   DOB: 1930-07-11, 79 y.o.   MRN: [id number] Subjective:  No chest pain or sob. Abdominal pain improved.  Objective:  Vital Signs in the last 24 hours: Temp:  [97.2 F (36.2 C)-98.5 F (36.9 C)] 97.2 F (36.2 C) (03/14 1200) Pulse Rate:  [71-147] 83 (03/14 1600) Resp:  [15-28] 26 (03/14 1600) BP: (89-145)/(56-95) 112/81 mmHg (03/14 1600) SpO2:  [93 %-100 %] 100 % (03/14 1600) FiO2 (%):  [50 %] 50 % (03/13 2045) Weight:  [187 lb 2.7 oz (84.9 kg)] 187 lb 2.7 oz (84.9 kg) (03/13 2015)  Intake/Output from previous day: 03/13 0701 - 03/14 0700 In: 2150 [I.V.:1650; IV Piggyback:500] Out: 3265 [Urine:365; Emesis/NG output:600; Stool:1600] Intake/Output from this shift: Total I/O In: 1200 [I.V.:800; IV Piggyback:400] Out: 300 [Urine:300]  Physical Exam: Well appearing 79 yo man, NAD HEENT: Unremarkable Neck:  7 cm JVD, no thyromegally Lungs:  Clear with no wheezes, or rhonchi HEART:  Regular rate rhythm, no murmurs, no rubs, no clicks Abd:  soft, positive bowel sounds, no organomegally, no rebound, no guarding Ext:  2 plus pulses, no edema, no cyanosis, no clubbing Skin:  No rashes no nodules Neuro:  CN II through XII intact, motor grossly intact  Lab Results:  Recent Labs  05/30/12 0926 05/31/12 0210  WBC 12.7* 10.3  HGB 12.9* 11.5*  PLT 317 287    Recent Labs  05/30/12 0926 05/31/12 0210  NA 138 139  K 4.1 3.4*  CL 101 105  CO2 27 22  GLUCOSE 162* 122*  BUN 19 27*  CREATININE 1.24 1.27    Recent Labs  05/30/12 1941 05/31/12 0209  TROPONINI 0.58* 2.90*   Hepatic Function Panel No results found for this basename: PROT, ALBUMIN, AST, ALT, ALKPHOS, BILITOT, BILIDIR, IBILI,  in the last 72 hours No results found for this basename: CHOL,  in the last 72 hours No results found for this basename: PROTIME,  in the last 72 hours  Imaging: Ct Abdomen Pelvis W Contrast  05/30/2012  *RADIOLOGY REPORT*  Clinical Data:  Near-syncope.  Abdominal pain with bloating and diarrhea.  CT ABDOMEN AND PELVIS WITH CONTRAST  Technique:  Multidetector CT imaging of the abdomen and pelvis was performed following the standard protocol during bolus administration of intravenous contrast.  Contrast: 80mL OMNIPAQUE IOHEXOL 300 MG/ML  SOLN  Comparison: None.  Findings: Lung bases show dependent atelectasis bilaterally.  Heart size normal.  No pericardial effusion.  There may be trace left pleural fluid.  Distal esophagus is dilated and fluid-filled.  Intrahepatic biliary duct dilatation.  Extrahepatic bile duct is somewhat difficult to follow but is likely within normal limits. Intermediate attenuation material layers in the gallbladder.  Right kidney is unremarkable.  Low attenuation lesions in the left kidney measure up to 1.5 cm and are likely cysts.  Spleen, pancreas, stomach and duodenum are unremarkable.  There is rather diffuse distention of fluid-filled small bowel, without a discrete transition point.  The cecum and proximal ascending colon are fluid filled and dilated as well.  However, there is a fairly long segment of under distention of the colon with noticeable wall thickening involving the distal ascending, transverse and proximal descending portions.  The distal descending and rectosigmoid colon are minimally dilated and contains fluid.  Moderate ascites.  Mesenteric edema.  No definite pathologically enlarged lymph nodes.  Atherosclerotic calcification of the arterial vasculature.  Prostate is enlarged.  No worrisome lytic or sclerotic lesions.  IMPRESSION:  1.  Long segment of nondistended and thickened colon, involving the ascending, transverse and descending portions, with diffuse fluid filled and dilated small bowel.  Ischemic and infectious/inflammatory etiologies involving the colon are considered, with resultant mechanical small bowel obstruction. Critical Value/emergent results were called by telephone at the time of  interpretation on 05/30/2012 at 1225 hours to Dr. Radford Pax, who verbally acknowledged these results. 2.  Tiny left pleural effusion and moderate ascites. 3.  Intrahepatic biliary duct dilatation, of uncertain etiology. 4.  Question gallbladder sludge. 5.  Prostate enlargement.   Original Report Authenticated By: Leanna Battles, M.D.    Dg Chest Port 1 View  05/31/2012  *RADIOLOGY REPORT*  Clinical Data: Ascites.  Pleural effusion.  PORTABLE CHEST - 1 VIEW  Comparison: 05/10/2012.  Findings: Nasogastric tube gastric fundus level.  Bibasilar atelectasis.  Poor inspiration.  Limited evaluation lung bases.  Central pulmonary vascular prominence.  Tortuous aorta.  No gross pneumothorax.  Heart size top normal.  IMPRESSION: Poor inspiration with bibasilar atelectatic changes.  Limited evaluation lung bases.  Calcified tortuous aorta.  Central pulmonary vascular prominence.   Original Report Authenticated By: Lacy Duverney, M.D.     Cardiac Studies: Tele - nsr with brief atrial fib, now nsr Assessment/Plan:  1. Atrial fib - he is maintaining nsr and should do so as his abdominal process improves. Continue IV cardizem if needed.  2. Elevated troponin - likely secondary to type 2, supply/demand issue. Continue to follow but no change in meds for now, unless he has symptoms.   LOS: 1 day    Gregg Taylor,M.D. 05/31/2012, 4:23 PM

## 2012-05-31 NOTE — Progress Notes (Addendum)
[facility] Progress Note [place] TEAM 1 - Stepdown/ICU TEAM   [name] [name] WUJ:[id number] DOB: 01/03/1931 DOA: 05/30/2012 PCP: Miguel Aschoff, MD  Brief narrative: Patient is a 79 year old male with multiple medical problems including chronic atrial fibrillation on pradaxa, stroke, hypertension, gastroparesis presented to ED with above complaints. History was obtained from the patient and his wife present in the room. Apparently patient's the symptoms started 2 days ago with abdominal pain, diffusely with cramping sensation, 7-8/10. Subsequently he noticed that his abdomen was getting distended. He also has been having intermittent diarrhea recently in last month and saw his gastroenterologist (Dr. Madilyn Fireman). Patient states that he was advised lactose-free diet but that did not improve the diarrhea. This morning patient was feeling dizzy and lightheaded and an episode of diarrhea. He had a syncopal episode, patient denied hitting his head and was witnessed by his wife. Per his wife, he was out for only a few seconds. When patient presented to the ED, he was noted to be in rapid A. Fib, HR in 130's, hypotensive BP in 80's, he was placed on IV fluids. Patient had 2 episodes of coffee-ground emesis in the ED. FOBT was positive in ED.  CT scan of the abdomen was obtained due to the abdominal distention and coffee-ground emesis which showed moderate ascites with diffuse fluid filled and dilated small bowel likely mechanical small bowel obstruction with ischemic/infectious and inflammatory etiologies.    Assessment/Plan: Principal Problem:   Large bowel inflammation Suspected to be possible ischemic colitis Have sigmoidoscopy in the a.m.  Active Problems: Hematemesis NG tube draining dark colored fluid GI plans on clamping the tube, removing it if possible and performing EGD tomorrow Pradaxa on hold  Rapid atrial fibrillation Currently in normal sinus rhythm Cardiology has been  following  Elevated cardiac enzymes Likely due to rapid heart rate/ demand ischemia    HYPERTENSION, BENIGN SYSTEMIC BP reasonably controlled    Gastroparesis Has been referred to [facility] for this    Syncope  Likely due to above issues full code  Recent CVA We don't have records but he was admitted on 1/14 for CV[facility] at [facility] then was admitted to [facility] on 1/29 for expressive aphasia.    Code Status: Full code Family Communication: Wife at bedside  Disposition Plan: Continue to follow in step down unit  Consultants: Gastroenterology Cardiology  Procedures: None  Antibiotics: Ciprofloxacin 3/13>> Flagyl 3/13>>  DVT prophylaxis: SCDs  HPI/Subjective: Patient states that abdominal distention has improved significantly. He complains of no pain or nausea.   Objective: Blood pressure 112/81, pulse 83, temperature 97.2 F (36.2 C), temperature source Oral, resp. rate 26, height 6\' 1"  (1.854 m), weight 84.9 kg (187 lb 2.7 oz), SpO2 100.00%.  Intake/Output Summary (Last 24 hours) at 05/31/12 1722 Last data filed at 05/31/12 1600  Gross per 24 hour  Intake 3472.92 ml  Output   3065 ml  Net 407.92 ml     Exam: General: Awake alert oriented x3, No acute respiratory distress Lungs: Clear to auscultation bilaterally without wheezes or crackles Cardiovascular: Regular rate and rhythm without murmur gallop or rub normal S1 and S2 Abdomen: Nontender, mildly distended, soft, unable to assess bowel sounds has an NG tube on suction, no rebound Extremities: No significant cyanosis, clubbing, or edema bilateral lower extremities  Data Reviewed: Basic Metabolic Panel:  Recent Labs Lab 05/30/12 0926 05/31/12 0210  NA 138 139  K 4.1 3.4*  CL 101 105  CO2 27 22  GLUCOSE 162*  122*  BUN 19 27*  CREATININE 1.24 1.27  CALCIUM 9.6 9.1   Liver Function Tests: No results found for this basename: AST, ALT, ALKPHOS, BILITOT, PROT, ALBUMIN,  in the  last 168 hours No results found for this basename: LIPASE, AMYLASE,  in the last 168 hours No results found for this basename: AMMONIA,  in the last 168 hours CBC:  Recent Labs Lab 05/30/12 0926 05/31/12 0210  WBC 12.7* 10.3  NEUTROABS 11.2*  --   HGB 12.9* 11.5*  HCT 36.8* 33.5*  MCV 98.1 97.4  PLT 317 287   Cardiac Enzymes:  Recent Labs Lab 05/30/12 1527 05/30/12 1941 05/31/12 0209  CKTOTAL 74 95 102  CKMB 5.5* 8.3* 11.7*  TROPONINI 0.38* 0.58* 2.90*   BNP (last 3 results) No results found for this basename: PROBNP,  in the last 8760 hours CBG: No results found for this basename: GLUCAP,  in the last 168 hours  Recent Results (from the past 240 hour(s))  CLOSTRIDIUM DIFFICILE BY PCR     Status: None   Collection Time    05/30/12  9:36 PM      Result Value Range Status   C difficile by pcr NEGATIVE  NEGATIVE Final  MRSA PCR SCREENING     Status: None   Collection Time    05/31/12  2:46 AM      Result Value Range Status   MRSA by PCR NEGATIVE  NEGATIVE Final   Comment:            The GeneXpert MRSA Assay (FDA     approved for NASAL specimens     only), is one component of a     comprehensive MRSA colonization     surveillance program. It is not     intended to diagnose MRSA     infection nor to guide or     monitor treatment for     MRSA infections.     Studies:  Recent x-ray studies have been reviewed in detail by the Attending Physician  Scheduled Meds:  Scheduled Meds: . sodium chloride   Intravenous STAT  . ciprofloxacin  400 mg Intravenous Q12H  . metronidazole  500 mg Intravenous Q8H  . pantoprazole (PROTONIX) IV  40 mg Intravenous Q12H  . sodium chloride  3 mL Intravenous Q12H   Continuous Infusions: . sodium chloride 125 mL/hr (05/31/12 1505)  . diltiazem (CARDIZEM) infusion      Time spent on care of this patient: 35 minutes   [facility]  [facility] Office  [phone] Pager - Text Page per Loretha Stapler as per  below:  On-Call/Text Page:      [email or web address]      password [email or web address]  If 7PM-7AM, please contact night-coverage [email or web address] Password [email or web address] 05/31/2012, 5:22 PM   LOS: 1 day

## 2012-05-31 NOTE — Progress Notes (Signed)
INITIAL NUTRITION ASSESSMENT  DOCUMENTATION CODES Per approved criteria  -Not Applicable   INTERVENTION: When able to advance diet, recommend adding Resource Breeze TID. This supplement provides 250 kcal and 9 grams of protein.   NUTRITION DIAGNOSIS: Inadequate oral intake related to pain and decreased appetite 2/2 gastropariesis as evidenced by 13% weight loss in 6 weeks.   Goal: Pt to meet >/= 90% of estimated needs  Monitor:  PO intake, weight trends   Reason for Assessment: Malnutrition Screening Tool (MST=4)  79 y.o. male  Admitting Dx: SBO (small bowel obstruction)  ASSESSMENT: Pt is 79 yo male brought in after near syncopal event at home. Pt c/o abd pain/bloating/diarrhea with hx of gastroparesis and recent hx of a-fib. On 3/13, pt vomited x2 with dark coffee ground emesis while in ED.  been having intermittent diarrhea recently in last month and saw his gastroenterologist (Dr. Madilyn Fireman). Patient states that he was advised lactose-free diet but that did not improve the diarrhea. CT scan that reveals thickened colon likely 2/2 to ischemia. He also has diffuse ascites. We have been asked to see him for further evaluation. While in there the patient had a melanotic stool.  Per MD note this AM, pt feels better with less abdominal pain. Pt continues to have liquid stool. GI following for GI bleed and on BID protonix and may need endoscopy. NG tube to LIS, continues to be dark. Pt currently NPO.   Dietetic Intern spoke with pt and wife. Pt and wife report 27 lb weight loss in 6 weeks. This 13% weight loss in < 3 months meets criteria for severe weight loss. Pt believes weight loss due to decreased appetite from gastroparesis. Pt does try to eat and has tried Ensure in the past. Ensure supplements make him too full.  Spoke with pt about [employer] and pt very interested in trying supplement when diet advanced.    Height: Ht Readings from Last 1 Encounters:  05/30/12 6\' 1"  (1.854  m)    Weight: Wt Readings from Last 1 Encounters:  05/30/12 187 lb 2.7 oz (84.9 kg)    Ideal Body Weight: 184 lbs   % Ideal Body Weight: 101%  Wt Readings from Last 10 Encounters:  05/30/12 187 lb 2.7 oz (84.9 kg)  05/07/12 187 lb 12.8 oz (85.186 kg)  04/17/12 185 lb (83.915 kg)  11/08/11 201 lb (91.173 kg)  04/06/11 212 lb (96.163 kg)  03/12/11 205 lb (92.987 kg)  02/03/11 211 lb (95.709 kg)  08/04/10 206 lb (93.441 kg)  07/25/10 204 lb (92.534 kg)  07/13/10 208 lb 6.4 oz (94.53 kg)    Usual Body Weight: 210 lbs   % Usual Body Weight: 89%  BMI:  Body mass index is 24.7 kg/(m^2). WNL  Estimated Nutritional Needs: Kcal: 1950-2150 Protein: 105-120 gm Fluid: >/= 2 L  Skin: intact   Diet Order: NPO  EDUCATION NEEDS: -No education needs identified at this time   Intake/Output Summary (Last 24 hours) at 05/31/12 0820 Last data filed at 05/31/12 0700  Gross per 24 hour  Intake   2150 ml  Output   3265 ml  Net  -1115 ml    Last BM: 05/30/2012   Labs:   Recent Labs Lab 05/30/12 0926 05/31/12 0210  NA 138 139  K 4.1 3.4*  CL 101 105  CO2 27 22  BUN 19 27*  CREATININE 1.24 1.27  CALCIUM 9.6 9.1  GLUCOSE 162* 122*    CBG (last 3)  No results  found for this basename: GLUCAP,  in the last 72 hours  Scheduled Meds: . sodium chloride   Intravenous STAT  . ciprofloxacin  400 mg Intravenous Q12H  . metronidazole  500 mg Intravenous Q8H  . pantoprazole (PROTONIX) IV  40 mg Intravenous Q12H  . potassium chloride  10 mEq Intravenous Q1 Hr x 2  . sodium chloride  3 mL Intravenous Q12H    Continuous Infusions: . sodium chloride 100 mL/hr at 05/31/12 1610    Past Medical History  Diagnosis Date  . Ramsay Hunt auricular syndrome   . HTN (hypertension)   . Osteoarthritis   . Dysrhythmia 04/17/12  . Anxiety   . Stroke   . H/O Bell's palsy at age 77  . Atrial flutter   . Atrial fibrillation     Past Surgical History  Procedure Laterality Date  .  Hernia repair    . Vasectomy    . Prostate biopsies      per Vonita Moss  . Cataract extraction      SE eye    Belenda Cruise  Dietetic Intern Pager: [phone]

## 2012-05-31 NOTE — Progress Notes (Signed)
UR Completed.  [name], [name] Jane [phone] 05/31/2012  

## 2012-05-31 NOTE — Progress Notes (Signed)
I agree with student dietitian note.  Kimberly Harris, RD, LDN, CNSC Pager# [phone] After Hours Pager# [phone]   

## 2012-05-31 NOTE — Progress Notes (Signed)
Subjective: Pt feels better this AM with less abd pain.  Con't with liquid stool.  NGT con't to be dark   Objective: Vital signs in last 24 hours: Temp:  [97.3 F (36.3 C)-98.5 F (36.9 C)] 98.1 F (36.7 C) (03/14 0350) Pulse Rate:  [61-136] 76 (03/14 0700) Resp:  [15-29] 21 (03/14 0700) BP: (89-145)/(56-95) 139/85 mmHg (03/14 0700) SpO2:  [88 %-100 %] 100 % (03/14 0700) FiO2 (%):  [50 %] 50 % (03/13 2045) Weight:  [185 lb (83.915 kg)-187 lb 2.7 oz (84.9 kg)] 187 lb 2.7 oz (84.9 kg) (03/13 2015) Last BM Date: 05/30/12  Intake/Output from previous day: 03/13 0701 - 03/14 0700 In: 1950 [I.V.:1450; IV Piggyback:500] Out: 3265 [Urine:365; Emesis/NG output:600; Stool:1600] Intake/Output this shift:    General appearance: alert and cooperative GI: s/nd/ min ttp / active BS  Lab Results:   Recent Labs  05/30/12 0926 05/31/12 0210  WBC 12.7* 10.3  HGB 12.9* 11.5*  HCT 36.8* 33.5*  PLT 317 287   BMET  Recent Labs  05/30/12 0926 05/31/12 0210  NA 138 139  K 4.1 3.4*  CL 101 105  CO2 27 22  GLUCOSE 162* 122*  BUN 19 27*  CREATININE 1.24 1.27  CALCIUM 9.6 9.1   PT/INR No results found for this basename: LABPROT, INR,  in the last 72 hours ABG No results found for this basename: PHART, PCO2, PO2, HCO3,  in the last 72 hours  Studies/Results: Ct Abdomen Pelvis W Contrast  05/30/2012  *RADIOLOGY REPORT*  Clinical Data: Near-syncope.  Abdominal pain with bloating and diarrhea.  CT ABDOMEN AND PELVIS WITH CONTRAST  Technique:  Multidetector CT imaging of the abdomen and pelvis was performed following the standard protocol during bolus administration of intravenous contrast.  Contrast: 80mL OMNIPAQUE IOHEXOL 300 MG/ML  SOLN  Comparison: None.  Findings: Lung bases show dependent atelectasis bilaterally.  Heart size normal.  No pericardial effusion.  There may be trace left pleural fluid.  Distal esophagus is dilated and fluid-filled.  Intrahepatic biliary duct  dilatation.  Extrahepatic bile duct is somewhat difficult to follow but is likely within normal limits. Intermediate attenuation material layers in the gallbladder.  Right kidney is unremarkable.  Low attenuation lesions in the left kidney measure up to 1.5 cm and are likely cysts.  Spleen, pancreas, stomach and duodenum are unremarkable.  There is rather diffuse distention of fluid-filled small bowel, without a discrete transition point.  The cecum and proximal ascending colon are fluid filled and dilated as well.  However, there is a fairly long segment of under distention of the colon with noticeable wall thickening involving the distal ascending, transverse and proximal descending portions.  The distal descending and rectosigmoid colon are minimally dilated and contains fluid.  Moderate ascites.  Mesenteric edema.  No definite pathologically enlarged lymph nodes.  Atherosclerotic calcification of the arterial vasculature.  Prostate is enlarged.  No worrisome lytic or sclerotic lesions.  IMPRESSION:  1.  Long segment of nondistended and thickened colon, involving the ascending, transverse and descending portions, with diffuse fluid filled and dilated small bowel.  Ischemic and infectious/inflammatory etiologies involving the colon are considered, with resultant mechanical small bowel obstruction. Critical Value/emergent results were called by telephone at the time of interpretation on 05/30/2012 at 1225 hours to Dr. Radford Pax, who verbally acknowledged these results. 2.  Tiny left pleural effusion and moderate ascites. 3.  Intrahepatic biliary duct dilatation, of uncertain etiology. 4.  Question gallbladder sludge. 5.  Prostate enlargement.  Original Report Authenticated By: Leanna Battles, M.D.     Anti-infectives: Anti-infectives   Start     Dose/Rate Route Frequency Ordered Stop   05/30/12 2200  ciprofloxacin (CIPRO) IVPB 400 mg     400 mg 200 mL/hr over 60 Minutes Intravenous Every 12 hours 05/30/12  2123     05/30/12 2200  metroNIDAZOLE (FLAGYL) IVPB 500 mg     500 mg 100 mL/hr over 60 Minutes Intravenous Every 8 hours 05/30/12 2123        Assessment/Plan: s/p * No surgery found * Thickening of colon likely 2/2 to ischemia.  Pt with elevated troponins would also support that. -no surgical indications at this time -GI following for GI bleed and on BID protonix and may need endoscopy -Will follow    LOS: 1 day    Marigene Ehlers., [facility] 05/31/2012

## 2012-05-31 NOTE — Progress Notes (Signed)
[facility] ADULT ICU REPLACEMENT PROTOCOL FOR AM LAB REPLACEMENT ONLY  The patient does apply for the [facility] Adult ICU Electrolyte Replacment Protocol based on the criteria listed below:   1. Is GFR >/= 40 ml/min? yes  Patient's GFR today is 60 2. Is urine output >/= 0.5 ml/kg/hr for the last 6 hours? yes Patient's UOP is .53 ml/kg/hr 3. Is BUN < 60 mg/dL? yes  Patient's BUN today is 27 4. Abnormal electrolyte(s): Potassium 3.4 5. Ordered repletion with: Potassium per protocol. 6. If a panic level lab has been reported, has the CCM MD in charge been notified? no.   Physician:    Thomasenia Bottoms 05/31/2012 5:31 AM

## 2012-05-31 NOTE — Progress Notes (Signed)
[facility] PROGRESS NOTE Subjective Pt feels much better today, is watching basketball  Still with some abdominal pain but much better. Still some diarrhea.  Objective: Vital signs in last 24 hours: Temp:  [97.2 F (36.2 C)-98.5 F (36.9 C)] 97.2 F (36.2 C) (03/14 1200) Pulse Rate:  [71-141] 141 (03/14 1500) Resp:  [15-28] 28 (03/14 1500) BP: (89-145)/(56-95) 102/81 mmHg (03/14 1500) SpO2:  [88 %-100 %] 98 % (03/14 1500) FiO2 (%):  [50 %] 50 % (03/13 2045) Weight:  [84.9 kg (187 lb 2.7 oz)] 84.9 kg (187 lb 2.7 oz) (03/13 2015) Last BM Date: 05/31/12  Intake/Output from previous day: 03/13 0701 - 03/14 0700 In: 2150 [I.V.:1650; IV Piggyback:500] Out: 3265 [Urine:365; Emesis/NG output:600; Stool:1600] Intake/Output this shift: Total I/O In: 1200 [I.V.:800; IV Piggyback:400] Out: 300 [Urine:300]  PE: Gen--looks great!, alert talkative Lungs--rhonchi Abd--much less distended, +BSs, slight LUQ tenderness  Lab Results:  Recent Labs  05/30/12 0926 05/31/12 0210  WBC 12.7* 10.3  HGB 12.9* 11.5*  HCT 36.8* 33.5*  PLT 317 287   BMET  Recent Labs  05/30/12 0926 05/31/12 0210  NA 138 139  K 4.1 3.4*  CL 101 105  CO2 27 22  CREATININE 1.24 1.27   LFT No results found for this basename: PROT, AST, ALT, ALKPHOS, BILITOT, BILIDIR, IBILI,  in the last 72 hours PT/INR No results found for this basename: LABPROT, INR,  in the last 72 hours PANCREAS No results found for this basename: LIPASE,  in the last 72 hours       Studies/Results: Ct Abdomen Pelvis W Contrast  05/30/2012  *RADIOLOGY REPORT*  Clinical Data: Near-syncope.  Abdominal pain with bloating and diarrhea.  CT ABDOMEN AND PELVIS WITH CONTRAST  Technique:  Multidetector CT imaging of the abdomen and pelvis was performed following the standard protocol during bolus administration of intravenous contrast.  Contrast: 80mL OMNIPAQUE IOHEXOL 300 MG/ML  SOLN  Comparison: None.  Findings: Lung bases  show dependent atelectasis bilaterally.  Heart size normal.  No pericardial effusion.  There may be trace left pleural fluid.  Distal esophagus is dilated and fluid-filled.  Intrahepatic biliary duct dilatation.  Extrahepatic bile duct is somewhat difficult to follow but is likely within normal limits. Intermediate attenuation material layers in the gallbladder.  Right kidney is unremarkable.  Low attenuation lesions in the left kidney measure up to 1.5 cm and are likely cysts.  Spleen, pancreas, stomach and duodenum are unremarkable.  There is rather diffuse distention of fluid-filled small bowel, without a discrete transition point.  The cecum and proximal ascending colon are fluid filled and dilated as well.  However, there is a fairly long segment of under distention of the colon with noticeable wall thickening involving the distal ascending, transverse and proximal descending portions.  The distal descending and rectosigmoid colon are minimally dilated and contains fluid.  Moderate ascites.  Mesenteric edema.  No definite pathologically enlarged lymph nodes.  Atherosclerotic calcification of the arterial vasculature.  Prostate is enlarged.  No worrisome lytic or sclerotic lesions.  IMPRESSION:  1.  Long segment of nondistended and thickened colon, involving the ascending, transverse and descending portions, with diffuse fluid filled and dilated small bowel.  Ischemic and infectious/inflammatory etiologies involving the colon are considered, with resultant mechanical small bowel obstruction. Critical Value/emergent results were called by telephone at the time of interpretation on 05/30/2012 at 1225 hours to Dr. Radford Pax, who verbally acknowledged these results. 2.  Tiny left pleural effusion and moderate ascites. 3.  Intrahepatic biliary duct dilatation, of uncertain etiology. 4.  Question gallbladder sludge. 5.  Prostate enlargement.   Original Report Authenticated By: Leanna Battles, M.D.    Dg Chest Port 1  View  05/31/2012  *RADIOLOGY REPORT*  Clinical Data: Ascites.  Pleural effusion.  PORTABLE CHEST - 1 VIEW  Comparison: 05/10/2012.  Findings: Nasogastric tube gastric fundus level.  Bibasilar atelectasis.  Poor inspiration.  Limited evaluation lung bases.  Central pulmonary vascular prominence.  Tortuous aorta.  No gross pneumothorax.  Heart size top normal.  IMPRESSION: Poor inspiration with bibasilar atelectatic changes.  Limited evaluation lung bases.  Calcified tortuous aorta.  Central pulmonary vascular prominence.   Original Report Authenticated By: Lacy Duverney, M.D.     Medications: I have reviewed the patient's current medications.  Assessment/Plan: 1. Acute Illness GI bleed, hematemesis, thickened colon on CT. Suspect ischemic colitis with partial SBO. Had N+V prior to all this. Will likely need EGD and probably colon/sigmoid as well. Will keep NPO and clamp the NG tube. If tolerates, ? Pull NG in am and do EGD +/- sigmoid with tap water enema to evaluate for ischemic colitis. My partner will check in the AM.   EDWARDS JR,JAMES L 05/31/2012, 3:28 PM

## 2012-06-01 ENCOUNTER — Inpatient Hospital Stay (HOSPITAL_COMMUNITY): Payer: Medicare Other

## 2012-06-01 DIAGNOSIS — K3184 Gastroparesis: Secondary | ICD-10-CM

## 2012-06-01 DIAGNOSIS — R079 Chest pain, unspecified: Secondary | ICD-10-CM

## 2012-06-01 LAB — BASIC METABOLIC PANEL
BUN: 27 mg/dL — ABNORMAL HIGH (ref 6–23)
GFR calc Af Amer: 68 mL/min — ABNORMAL LOW (ref >90)
GFR calc non Af Amer: 59 mL/min — ABNORMAL LOW (ref >90)
Potassium: 3.6 mEq/L (ref 3.5–5.1)
Sodium: 140 mEq/L (ref 135–145)

## 2012-06-01 LAB — URINE CULTURE: Colony Count: NO GROWTH

## 2012-06-01 MED ORDER — METOPROLOL TARTRATE 1 MG/ML IV SOLN
5.0000 mg | Freq: Four times a day (QID) | INTRAVENOUS | Status: DC
  Administered 2012-06-01 – 2012-06-02 (×3): 5 mg via INTRAVENOUS
  Filled 2012-06-01 (×7): qty 5

## 2012-06-01 NOTE — Progress Notes (Signed)
Patient ID: [name] [name], male   DOB: 1930-12-12, 79 y.o.   MRN: [id number] Subjective:  [name] [name] is a 79 yo wih hx of A-fib.  Sees Dr. Doylene Bode.   Presented with abdominal pain, coffee - ground emesis, abdominal distention and syncope/ pre-syncope.  He has been found to have ischemic colitis.   CK and Troponin levels are mildly elevated.  Objective:  Vital Signs in the last 24 hours: Temp:  [97.2 F (36.2 C)-98.1 F (36.7 C)] 97.5 F (36.4 C) (03/15 0733) Pulse Rate:  [69-147] 81 (03/15 1000) Resp:  [16-28] 19 (03/15 1000) BP: (89-160)/(69-96) 158/91 mmHg (03/15 1000) SpO2:  [93 %-100 %] 97 % (03/15 1000)  Intake/Output from previous day: 03/14 0701 - 03/15 0700 In: 3872.9 [I.V.:2572.9; NG/GT:500; IV Piggyback:800] Out: 1200 [Urine:600; Stool:600] Intake/Output from this shift: Total I/O In: 350 [I.V.:350] Out: 125 [Urine:125]  Physical Exam: Well appearing 79 yo man, NAD HEENT: Unremarkable Neck:  7 cm JVD, no thyromegally Lungs:  Clear with no wheezes, or rhonchi HEART:  Regular rate rhythm, no murmurs, no rubs, no clicks Abd:  soft, positive bowel sounds, no organomegally, no rebound, no guarding Ext:  2 plus pulses, no edema, no cyanosis, no clubbing Skin:  No rashes no nodules Neuro:  CN II through XII intact, motor grossly intact  Lab Results:  Recent Labs  05/30/12 0926 05/31/12 0210  WBC 12.7* 10.3  HGB 12.9* 11.5*  PLT 317 287    Recent Labs  05/31/12 0210 06/01/12 0504  NA 139 140  K 3.4* 3.6  CL 105 108  CO2 22 21  GLUCOSE 122* 85  BUN 27* 27*  CREATININE 1.27 1.15    Recent Labs  05/30/12 1941 05/31/12 0209  TROPONINI 0.58* 2.90*   Hepatic Function Panel  Imaging: Dg Abd 1 View  06/01/2012  *RADIOLOGY REPORT*  Clinical Data: Obstruction  ABDOMEN - 1 VIEW  Comparison: 05/30/2012  Findings: Distended small bowel loops are scattered across the abdomen.  Minimal colonic gas.  NG tube coiled in the fundus of the stomach.   Phleboliths project over the pelvis.  No obvious free intraperitoneal gas. There are small gas bubbles within the region of the wall of a bowel loop along the left lower quadrant.  The bubbles continue into the lumen of the bowel superiorly therefore it is not felt to represent pneumatosis.  IMPRESSION: Stable partial small bowel obstruction pattern.   Original Report Authenticated By: Jolaine Click, M.D.    Ct Abdomen Pelvis W Contrast  05/30/2012  *RADIOLOGY REPORT*  Clinical Data: Near-syncope.  Abdominal pain with bloating and diarrhea.  CT ABDOMEN AND PELVIS WITH CONTRAST  Technique:  Multidetector CT imaging of the abdomen and pelvis was performed following the standard protocol during bolus administration of intravenous contrast.  Contrast: 80mL OMNIPAQUE IOHEXOL 300 MG/ML  SOLN  Comparison: None.  Findings: Lung bases show dependent atelectasis bilaterally.  Heart size normal.  No pericardial effusion.  There may be trace left pleural fluid.  Distal esophagus is dilated and fluid-filled.  Intrahepatic biliary duct dilatation.  Extrahepatic bile duct is somewhat difficult to follow but is likely within normal limits. Intermediate attenuation material layers in the gallbladder.  Right kidney is unremarkable.  Low attenuation lesions in the left kidney measure up to 1.5 cm and are likely cysts.  Spleen, pancreas, stomach and duodenum are unremarkable.  There is rather diffuse distention of fluid-filled small bowel, without a discrete transition point.  The cecum and proximal ascending colon  are fluid filled and dilated as well.  However, there is a fairly long segment of under distention of the colon with noticeable wall thickening involving the distal ascending, transverse and proximal descending portions.  The distal descending and rectosigmoid colon are minimally dilated and contains fluid.  Moderate ascites.  Mesenteric edema.  No definite pathologically enlarged lymph nodes.  Atherosclerotic calcification  of the arterial vasculature.  Prostate is enlarged.  No worrisome lytic or sclerotic lesions.  IMPRESSION:  1.  Long segment of nondistended and thickened colon, involving the ascending, transverse and descending portions, with diffuse fluid filled and dilated small bowel.  Ischemic and infectious/inflammatory etiologies involving the colon are considered, with resultant mechanical small bowel obstruction. Critical Value/emergent results were called by telephone at the time of interpretation on 05/30/2012 at 1225 hours to Dr. Radford Pax, who verbally acknowledged these results. 2.  Tiny left pleural effusion and moderate ascites. 3.  Intrahepatic biliary duct dilatation, of uncertain etiology. 4.  Question gallbladder sludge. 5.  Prostate enlargement.   Original Report Authenticated By: Leanna Battles, M.D.    Dg Chest Port 1 View  05/31/2012  *RADIOLOGY REPORT*  Clinical Data: Ascites.  Pleural effusion.  PORTABLE CHEST - 1 VIEW  Comparison: 05/10/2012.  Findings: Nasogastric tube gastric fundus level.  Bibasilar atelectasis.  Poor inspiration.  Limited evaluation lung bases.  Central pulmonary vascular prominence.  Tortuous aorta.  No gross pneumothorax.  Heart size top normal.  IMPRESSION: Poor inspiration with bibasilar atelectatic changes.  Limited evaluation lung bases.  Calcified tortuous aorta.  Central pulmonary vascular prominence.   Original Report Authenticated By: Lacy Duverney, M.D.     Cardiac Studies: Tele - nsr with brief atrial fib, now nsr Assessment/Plan:  1. Atrial fib - he is maintaining nsr and should do so as his abdominal process improves. Continue IV cardizem if needed.  2. Elevated troponin - likely secondary to type 2, supply/demand issue. Continue to follow but no change in meds for now, unless he has symptoms.   Dr. Sanjuana Kava to address further evaluatino for CAD.  We will need his abdominal issues to be resolved / improved prior to any further evaluation ( ie cath / PCI)      LOS: 2 days    Kristeen Miss Jr.,M.D. 06/01/2012, 10:44 AM

## 2012-06-01 NOTE — Progress Notes (Signed)
Subjective: Pt with some minor abd pain.  con't with liquid stool in pouch, non-bloody NGT clamped overnight  Objective: Vital signs in last 24 hours: Temp:  [97.2 F (36.2 C)-98.1 F (36.7 C)] 97.5 F (36.4 C) (03/15 0733) Pulse Rate:  [69-147] 77 (03/15 0800) Resp:  [16-28] 25 (03/15 0800) BP: (89-160)/(67-96) 148/96 mmHg (03/15 0800) SpO2:  [93 %-100 %] 98 % (03/15 0800) Last BM Date: 05/31/12  Intake/Output from previous day: 03/14 0701 - 03/15 0700 In: 3872.9 [I.V.:2572.9; NG/GT:500; IV Piggyback:800] Out: 1200 [Urine:600; Stool:600] Intake/Output this shift: Total I/O In: 350 [I.V.:350] Out: 125 [Urine:125]  General appearance: alert and cooperative GI: soft, non-tender; bowel sounds normal; no masses,  no organomegaly  Lab Results:   Recent Labs  05/30/12 0926 05/31/12 0210  WBC 12.7* 10.3  HGB 12.9* 11.5*  HCT 36.8* 33.5*  PLT 317 287   BMET  Recent Labs  05/31/12 0210 06/01/12 0504  NA 139 140  K 3.4* 3.6  CL 105 108  CO2 22 21  GLUCOSE 122* 85  BUN 27* 27*  CREATININE 1.27 1.15  CALCIUM 9.1 8.6   PT/INR No results found for this basename: LABPROT, INR,  in the last 72 hours ABG No results found for this basename: PHART, PCO2, PO2, HCO3,  in the last 72 hours  Studies/Results: Ct Abdomen Pelvis W Contrast  05/30/2012  *RADIOLOGY REPORT*  Clinical Data: Near-syncope.  Abdominal pain with bloating and diarrhea.  CT ABDOMEN AND PELVIS WITH CONTRAST  Technique:  Multidetector CT imaging of the abdomen and pelvis was performed following the standard protocol during bolus administration of intravenous contrast.  Contrast: 80mL OMNIPAQUE IOHEXOL 300 MG/ML  SOLN  Comparison: None.  Findings: Lung bases show dependent atelectasis bilaterally.  Heart size normal.  No pericardial effusion.  There may be trace left pleural fluid.  Distal esophagus is dilated and fluid-filled.  Intrahepatic biliary duct dilatation.  Extrahepatic bile duct is somewhat  difficult to follow but is likely within normal limits. Intermediate attenuation material layers in the gallbladder.  Right kidney is unremarkable.  Low attenuation lesions in the left kidney measure up to 1.5 cm and are likely cysts.  Spleen, pancreas, stomach and duodenum are unremarkable.  There is rather diffuse distention of fluid-filled small bowel, without a discrete transition point.  The cecum and proximal ascending colon are fluid filled and dilated as well.  However, there is a fairly long segment of under distention of the colon with noticeable wall thickening involving the distal ascending, transverse and proximal descending portions.  The distal descending and rectosigmoid colon are minimally dilated and contains fluid.  Moderate ascites.  Mesenteric edema.  No definite pathologically enlarged lymph nodes.  Atherosclerotic calcification of the arterial vasculature.  Prostate is enlarged.  No worrisome lytic or sclerotic lesions.  IMPRESSION:  1.  Long segment of nondistended and thickened colon, involving the ascending, transverse and descending portions, with diffuse fluid filled and dilated small bowel.  Ischemic and infectious/inflammatory etiologies involving the colon are considered, with resultant mechanical small bowel obstruction. Critical Value/emergent results were called by telephone at the time of interpretation on 05/30/2012 at 1225 hours to Dr. Radford Pax, who verbally acknowledged these results. 2.  Tiny left pleural effusion and moderate ascites. 3.  Intrahepatic biliary duct dilatation, of uncertain etiology. 4.  Question gallbladder sludge. 5.  Prostate enlargement.   Original Report Authenticated By: Leanna Battles, M.D.    Dg Chest Port 1 View  05/31/2012  *RADIOLOGY REPORT*  Clinical Data:  Ascites.  Pleural effusion.  PORTABLE CHEST - 1 VIEW  Comparison: 05/10/2012.  Findings: Nasogastric tube gastric fundus level.  Bibasilar atelectasis.  Poor inspiration.  Limited evaluation lung  bases.  Central pulmonary vascular prominence.  Tortuous aorta.  No gross pneumothorax.  Heart size top normal.  IMPRESSION: Poor inspiration with bibasilar atelectatic changes.  Limited evaluation lung bases.  Calcified tortuous aorta.  Central pulmonary vascular prominence.   Original Report Authenticated By: Lacy Duverney, M.D.     Anti-infectives: Anti-infectives   Start     Dose/Rate Route Frequency Ordered Stop   05/30/12 2200  ciprofloxacin (CIPRO) IVPB 400 mg     400 mg 200 mL/hr over 60 Minutes Intravenous Every 12 hours 05/30/12 2123     05/30/12 2200  metroNIDAZOLE (FLAGYL) IVPB 500 mg     500 mg 100 mL/hr over 60 Minutes Intravenous Every 8 hours 05/30/12 2123        Assessment/Plan: s/p * No surgery found * Likely ischemia changes to colon. Pt con't with liquid stool KUB reviewed - I think its more of ileus instead of pSBO Will await GI scopes No surgical plans at this time    LOS: 2 days    Marigene Ehlers., [facility] 06/01/2012

## 2012-06-01 NOTE — Progress Notes (Signed)
Subjective: Some non-bloody diarrhea. No vomiting; NGT clamped.  Objective: Vital signs in last 24 hours: Temp:  [97.5 F (36.4 C)-98.1 F (36.7 C)] 97.5 F (36.4 C) (03/15 0733) Pulse Rate:  [69-147] 85 (03/15 1100) Resp:  [16-28] 24 (03/15 1100) BP: (89-160)/(69-96) 158/91 mmHg (03/15 1000) SpO2:  [95 %-100 %] 96 % (03/15 1100) Weight change:  Last BM Date: 05/31/12  PE: GEN:  NAD, NGT clamped ABD:  Soft, minimally distended, hypoactive bowel sounds  Lab Results: CBC    Component Value Date/Time   WBC 10.3 05/31/2012 0210   RBC 3.44* 05/31/2012 0210   HGB 11.5* 05/31/2012 0210   HCT 33.5* 05/31/2012 0210   PLT 287 05/31/2012 0210   MCV 97.4 05/31/2012 0210   MCH 33.4 05/31/2012 0210   MCHC 34.3 05/31/2012 0210   RDW 13.1 05/31/2012 0210   LYMPHSABS 0.7 05/30/2012 0926   MONOABS 0.8 05/30/2012 0926   EOSABS 0.0 05/30/2012 0926   BASOSABS 0.0 05/30/2012 0926   Assessment:  1.  Hematemesis in setting of Pradaxa.  No further hematemesis. 2.  Diarrhea. 3.  Abnormal CT, descending colon thickening.  Plan:  1.  Clamp NGT, trial of sips clears. 2.  Conservative medical management over weekend. 3.  EGD and sigmoidoscopy likely early next week.   [name] [name] 06/01/2012, 12:01 PM

## 2012-06-01 NOTE — Progress Notes (Addendum)
[facility] Progress Note [place] TEAM 1 - Stepdown/ICU TEAM   [name] [name] YNW:[id number] DOB: 1930/10/11 DOA: 05/30/2012 PCP: Miguel Aschoff, MD  Brief narrative: Patient is a 79 year old male with multiple medical problems including chronic atrial fibrillation on pradaxa, stroke, hypertension, gastroparesis presented to ED with above complaints. History was obtained from the patient and his wife present in the room. Apparently patient's the symptoms started 2 days ago with abdominal pain, diffusely with cramping sensation, 7-8/10. Subsequently he noticed that his abdomen was getting distended. He also has been having intermittent diarrhea recently in last month and saw his gastroenterologist (Dr. Madilyn Fireman). Patient states that he was advised lactose-free diet but that did not improve the diarrhea. This morning patient was feeling dizzy and lightheaded and an episode of diarrhea. He had a syncopal episode, patient denied hitting his head and was witnessed by his wife. Per his wife, he was out for only a few seconds. When patient presented to the ED, he was noted to be in rapid A. Fib, HR in 130's, hypotensive BP in 80's, he was placed on IV fluids. Patient had 2 episodes of coffee-ground emesis in the ED. FOBT was positive in ED.  CT scan of the abdomen was obtained due to the abdominal distention and coffee-ground emesis which showed moderate ascites with diffuse fluid filled and dilated small bowel likely mechanical small bowel obstruction with ischemic/infectious and inflammatory etiologies.    Assessment/Plan: Principal Problem:   Large bowel inflammation -Suspected to be possible ischemic colitis -eventually will have colonoscopy  Active Problems: Hematemesis -NG tube clamped - dark liquid stool in rectal tube -Gi following- plan EGD next week - start clears -Pradaxa on hold  Rapid atrial fibrillation/flutter -Was in sinus rhythm this AM when I evaluated him but now in  a-flutter- rate is controlled in 70s -I did initiate Metoprolol this AM 62m IV Q6 hrs- will continue as it is keeping rate controlled -Cardiology has been following  Elevated cardiac enzymes -Likely due to rapid heart rate/ demand ischemia    HYPERTENSION, BENIGN SYSTEMIC -BP reasonably controlled    Gastroparesis -Has been referred to [facility] for this    Syncope  -Likely due to above issues full code  Recent CVA -We don't have records but he was admitted on 1/14 for CVA at [facility] then was admitted to [facility] on 1/29 for expressive aphasia.    Code Status: Full code Family Communication: Wife at bedside  Disposition Plan: Continue to follow in step down unit  Consultants: Gastroenterology Cardiology  Procedures: None  Antibiotics: Ciprofloxacin 3/13>> Flagyl 3/13>>  DVT prophylaxis: SCDs  HPI/Subjective: Patient has had NG clamped since last night and has no complaints of nausea or abdominal distension   Objective: Blood pressure 154/126, pulse 105, temperature 98.2 F (36.8 C), temperature source Oral, resp. rate 27, height 6\' 1"  (1.854 m), weight 84.9 kg (187 lb 2.7 oz), SpO2 97.00%.  Intake/Output Summary (Last 24 hours) at 06/01/12 1232 Last data filed at 06/01/12 1100  Gross per 24 hour  Intake 3997.92 ml  Output   1125 ml  Net 2872.92 ml     Exam: General: Awake alert oriented x3, No acute respiratory distress Lungs: Clear to auscultation bilaterally without wheezes or crackles Cardiovascular: Regular rate and rhythm without murmur gallop or rub normal S1 and S2 Abdomen: Nontender, mildly distended, soft, BS positive, mild tender in upper abdomen Extremities: No significant cyanosis, clubbing, or edema bilateral lower extremities  Data Reviewed: Basic Metabolic Panel:  Recent Labs Lab 05/30/12 0926 05/31/12 0210 06/01/12 0504  NA 138 139 140  K 4.1 3.4* 3.6  CL 101 105 108  CO2 27 22 21   GLUCOSE 162* 122* 85  BUN  19 27* 27*  CREATININE 1.24 1.27 1.15  CALCIUM 9.6 9.1 8.6   Liver Function Tests: No results found for this basename: AST, ALT, ALKPHOS, BILITOT, PROT, ALBUMIN,  in the last 168 hours No results found for this basename: LIPASE, AMYLASE,  in the last 168 hours No results found for this basename: AMMONIA,  in the last 168 hours CBC:  Recent Labs Lab 05/30/12 0926 05/31/12 0210  WBC 12.7* 10.3  NEUTROABS 11.2*  --   HGB 12.9* 11.5*  HCT 36.8* 33.5*  MCV 98.1 97.4  PLT 317 287   Cardiac Enzymes:  Recent Labs Lab 05/30/12 1527 05/30/12 1941 05/31/12 0209  CKTOTAL 74 95 102  CKMB 5.5* 8.3* 11.7*  TROPONINI 0.38* 0.58* 2.90*   BNP (last 3 results) No results found for this basename: PROBNP,  in the last 8760 hours CBG: No results found for this basename: GLUCAP,  in the last 168 hours  Recent Results (from the past 240 hour(s))  URINE CULTURE     Status: None   Collection Time    05/30/12  7:42 PM      Result Value Range Status   Specimen Description URINE, RANDOM   Final   Special Requests NONE   Final   Culture  Setup Time 05/31/2012 03:28   Final   Colony Count NO GROWTH   Final   Culture NO GROWTH   Final   Report Status 06/01/2012 FINAL   Final  CLOSTRIDIUM DIFFICILE BY PCR     Status: None   Collection Time    05/30/12  9:36 PM      Result Value Range Status   C difficile by pcr NEGATIVE  NEGATIVE Final  URINE CULTURE     Status: None   Collection Time    05/31/12  2:23 AM      Result Value Range Status   Specimen Description URINE, CATHETERIZED   Final   Special Requests NONE   Final   Culture  Setup Time 05/31/2012 03:39   Final   Colony Count NO GROWTH   Final   Culture NO GROWTH   Final   Report Status 06/01/2012 FINAL   Final  MRSA PCR SCREENING     Status: None   Collection Time    05/31/12  2:46 AM      Result Value Range Status   MRSA by PCR NEGATIVE  NEGATIVE Final   Comment:            The GeneXpert MRSA Assay (FDA     approved for NASAL  specimens     only), is one component of a     comprehensive MRSA colonization     surveillance program. It is not     intended to diagnose MRSA     infection nor to guide or     monitor treatment for     MRSA infections.     Studies:  Recent x-ray studies have been reviewed in detail by the Attending Physician  Scheduled Meds:  Scheduled Meds: . ciprofloxacin  400 mg Intravenous Q12H  . metoprolol  5 mg Intravenous Q6H  . metronidazole  500 mg Intravenous Q8H  . pantoprazole (PROTONIX) IV  40 mg Intravenous Q12H  . sodium chloride  3 mL Intravenous  Q12H   Continuous Infusions: . sodium chloride 125 mL/hr at 06/01/12 1100  . diltiazem (CARDIZEM) infusion      Time spent on care of this patient: 35 minutes   [facility]  [facility] Office  [phone] Pager - Text Page per Loretha Stapler as per below:  On-Call/Text Page:      [email or web address]      password [email or web address]  If 7PM-7AM, please contact night-coverage [email or web address] Password [facility] 06/01/2012, 12:32 PM   LOS: 2 days

## 2012-06-02 DIAGNOSIS — K56609 Unspecified intestinal obstruction, unspecified as to partial versus complete obstruction: Secondary | ICD-10-CM

## 2012-06-02 LAB — BASIC METABOLIC PANEL
Chloride: 108 mEq/L (ref 96–112)
Creatinine, Ser: 0.99 mg/dL (ref 0.50–1.35)
GFR calc Af Amer: 88 mL/min — ABNORMAL LOW (ref >90)

## 2012-06-02 MED ORDER — METOPROLOL TARTRATE 1 MG/ML IV SOLN
5.0000 mg | Freq: Four times a day (QID) | INTRAVENOUS | Status: DC
  Administered 2012-06-02 – 2012-06-08 (×21): 5 mg via INTRAVENOUS
  Filled 2012-06-02 (×26): qty 5

## 2012-06-02 MED ORDER — POTASSIUM CHLORIDE 10 MEQ/100ML IV SOLN
10.0000 meq | INTRAVENOUS | Status: AC
  Administered 2012-06-02 (×4): 10 meq via INTRAVENOUS
  Filled 2012-06-02 (×4): qty 100

## 2012-06-02 NOTE — Progress Notes (Signed)
Subjective: Some increased nausea, NGT again placed to suction.  Objective: Vital signs in last 24 hours: Temp:  [97.7 F (36.5 C)-98.4 F (36.9 C)] 97.7 F (36.5 C) (03/16 0742) Pulse Rate:  [56-105] 69 (03/16 0900) Resp:  [18-27] 19 (03/16 0900) BP: (114-154)/(72-126) 128/82 mmHg (03/16 0900) SpO2:  [93 %-98 %] 98 % (03/16 0900) Weight change:  Last BM Date: 06/01/12  PE: GEN:  NAD ABD:  More distended, tympany to percussion, active bowel sounds  Lab Results: CBC    Component Value Date/Time   WBC 10.3 05/31/2012 0210   RBC 3.44* 05/31/2012 0210   HGB 11.5* 05/31/2012 0210   HCT 33.5* 05/31/2012 0210   PLT 287 05/31/2012 0210   MCV 97.4 05/31/2012 0210   MCH 33.4 05/31/2012 0210   MCHC 34.3 05/31/2012 0210   RDW 13.1 05/31/2012 0210   LYMPHSABS 0.7 05/30/2012 0926   MONOABS 0.8 05/30/2012 0926   EOSABS 0.0 05/30/2012 0926   BASOSABS 0.0 05/30/2012 0926   Assessment:  1.  Hematemesis in setting of pradaxa, resolved. 2.  Nausea and vomiting. 3.  Abnormal CT scan (ascending, transverse, descending colon thickening with upstream colonic and small bowel dilatation).  Plan:  1.  Clear liquids ok; do not advance at this time. 2.  PPI. 3.  Dr. Matthias Hughs to revisit tomorrow; will consider endoscopy and sigmoidoscopy tomorrow, pending his clinical course today (most notably whether his abdominal distention worsens today).   Freddy Jaksch 06/02/2012, 11:30 AM

## 2012-06-02 NOTE — Progress Notes (Signed)
Patient ID: [name] [name], male   DOB: [birth year], 79 y.o.   MRN: [id number] Subjective:  [name] [name] is a 79 yo wih hx of A-fib.  Sees Dr. Doylene Bode.   Presented with abdominal pain, coffee - ground emesis, abdominal distention and syncope/ pre-syncope.  He has been found to have ischemic colitis.   CK and Troponin levels are mildly elevated.  Objective:  Vital Signs in the last 24 hours: Temp:  [97.7 F (36.5 C)-98.4 F (36.9 C)] 97.7 F (36.5 C) (03/16 0742) Pulse Rate:  [56-105] 69 (03/16 0900) Resp:  [18-27] 19 (03/16 0900) BP: (114-154)/(72-126) 128/82 mmHg (03/16 0900) SpO2:  [93 %-98 %] 98 % (03/16 0900)  Intake/Output from previous day: 03/15 0701 - 03/16 0700 In: 3700 [I.V.:3100; IV Piggyback:600] Out: 925 [Urine:425; Stool:500] Intake/Output from this shift: Total I/O In: 250 [I.V.:250] Out: -   Physical Exam: Well appearing 79 yo man, NAD HEENT: Unremarkable Neck:  No JVD, no thyromegally, NG tube in  Lungs:  Clear with no wheezes, or rhonchi HEART:  Regular rate rhythm, no murmurs, no rubs, no clicks Abd:  soft, positive bowel sounds, no organomegally, no rebound, no guarding Ext:  2 plus pulses, no edema, no cyanosis, no clubbing Skin:  No rashes no nodules Neuro:  CN II through XII intact, motor grossly intact  Lab Results:  Recent Labs  05/31/12 0210  WBC 10.3  HGB 11.5*  PLT 287    Recent Labs  05/31/12 0210 06/01/12 0504  NA 139 140  K 3.4* 3.6  CL 105 108  CO2 22 21  GLUCOSE 122* 85  BUN 27* 27*  CREATININE 1.27 1.15    Recent Labs  05/30/12 1941 05/31/12 0209  TROPONINI 0.58* 2.90*   Hepatic Function Panel  Imaging: Dg Abd 1 View  06/01/2012  *RADIOLOGY REPORT*  Clinical Data: Obstruction  ABDOMEN - 1 VIEW  Comparison: 05/30/2012  Findings: Distended small bowel loops are scattered across the abdomen.  Minimal colonic gas.  NG tube coiled in the fundus of the stomach.  Phleboliths project over the pelvis.  No obvious  free intraperitoneal gas. There are small gas bubbles within the region of the wall of a bowel loop along the left lower quadrant.  The bubbles continue into the lumen of the bowel superiorly therefore it is not felt to represent pneumatosis.  IMPRESSION: Stable partial small bowel obstruction pattern.   Original Report Authenticated By: Jolaine Click, M.D.     Cardiac Studies: Tele - nsr with brief atrial fib, now nsr  Assessment/Plan:  1. Atrial fib - still in NR 2. Elevated troponin - likely secondary to type 2, supply/demand issue. Continue to follow but no change in meds for now, unless he has symptoms.   Dr. Sanjuana Kava to address further evaluation  for CAD.  We will need his abdominal issues to be resolved / improved prior to any further evaluation ( ie cath / PCI)     LOS: 3 days    Kristeen Miss Jr.,M.D. 06/02/2012, 10:00 AM

## 2012-06-02 NOTE — Progress Notes (Signed)
Pt with c/o nausea. Prn medication given. ngt remains clamped. Pt reports relief post prn med. Will continue to monitor and advise attending as needed.

## 2012-06-02 NOTE — Progress Notes (Signed)
Patient ID: [name] [name], male   DOB: [birth year], 79 y.o.   MRN: [id number]  General Surgery - [facility] - Progress Note  Subjective: Patient in ICU.  Awake, alert.  Continues to have diarrhea.  Less pain.  Objective: Vital signs in last 24 hours: Temp:  [97.7 F (36.5 C)-98.4 F (36.9 C)] 97.7 F (36.5 C) (03/16 0742) Pulse Rate:  [56-105] 69 (03/16 0900) Resp:  [18-27] 19 (03/16 0900) BP: (114-154)/(72-126) 128/82 mmHg (03/16 0900) SpO2:  [93 %-98 %] 98 % (03/16 0900) Last BM Date: 06/01/12  Intake/Output from previous day: 03/15 0701 - 03/16 0700 In: 3700 [I.V.:3100; IV Piggyback:600] Out: 925 [Urine:425; Stool:500]  Exam: HEENT - clear, not icteric Neck - soft Chest - clear bilaterally Cor - irreg, no murmur Abd - moderate distension, BS present; mild diffuse tenderness; no hernia Ext - no significant edema Neuro - grossly intact, no focal deficits  Lab Results:   Recent Labs  05/31/12 0210  WBC 10.3  HGB 11.5*  HCT 33.5*  PLT 287     Recent Labs  06/01/12 0504 06/02/12 0912  NA 140 140  K 3.6 3.2*  CL 108 108  CO2 21 22  GLUCOSE 85 112*  BUN 27* 20  CREATININE 1.15 0.99  CALCIUM 8.6 8.3*    Studies/Results: Dg Abd 1 View  06/01/2012  *RADIOLOGY REPORT*  Clinical Data: Obstruction  ABDOMEN - 1 VIEW  Comparison: 05/30/2012  Findings: Distended small bowel loops are scattered across the abdomen.  Minimal colonic gas.  NG tube coiled in the fundus of the stomach.  Phleboliths project over the pelvis.  No obvious free intraperitoneal gas. There are small gas bubbles within the region of the wall of a bowel loop along the left lower quadrant.  The bubbles continue into the lumen of the bowel superiorly therefore it is not felt to represent pneumatosis.  IMPRESSION: Stable partial small bowel obstruction pattern.   Original Report Authenticated By: Jolaine Click, M.D.     Assessment / Plan: 1.  Probable colitis - ? Etiology  NG  decompression  Planning flex sig exam tomorrow per Dr. Dulce Sellar 2.  Partial SBO versus ileus  Doubt mechanical, likely functional  Continue NPO, NG, IVF Will follow with you and review results of EGD and flex sig tomorrow.  Velora Heckler, MD, [facility] Office: [phone]  06/02/2012

## 2012-06-02 NOTE — Progress Notes (Addendum)
[facility] Progress Note [place] TEAM 1 - Stepdown/ICU TEAM   NIHAL MARZELLA OVF:[id number] DOB: 04-20-1930 DOA: 05/30/2012 PCP: Miguel Aschoff, MD  Brief narrative: Patient is a 79 year old male with multiple medical problems including chronic atrial fibrillation on pradaxa, stroke, hypertension, gastroparesis presented to ED with above complaints. History was obtained from the patient and his wife present in the room. Apparently patient's the symptoms started 2 days ago with abdominal pain, diffusely with cramping sensation, 7-8/10. Subsequently he noticed that his abdomen was getting distended. He also has been having intermittent diarrhea recently in last month and saw his gastroenterologist (Dr. Madilyn Fireman). Patient states that he was advised lactose-free diet but that did not improve the diarrhea. This morning patient was feeling dizzy and lightheaded and an episode of diarrhea. He had a syncopal episode, patient denied hitting his head and was witnessed by his wife. Per his wife, he was out for only a few seconds. When patient presented to the ED, he was noted to be in rapid A. Fib, HR in 130's, hypotensive BP in 80's, he was placed on IV fluids. Patient had 2 episodes of coffee-ground emesis in the ED. FOBT was positive in ED.  CT scan of the abdomen was obtained due to the abdominal distention and coffee-ground emesis which showed moderate ascites with diffuse fluid filled and dilated small bowel likely mechanical small bowel obstruction with ischemic/infectious and inflammatory etiologies.    Assessment/Plan: Principal Problem:   Large bowel inflammation -Suspected to be possible ischemic colitis -eventually will have colonoscopy  Active Problems: Hematemesis -NG tube clamped however pt developed nausea and distension- NG tube suction turned back on with about 400 cc removed  - dark liquid stool in rectal tube -GI following- plan EGD next week - continue clears -Pradaxa on  hold  Rapid atrial fibrillation/flutter -he had an episode of a-flutter yesterday which was short lived Cont IV Metoprolol  -Cardiology has been following  Hypokalemia -Replace and recheck in AM  Elevated cardiac enzymes -Likely due to rapid heart rate/ demand ischemia - further work up per cards     HYPERTENSION, BENIGN SYSTEMIC -BP reasonably controlled    Gastroparesis -Has been referred to [facility] for this    Syncope  -Likely due to above issues full code  Recent CVA -We don't have records but he was admitted on 1/14 for CVA at [facility] then was admitted to [facility] on 1/29 for expressive aphasia.    Code Status: Full code Family Communication: discussed with pt Disposition Plan: Continue to follow in step down unit  Consultants: Gastroenterology Cardiology  Procedures: None  Antibiotics: Ciprofloxacin 3/13>> Flagyl 3/13>>  DVT prophylaxis: SCDs  HPI/Subjective: Developing some nausea today- given Zofran- cannot tell if abdomen is more distended. No abdominal pain.    Objective: Blood pressure 128/82, pulse 69, temperature 97.6 F (36.4 C), temperature source Oral, resp. rate 19, height 6\' 1"  (1.854 m), weight 84.9 kg (187 lb 2.7 oz), SpO2 98.00%.  Intake/Output Summary (Last 24 hours) at 06/02/12 1844 Last data filed at 06/02/12 0900  Gross per 24 hour  Intake   2150 ml  Output    800 ml  Net   1350 ml     Exam: General: Awake alert oriented x3, No acute respiratory distress Lungs: Clear to auscultation bilaterally without wheezes or crackles Cardiovascular: Regular rate and rhythm without murmur gallop or rub normal S1 and S2 Abdomen: Nontender, mildly distended, soft, BS high pitched Extremities: No significant cyanosis, clubbing, or edema  bilateral lower extremities  Data Reviewed: Basic Metabolic Panel:  Recent Labs Lab 05/30/12 0926 05/31/12 0210 06/01/12 0504 06/02/12 0912  NA 138 139 140 140  K 4.1 3.4*  3.6 3.2*  CL 101 105 108 108  CO2 27 22 21 22   GLUCOSE 162* 122* 85 112*  BUN 19 27* 27* 20  CREATININE 1.24 1.27 1.15 0.99  CALCIUM 9.6 9.1 8.6 8.3*   Liver Function Tests: No results found for this basename: AST, ALT, ALKPHOS, BILITOT, PROT, ALBUMIN,  in the last 168 hours No results found for this basename: LIPASE, AMYLASE,  in the last 168 hours No results found for this basename: AMMONIA,  in the last 168 hours CBC:  Recent Labs Lab 05/30/12 0926 05/31/12 0210  WBC 12.7* 10.3  NEUTROABS 11.2*  --   HGB 12.9* 11.5*  HCT 36.8* 33.5*  MCV 98.1 97.4  PLT 317 287   Cardiac Enzymes:  Recent Labs Lab 05/30/12 1527 05/30/12 1941 05/31/12 0209  CKTOTAL 74 95 102  CKMB 5.5* 8.3* 11.7*  TROPONINI 0.38* 0.58* 2.90*   BNP (last 3 results) No results found for this basename: PROBNP,  in the last 8760 hours CBG: No results found for this basename: GLUCAP,  in the last 168 hours  Recent Results (from the past 240 hour(s))  URINE CULTURE     Status: None   Collection Time    05/30/12  7:42 PM      Result Value Range Status   Specimen Description URINE, RANDOM   Final   Special Requests NONE   Final   Culture  Setup Time 05/31/2012 03:28   Final   Colony Count NO GROWTH   Final   Culture NO GROWTH   Final   Report Status 06/01/2012 FINAL   Final  CLOSTRIDIUM DIFFICILE BY PCR     Status: None   Collection Time    05/30/12  9:36 PM      Result Value Range Status   C difficile by pcr NEGATIVE  NEGATIVE Final  URINE CULTURE     Status: None   Collection Time    05/31/12  2:23 AM      Result Value Range Status   Specimen Description URINE, CATHETERIZED   Final   Special Requests NONE   Final   Culture  Setup Time 05/31/2012 03:39   Final   Colony Count NO GROWTH   Final   Culture NO GROWTH   Final   Report Status 06/01/2012 FINAL   Final  MRSA PCR SCREENING     Status: None   Collection Time    05/31/12  2:46 AM      Result Value Range Status   MRSA by PCR  NEGATIVE  NEGATIVE Final   Comment:            The GeneXpert MRSA Assay (FDA     approved for NASAL specimens     only), is one component of a     comprehensive MRSA colonization     surveillance program. It is not     intended to diagnose MRSA     infection nor to guide or     monitor treatment for     MRSA infections.     Studies:  Recent x-ray studies have been reviewed in detail by the Attending Physician  Scheduled Meds:  Scheduled Meds: . ciprofloxacin  400 mg Intravenous Q12H  . metoprolol  5 mg Intravenous Q6H  . metronidazole  500 mg  Intravenous Q8H  . pantoprazole (PROTONIX) IV  40 mg Intravenous Q12H  . sodium chloride  3 mL Intravenous Q12H   Continuous Infusions: . sodium chloride 125 mL/hr at 06/02/12 0900    Time spent on care of this patient: 25 minutes   [facility]  [facility] Office  [phone] Pager - Text Page per Loretha Stapler as per below:  On-Call/Text Page:      [email or web address]      password [email or web address]  If 7PM-7AM, please contact night-coverage [email or web address] Password [email or web address] 06/02/2012, 6:44 PM   LOS: 3 days

## 2012-06-03 ENCOUNTER — Encounter (HOSPITAL_COMMUNITY)
Admission: EM | Disposition: A | Discharge status: Rehabilitation Facility | Payer: Self-pay | Source: Home / Self Care | Attending: Internal Medicine

## 2012-06-03 ENCOUNTER — Encounter (HOSPITAL_COMMUNITY): Payer: Self-pay | Admitting: *Deleted

## 2012-06-03 DIAGNOSIS — R195 Other fecal abnormalities: Secondary | ICD-10-CM

## 2012-06-03 HISTORY — PX: COLONOSCOPY: SHX5424

## 2012-06-03 HISTORY — PX: ESOPHAGOGASTRODUODENOSCOPY: SHX5428

## 2012-06-03 LAB — CBC
MCV: 94.8 fL (ref 78.0–100.0)
Platelets: 237 10*3/uL (ref 150–400)
RBC: 3.24 MIL/uL — ABNORMAL LOW (ref 4.22–5.81)
WBC: 10.2 10*3/uL (ref 4.0–10.5)

## 2012-06-03 LAB — GI PATHOGEN PANEL BY PCR, STOOL
C difficile toxin A/B: NEGATIVE
Campylobacter by PCR: NEGATIVE
Cryptosporidium by PCR: NEGATIVE
E coli (ETEC) LT/ST: NEGATIVE
E coli (STEC): NEGATIVE
E coli 0157 by PCR: NEGATIVE
G lamblia by PCR: NEGATIVE
Norovirus GI/GII: NEGATIVE
Rotavirus A by PCR: NEGATIVE
Salmonella by PCR: NEGATIVE
Shigella by PCR: NEGATIVE

## 2012-06-03 LAB — BASIC METABOLIC PANEL
CO2: 22 mEq/L (ref 19–32)
Calcium: 8.1 mg/dL — ABNORMAL LOW (ref 8.4–10.5)
GFR calc non Af Amer: 79 mL/min — ABNORMAL LOW (ref >90)
Sodium: 138 mEq/L (ref 135–145)

## 2012-06-03 SURGERY — COLONOSCOPY
Anesthesia: Moderate Sedation

## 2012-06-03 MED ORDER — BUTAMBEN-TETRACAINE-BENZOCAINE 2-2-14 % EX AERO
INHALATION_SPRAY | CUTANEOUS | Status: DC | PRN
  Administered 2012-06-03 (×2): 1 via TOPICAL

## 2012-06-03 MED ORDER — FENTANYL CITRATE 0.05 MG/ML IJ SOLN
INTRAMUSCULAR | Status: AC
  Filled 2012-06-03: qty 2

## 2012-06-03 MED ORDER — FENTANYL CITRATE 0.05 MG/ML IJ SOLN
INTRAMUSCULAR | Status: DC | PRN
  Administered 2012-06-03: 25 ug via INTRAVENOUS

## 2012-06-03 MED ORDER — MIDAZOLAM HCL 10 MG/2ML IJ SOLN
INTRAMUSCULAR | Status: DC | PRN
  Administered 2012-06-03: 1 mg via INTRAVENOUS
  Administered 2012-06-03: 2 mg via INTRAVENOUS

## 2012-06-03 MED ORDER — MIDAZOLAM HCL 5 MG/ML IJ SOLN
INTRAMUSCULAR | Status: AC
  Filled 2012-06-03: qty 2

## 2012-06-03 MED ORDER — POTASSIUM CHLORIDE 10 MEQ/100ML IV SOLN
10.0000 meq | INTRAVENOUS | Status: AC
  Administered 2012-06-03 (×3): 10 meq via INTRAVENOUS
  Filled 2012-06-03 (×3): qty 100

## 2012-06-03 MED ORDER — SODIUM CHLORIDE 0.9 % IV SOLN: INTRAVENOUS | Status: DC

## 2012-06-03 NOTE — Progress Notes (Addendum)
[facility] Progress Note [place] TEAM 1 - Stepdown/ICU TEAM   [name] [name] WUJ:[id number] DOB: 1931-02-18 DOA: 05/30/2012 PCP: Miguel Aschoff, MD  Brief narrative: 79 year old male with multiple medical problems including chronic atrial fibrillation on pradaxa, stroke, hypertension, gastroparesis presented to ED with complaints of abdominal distention with associated coffee-ground emesis. The symptoms started 2 days prior with abdominal pain, diffusely with cramping sensation, level  7-8/10. Subsequently he noticed that his abdomen was getting distended. This was associated with intermittent diarrhea over the past month. He had been evaluated by his gastroenterologist (Dr. Madilyn Fireman) who advised lactose-free diet but unfortunately he saw no improvement in the diarrhea. On the morning of admission he reported feeling dizzy and lightheaded and had an episode of diarrhea. He had a syncopal episode, patient denied hitting his head and was witnessed by his wife. Per his wife, he was out for only a few seconds. When patient presented to the ED, he was noted to be in rapid A. Fib, HR in 130's, hypotensive BP in 80's, he was placed on IV fluids. He had 2 episodes of coffee-ground emesis in the ED. FOBT was positive in ED. A CT scan of the abdomen was obtained due to the abdominal distention and coffee-ground emesis which showed moderate ascites with diffuse fluid filled and dilated small bowel likely mechanical small bowel obstruction with ischemic/infectious and inflammatory etiologies.   Assessment/Plan:  Colitis with reactive PSBO -Suspected to be possible ischemic colitis - GI notes may have "intestinal angina" as evidenced by post prandial abd pain, weight loss and angiographic findings suggestive of mesenteric atherosclerosis -colonoscopy pending 3/17 -cont supportive care with IVF/NGT - failed attempt at clamp NGT in past 24 hours  Hematemesis/suspected gastritis - dark liquid stool in rectal  tube -GI following- EGD pending 3/17  - continue clears -Pradaxa on hold  Rapid atrial fibrillation/flutter -he had an episode of a-flutter 3/15 which was short lived -Cont IV Metoprolol  -Cardiology following -keep K+ >4.0  Hypokalemia -Replace and recheck BMET in AM  Elevated cardiac enzymes -Likely due to rapid heart rate/ demand ischemia -Cards plans risk stratification testing once GI issues resolved/improved  HYPERTENSION, BENIGN SYSTEMIC -BP reasonably controlled  Gastroparesis -Has been referred to [facility]   Syncope  -Likely due to above issues full code  Recent CVA -We don't have records but he was admitted on 1/14 for CVA at [facility] - he then was admitted to [facility] on 1/29 for expressive aphasia.    Code Status: Full code Family Communication: discussed with pt Disposition Plan: Continue to follow in step down unit DVT prophylaxis: SCDs  Consultants: Gastroenterology Cardiology  Procedures: None  Antibiotics: Ciprofloxacin 3/13>> Flagyl 3/13>>  HPI/Subjective: Currently denies abd pain or nausea.  No chest pain or sob.    Objective: Blood pressure 148/82, pulse 72, temperature 98 F (36.7 C), temperature source Oral, resp. rate 28, height 6\' 1"  (1.854 m), weight 84.9 kg (187 lb 2.7 oz), SpO2 99.00%.  Intake/Output Summary (Last 24 hours) at 06/03/12 1153 Last data filed at 06/03/12 1100  Gross per 24 hour  Intake   3360 ml  Output   1700 ml  Net   1660 ml    Exam: General: Awake alert oriented x3, No acute respiratory distress Lungs: Clear to auscultation bilaterally without wheezes or crackles, 2 L oxygen Cardiovascular: Regular rate and normal sinus rhythm without murmur gallop or rub normal S1 and S2, no peripheral edema or JVD Abdomen: Nontender, mildly distended, soft, BS high  pitched, NGT to LWS Musculoskeletal: No significant cyanosis, clubbing of bilateral lower extremities Neurological: Awake and oriented x3,  moves all extremities x4, cranial nerves II through XII grossly intact  Data Reviewed: Basic Metabolic Panel:  Recent Labs Lab 05/30/12 0926 05/31/12 0210 06/01/12 0504 06/02/12 0912 06/03/12 0505  NA 138 139 140 140 138  K 4.1 3.4* 3.6 3.2* 3.2*  CL 101 105 108 108 106  CO2 27 22 21 22 22   GLUCOSE 162* 122* 85 112* 107*  BUN 19 27* 27* 20 13  CREATININE 1.24 1.27 1.15 0.99 0.90  CALCIUM 9.6 9.1 8.6 8.3* 8.1*   CBC:  Recent Labs Lab 05/30/12 0926 05/31/12 0210 06/03/12 0505  WBC 12.7* 10.3 10.2  NEUTROABS 11.2*  --   --   HGB 12.9* 11.5* 10.9*  HCT 36.8* 33.5* 30.7*  MCV 98.1 97.4 94.8  PLT 317 287 237   Cardiac Enzymes:  Recent Labs Lab 05/30/12 1527 05/30/12 1941 05/31/12 0209  CKTOTAL 74 95 102  CKMB 5.5* 8.3* 11.7*  TROPONINI 0.38* 0.58* 2.90*    Recent Results (from the past 240 hour(s))  URINE CULTURE     Status: None   Collection Time    05/30/12  7:42 PM      Result Value Range Status   Specimen Description URINE, RANDOM   Final   Special Requests NONE   Final   Culture  Setup Time 05/31/2012 03:28   Final   Colony Count NO GROWTH   Final   Culture NO GROWTH   Final   Report Status 06/01/2012 FINAL   Final  CLOSTRIDIUM DIFFICILE BY PCR     Status: None   Collection Time    05/30/12  9:36 PM      Result Value Range Status   C difficile by pcr NEGATIVE  NEGATIVE Final  URINE CULTURE     Status: None   Collection Time    05/31/12  2:23 AM      Result Value Range Status   Specimen Description URINE, CATHETERIZED   Final   Special Requests NONE   Final   Culture  Setup Time 05/31/2012 03:39   Final   Colony Count NO GROWTH   Final   Culture NO GROWTH   Final   Report Status 06/01/2012 FINAL   Final  MRSA PCR SCREENING     Status: None   Collection Time    05/31/12  2:46 AM      Result Value Range Status   MRSA by PCR NEGATIVE  NEGATIVE Final   Comment:            The GeneXpert MRSA Assay (FDA     approved for NASAL specimens      only), is one component of a     comprehensive MRSA colonization     surveillance program. It is not     intended to diagnose MRSA     infection nor to guide or     monitor treatment for     MRSA infections.     Studies:  Recent x-ray studies have been reviewed in detail by the Attending Physician  Scheduled Meds:  Scheduled Meds: . ciprofloxacin  400 mg Intravenous Q12H  . metoprolol  5 mg Intravenous Q6H  . metronidazole  500 mg Intravenous Q8H  . pantoprazole (PROTONIX) IV  40 mg Intravenous Q12H  . potassium chloride  10 mEq Intravenous Q1 Hr x 4  . sodium chloride  3 mL Intravenous Q12H  Continuous Infusions: . sodium chloride 150 mL/hr at 06/03/12 0600    Time spent on care of this patient: 35 minutes   ELLIS,ALLISON L.ANP Pager: [phone]  [facility] Office  [phone] Pager - Text Page per Loretha Stapler as per below:  On-Call/Text Page:      [email or web address]      password [email or web address]  If 7PM-7AM, please contact night-coverage [email or web address] Password [email or web address] 06/03/2012, 11:53 AM   LOS: 4 days    I have personally examined this patient and reviewed the entire database. I have reviewed the above note, made any necessary editorial changes, and agree with its content.  Lonia Blood, MD [facility]

## 2012-06-03 NOTE — Progress Notes (Signed)
Patient seen and examined. NG tube draining dark brown fluid. Not much abdominal pain at present. Diarrhea controlled by flexi-seal bag.  Exam: Afebrile, no acute distress. Sparse bowel sounds present, no bruit, no guarding or tenderness. Chest and heart unremarkable.  Impression: Based on my review of the chart, my feeling is that this patient's problem is probably gastric dysmotility and gastritis secondary to ischemia, and acute ischemic colitis, apparently without gangrene given the absence of tenderness, fever, or significant leukocytosis. It is unclear whether his apparent small bowel obstruction is due to mechanical back up from an obstructed: Related to swelling, edema, and dysmotility due to colitis, or whether there could be an element of primary small bowel dysmotility related to ischemia. His past history certainly sounds compatible with intestinal angina, characterized by postprandial abdominal pain and significant weight loss, and his radiographic findings to suggest involvement of the mesenteric vessels by atherosclerosis.  Plan: Proceed to endoscopy and colonoscopy. Depending on findings, consider CT angiography or possible direct mesenteric angiography with possible stenting.  Florencia Reasons, M.D. [phone]

## 2012-06-03 NOTE — Progress Notes (Signed)
Patient ID: [name] [name], male   DOB: [birth year], 79 y.o.   MRN: [id number] Subjective:  [name] [name] is a 79 yo wih hx of A-fib.  Sees Dr. Doylene [name].   Presented with abdominal pain, coffee - ground emesis, abdominal distention and syncope/ pre-syncope.  He has been found to have ischemic colitis.   CK and Troponin levels are mildly elevated.  Objective:  Vital Signs in the last 24 hours: Temp:  [97.4 F (36.3 C)-98.3 F (36.8 C)] 98.1 F (36.7 C) (03/17 0500) Pulse Rate:  [69-80] 77 (03/17 0500) Resp:  [18-27] 20 (03/17 0500) BP: (115-152)/(65-112) 144/82 mmHg (03/17 0500) SpO2:  [95 %-98 %] 96 % (03/17 0500)  Intake/Output from previous day: 03/16 0701 - 03/17 0700 In: 2500 [I.V.:1900; IV Piggyback:600] Out: 1700 [Urine:400; Emesis/NG output:1300] Intake/Output from this shift:    Physical Exam: Well appearing 79 yo man, NAD HEENT: Unremarkable Neck:  No JVD, no thyromegally, NG tube in  Lungs:  Clear with no wheezes, or rhonchi HEART:  Regular rate rhythm, no murmurs, no rubs, no clicks Abd:  soft, positive bowel sounds, no organomegally, no rebound, no guarding Ext:  2 plus pulses, no edema, no cyanosis, no clubbing Skin:  No rashes no nodules Neuro:  CN II through XII intact, motor grossly intact  Lab Results:  Recent Labs  06/03/12 0505  WBC 10.2  HGB 10.9*  PLT 237    Recent Labs  06/02/12 0912 06/03/12 0505  NA 140 138  K 3.2* 3.2*  CL 108 106  CO2 22 22  GLUCOSE 112* 107*  BUN 20 13  CREATININE 0.99 0.90   No results found for this basename: TROPONINI, CK, MB,  in the last 72 hours Hepatic Function Panel  Imaging: Dg Abd 1 View  06/01/2012  *RADIOLOGY REPORT*  Clinical Data: Obstruction  ABDOMEN - 1 VIEW  Comparison: 05/30/2012  Findings: Distended small bowel loops are scattered across the abdomen.  Minimal colonic gas.  NG tube coiled in the fundus of the stomach.  Phleboliths project over the pelvis.  No obvious free  intraperitoneal gas. There are small gas bubbles within the region of the wall of a bowel loop along the left lower quadrant.  The bubbles continue into the lumen of the bowel superiorly therefore it is not felt to represent pneumatosis.  IMPRESSION: Stable partial small bowel obstruction pattern.   Original Report Authenticated By: Jolaine Click, M.D.     Cardiac Studies: Tele - nsr with brief atrial fib, now nsr  Assessment/Plan:  1. Atrial fib - maint NSR rates 70-80   2. Elevated troponin - likely secondary to type 2, supply/demand issue. Continue to follow but no change in meds for now, unless he has symptoms.   Suspect he should have myovue at some point after his bowel issues are resolved.  No ischemic changes on ECG  Will order echo which has not been done yet   LOS: 4 days    Rickesha Veracruz,M.D. 06/03/2012, 7:55 AM

## 2012-06-03 NOTE — Progress Notes (Signed)
Patient ID: [name] [name], male   DOB: 16-Apr-1930, 79 y.o.   MRN: [id number]    Subjective: Pt feels ok.  Still with diarrhea.  No abdominal pain.  Objective: Vital signs in last 24 hours: Temp:  [97.4 F (36.3 C)-98.3 F (36.8 C)] 98 F (36.7 C) (03/17 0800) Pulse Rate:  [70-80] 72 (03/17 0800) Resp:  [19-27] 22 (03/17 0800) BP: (115-161)/(65-112) 133/95 mmHg (03/17 0800) SpO2:  [95 %-98 %] 96 % (03/17 0800) Last BM Date: 06/03/12  Intake/Output from previous day: 03/16 0701 - 03/17 0700 In: 2500 [I.V.:1900; IV Piggyback:600] Out: 1700 [Urine:400; Emesis/NG output:1300] Intake/Output this shift: Total I/O In: 300 [I.V.:300] Out: -   PE: Abd: soft, less distended, few BS, flexi-seal with diarrhea. Heart: regular Lungs: CTAB  Lab Results:   Recent Labs  06/03/12 0505  WBC 10.2  HGB 10.9*  HCT 30.7*  PLT 237   BMET  Recent Labs  06/02/12 0912 06/03/12 0505  NA 140 138  K 3.2* 3.2*  CL 108 106  CO2 22 22  GLUCOSE 112* 107*  BUN 20 13  CREATININE 0.99 0.90  CALCIUM 8.3* 8.1*   PT/INR No results found for this basename: LABPROT, INR,  in the last 72 hours CMP     Component Value Date/Time   NA 138 06/03/2012 0505   K 3.2* 06/03/2012 0505   CL 106 06/03/2012 0505   CO2 22 06/03/2012 0505   GLUCOSE 107* 06/03/2012 0505   BUN 13 06/03/2012 0505   CREATININE 0.90 06/03/2012 0505   CALCIUM 8.1* 06/03/2012 0505   PROT 6.2 03/13/2011 0500   ALBUMIN 3.2* 03/13/2011 0500   AST 17 03/13/2011 0500   ALT 11 03/13/2011 0500   ALKPHOS 89 03/13/2011 0500   BILITOT 1.1 03/13/2011 0500   GFRNONAA 79* 06/03/2012 0505   GFRAA >90 06/03/2012 0505   Lipase  No results found for this basename: lipase       Studies/Results: No results found.  Anti-infectives: Anti-infectives   Start     Dose/Rate Route Frequency Ordered Stop   05/30/12 2200  ciprofloxacin (CIPRO) IVPB 400 mg     400 mg 200 mL/hr over 60 Minutes Intravenous Every 12 hours 05/30/12 2123     05/30/12 2200  metroNIDAZOLE (FLAGYL) IVPB 500 mg     500 mg 100 mL/hr over 60 Minutes Intravenous Every 8 hours 05/30/12 2123         Assessment/Plan  1. Colitis, likely ischemic 2. Reactive ileus secondary to #1 3. Diarrhea, likely also secondary to #1  Plan: 1. NPO and NGT.  If tolerates today, will try to clamp again tomorrow. 2. GI to evaluate need for EGD, c-scope. 3. No acute surgical needs for now.   LOS: 4 days    OSBORNE,KELLY E 06/03/2012, 9:02 AM Pager: [phone] Abdomen without significant tenderness, very mild tenderness on exam. Still with some distension.  nonbloody stools and NG output.  Plan for EGD and cscope today.  Continue NPO and abx and bowel rest for presumed ischemic colitis.  He remains AF and nontender, normal WBC so doubt full thickness necrosis.  Awaiting results of endoscopy.

## 2012-06-03 NOTE — Progress Notes (Signed)
The patient's endoscopy and partial colonoscopy showed nonspecific findings (please see dictated reports and photographs).  I think that it is very possible he is having GI tract ischemia. The biopsies may help sort this out, or could indicate an alternative source of his diarrhea.  In the meantime, I would favor ice chips and sips of water with continued NG suction for the time being.  The patient exhibited some manifestations suggestive of obstructive sleep apnea, with mild desaturation and obstructive pharyngeal airway sounds with initial sedation, making [place] reluctant to sedate him more heavily. Therefore, colonoscopy could only be accomplished to the midtransverse colon, where there were a lot of changes of spasm and edema making further advancement difficult without deeper sedation.  If colonoscopic evaluation of the more proximal segments of the colon is felt to be necessary based on the patient's clinical evolution, consideration could be given to propofol sedation with MAC and possible intraprocedural intubation for airway management.  Florencia Reasons, M.D. [phone]

## 2012-06-03 NOTE — Progress Notes (Signed)
The risks of endoscopy and colonoscopy, specifically perforation and bleeding, were reviewed with the patient and his wife. He is familiar with the procedures from having had been previously. We will plan to do them later today. However, the exact time is not yet certain, due to the endoscopy unit schedule.  Please see progress note from earlier today.  Florencia Reasons, M.D. [phone]

## 2012-06-03 NOTE — Op Note (Signed)
[facility] [address] [place], [address]   COLONOSCOPY PROCEDURE REPORT  PATIENT: Fed, [name]  MR#: [id number] BIRTHDATE: January 04, 1931 , 81  yrs. old GENDER: Male ENDOSCOPIST: Bernette Redbird, MD REFERRED BY:   C. Duane Lope, MD PROCEDURE DATE:  06/03/2012 PROCEDURE:     colonoscopy (partial) with biopsies ASA CLASS: INDICATIONS:  diarrhea. Abnormal radiographic appearance of the colon on CT,? Ischemia, with associated atherosclerotic change of mesenteric vessels MEDICATIONS:    fentanyl 25 mcg, Versed 3 mg IV (including EGD meds)  DESCRIPTION OF PROCEDURE: the patient was brought from his ICU room to the [facility] cone endoscopy unit. This procedure was performed immediately following his upper endoscopy.  The Pentax adult video colonoscope was used for the procedure. No prep was administered, in view of this apparent small bowel obstruction and severe diarrhea.  Digital exam of the prostate showed it to be slightly bumpy but there was no discrete or dominant nodule.  The scope was advanced quite easily to the mid transverse colon, as measured at about 70 cm during pullback. At this point, however, further advancement could not readily be achieved and, given the patient's somewhat tenuous medical status and somewhat tenuous tolerance of sedation, I elected not to attempt to advance farther.  At the limit of the exam, in the mid transverse colon, there was marked spasm and edema of the colonic mucosa. There appeared to be some intramucosal hemorrhage as well. However, classic findings of ischemia were not seen at that location, although I suspect it might have been if I haven't been able to advance the scope farther into the inflamed segment of colon. Specifically, I did not see any exudate or erosive changes. Biopsies were obtained from this area, as well as from the descending colon for comparison and to check for evidence of microscopic  colitis.  There was some sigmoid diverticulosis but the mucosa of the sigmoid colon and descending colon appeared normal, with a normal vascular pattern, no edema, no erythema.  Despite the absence of a prep, there was no solid or formed stool in the colonic lumen. There was a moderate amount of liquid in mucoid material, most of which was able to be suctioned up. It is not felt likely that any significant lesions would have been missed.  Retroflexion was not performed in the rectum.       COMPLICATIONS: None  ENDOSCOPIC IMPRESSION:  1. Significant mucosal edema and spasm in the mid transverse colon. The etiology is not clear, but I wonder about ischemia. Efforts to advance the scope more proximally were unsuccessful within the constraints of the exam in terms of patient sedation tolerance, see above discussion.  RECOMMENDATIONS:  1. Await pathology results. Supportive care in the meantime.    _______________________________ Rosalie DoctorBernette Redbird, MD 06/03/2012 4:44 PM     PATIENT NAME:  [name] [name] MR#: [id number]

## 2012-06-03 NOTE — Progress Notes (Signed)
Addendum to previous note:  1. I will plan to talk with vascular radiology tomorrow to have them review the patient's CT scan and offer an opinion as to the likelihood that he has clinically significant mesenteric occlusive disease, and which additional studies, if any, they feel are needed to pursue that.  2. The patient is on Cipro and Flagyl but I do not see any clear indication for that from the GI perspective. Those medicines could be contributing to his diarrhea problem. From my standpoint, it would be reasonable to stop them unless there is some other indication for their use.  Florencia Reasons, M.D. [phone]

## 2012-06-03 NOTE — Op Note (Signed)
[facility] [address] [place], [address]   ENDOSCOPY PROCEDURE REPORT  PATIENT: [name] [name], [name] [name]  MR#: [id number] BIRTHDATE: Apr 19, 1930 , 81  yrs. old GENDER: Male ENDOSCOPIST:Drae Mitzel, MD REFERRED BY:  Dr. Vianne Bulls PROCEDURE DATE:  06/03/2012 PROCEDURE:      upper endoscopy with biopsies ASA CLASS: INDICATIONS:   history of "gastroparesis," abdominal pain, nausea and vomiting, food intolerance and weight loss, atherosclerosis of mesenteric vessels MEDICATION:   fentanyl 25 mcg, Versed 2 mg IV TOPICAL ANESTHETIC:    Cetacaine spray  DESCRIPTION OF PROCEDURE:   the patient was brought from his hospital room to the [facility] cone endoscopy unit and given the above sedation. He was awake throughout the procedure, but comfortable. We were reluctant to give deeper sedation because he appeared to have some obstructive sleep apnea, with fluttering pharyngeal noises, and desaturation down into the 80'swhich resolved prior to initiation of the procedure.  The Pentax adult video endoscope was passed alongside his NG tube through the mouth, entering the esophagus easily. The vocal cords looked normal.  The esophagus was endoscopically normal, without evidence of reflux esophagitis, Barrett's esophagus, varices, infection, or neoplasia. No ring, stricture, or significant hiatal hernia was appreciated.  The stomach was entered. It contained a moderately large residual, perhaps 100 cc of dark fluid. No blood or coffee-ground material was seen, however. It appeared that the tip of the NG tube terminated along the lesser curve just a few centimeters below the GE junction, so we advance to several inches at the conclusion of the procedure.  The stomach was pertinent for edema of the mucosa of the mid body of the stomach and to the rugal folds in the fundus, where there was also some erythema. No erosive changes or ulcerations were noted. There did  appear to be some prepyloric edema as well. The prepyloric area in the proximal duodenum were also quite edematous, but again without evidence of erosive change, mass effect, or ulceration. The third portion of the duodenum looked normal.  Biopsies were obtained from the edematous fundic folds prior to removal of the scope. Retroflexion the cardia was unremarkable.  The patient tolerated the procedure fairly well, with possible obstructive sleep apnea tendencies as noted above.     COMPLICATIONS: None  ENDOSCOPIC IMPRESSION:  1. Mucosal edema and slight nonerosive gastric erythema. Findings are nonspecific. 2.? Obstructive sleep apnea based on tolerance of procedure as described above  RECOMMENDATIONS:  1. Await pathology results 2. Proceed to colonoscopic evaluation    _______________________________ eSigned:  Bernette Redbird, MD 06/03/2012 4:35 PM    PATIENT NAME:  [name] MR#: [id number]

## 2012-06-04 ENCOUNTER — Encounter (HOSPITAL_COMMUNITY): Payer: Self-pay | Admitting: Gastroenterology

## 2012-06-04 ENCOUNTER — Inpatient Hospital Stay (HOSPITAL_COMMUNITY): Payer: Medicare Other

## 2012-06-04 DIAGNOSIS — I519 Heart disease, unspecified: Secondary | ICD-10-CM

## 2012-06-04 LAB — CBC
Hemoglobin: 12 g/dL — ABNORMAL LOW (ref 13.0–17.0)
MCH: 33.7 pg (ref 26.0–34.0)
MCV: 95.5 fL (ref 78.0–100.0)
RBC: 3.56 MIL/uL — ABNORMAL LOW (ref 4.22–5.81)

## 2012-06-04 LAB — BASIC METABOLIC PANEL
CO2: 20 mEq/L (ref 19–32)
Calcium: 8.8 mg/dL (ref 8.4–10.5)
Creatinine, Ser: 0.87 mg/dL (ref 0.50–1.35)
Glucose, Bld: 107 mg/dL — ABNORMAL HIGH (ref 70–99)

## 2012-06-04 LAB — AMMONIA: Ammonia: 15 umol/L (ref 11–60)

## 2012-06-04 MED ORDER — SODIUM CHLORIDE 0.9 % IV SOLN
INTRAVENOUS | Status: DC
  Administered 2012-06-04: 17:00:00 via INTRAVENOUS
  Administered 2012-06-05: 50 mL/h via INTRAVENOUS
  Administered 2012-06-05: 03:00:00 via INTRAVENOUS

## 2012-06-04 MED ORDER — SODIUM CHLORIDE 0.9 % IV BOLUS (SEPSIS)
250.0000 mL | Freq: Once | INTRAVENOUS | Status: AC
  Administered 2012-06-04: 250 mL via INTRAVENOUS

## 2012-06-04 MED ORDER — SODIUM CHLORIDE 0.9 % IV SOLN
INTRAVENOUS | Status: AC
  Administered 2012-06-04: 250 mL/h via INTRAVENOUS

## 2012-06-04 MED ORDER — HALOPERIDOL LACTATE 5 MG/ML IJ SOLN
2.0000 mg | INTRAMUSCULAR | Status: DC | PRN
  Administered 2012-06-05: 2 mg via INTRAVENOUS
  Filled 2012-06-04: qty 1

## 2012-06-04 MED ORDER — DILTIAZEM HCL 100 MG IV SOLR
5.0000 mg/h | INTRAVENOUS | Status: DC
  Administered 2012-06-04 (×2): 5 mg/h via INTRAVENOUS
  Administered 2012-06-04: 10 mg/h via INTRAVENOUS
  Filled 2012-06-04: qty 100

## 2012-06-04 NOTE — Progress Notes (Signed)
    SUBJECTIVE: Abd pain and bloating. No chest pain.   BP 120/90  Pulse 56  Temp(Src) 97.7 F (36.5 C) (Oral)  Resp 22  Ht 6\' 1"  (1.854 m)  Wt 187 lb 2.7 oz (84.9 kg)  BMI 24.7 kg/m2  SpO2 94%  Intake/Output Summary (Last 24 hours) at 06/04/12 0729 Last data filed at 06/04/12 0700  Gross per 24 hour  Intake 3725.42 ml  Output    451 ml  Net 3274.42 ml    PHYSICAL EXAM General: Well developed, well nourished, in no acute distress. Alert and oriented x 3.  Psych:  Good affect, responds appropriately Neck: No JVD. No masses noted.  Lungs: Clear bilaterally with no wheezes or rhonci noted.  Heart: Irregular, irregular with no murmurs noted. Abdomen: Bowel sounds are present. Soft, non-tender.  Extremities: No lower extremity edema.   LABS: Basic Metabolic Panel:  Recent Labs  82/95/62 0505 06/04/12 0400  NA 138 139  K 3.2* 3.7  CL 106 105  CO2 22 20  GLUCOSE 107* 107*  BUN 13 15  CREATININE 0.90 0.87  CALCIUM 8.1* 8.8  MG  --  1.7   CBC:  Recent Labs  06/03/12 0505 06/04/12 0400  WBC 10.2 13.0*  HGB 10.9* 12.0*  HCT 30.7* 34.0*  MCV 94.8 95.5  PLT 237 235   Current Meds: . ciprofloxacin  400 mg Intravenous Q12H  . metoprolol  5 mg Intravenous Q6H  . metronidazole  500 mg Intravenous Q8H  . pantoprazole (PROTONIX) IV  40 mg Intravenous Q12H  . sodium chloride  3 mL Intravenous Q12H     ASSESSMENT AND PLAN:  1. Atrial fibrillation: Back in atrial fibrillation this am. He has been started on a Cardizem drip for rate control. Currently rates 80-100 bpm. Anti-coagulation on hold with GI bleeding. He has had several CVA over the last two years so anti-coagulation will be important as an outpatient if his GI issues are resolved. Would continue IV Cardizem today.   2. Elevated troponin: Likely secondary to demand ischemia in setting of acute illness. Stress myoview normal April 2012.  No ischemic changes on ECG. No ischemic workup at this time.     Versie Fleener  3/18/20147:29 AM

## 2012-06-04 NOTE — Progress Notes (Signed)
Pt appears to become more and more confused as the day progresses. Pulling on lines and wires. Pt's wife agrees. Pt was able to verbalize month, year, location but stated it was" the 15th or 16th." No other deficits noted. Dr Butler Denmark paged and notified. Awaiting orders. Wife remains in room, pt assisted back into bed. Call light within reach. No attempts to get oob.

## 2012-06-04 NOTE — Progress Notes (Signed)
GASTROENTEROLOGY PROGRESS NOTE  Problem:   Abdominal pain, nausea and vomiting, colitis, possible partial small bowel obstruction.  Subjective: NG tube is out. He is comfortable and without nausea, vomiting, or significant abdominal pain.  Objective: Biopsies pending from yesterday's endoscopy and colonoscopy.  I had the patient's recent CT scan reviewed by the interventional radiologist (Dr. Denny Levy) with particular reference to the possibility of mesenteric vascular occlusive disease. He feels that all 3 of the major mesenteric vessels are quite patent at their origins, without significant restriction of flow. This clearly makes the likelihood of intestinal angina much lower as an explanation for his recent postprandial abdominal pain and weight loss.  Assessment: Nonspecific picture. Certainly, his colitis cannot be explained by his previous diagnosis of gastroparesis. Perhaps it was a coincidental occurrence.  Plan: Await biopsy results. If they show ischemic changes (much less likely, based on current interpretation of CT as per above discussion), we may need to reopen the idea of intestinal angina. If not, would treat as for gastroparesis with partial small bowel obstruction, with frequent small feedings once his diet is advanced.  Florencia Reasons, M.D. 06/04/2012 10:59 PM

## 2012-06-04 NOTE — Progress Notes (Signed)
Patient ID: [name] [name], male   DOB: Apr 30, 1930, 79 y.o.   MRN: [id number] 1 Day Post-Op  Subjective: Pt states he feels "average" today.  No abdominal pain.  Cont's to have diarrhea in flexi-seal.  Objective: Vital signs in last 24 hours: Temp:  [97.6 F (36.4 C)-99 F (37.2 C)] 98 F (36.7 C) (03/18 0730) Pulse Rate:  [56-136] 56 (03/18 0700) Resp:  [18-75] 22 (03/18 0700) BP: (110-159)/(48-98) 120/90 mmHg (03/18 0700) SpO2:  [89 %-99 %] 94 % (03/18 0700) Last BM Date: 06/03/12  Intake/Output from previous day: 03/17 0701 - 03/18 0700 In: 3725.4 [P.O.:60; I.V.:3165.4; IV Piggyback:500] Out: 451 [Urine:250; Stool:201] Intake/Output this shift:    PE: Abd: soft, minimal distention, +BS, NT, NGt with minimal bilious output.  Lab Results:   Recent Labs  06/03/12 0505 06/04/12 0400  WBC 10.2 13.0*  HGB 10.9* 12.0*  HCT 30.7* 34.0*  PLT 237 235   BMET  Recent Labs  06/03/12 0505 06/04/12 0400  NA 138 139  K 3.2* 3.7  CL 106 105  CO2 22 20  GLUCOSE 107* 107*  BUN 13 15  CREATININE 0.90 0.87  CALCIUM 8.1* 8.8   PT/INR No results found for this basename: LABPROT, INR,  in the last 72 hours CMP     Component Value Date/Time   NA 139 06/04/2012 0400   K 3.7 06/04/2012 0400   CL 105 06/04/2012 0400   CO2 20 06/04/2012 0400   GLUCOSE 107* 06/04/2012 0400   BUN 15 06/04/2012 0400   CREATININE 0.87 06/04/2012 0400   CALCIUM 8.8 06/04/2012 0400   PROT 6.2 03/13/2011 0500   ALBUMIN 3.2* 03/13/2011 0500   AST 17 03/13/2011 0500   ALT 11 03/13/2011 0500   ALKPHOS 89 03/13/2011 0500   BILITOT 1.1 03/13/2011 0500   GFRNONAA 80* 06/04/2012 0400   GFRAA >90 06/04/2012 0400   Lipase  No results found for this basename: lipase       Studies/Results: No results found.  Anti-infectives: Anti-infectives   Start     Dose/Rate Route Frequency Ordered Stop   05/30/12 2200  ciprofloxacin (CIPRO) IVPB 400 mg  Status:  Discontinued     400 mg 200 mL/hr over 60  Minutes Intravenous Every 12 hours 05/30/12 2123 06/04/12 0746   05/30/12 2200  metroNIDAZOLE (FLAGYL) IVPB 500 mg  Status:  Discontinued     500 mg 100 mL/hr over 60 Minutes Intravenous Every 8 hours 05/30/12 2123 06/04/12 0746       Assessment/Plan  1. Colitis, likely ischemic, biopsies pending, s/p c-scope 2. A fib  3. H/o CVA 4. Reactive ileus, secondary to #1  Plan: 1. Will try and clamp NGT today.  If he tolerates this, we can likely take it out later today or tomorrow. 2. Cont prophylactic abx therapy.  WBC up to 13K today 3. Cont flexi-seal.  diarrhea likely secondary to colitis. 4. No surgical intervention necessary right now.  Will follow.   LOS: 5 days    OSBORNE,KELLY E 06/04/2012, 7:58 AM Pager: [phone]  Minimal change. No nausea.  Abdomen is mildly distended.  Not sure what we can offer at this time.

## 2012-06-04 NOTE — Progress Notes (Signed)
Assumed care for this 7a-7p shift. Pt resting in bed, denies pain or discomfort and states "my belly is feeling better and not as firm." NG tube clamped per order. Instructed pt to notify staff if feeling nauseous. Reviewed importance of using call light for assistance or needs and notifying staff when needing to get up out of the bed. All questions answered. Will cont to monitor closely.

## 2012-06-04 NOTE — Progress Notes (Signed)
NUTRITION FOLLOW UP  Intervention:   1. If pt is expected to remain NPO >7 days, please consider TPN (now day 5) 2. If diet is able to advance, RD will add Resource Breeze po TID, each supplement provides 250 kcal and 9 grams of protein.  3. Pt is at increased risk for refeeding syndrome, recommend monitor Mag, Phos, K+ x3 days once diet or nutrition support is initiated and replete as needed.  4. RD will continue to follow    Nutrition Dx:   Inadequate oral intake related to pain and decreased appetite 2/2 gastropariesis as evidenced by 13% weight loss in 6 weeks. Ongoing   Goal:   Pt to meet >/= 90% of estimated needs. Unmet   Monitor:   Diet advance need for TPN, weight trends, labs  Assessment:   Pt remains NPO/CL diet, likely with ischemic colitis with ileus per endoscopy/colonoscopy. Pt previously had a NG tube for suction, but pt pulled this out this morning.   If pt remains unable to advance diet, may benefit from TPN. If diet is able to advance, RD will add nutrition supplements.   Given pt's prolonged NPO status, and weight loss PTA, pt is at increased risk for refeeding syndrome. Recommend monitor Phos, Mag, K+ x3 days once diet/nutrition is initiated.   Height: Ht Readings from Last 1 Encounters:  05/30/12 6\' 1"  (1.854 m)    Weight Status:   Wt Readings from Last 1 Encounters:  05/30/12 187 lb 2.7 oz (84.9 kg)  no new weight from admission   Re-estimated needs:  Kcal: 1950-2150 Protein: 105-120 gm Fluid: >/= 2 L  Skin: intact  Diet Order: NPO   Intake/Output Summary (Last 24 hours) at 06/04/12 1136 Last data filed at 06/04/12 1014  Gross per 24 hour  Intake 3113.92 ml  Output    451 ml  Net 2662.92 ml    Last BM: 3/18   Labs:   Recent Labs Lab 06/02/12 0912 06/03/12 0505 06/04/12 0400  NA 140 138 139  K 3.2* 3.2* 3.7  CL 108 106 105  CO2 22 22 20   BUN 20 13 15   CREATININE 0.99 0.90 0.87  CALCIUM 8.3* 8.1* 8.8  MG  --   --  1.7   GLUCOSE 112* 107* 107*    CBG (last 3)  No results found for this basename: GLUCAP,  in the last 72 hours  Scheduled Meds: . metoprolol  5 mg Intravenous Q6H  . pantoprazole (PROTONIX) IV  40 mg Intravenous Q12H  . sodium chloride  3 mL Intravenous Q12H    Continuous Infusions: . sodium chloride 150 mL/hr (06/04/12 1014)  . diltiazem (CARDIZEM) infusion 10 mg/hr (06/04/12 1014)    Clarene Duke RD, LDN Pager [phone] After Hours pager [phone]

## 2012-06-04 NOTE — Progress Notes (Signed)
Pt pulled NG tube out, stated "I just thought I didn't need it anymore." PA Barnetta Chapel notified and instructions received not to replace. Pt resting comfortably in chair and agreed not to pull on lines or tubes.

## 2012-06-04 NOTE — Progress Notes (Signed)
  Echocardiogram 2D Echocardiogram has been performed.  Cathie Beams 06/04/2012, 2:59 PM

## 2012-06-04 NOTE — Progress Notes (Signed)
[facility] Progress Note [name] [name] DOB: Sep 23, 1930 DOA: 05/30/2012 PCP: Miguel Aschoff, MD  Brief narrative: 79 year old male with multiple medical problems including chronic atrial fibrillation on pradaxa, stroke, hypertension, gastroparesis presented to ED with complaints of abdominal distention with associated coffee-ground emesis. The symptoms started 2 days prior with abdominal pain, diffusely with cramping sensation, level  7-8/10. Subsequently he noticed that his abdomen was getting distended. This was associated with intermittent diarrhea over the past month. He had been evaluated by his gastroenterologist (Dr. Madilyn Fireman) who advised lactose-free diet but unfortunately he saw no improvement in the diarrhea. On the morning of admission he reported feeling dizzy and lightheaded and had an episode of diarrhea. He had a syncopal episode, patient denied hitting his head and was witnessed by his wife. Per his wife, he was out for only a few seconds. When patient presented to the ED, he was noted to be in rapid A. Fib, HR in 130's, hypotensive BP in 80's, he was placed on IV fluids. He had 2 episodes of coffee-ground emesis in the ED. FOBT was positive in ED. A CT scan of the abdomen was obtained due to the abdominal distention and coffee-ground emesis which showed moderate ascites with diffuse fluid filled and dilated small bowel likely mechanical small bowel obstruction with ischemic/infectious and inflammatory etiologies.   Assessment/Plan:  Colitis with reactive PSBO -Suspected to be possible ischemic colitis - GI notes may have "intestinal angina" as evidenced by post prandial abd pain, weight loss and angiographic findings suggestive of mesenteric atherosclerosis-asking radiologist to review CT scans -colonoscopy w/o evidence of infection only edema and spasm so will dc anbx's -cont supportive care with IVF/NGT - failed attempt at  clamp NGT in past 48 hrs ago-surgery making attempt today  Hematemesis/suspected gastritis -GI following- EGD revealed mucosal edema and sl. Non erosive gastritis - continue clears -Pradaxa on hold  Acute delirium -Was not on any BZD/offending meds at home and not on any here- ? Etiology -check ammonia , RPR, and B12 level  Diarrhea/dehydration -still with high volume diarrhea -continue IVF w/ total combined rate 150/hr  Rapid atrial fibrillation/flutter -he had an episode of a-flutter 3/15 which was short lived -Cardizem gtt resumed overnight into 3/18- now on 15mg /hr -Cardiology following -also on IV Lopressor (scheduled) -keep K+ >4.0  Hypokalemia -2/2 high output diarrhea -Replace and recheck BMET in AM  Elevated cardiac enzymes -Likely due to rapid heart rate/ demand ischemia -Cards plans risk stratification testing once GI issues resolved/improved  HYPERTENSION, BENIGN SYSTEMIC -BP reasonably controlled  Gastroparesis -Has been referred to [facility]   Syncope  -Likely due to above issues full code  Recent CVA -We don't have records but he was admitted on 1/14 for CVA at [facility] - he then was admitted to [facility] on 1/29 for expressive aphasia.    Code Status: Full code Family Communication: discussed with pt Disposition Plan: Continue to follow in step down unit DVT prophylaxis: SCDs  Consultants: Gastroenterology Cardiology  Procedures: EGD (06/03/12)  1. Mucosal edema and slight nonerosive gastric erythema. Findings are nonspecific.  2.? Obstructive sleep apnea based on tolerance of procedure as described above  RECOMMENDATIONS:  1. Await pathology results  2. Proceed to colonoscopic evaluation   Colonoscopy (/06/03/12) 1. Significant mucosal edema and spasm in the mid transverse colon. The etiology is not clear, but I wonder about ischemia. Efforts to advance the scope more proximally were unsuccessful within the  constraints of the  exam in terms of patient sedation tolerance, see above discussion.  RECOMMENDATIONS:  1. Await pathology results. Supportive care in the meantime.    Antibiotics: Ciprofloxacin 3/13>> Flagyl 3/13>>  HPI/Subjective: Currently denies abd pain or nausea.  No chest pain or sob.    Objective: Blood pressure 113/62, pulse 74, temperature 97.8 F (36.6 C), temperature source Oral, resp. rate 23, height 6\' 1"  (1.854 m), weight 84.9 kg (187 lb 2.7 oz), SpO2 100.00%.  Intake/Output Summary (Last 24 hours) at 06/04/12 1244 Last data filed at 06/04/12 1200  Gross per 24 hour  Intake 3146.59 ml  Output    451 ml  Net 2695.59 ml    Exam: General: Awake alert oriented x3, No acute respiratory distress Lungs: Clear to auscultation bilaterally without wheezes or crackles, 2 L oxygen Cardiovascular: Regular rate and normal sinus rhythm without murmur gallop or rub normal S1 and S2, no peripheral edema or JVD Abdomen: Nontender, mildly distended, soft, BS high pitched, NGT to LWS Musculoskeletal: No significant cyanosis, clubbing of bilateral lower extremities Neurological: Awake and oriented x3, moves all extremities x4, cranial nerves II through XII grossly intact  Data Reviewed: Basic Metabolic Panel:  Recent Labs Lab 05/31/12 0210 06/01/12 0504 06/02/12 0912 06/03/12 0505 06/04/12 0400  NA 139 140 140 138 139  K 3.4* 3.6 3.2* 3.2* 3.7  CL 105 108 108 106 105  CO2 22 21 22 22 20   GLUCOSE 122* 85 112* 107* 107*  BUN 27* 27* 20 13 15   CREATININE 1.27 1.15 0.99 0.90 0.87  CALCIUM 9.1 8.6 8.3* 8.1* 8.8  MG  --   --   --   --  1.7   CBC:  Recent Labs Lab 05/30/12 0926 05/31/12 0210 06/03/12 0505 06/04/12 0400  WBC 12.7* 10.3 10.2 13.0*  NEUTROABS 11.2*  --   --   --   HGB 12.9* 11.5* 10.9* 12.0*  HCT 36.8* 33.5* 30.7* 34.0*  MCV 98.1 97.4 94.8 95.5  PLT 317 287 237 235   Cardiac Enzymes:  Recent Labs Lab 05/30/12 1527 05/30/12 1941 05/31/12 0209  CKTOTAL 74 95  102  CKMB 5.5* 8.3* 11.7*  TROPONINI 0.38* 0.58* 2.90*    Recent Results (from the past 240 hour(s))  URINE CULTURE     Status: None   Collection Time    05/30/12  7:42 PM      Result Value Range Status   Specimen Description URINE, RANDOM   Final   Special Requests NONE   Final   Culture  Setup Time 05/31/2012 03:28   Final   Colony Count NO GROWTH   Final   Culture NO GROWTH   Final   Report Status 06/01/2012 FINAL   Final  CLOSTRIDIUM DIFFICILE BY PCR     Status: None   Collection Time    05/30/12  9:36 PM      Result Value Range Status   C difficile by pcr NEGATIVE  NEGATIVE Final  URINE CULTURE     Status: None   Collection Time    05/31/12  2:23 AM      Result Value Range Status   Specimen Description URINE, CATHETERIZED   Final   Special Requests NONE   Final   Culture  Setup Time 05/31/2012 03:39   Final   Colony Count NO GROWTH   Final   Culture NO GROWTH   Final   Report Status 06/01/2012 FINAL   Final  MRSA PCR SCREENING  Status: None   Collection Time    05/31/12  2:46 AM      Result Value Range Status   MRSA by PCR NEGATIVE  NEGATIVE Final   Comment:            The GeneXpert MRSA Assay (FDA     approved for NASAL specimens     only), is one component of a     comprehensive MRSA colonization     surveillance program. It is not     intended to diagnose MRSA     infection nor to guide or     monitor treatment for     MRSA infections.     Studies:  Recent x-ray studies have been reviewed in detail by the Attending Physician  Scheduled Meds:  Scheduled Meds: . metoprolol  5 mg Intravenous Q6H  . pantoprazole (PROTONIX) IV  40 mg Intravenous Q12H  . sodium chloride  3 mL Intravenous Q12H   Continuous Infusions: . sodium chloride 150 mL/hr (06/04/12 1014)  . diltiazem (CARDIZEM) infusion 5 mg/hr (06/04/12 1200)    Time spent on care of this patient: 35 minutes   ELLIS,ALLISON L.ANP Pager: [phone]  [facility] Office   [phone] Pager - Text Page per Loretha Stapler as per below:  On-Call/Text Page:      [email or web address]      password [email or web address]  If 7PM-7AM, please contact night-coverage [email or web address] Password [email or web address] 06/04/2012, 12:44 PM   LOS: 5 days    I have examined the patient, reviewed the chart and modified the above note which I agree with.   Lyberti Thrush,MD [phone] 06/04/2012, 6:10 PM

## 2012-06-05 ENCOUNTER — Inpatient Hospital Stay (HOSPITAL_COMMUNITY): Payer: Medicare Other

## 2012-06-05 ENCOUNTER — Other Ambulatory Visit: Payer: Self-pay | Admitting: Oncology

## 2012-06-05 DIAGNOSIS — C785 Secondary malignant neoplasm of large intestine and rectum: Secondary | ICD-10-CM

## 2012-06-05 DIAGNOSIS — I480 Paroxysmal atrial fibrillation: Secondary | ICD-10-CM | POA: Diagnosis present

## 2012-06-05 DIAGNOSIS — J984 Other disorders of lung: Secondary | ICD-10-CM

## 2012-06-05 DIAGNOSIS — I509 Heart failure, unspecified: Secondary | ICD-10-CM

## 2012-06-05 DIAGNOSIS — C786 Secondary malignant neoplasm of retroperitoneum and peritoneum: Secondary | ICD-10-CM

## 2012-06-05 DIAGNOSIS — R471 Dysarthria and anarthria: Secondary | ICD-10-CM

## 2012-06-05 DIAGNOSIS — I6350 Cerebral infarction due to unspecified occlusion or stenosis of unspecified cerebral artery: Secondary | ICD-10-CM

## 2012-06-05 DIAGNOSIS — G9340 Encephalopathy, unspecified: Secondary | ICD-10-CM | POA: Diagnosis not present

## 2012-06-05 DIAGNOSIS — R0603 Acute respiratory distress: Secondary | ICD-10-CM | POA: Diagnosis present

## 2012-06-05 DIAGNOSIS — I5031 Acute diastolic (congestive) heart failure: Secondary | ICD-10-CM | POA: Insufficient documentation

## 2012-06-05 LAB — POCT I-STAT 3, ART BLOOD GAS (G3+)
Bicarbonate: 21.9 mEq/L (ref 20.0–24.0)
Patient temperature: 98.6
TCO2: 23 mmol/L (ref 0–100)
pH, Arterial: 7.27 — ABNORMAL LOW (ref 7.350–7.450)
pO2, Arterial: 57 mmHg — ABNORMAL LOW (ref 80.0–100.0)

## 2012-06-05 LAB — CBC
HCT: 31.6 % — ABNORMAL LOW (ref 39.0–52.0)
MCV: 95.8 fL (ref 78.0–100.0)
Platelets: 243 10*3/uL (ref 150–400)
RBC: 3.3 MIL/uL — ABNORMAL LOW (ref 4.22–5.81)
WBC: 13.1 10*3/uL — ABNORMAL HIGH (ref 4.0–10.5)

## 2012-06-05 LAB — TROPONIN I
Troponin I: 0.35 ng/mL (ref <0.30)
Troponin I: 0.33 ng/mL (ref <0.30)
Troponin I: <0.3 ng/mL (ref <0.30)

## 2012-06-05 LAB — BASIC METABOLIC PANEL
CO2: 21 mEq/L (ref 19–32)
Chloride: 107 mEq/L (ref 96–112)
Creatinine, Ser: 0.84 mg/dL (ref 0.50–1.35)
Potassium: 3.2 mEq/L — ABNORMAL LOW (ref 3.5–5.1)

## 2012-06-05 LAB — GLUCOSE, CAPILLARY: Glucose-Capillary: 150 mg/dL — ABNORMAL HIGH (ref 70–99)

## 2012-06-05 LAB — OVA AND PARASITE EXAMINATION
Ova and parasites: NONE SEEN
Special Requests: NORMAL

## 2012-06-05 LAB — HEPARIN LEVEL (UNFRACTIONATED): Heparin Unfractionated: <0.1 IU/mL — ABNORMAL LOW (ref 0.30–0.70)

## 2012-06-05 LAB — PRO B NATRIURETIC PEPTIDE: Pro B Natriuretic peptide (BNP): 4698 pg/mL — ABNORMAL HIGH (ref 0–450)

## 2012-06-05 MED ORDER — NITROGLYCERIN IN D5W 200-5 MCG/ML-% IV SOLN: 5.0000 ug/min | INTRAVENOUS | Status: DC

## 2012-06-05 MED ORDER — FUROSEMIDE 10 MG/ML IJ SOLN
20.0000 mg | Freq: Once | INTRAMUSCULAR | Status: AC
  Administered 2012-06-05: 40 mg via INTRAVENOUS
  Filled 2012-06-05: qty 4

## 2012-06-05 MED ORDER — FUROSEMIDE 10 MG/ML IJ SOLN: 40.0000 mg | Freq: Once | INTRAMUSCULAR | Status: DC

## 2012-06-05 MED ORDER — DILTIAZEM HCL 100 MG IV SOLR
5.0000 mg/h | INTRAVENOUS | Status: DC
  Administered 2012-06-05 – 2012-06-07 (×5): 10 mg/h via INTRAVENOUS
  Filled 2012-06-05 (×3): qty 100

## 2012-06-05 MED ORDER — POTASSIUM CHLORIDE 10 MEQ/100ML IV SOLN
INTRAVENOUS | Status: AC
  Filled 2012-06-05: qty 100

## 2012-06-05 MED ORDER — POTASSIUM CHLORIDE 10 MEQ/100ML IV SOLN
10.0000 meq | INTRAVENOUS | Status: AC
  Administered 2012-06-05 (×5): 10 meq via INTRAVENOUS
  Filled 2012-06-05 (×3): qty 100

## 2012-06-05 MED ORDER — HEPARIN (PORCINE) IN NACL 100-0.45 UNIT/ML-% IJ SOLN
1300.0000 [IU]/h | INTRAMUSCULAR | Status: DC
  Administered 2012-06-05: 1300 [IU]/h via INTRAVENOUS
  Administered 2012-06-05: 1000 [IU]/h via INTRAVENOUS
  Filled 2012-06-05 (×3): qty 250

## 2012-06-05 MED ORDER — FUROSEMIDE 10 MG/ML IJ SOLN
20.0000 mg | Freq: Once | INTRAMUSCULAR | Status: AC
  Administered 2012-06-05: 20 mg via INTRAVENOUS

## 2012-06-05 MED ORDER — NITROGLYCERIN IN D5W 200-5 MCG/ML-% IV SOLN
INTRAVENOUS | Status: AC
  Administered 2012-06-05: 10 ug
  Filled 2012-06-05: qty 250

## 2012-06-05 MED ORDER — HYDRALAZINE HCL 20 MG/ML IJ SOLN
10.0000 mg | Freq: Once | INTRAMUSCULAR | Status: AC
  Administered 2012-06-05: 10 mg via INTRAVENOUS
  Filled 2012-06-05: qty 1

## 2012-06-05 MED ORDER — LORAZEPAM 2 MG/ML IJ SOLN: 0.5000 mg | Freq: Once | INTRAMUSCULAR | Status: DC

## 2012-06-05 MED ORDER — FUROSEMIDE 10 MG/ML IJ SOLN
20.0000 mg | Freq: Once | INTRAMUSCULAR | Status: AC
  Administered 2012-06-05: 20 mg via INTRAVENOUS
  Filled 2012-06-05: qty 4

## 2012-06-05 NOTE — Progress Notes (Signed)
Patient ID: [name] [name], male   DOB: [birth year], 79 y.o.   MRN: [id number] 2 Days Post-Op  Subjective: Pt with flash pulmonary edema over night.  Now with slurred speech.  No abdominal pain.  No nausea with NGT out  Objective: Vital signs in last 24 hours: Temp:  [97.6 F (36.4 C)-98.4 F (36.9 C)] 98.4 F (36.9 C) (03/19 0800) Pulse Rate:  [71-105] 89 (03/19 0800) Resp:  [17-36] 28 (03/19 0800) BP: (107-189)/(62-106) 119/71 mmHg (03/19 0800) SpO2:  [93 %-100 %] 100 % (03/19 0800) FiO2 (%):  [100 %] 100 % (03/19 0615) Last BM Date: 06/04/12  Intake/Output from previous day: 03/18 0701 - 03/19 0700 In: 3446.3 [P.O.:50; I.V.:3146.3; IV Piggyback:250] Out: 2825 [Urine:2825] Intake/Output this shift: Total I/O In: 16.1 [I.V.:16.1] Out: 400 [Urine:400]  PE: Abd: soft, NT, mild pooching, +BS HEENT: slurred speech with difficulty controlling tongue Neuro: good bilateral upper extremity strength.  Equal bilaterally  Lab Results:   Recent Labs  06/04/12 0400 06/05/12 0355  WBC 13.0* 13.1*  HGB 12.0* 11.3*  HCT 34.0* 31.6*  PLT 235 243   BMET  Recent Labs  06/04/12 0400 06/05/12 0355  NA 139 141  K 3.7 3.2*  CL 105 107  CO2 20 21  GLUCOSE 107* 121*  BUN 15 15  CREATININE 0.87 0.84  CALCIUM 8.8 8.8   PT/INR No results found for this basename: LABPROT, INR,  in the last 72 hours CMP     Component Value Date/Time   NA 141 06/05/2012 0355   K 3.2* 06/05/2012 0355   CL 107 06/05/2012 0355   CO2 21 06/05/2012 0355   GLUCOSE 121* 06/05/2012 0355   BUN 15 06/05/2012 0355   CREATININE 0.84 06/05/2012 0355   CALCIUM 8.8 06/05/2012 0355   PROT 6.2 03/13/2011 0500   ALBUMIN 3.2* 03/13/2011 0500   AST 17 03/13/2011 0500   ALT 11 03/13/2011 0500   ALKPHOS 89 03/13/2011 0500   BILITOT 1.1 03/13/2011 0500   GFRNONAA 81* 06/05/2012 0355   GFRAA >90 06/05/2012 0355   Lipase  No results found for this basename: lipase       Studies/Results: Ct Head Wo  Contrast  06/04/2012  *RADIOLOGY REPORT*  Clinical Data: New onset confusion and visual hallucinations. Prior history of stroke.  CT HEAD WITHOUT CONTRAST  Technique:  Contiguous axial images were obtained from the base of the skull through the vertex without contrast.  Comparison: Unenhanced cranial CT 04/17/2012, 03/12/2011.  MRI brain 03/12/2011.  Findings: Moderate cortical and deep atrophy, unchanged.  Moderate to severe changes of small vessel disease of the white matter,, including the brainstem, unchanged.  Old lacunar strokes in the deep white matter of the right frontal lobe and in the right inferior cerebellar hemisphere, unchanged.  No mass lesion.  No midline shift.  No acute hemorrhage or hematoma.  No extra-axial fluid collections.  No evidence of acute infarction.  No significant interval change.  Near complete opacification of the left maxillary sinus with thickening of the sinus walls.  Opacification of a middle right ethmoid air cell.  Remaining visualized paranasal sinuses, bilateral mastoid air cells, and both middle ear cavities well- aerated.  Bilateral carotid siphon atherosclerosis.  Calcified pannus posterior to the dens again noted.  IMPRESSION:  1.  No acute intracranial abnormality. 2.  Stable moderate generalized atrophy, moderate to severe chronic microvascular ischemic changes of the white matter, and old lacunar strokes in the right frontal lobe and right inferior cerebellar hemisphere.  3.  Chronic left maxillary sinusitis and minimal chronic right ethmoid sinusitis.   Original Report Authenticated By: Hulan Saas, M.D.    Dg Chest Port 1 View  06/05/2012  *RADIOLOGY REPORT*  Clinical Data: Shortness of breath.  PORTABLE CHEST - 1 VIEW  Comparison: 05/31/2012  Findings: Shallow inspiration.  Bilateral pleural effusions with basilar atelectasis or infiltration bilaterally.  These changes are progressing since previous study.  Mild cardiac enlargement with borderline normal  pulmonary vascularity.  No pneumothorax. Tortuous aorta.  IMPRESSION: Shallow inspiration with increasing bilateral pleural effusions and bilateral basilar atelectasis or infiltration.   Original Report Authenticated By: Burman Nieves, M.D.     Anti-infectives: Anti-infectives   Start     Dose/Rate Route Frequency Ordered Stop   05/30/12 2200  ciprofloxacin (CIPRO) IVPB 400 mg  Status:  Discontinued     400 mg 200 mL/hr over 60 Minutes Intravenous Every 12 hours 05/30/12 2123 06/04/12 0746   05/30/12 2200  metroNIDAZOLE (FLAGYL) IVPB 500 mg  Status:  Discontinued     500 mg 100 mL/hr over 60 Minutes Intravenous Every 8 hours 05/30/12 2123 06/04/12 0746       Assessment/Plan  1. Likely ischemic colitis, with reactive ileus, resolving 2. New onset flash pulmonary edema 3. New onset slurred speech 4. H/o CVA 5. A fib  Plan: 1. Awaiting biopsies from c-scope 2. Cont NPO and get swallow eval prior to giving clear liquids 3. Agree with possible MR of the head (CT negative) and neuro eval for new onset slurred speech. 4. Cont to follow.   LOS: 6 days    OSBORNE,KELLY E 06/05/2012, 8:17 AM Pager: [phone]  Biopsies show adenocarcinoma in both stomach and colon biopsies.  Would recommend CEA level, PET scan, and oncology consultation.

## 2012-06-05 NOTE — Progress Notes (Signed)
Shift event: Mr. Jeanbaptiste is a 79 yo WM admitted 05/30/12 with SBO/ischemia of bowel. He has been followed by surgery and has been NPO.  RN had paged NP earlier in shift 2/2 pt having crackles on exam. At that time, there was no SOB, increased WOB or desaturation. CXR showed small pleural effusions. Lasix 20mg  IV x one given. IVF decreased to 50cc/hr.  Later, RN called NP to say that now, pt was having some acute resp distress with increased WOB and "stridor". Lasix 20mg  IV and ABG ordered and NP to bedside.  S: pt agrees he is very SOB. Denies pain.  O: BP high-180s, HR tachy. O2 sat normal on NRB. Resp effort greatly increased with use of accessory muscles. NO stridor noted. RR 40s. Card: irreg rhythm, tachy. Skin: pale. Neuro: awake and answers appropriately. A/P: 1. Flash pulmonary edema-Lasix total of 60mg  given. Foley placed. KVO'd IVF. Cardizem gtt on at 5. ABG showed normal CO2 with low PO2 and bipap already placed. Hydralazine and Metoprolol given with reduction in BP. PCCM called for consult. Elink doc camera'd in room and agrees with plan and stat TF to ICU. Dr. Herma Carson to room, NTG gtt started. PCCM told of echo on chart. PCCM will assume care of pt at this point.  This NP called pt's wife and updated her on status and possibility of intubation. She is on her way to the hospital. At this time, pt is a FULL CODE.  Jimmye Norman, NP [facility]

## 2012-06-05 NOTE — Progress Notes (Signed)
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 They understand stage 4 stomach cancer is treatable but not curable. Treatment consists of chemotherapy, with many options (oral or IV, single agent or multiple). In patients with a good functional status chemotherapy in this setting extends life an average of a few months. Patients with a poor functional status (as Mr Swab's currently is) are generally excluded from those studies--that data does not apply to him, and chemotherapy would certainly compromise his quality of life and might shorten his life.  Unless the patient's condition turns around clinically, my suggestion is for Hospice/Palliative care/consider home hospice vs. [facility]. I have placed a palliative team referral so patient and family can continue to discuss this (I will be out of town the next few days after tomorrow). If they want to onsider the chemotherapy option further and patient becomes ambulatory we will make an appointment for him to see one of our GI Oncology specialists (Dr Truett Perna or Dr Clelia Croft). If the patient opts for Hospice care I will be glad to be his Hospice MD, especially if he goes to [facility] (where I visit every Friday)  Full note to follow.

## 2012-06-05 NOTE — Progress Notes (Signed)
I received a call from Dr. Baruch Merl of pathology. He indicates that both the gastric and the colonic biopsies on this patient from 2 days ago show malignancy, probable adenocarcinoma.   This finding took me by surprise, because there was no obvious intragastric or intracolonic tumor, although the resistance I encountered, preventing passage of the colonoscope beyond the mid transverse colon, could now, in retrospect, be due to malignant obstruction rather than spasm and edema as I had thought at that time.   In retrospect, also, the patient's epigastric pain, long-standing food intolerance, and weight loss would be compatible with gastric malignancy rather than the intestinal angina I had previously suspected (a theory which was further disqualified by review of his CT scan by the vascular radiologist yesterday, indicating good flow through the mesenteric vessels).  Interestingly, the patient had endoscopy back in October by Dr. Dorena Cookey, which showed very prominent, hypertrophic gastric folds but multiple biopsies at that time were negative for malignancy.  The patient then had a repeat endoscopy on December 12, at which time the stomach was "normal" in appearance and no additional biopsies were obtained at that time.  I have discussed the biopsy findings with the patient and his wife. They are desirous of oncology consultation, to learn about treatment options to help them in their planning regarding his management. I have placed a call to the cancer center and am awaiting a call back to arrange consultation.  I have also spoken with Dr. Synthia Innocent who is the current attending physician for the patient. He would like to place the patient on heparin in view of his possible brain stem CVA this morning, and I feel that that is reasonably safe, while watching for the possibility of bleeding, because the patient has not had any overt bleeding, and because no ulcerative or eroded areas were noted at the  time of his endoscopy and partial colonoscopy.  I have also spoken with Barnetta Chapel, the physician assistant from the surgical team, to inform her of these findings. Although at this time the patient has his NG tube out and is not having clinical obstructive symptoms, if he should redevelop such symptoms, there may be a role for palliative surgery.  I will plan to sign off at this time, as I do not feel that there is a further role for my involvement, but I would certainly be happy to see the patient again if needed.  Florencia Reasons, M.D. [phone]

## 2012-06-05 NOTE — Progress Notes (Signed)
ANTICOAGULATION CONSULT NOTE - Initial Consult  Pharmacy Consult for heparin Indication: afib/? New stroke  Allergies  Allergen Reactions  . Lactose Intolerance (Gi)   . Penicillins Rash    Rash appeared on palms of hands and feet.    Patient Measurements: Height: 6\' 1"  (185.4 cm) Weight: 187 lb 2.7 oz (84.9 kg) IBW/kg (Calculated) : 79.9  Vital Signs: Temp: 98.9 F (37.2 C) (03/19 1124) Temp src: Axillary (03/19 1124) BP: 123/77 mmHg (03/19 1130) Pulse Rate: 74 (03/19 1130)  Labs:  Recent Labs  06/03/12 0505 06/04/12 0400 06/05/12 0355 06/05/12 0650 06/05/12 1140  HGB 10.9* 12.0* 11.3*  --   --   HCT 30.7* 34.0* 31.6*  --   --   PLT 237 235 243  --   --   CREATININE 0.90 0.87 0.84  --   --   TROPONINI  --   --   --  0.35* 0.33*    Estimated Creatinine Clearance: 80.6 ml/min (by C-G formula based on Cr of 0.84).   Medical History: Past Medical History  Diagnosis Date  . Ramsay Hunt auricular syndrome   . HTN (hypertension)   . Osteoarthritis   . Dysrhythmia 04/17/12  . Anxiety   . Stroke   . H/O Bell's palsy at age 64  . Atrial flutter   . Atrial fibrillation     Medications:  Prescriptions prior to admission  Medication Sig Dispense Refill  . b complex vitamins capsule Take 1 capsule by mouth daily.      . Cholecalciferol (VITAMIN D-3 PO) Take 1 tablet by mouth daily.       . dabigatran (PRADAXA) 150 MG CAPS Take 1 capsule (150 mg total) by mouth every 12 (twelve) hours.  60 capsule  2  . diltiazem (CARDIZEM CD) 120 MG 24 hr capsule Take 120 mg by mouth every morning.       . metoCLOPramide (REGLAN) 5 MG tablet Take 5 mg by mouth 3 (three) times daily before meals.       . Multiple Vitamins-Minerals (PRESERVISION AREDS PO) Take 1 tablet by mouth 2 (two) times daily.      . Omega-3 Fatty Acids (FISH OIL) 1000 MG CAPS Take 1 capsule by mouth daily.       Marland Kitchen omeprazole (PRILOSEC) 20 MG capsule Take 1 capsule by mouth 2 (two) times daily.       .  rosuvastatin (CRESTOR) 10 MG tablet Take 10 mg by mouth daily.      . silodosin (RAPAFLO) 8 MG CAPS capsule Take 8 mg by mouth daily with breakfast.      . sucralfate (CARAFATE) 1 G tablet Take 1 g by mouth 3 (three) times daily before meals.         Assessment: 79 yo male with afib on pradaxa PTA noted with new slurred speak this am and concern for new CVA. Patient to start heparin with no bolus.  Goal of Therapy:  Heparin level= 0.3-0.5 (keep on low end) Monitor platelets by anticoagulation protocol: Yes   Plan:  -Start heparin at 1000 units/hr (~12 units/kg/hr) -Heparin level in 8 hours and daily wth CBC daily  Harland German, Pharm D 06/05/2012 1:33 PM

## 2012-06-05 NOTE — Progress Notes (Signed)
This is a correction of my previous note: In reviewing my office records, this patient's most recent upper endoscopy prior to this admission was in October 2013, not December.  That exam showed hypertrophic, inflamed-appearing gastric folds, but biopsies at that time were negative for malignancy.   (He had had a negative endoscopy in December of 2012.)  I have reached oncology, and they will come by either today or tomorrow morning.  I have had further discussion with Dr. Colonel Bald of pathology, who feels that the stomach is more likely primary location of the tumor, rather than the colon. His reasoning for this is that the malignant cells seemed to extend all the way to the mucosal surface in the stomach, where as in the colon, there were infiltrating from the outside of the colon wall towards the mucosa.  I have the radiologist review the patient's CT scan. There is no obvious peritoneal studding to account for his ascites, which may or may not be malignant in character. No obvious tumor is seen on his admission CT, even on review with the knowledge that cancers present. There certainly is thickening in the area of the antrum of the stomach and probably also in the transverse colon region, but no discrete mass is seen.  As mentioned in my previous note, I will plan to sign off at this time, but would be happy to see the patient again if we can be of further help.  Florencia Reasons, M.D. [phone]

## 2012-06-05 NOTE — Progress Notes (Signed)
Pt transferred from [id number] to 2909.  MD at beside.  New orders given.  Vital signs stable.  Wife updated and at bedside. Will continue to monitor closely.  [name] [name]

## 2012-06-05 NOTE — Progress Notes (Signed)
SUBJECTIVE: Pt confused overnight with increased work of breathing. Given Lasix IV for acute pulmonary edema. New slurred speech this am.   BP 138/75  Pulse 92  Temp(Src) 97.6 F (36.4 C) (Oral)  Resp 28  Ht 6\' 1"  (1.854 m)  Wt 187 lb 2.7 oz (84.9 kg)  BMI 24.7 kg/m2  SpO2 100%  Intake/Output Summary (Last 24 hours) at 06/05/12 0656 Last data filed at 06/05/12 0630  Gross per 24 hour  Intake 3561.25 ml  Output   2825 ml  Net 736.25 ml    PHYSICAL EXAM General: Well developed, well nourished, in no acute distress. Alert and oriented x 3. Slurred speech.  Psych:  Good affect, responds appropriately Neck: No JVD. No masses noted.  Lungs: Clear bilaterally with no wheezes or rhonci noted.  Heart: Irregular irregular with no murmurs noted. Abdomen: Bowel sounds are present. Soft, non-tender.  Extremities: No lower extremity edema.   LABS: Basic Metabolic Panel:  Recent Labs  04/54/09 0400 06/05/12 0355  NA 139 141  K 3.7 3.2*  CL 105 107  CO2 20 21  GLUCOSE 107* 121*  BUN 15 15  CREATININE 0.87 0.84  CALCIUM 8.8 8.8  MG 1.7  --    CBC:  Recent Labs  06/04/12 0400 06/05/12 0355  WBC 13.0* 13.1*  HGB 12.0* 11.3*  HCT 34.0* 31.6*  MCV 95.5 95.8  PLT 235 243    Current Meds: . metoprolol  5 mg Intravenous Q6H  . pantoprazole (PROTONIX) IV  40 mg Intravenous Q12H  . sodium chloride  3 mL Intravenous Q12H   Echo 06/04/12: Left ventricle: The cavity size was mildly dilated. Wall thickness was normal. Systolic function was normal. The estimated ejection fraction was in the range of 50% to 55%. - Left atrium: The atrium was mildly dilated.  ASSESSMENT AND PLAN:  1. Atrial fibrillation: Atrial fibrillation this am. Rate controlled on Cardizem drip.  Currently rates 80-100 bpm. Anti-coagulation on hold with GI bleeding. He has had several CVA over the last two years so anti-coagulation will be important as an outpatient if his GI issues are resolved.  Would continue IV Cardizem today.   2. Elevated troponin: Likely secondary to demand ischemia in setting of acute illness. Stress myoview normal April 2012. No ischemic changes on ECG. No ischemic workup at this time.   3. Volume overload: Likely acute diastolic CHF. Systolic function normal on echo yesterday. No significant valvular disease. Agree with current plan of IV diuresis.   4. Slurred speech: New this am. CT head with no acute changes. He has had multiple neurological events over the last 18 months.He had a CVA on 03/12/11 and was admitted to [facility]. Echo with normal LV function. Carotid artery dopplers were normal. He described right sided arm and leg weakness. He completely recovered neurologically. He was started on Plavix in the hospital after his stroke in 2012. He was hospitalized in [place], [place] 1/26-1/28/14 for TIA vs CVA. During hospitalization (per records in paper chart) echo again showed nl LV systolic fxn 55-65%, no WMAs, or cardiac source of emboli. ASA was added to his regimen. He was discharged home but was admitted to [facility] on 04/17/12 with speech deficits. In the ED EKG showed Atrial Flutter w/ variable conduction 95bpm, nonspecific ST/T changes. He spontaneously converted to NSR. His speech returned to normal after about one hour.  His ASA and Plavix was stopped and he was started on Pradaxa for anticoagulation. Pradaxa now on hold  with GI bleeding. Agree with neurology consult.       [name] [name]  3/19/20146:56 AM

## 2012-06-05 NOTE — Progress Notes (Signed)
On call NP repaged  For pt's increasing work of breathing and possibly stridor. New order rec for lasix and Hydralazine. Pt sats observed dropping, pt switched to NRM.  On call NP on floor, RT notified re need for ABG and Bipap. Foley inserted as ordered. Pt lucid and responsive all through out episode. Pt reassured and wife called and updated. Consult made to CCM by NP, pt transferred to room 2909 on bipap with belongings and hand off given to [place], Charity fundraiser.

## 2012-06-05 NOTE — Consult Note (Signed)
PULMONARY  / CRITICAL CARE MEDICINE  Name: [name] [name] MRN: [id number] DOB: 1930/03/21    ADMISSION DATE:  05/30/2012 CONSULTATION DATE:  3-17  REFERRING MD :  triad  CHIEF COMPLAINT:  resp distress   BRIEF PATIENT DESCRIPTION:  79 yo WM with a pmh of stroke/caf on pradaxa who presented to [facility] ED 3-13 with 2 days of abd pain and distension. Admitted to hospital and underwent EGD 3-17 and multiple bxs were obtained. He developed increased wob and clinical findings consistent with pulmonary edema(crackles) and was transferred to ICU from SDU. He did not tolerate NIMVS (claustrophobia) but responded well to lasix and NTG. Note he has speech changes (dysarthia and thick tongue) CT head 3-19 with no new changes. PCCM asked to assume care. SIGNIFICANT EVENTS / STUDIES:  3-19 flash pulmoary edema  LINES / TUBES:   CULTURES: 3-14 uc>>neg 3-13 cdif>>neg  ANTIBIOTICS:   HISTORY OF PRESENT ILLNESS:    79 yo WM with a pmh of stroke/caf on pradaxa who presented to [facility] ED 3-13 with 2 days of abd pain and distension. Admitted to hospital and underwent EGD 3-17 and multiple bxs were obtained. He developed increased wob and clinical findings consistent with pulmonary edema(crackles) and was transferred to ICU from SDU. He did not tolerate NIMVS (claustrophobia) but responded well to lasix and NTG. Note he has speech changes (dysarthia and thick tongue) CT head 3-19 with no new changes. PCCM asked to assume care. PAST MEDICAL HISTORY :  Past Medical History  Diagnosis Date  . Ramsay Hunt auricular syndrome   . HTN (hypertension)   . Osteoarthritis   . Dysrhythmia 04/17/12  . Anxiety   . Stroke   . H/O Bell's palsy at age 60  . Atrial flutter   . Atrial fibrillation    Past Surgical History  Procedure Laterality Date  . Hernia repair    . Vasectomy    . Prostate biopsies      per Vonita Moss  . Cataract extraction      SE eye  . Colonoscopy N/A 06/03/2012    Procedure: COLONOSCOPY;   Surgeon: Florencia Reasons, MD;  Location: [facility] ENDOSCOPY;  Service: Endoscopy;  Laterality: N/A;  . Esophagogastroduodenoscopy N/A 06/03/2012    Procedure: ESOPHAGOGASTRODUODENOSCOPY (EGD);  Surgeon: Florencia Reasons, MD;  Location: [facility] ENDOSCOPY;  Service: Endoscopy;  Laterality: N/A;   Prior to Admission medications   Medication Sig Start Date End Date Taking? Authorizing Provider  b complex vitamins capsule Take 1 capsule by mouth daily.   Yes Historical Provider, MD  Cholecalciferol (VITAMIN D-3 PO) Take 1 tablet by mouth daily.    Yes Historical Provider, MD  dabigatran (PRADAXA) 150 MG CAPS Take 1 capsule (150 mg total) by mouth every 12 (twelve) hours. 04/18/12  Yes Meredeth Ide, MD  diltiazem (CARDIZEM CD) 120 MG 24 hr capsule Take 120 mg by mouth every morning.    Yes Historical Provider, MD  metoCLOPramide (REGLAN) 5 MG tablet Take 5 mg by mouth 3 (three) times daily before meals.    Yes Historical Provider, MD  Multiple Vitamins-Minerals (PRESERVISION AREDS PO) Take 1 tablet by mouth 2 (two) times daily.   Yes Historical Provider, MD  Omega-3 Fatty Acids (FISH OIL) 1000 MG CAPS Take 1 capsule by mouth daily.    Yes Historical Provider, MD  omeprazole (PRILOSEC) 20 MG capsule Take 1 capsule by mouth 2 (two) times daily.  05/01/10  Yes Historical Provider, MD  rosuvastatin (CRESTOR) 10 MG tablet Take  10 mg by mouth daily.   Yes Historical Provider, MD  silodosin (RAPAFLO) 8 MG CAPS capsule Take 8 mg by mouth daily with breakfast.   Yes Historical Provider, MD  sucralfate (CARAFATE) 1 G tablet Take 1 g by mouth 3 (three) times daily before meals.    Yes Historical Provider, MD   Allergies  Allergen Reactions  . Lactose Intolerance (Gi)   . Penicillins Rash    Rash appeared on palms of hands and feet.    FAMILY HISTORY:  Family History  Problem Relation Age of Onset  . Cancer Father   . Diabetes Father    SOCIAL HISTORY:  reports that he quit smoking about 27 years ago. He has never  used smokeless tobacco. He reports that he drinks about 3.5 ounces of alcohol per week. He reports that he does not use illicit drugs.  REVIEW OF SYSTEMS:  NA  SUBJECTIVE:   VITAL SIGNS: Temp:  [97.6 F (36.4 C)-98.4 F (36.9 C)] 97.6 F (36.4 C) (03/19 0400) Pulse Rate:  [71-105] 92 (03/19 0630) Resp:  [20-36] 28 (03/19 0630) BP: (113-189)/(62-106) 138/75 mmHg (03/19 0630) SpO2:  [93 %-100 %] 100 % (03/19 0630) FiO2 (%):  [100 %] 100 % (03/19 0615) HEMODYNAMICS:   VENTILATOR SETTINGS: Vent Mode:  [-]  FiO2 (%):  [100 %] 100 % INTAKE / OUTPUT: Intake/Output     03/18 0701 - 03/19 0700 03/19 0701 - 03/20 0700   P.O. 50    I.V. (mL/kg) 3146.3 (37.1)    IV Piggyback 250    Total Intake(mL/kg) 3446.3 (40.6)    Urine (mL/kg/hr) 2825 (1.4)    Stool     Total Output 2825     Net +621.3          Urine Occurrence 1 x      PHYSICAL EXAMINATION: General:  EWF NAD at rest Neuro:  MAE x 4. Speech thick(new),  HEENT: No LAN, neck soreness noted Cardiovascular:  HSR RRR periods of Afib Lungs: bibasilar crackles Abdomen:  Non distended, +bs, flexiseal with liquid stool. Musculoskeletal:  intact Skin:  warm  LABS:  Recent Labs Lab 05/30/12 1527 05/30/12 1545 05/30/12 1941 05/31/12 0209  06/03/12 0505 06/04/12 0400 06/05/12 0355 06/05/12 0552  HGB  --   --   --   --   < > 10.9* 12.0* 11.3*  --   WBC  --   --   --   --   < > 10.2 13.0* 13.1*  --   PLT  --   --   --   --   < > 237 235 243  --   NA  --   --   --   --   < > 138 139 141  --   K  --   --   --   --   < > 3.2* 3.7 3.2*  --   CL  --   --   --   --   < > 106 105 107  --   CO2  --   --   --   --   < > 22 20 21   --   GLUCOSE  --   --   --   --   < > 107* 107* 121*  --   BUN  --   --   --   --   < > 13 15 15   --   CREATININE  --   --   --   --   < >  0.90 0.87 0.84  --   CALCIUM  --   --   --   --   < > 8.1* 8.8 8.8  --   MG  --   --   --   --   --   --  1.7  --   --   LATICACIDVEN  --  1.07  --   --   --   --    --   --   --   TROPONINI 0.38*  --  0.58* 2.90*  --   --   --   --   --   PHART  --   --   --   --   --   --   --   --  7.270*  PCO2ART  --   --   --   --   --   --   --   --  47.7*  PO2ART  --   --   --   --   --   --   --   --  57.0*  < > = values in this interval not displayed.  Recent Labs Lab 06/05/12 0555  GLUCAP 150*    Imaging: Ct Head Wo Contrast  06/04/2012  *RADIOLOGY REPORT*  Clinical Data: New onset confusion and visual hallucinations. Prior history of stroke.  CT HEAD WITHOUT CONTRAST  Technique:  Contiguous axial images were obtained from the base of the skull through the vertex without contrast.  Comparison: Unenhanced cranial CT 04/17/2012, 03/12/2011.  MRI brain 03/12/2011.  Findings: Moderate cortical and deep atrophy, unchanged.  Moderate to severe changes of small vessel disease of the white matter,, including the brainstem, unchanged.  Old lacunar strokes in the deep white matter of the right frontal lobe and in the right inferior cerebellar hemisphere, unchanged.  No mass lesion.  No midline shift.  No acute hemorrhage or hematoma.  No extra-axial fluid collections.  No evidence of acute infarction.  No significant interval change.  Near complete opacification of the left maxillary sinus with thickening of the sinus walls.  Opacification of a middle right ethmoid air cell.  Remaining visualized paranasal sinuses, bilateral mastoid air cells, and both middle ear cavities well- aerated.  Bilateral carotid siphon atherosclerosis.  Calcified pannus posterior to the dens again noted.  IMPRESSION:  1.  No acute intracranial abnormality. 2.  Stable moderate generalized atrophy, moderate to severe chronic microvascular ischemic changes of the white matter, and old lacunar strokes in the right frontal lobe and right inferior cerebellar hemisphere. 3.  Chronic left maxillary sinusitis and minimal chronic right ethmoid sinusitis.   Original Report Authenticated By: Hulan Saas, M.D.     Dg Chest Port 1 View  06/05/2012  *RADIOLOGY REPORT*  Clinical Data: Shortness of breath.  PORTABLE CHEST - 1 VIEW  Comparison: 05/31/2012  Findings: Shallow inspiration.  Bilateral pleural effusions with basilar atelectasis or infiltration bilaterally.  These changes are progressing since previous study.  Mild cardiac enlargement with borderline normal pulmonary vascularity.  No pneumothorax. Tortuous aorta.  IMPRESSION: Shallow inspiration with increasing bilateral pleural effusions and bilateral basilar atelectasis or infiltration.   Original Report Authenticated By: Burman Nieves, M.D.    Principal Problem:   Acute respiratory distress Active Problems:   Gastroparesis   Atrial fibrillation, rapid   GI bleed   Syncope   Acute encephalopathy   Dysarthria, acute   Paroxysmal a-fib/flutter    ASSESSMENT / PLAN:  PULMONARY A:Resp distress secondary to  pulmonary edema, ? New stroke with dysarthria. Unable to tolerate NIMVS P:   -Diuresis as tolerated -BP control -swallow evaluation -If needed intubation  CARDIOVASCULAR A: HTN/Flash pulmonary edema/CAF P:  -dc ntg -diuresis as needed -may need cvl to guide fluids -kvo fluids -dc Cardizem drip   RENAL Lab Results  Component Value Date   CREATININE 0.84 06/05/2012   CREATININE 0.87 06/04/2012   CREATININE 0.90 06/03/2012    A:  No acute issue P:     GASTROINTESTINAL A:  GIB, ? Of gut ischemia P:   -per GI/Vascular surgery -needs swallow eval  HEMATOLOGIC  Recent Labs  06/04/12 0400 06/05/12 0355  HGB 12.0* 11.3*    A: Concern for blood loss from gib P:  -monitor hgb  INFECTIOUS A:  No acute issue P:     ENDOCRINE  Recent Labs Lab 06/03/12 0505 06/04/12 0400 06/05/12 0355  K 3.2* 3.7 3.2*     A:  Hypokalemia P:   -replete K+  NEUROLOGIC A:  AMS/Hx of stroke/New speech changes 3-19/?new stroke P:   -ct hed no new changes -Neuro consult , may need MRI(note he is claustrophobic)   -hold anticoagulation  TODAY'S SUMMARY:  79 yo WM with a pmh of stroke/caf on pradaxa who presented to [facility] ED 3-13 with 2 days of abd pain and distension. Admitted to hospital and underwent EGD 3-17 and multiple bxs were obtained. He developed increased wob and clinical findings consistent with pulmonary edema(crackles) and was transferred to ICU from SDU. He did not tolerate NIMVS (claustrophobia) but responded well to lasix and NTG. Note he has speech changes (dysarthia and thick tongue) CT head 3-19 with no new changes. PCCM asked to assume care while in ICU.   Brett Canales Minor ACNP Adolph Pollack PCCM Pager [phone] till 3 pm If no answer page [phone] 06/05/2012, 7:35 AM  PCCM Attending:  I have interviewed and examined the patient and reviewed the database. I have formulated the assessment and plan as reflected in the note above with amendments made by me.   I have discussed with Dr Matthias Hughs. [name] [name] has malignancy noted on biopsies from both the stomach and colon. He is to be seen by both [facility] and [facility] to address treatment options. With regard to his respiratory distress, this is most likely attributable to pulmonary edema caused by PAF (now back in sinus). Respiratory symptoms much improved after diuresis. His dysarthria and AMS are worrisome for acute brain stem TIA - symptoms are much improved. CT head negative for acute changes. MRI might be warranted in near future. For now, after speaking to Dr Matthias Hughs to ensure it can be done safely, I have ordered heparin infusion and if he tolerates this, we should consider resumption of pradaxa. I have discussed plan in detail with pt and his wife.  Billy Fischer, MD;  PCCM service; Mobile [phone]

## 2012-06-05 NOTE — Progress Notes (Signed)
eLink Physician-Brief Progress Note Patient Name: [name] [name] DOB: 14-May-1930 MRN: [id number]  Date of Service  06/05/2012   HPI/Events of Note  Insomnia. RN says patient will not tolerate placing ng tube   eICU Interventions  Ativan 0.5mg  x 1. Will have to accept down side of delirum getting worse   Intervention Category Intermediate Interventions: Other:  Makeya Hilgert 06/05/2012, 11:27 PM

## 2012-06-05 NOTE — Progress Notes (Signed)
ANTICOAGULATION CONSULT NOTE - Initial Consult  Pharmacy Consult for heparin Indication: afib/? New stroke  Allergies  Allergen Reactions  . Lactose Intolerance (Gi)   . Penicillins Rash    Rash appeared on palms of hands and feet.    Patient Measurements: Height: 6\' 1"  (185.4 cm) Weight: 187 lb 2.7 oz (84.9 kg) IBW/kg (Calculated) : 79.9  Vital Signs: Temp: 97.9 F (36.6 C) (03/19 2000) Temp src: Oral (03/19 2000) BP: 145/74 mmHg (03/19 2230) Pulse Rate: 83 (03/19 2230)  Labs:  Recent Labs  06/03/12 0505 06/04/12 0400 06/05/12 0355 06/05/12 0650 06/05/12 1140 06/05/12 1709 06/05/12 2221  HGB 10.9* 12.0* 11.3*  --   --   --   --   HCT 30.7* 34.0* 31.6*  --   --   --   --   PLT 237 235 243  --   --   --   --   HEPARINUNFRC  --   --   --   --   --   --  <0.10*  CREATININE 0.90 0.87 0.84  --   --   --   --   TROPONINI  --   --   --  0.35* 0.33* <0.30  --     Estimated Creatinine Clearance: 80.6 ml/min (by C-G formula based on Cr of 0.84).   Medical History: Past Medical History  Diagnosis Date  . Ramsay Hunt auricular syndrome   . HTN (hypertension)   . Osteoarthritis   . Dysrhythmia 04/17/12  . Anxiety   . Stroke   . H/O Bell's palsy at age 70  . Atrial flutter   . Atrial fibrillation     Medications:  Prescriptions prior to admission  Medication Sig Dispense Refill  . b complex vitamins capsule Take 1 capsule by mouth daily.      . Cholecalciferol (VITAMIN D-3 PO) Take 1 tablet by mouth daily.       . dabigatran (PRADAXA) 150 MG CAPS Take 1 capsule (150 mg total) by mouth every 12 (twelve) hours.  60 capsule  2  . diltiazem (CARDIZEM CD) 120 MG 24 hr capsule Take 120 mg by mouth every morning.       . metoCLOPramide (REGLAN) 5 MG tablet Take 5 mg by mouth 3 (three) times daily before meals.       . Multiple Vitamins-Minerals (PRESERVISION AREDS PO) Take 1 tablet by mouth 2 (two) times daily.      . Omega-3 Fatty Acids (FISH OIL) 1000 MG CAPS Take 1  capsule by mouth daily.       Marland Kitchen omeprazole (PRILOSEC) 20 MG capsule Take 1 capsule by mouth 2 (two) times daily.       . rosuvastatin (CRESTOR) 10 MG tablet Take 10 mg by mouth daily.      . silodosin (RAPAFLO) 8 MG CAPS capsule Take 8 mg by mouth daily with breakfast.      . sucralfate (CARAFATE) 1 G tablet Take 1 g by mouth 3 (three) times daily before meals.         Assessment: 79 yo male with afib on pradaxa PTA noted with new slurred speak this am and concern for new CVA. Patient to start heparin with no bolus.  Heparin level (< 0.1) is below-goal on 1000 units/hr. No problem with line / infusion and no bleeding per RN.    Goal of Therapy:  Heparin level= 0.3-0.5 (keep on low end) Monitor platelets by anticoagulation protocol: Yes   Plan:  1. Increase IV heparin to 1300 units/hr.  2. Heparin level in 8 hours.   Lorre Munroe, Pharm D 06/05/2012 11:41 PM

## 2012-06-05 NOTE — Progress Notes (Signed)
ID: [name]   DOB: 05-05-1930  MR#: [id number]  WUJ#:[id number]  PCP: Miguel Aschoff, MD SU:  OTHER MD: Dorena Cookey, Molly Maduro Buccini   HISTORY OF PRESENT ILLNESS: Mr Nettle tells me he has been "fighting gastroparesis" for about a year. Evaluation by Dr Madilyn Fireman included upper endoscopy October 2013 (with negative biopsies, per Dr Buccini's report) and again December 2013, without obvious malignancy shown. More recently the patient had a syncopal episode at home and was admitted, with symptoms of bowel obstruction. Scans showed bowel obstruction with thickened large bowel and a dignosis of ischemic colitis was considered. However upper and lower endoscopies 06/03/2012 showed   Patient Name: [name] [name], [name] [name] Accession #: [id number] DOB: 27-Aug-1930 Age: 79 Gender: M Client Name Eligha Bridegroom. [facility] Collected Date: 06/03/2012 Received Date: 06/03/2012 Physician: Bernette Redbird Chart #: MRN # : [id number] Physician cc: Thad Ranger, MD Race:W Visit #: [id number] REPORT OF SURGICAL PATHOLOGY FINAL DIAGNOSIS Diagnosis 1. Stomach, biopsy - MALIGNANT CELLS PRESENT, MOST CONSISTENT WITH DIFFUSE TYPE ADENOCARCINOMA. - BENIGN SMALL BOWEL TYPE MUCOSA. - SEE COMMENT. 2. Colon, biopsy, mid edematous tissue - MALIGNANT CELLS PRESENT. - SEE COMMENT. 3. Colon, biopsy, left - BENIGN COLONIC MUCOSA. - THERE IS NO EVIDENCE OF MALIGNANCY. Microscopic Comment 1. The malignant cells are highlighted by a cytokeratin AE1/AE3 immunohistochemical stain. A Warthin Starry stain is negative for the presence of Helicobacter pylori organisms. Dr. [place] Rund has reviewed parts 1 and 2 and concurs with this interpretation. The case was discussed with Dr. Matthias Hughs on 06/05/2012. 2. The malignant cells are highlighted by a cytokeratin AE1/AE3 immunohistochemical stain. The cytologic features are similar to those seen in part 1. The features suggest a metastatic lesion rather than a primary lesion. Pecola Leisure  MD Pathologist, Electronic Signature (Case signed 06/05/2012).  Oncology was consulted to discuss these findings and clarify prognosis treatment options to patient.  INTERVAL HISTORY: I met with the patient, his wife Harriett Sine and his son Debbe Bales 06/05/2012.  REVIEW OF SYSTEMS: He denies pain. He has lost upwards of 30 pounds over the last few months. Just 2 weeks ago Harriett Sine tells me he was moving boxes full of books and had an excellent functional status. Tonight he denies nausea or vomiting (but is npo), head aches or visual changes, dizzyness or confusion (family agrees), SOB or cough.  PAST MEDICAL HISTORY: Past Medical History  Diagnosis Date  . Ramsay Hunt auricular syndrome   . HTN (hypertension)   . Osteoarthritis   . Dysrhythmia 04/17/12  . Anxiety   . Stroke   . H/O Bell's palsy at age 26  . Atrial flutter   . Atrial fibrillation     PAST SURGICAL HISTORY: Past Surgical History  Procedure Laterality Date  . Hernia repair    . Vasectomy    . Prostate biopsies      per Vonita Moss  . Cataract extraction      SE eye  . Colonoscopy N/A 06/03/2012    Procedure: COLONOSCOPY;  Surgeon: Florencia Reasons, MD;  Location[facility] END[facility]COPY;  Service: Endoscopy;  Laterality: N/A;  . Esophagogastroduodenoscopy N/A 06/03/2012    Procedure: ESOPHAGOGASTRODUODENOSCOPY (EGD);  Surgeon: Florencia Reasons, MD;  Location: [facility] ENDOSCOPY;  Service: Endoscopy;  Laterality: N/A;    FAMILY HISTORY Family History  Problem Relation Age of Onset  . Cancer Father   . 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. His two children from his first marriage are Thrall, who  works in a bank, and a daughter who works in Airline pilot. Both live in [place]. The patient has 4 grandchildren. Harriett Sine had no children of her own   HEALTH MAINTENANCE: History  Substance Use Topics  . Smoking status: Former Smoker    Quit date: 06/22/1984  . Smokeless tobacco: Never Used   . Alcohol Use: 3.5 oz/week    7 drink(s) per week     Comment: 1 glass of wine a day     Allergies  Allergen Reactions  . Lactose Intolerance (Gi)   . Penicillins Rash    Rash appeared on palms of hands and feet.    Current Facility-Administered Medications  Medication Dose Route Frequency Provider Last Rate Last Dose  . acetaminophen (TYLENOL) suppository 650 mg  650 mg Rectal Q6H PRN Ripudeep K Rai, MD      . diltiazem (CARDIZEM) 100 mg in dextrose 5 % 100 mL infusion  5-15 mg/hr Intravenous Continuous Merwyn Katos, MD 10 mL/hr at 06/05/12 1502 10 mg/hr at 06/05/12 1502  . heparin ADULT infusion 100 units/mL (25000 units/250 mL)  1,000 Units/hr Intravenous Continuous Benny Lennert, RPH 10 mL/hr at 06/05/12 1506 1,000 Units/hr at 06/05/12 1506  . metoprolol (LOPRESSOR) injection 5 mg  5 mg Intravenous Q6H Calvert Cantor, MD   5 mg at 06/05/12 1742  . ondansetron (ZOFRAN) injection 4 mg  4 mg Intravenous Q6H PRN Ripudeep Jenna Luo, MD   4 mg at 06/02/12 0808  . pantoprazole (PROTONIX) injection 40 mg  40 mg Intravenous Q12H Ripudeep K Rai, MD   40 mg at 06/05/12 1102  . sodium chloride 0.9 % injection 3 mL  3 mL Intravenous Q12H Ripudeep K Rai, MD   3 mL at 06/05/12 1103    OBJECTIVE: elderly white male examined in bed Filed Vitals:   06/05/12 1800  BP: 123/67  Pulse: 70  Temp:   Resp: 18     Body mass index is 24.7 kg/(m^2).    ECOG FS:  Sclerae unicteric Oropharynx clear, slightly dry No cervical or supraclavicular adenopathy Lungs no rales or rhonchi, auscultated anteriorly Heart regular rate and rhythm Abd soft, nontender to mild palpation, positive BS MSK PAS hose in place Neuro: nonfocal, well oriented, affect appropriate and pleasant  LAB RESULTS: Lab Results  Component Value Date   WBC 13.1* 06/05/2012   NEUTROABS 11.2* 05/30/2012   HGB 11.3* 06/05/2012   HCT 31.6* 06/05/2012   MCV 95.8 06/05/2012   PLT 243 06/05/2012    @LASTCHEMISTRY @  No results found  for this basename: LABCA2    No components found with this basename: [id number]   Results for TYMERE, DEPUY (MRN [id number]) as of 06/05/2012 19:22  Ref. Range 06/05/2012 03:55  Sodium Latest Range: 135-145 mEq/L 141  Potassium Latest Range: 3.5-5.1 mEq/L 3.2 (L)  Chloride Latest Range: 96-112 mEq/L 107  CO2 Latest Range: 19-32 mEq/L 21  BUN Latest Range: 6-23 mg/dL 15  Creatinine Latest Range: 0.50-1.35 mg/dL 4.78  Calcium Latest Range: 8.4-10.5 mg/dL 8.8  GFR calc non Af Amer Latest Range: >90 mL/min 81 (L)  GFR calc Af Amer Latest Range: >90 mL/min >90  Glucose Latest Range: 70-99 mg/dL 295 (H)  WBC Latest Range: 4.0-10.5 K/uL 13.1 (H)  RBC Latest Range: 4.22-5.81 MIL/uL 3.30 (L)  Hemoglobin Latest Range: 13.0-17.0 g/dL 62.1 (L)  HCT Latest Range: 39.0-52.0 % 31.6 (L)  MCV Latest Range: 78.0-100.0 fL 95.8  MCH Latest Range: 26.0-34.0 pg 34.2 (H)  MCHC Latest Range: 30.0-36.0  g/dL 16.1  RDW Latest Range: 11.5-15.5 % 13.2  Platelets Latest Range: 150-400 K/uL 243    No results found for this basename: INR,  in the last 168 hours  Urinalysis    Component Value Date/Time   COLORURINE AMBER* 05/30/2012 1942   APPEARANCEUR CLEAR 05/30/2012 1942   LABSPEC 1.015 05/30/2012 1942   PHURINE 6.0 05/30/2012 1942   GLUCOSEU NEGATIVE 05/30/2012 1942   HGBUR TRACE* 05/30/2012 1942   BILIRUBINUR NEGATIVE 05/30/2012 1942   KETONESUR 40* 05/30/2012 1942   PROTEINUR 100* 05/30/2012 1942   UROBILINOGEN 0.2 05/30/2012 1942   NITRITE NEGATIVE 05/30/2012 1942   LEUKOCYTESUR NEGATIVE 05/30/2012 1942    STUDIES: Dg Abd 1 View  06/01/2012  *RADIOLOGY REPORT*  Clinical Data: Obstruction  ABDOMEN - 1 VIEW  Comparison: 05/30/2012  Findings: Distended small bowel loops are scattered across the abdomen.  Minimal colonic gas.  NG tube coiled in the fundus of the stomach.  Phleboliths project over the pelvis.  No obvious free intraperitoneal gas. There are small gas bubbles within the region of the wall of a  bowel loop along the left lower quadrant.  The bubbles continue into the lumen of the bowel superiorly therefore it is not felt to represent pneumatosis.  IMPRESSION: Stable partial small bowel obstruction pattern.   Original Report Authenticated By: Jolaine Click, M.D.    Ct Head Wo Contrast  06/04/2012  *RADIOLOGY REPORT*  Clinical Data: New onset confusion and visual hallucinations. Prior history of stroke.  CT HEAD WITHOUT CONTRAST  Technique:  Contiguous axial images were obtained from the base of the skull through the vertex without contrast.  Comparison: Unenhanced cranial CT 04/17/2012, 03/12/2011.  MRI brain 03/12/2011.  Findings: Moderate cortical and deep atrophy, unchanged.  Moderate to severe changes of small vessel disease of the white matter,, including the brainstem, unchanged.  Old lacunar strokes in the deep white matter of the right frontal lobe and in the right inferior cerebellar hemisphere, unchanged.  No mass lesion.  No midline shift.  No acute hemorrhage or hematoma.  No extra-axial fluid collections.  No evidence of acute infarction.  No significant interval change.  Near complete opacification of the left maxillary sinus with thickening of the sinus walls.  Opacification of a middle right ethmoid air cell.  Remaining visualized paranasal sinuses, bilateral mastoid air cells, and both middle ear cavities well- aerated.  Bilateral carotid siphon atherosclerosis.  Calcified pannus posterior to the dens again noted.  IMPRESSION:  1.  No acute intracranial abnormality. 2.  Stable moderate generalized atrophy, moderate to severe chronic microvascular ischemic changes of the white matter, and old lacunar strokes in the right frontal lobe and right inferior cerebellar hemisphere. 3.  Chronic left maxillary sinusitis and minimal chronic right ethmoid sinusitis.   Original Report Authenticated By: Hulan Saas, M.D.    Ct Abdomen Pelvis W Contrast  05/30/2012  *RADIOLOGY REPORT*  Clinical  Data: Near-syncope.  Abdominal pain with bloating and diarrhea.  CT ABDOMEN AND PELVIS WITH CONTRAST  Technique:  Multidetector CT imaging of the abdomen and pelvis was performed following the standard protocol during bolus administration of intravenous contrast.  Contrast: 80mL OMNIPAQUE IOHEXOL 300 MG/ML  SOLN  Comparison: None.  Findings: Lung bases show dependent atelectasis bilaterally.  Heart size normal.  No pericardial effusion.  There may be trace left pleural fluid.  Distal esophagus is dilated and fluid-filled.  Intrahepatic biliary duct dilatation.  Extrahepatic bile duct is somewhat difficult to follow but is likely within normal  limits. Intermediate attenuation material layers in the gallbladder.  Right kidney is unremarkable.  Low attenuation lesions in the left kidney measure up to 1.5 cm and are likely cysts.  Spleen, pancreas, stomach and duodenum are unremarkable.  There is rather diffuse distention of fluid-filled small bowel, without a discrete transition point.  The cecum and proximal ascending colon are fluid filled and dilated as well.  However, there is a fairly long segment of under distention of the colon with noticeable wall thickening involving the distal ascending, transverse and proximal descending portions.  The distal descending and rectosigmoid colon are minimally dilated and contains fluid.  Moderate ascites.  Mesenteric edema.  No definite pathologically enlarged lymph nodes.  Atherosclerotic calcification of the arterial vasculature.  Prostate is enlarged.  No worrisome lytic or sclerotic lesions.  IMPRESSION:  1.  Long segment of nondistended and thickened colon, involving the ascending, transverse and descending portions, with diffuse fluid filled and dilated small bowel.  Ischemic and infectious/inflammatory etiologies involving the colon are considered, with resultant mechanical small bowel obstruction. Critical Value/emergent results were called by telephone at the time of  interpretation on 05/30/2012 at 1225 hours to Dr. Radford Pax, who verbally acknowledged these results. 2.  Tiny left pleural effusion and moderate ascites. 3.  Intrahepatic biliary duct dilatation, of uncertain etiology. 4.  Question gallbladder sludge. 5.  Prostate enlargement.   Original Report Authenticated By: Leanna Battles, M.D.    Dg Chest Port 1 View  06/05/2012  *RADIOLOGY REPORT*  Clinical Data: Shortness of breath.  PORTABLE CHEST - 1 VIEW  Comparison: 05/31/2012  Findings: Shallow inspiration.  Bilateral pleural effusions with basilar atelectasis or infiltration bilaterally.  These changes are progressing since previous study.  Mild cardiac enlargement with borderline normal pulmonary vascularity.  No pneumothorax. Tortuous aorta.  IMPRESSION: Shallow inspiration with increasing bilateral pleural effusions and bilateral basilar atelectasis or infiltration.   Original Report Authenticated By: Burman Nieves, M.D.    Dg Chest Port 1 View  05/31/2012  *RADIOLOGY REPORT*  Clinical Data: Ascites.  Pleural effusion.  PORTABLE CHEST - 1 VIEW  Comparison: 05/10/2012.  Findings: Nasogastric tube gastric fundus level.  Bibasilar atelectasis.  Poor inspiration.  Limited evaluation lung bases.  Central pulmonary vascular prominence.  Tortuous aorta.  No gross pneumothorax.  Heart size top normal.  IMPRESSION: Poor inspiration with bibasilar atelectatic changes.  Limited evaluation lung bases.  Calcified tortuous aorta.  Central pulmonary vascular prominence.   Original Report Authenticated By: Lacy Duverney, M.D.    Dg Chest Port 1 View  05/10/2012  *RADIOLOGY REPORT*  Clinical Data: Low back pain  PORTABLE CHEST - 1 VIEW  Comparison: 04/17/2012  Findings: Cardiomediastinal silhouette is stable.  No acute infiltrate or pleural effusion.  No pulmonary edema.  Bony thorax is stable.  IMPRESSION: No active disease.  No significant change.   Original Report Authenticated By: Natasha Mead, M.D.     ASSESSMENT: 79  y.o. 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 They understand stage 4 stomach cancer is treatable but not curable. Treatment consists of chemotherapy, with many options (oral or IV, single agent or multiple). In patients with a good functional status chemotherapy in this setting extends life an average of a few months. Patients with a poor functional status (as Mr Jost's  currently is) are generally excluded from those studies--that data does not apply to him, and chemotherapy would certainly compromise his quality of life and might shorten his life.  Unless the patient's condition turns around clinically, my suggestion is for Hospice/Palliative care/consider home hospice vs. [facility]. I have placed a palliative team referral so patient and family can continue to discuss this (I will be out of town the next few days after tomorrow). If they want to onsider the chemotherapy option further and patient becomes ambulatory we can make an appointment for him to see one of our GI Oncology specialists (Dr Truett Perna or Dr Clelia Croft). If the patient opts for Hospice care I will be glad to be his Hospice MD, especially if he goes to [facility] (where I visit every Friday)    Jewell Ryans C    06/05/2012

## 2012-06-06 ENCOUNTER — Inpatient Hospital Stay (HOSPITAL_COMMUNITY): Payer: Medicare Other

## 2012-06-06 DIAGNOSIS — G459 Transient cerebral ischemic attack, unspecified: Secondary | ICD-10-CM

## 2012-06-06 DIAGNOSIS — R55 Syncope and collapse: Secondary | ICD-10-CM

## 2012-06-06 LAB — BASIC METABOLIC PANEL
CO2: 26 mEq/L (ref 19–32)
Calcium: 8.9 mg/dL (ref 8.4–10.5)
Creatinine, Ser: 0.8 mg/dL (ref 0.50–1.35)
GFR calc non Af Amer: 83 mL/min — ABNORMAL LOW (ref >90)
Glucose, Bld: 114 mg/dL — ABNORMAL HIGH (ref 70–99)
Sodium: 141 mEq/L (ref 135–145)

## 2012-06-06 LAB — HEPARIN LEVEL (UNFRACTIONATED)
Heparin Unfractionated: 0.15 IU/mL — ABNORMAL LOW (ref 0.30–0.70)
Heparin Unfractionated: 0.24 IU/mL — ABNORMAL LOW (ref 0.30–0.70)

## 2012-06-06 LAB — CBC
MCH: 33.3 pg (ref 26.0–34.0)
MCHC: 35.7 g/dL (ref 30.0–36.0)
MCV: 93.3 fL (ref 78.0–100.0)
Platelets: 242 10*3/uL (ref 150–400)
RBC: 3.3 MIL/uL — ABNORMAL LOW (ref 4.22–5.81)

## 2012-06-06 LAB — MAGNESIUM: Magnesium: 1.7 mg/dL (ref 1.5–2.5)

## 2012-06-06 LAB — PHOSPHORUS: Phosphorus: 2.6 mg/dL (ref 2.3–4.6)

## 2012-06-06 MED ORDER — HEPARIN (PORCINE) IN NACL 100-0.45 UNIT/ML-% IJ SOLN
1900.0000 [IU]/h | INTRAMUSCULAR | Status: DC
  Administered 2012-06-06 – 2012-06-07 (×2): 1500 [IU]/h via INTRAVENOUS
  Administered 2012-06-07: 1700 [IU]/h via INTRAVENOUS
  Administered 2012-06-08: 1900 [IU]/h via INTRAVENOUS
  Filled 2012-06-06 (×6): qty 250

## 2012-06-06 MED ORDER — FUROSEMIDE 10 MG/ML IJ SOLN
40.0000 mg | Freq: Two times a day (BID) | INTRAMUSCULAR | Status: AC
  Administered 2012-06-06 (×2): 40 mg via INTRAVENOUS
  Filled 2012-06-06 (×3): qty 4

## 2012-06-06 MED ORDER — POTASSIUM PHOSPHATE DIBASIC 3 MMOLE/ML IV SOLN
30.0000 mmol | Freq: Once | INTRAVENOUS | Status: AC
  Administered 2012-06-06: 30 mmol via INTRAVENOUS
  Filled 2012-06-06: qty 10

## 2012-06-06 MED ORDER — POTASSIUM CHLORIDE CRYS ER 20 MEQ PO TBCR: 40.0000 meq | EXTENDED_RELEASE_TABLET | Freq: Two times a day (BID) | ORAL | Status: DC

## 2012-06-06 MED ORDER — MAGNESIUM SULFATE 40 MG/ML IJ SOLN
2.0000 g | Freq: Once | INTRAMUSCULAR | Status: AC
  Administered 2012-06-06: 2 g via INTRAVENOUS
  Filled 2012-06-06: qty 50

## 2012-06-06 MED ORDER — POTASSIUM CHLORIDE 10 MEQ/100ML IV SOLN
10.0000 meq | INTRAVENOUS | Status: AC
  Administered 2012-06-06 (×3): 10 meq via INTRAVENOUS
  Filled 2012-06-06 (×2): qty 200

## 2012-06-06 NOTE — Progress Notes (Signed)
Patient ID: [name] [name], male   DOB: [birth year], 79 y.o.   MRN: [id number] 3 Days Post-Op  Subjective: Wife states patient has not slept all night.  Somewhat restless.  Currently sleeping.  Did not waken him.  flexi-seal removed.  Unable to do swallow study yesterday.  Will do today.  Objective: Vital signs in last 24 hours: Temp:  [97.6 F (36.4 C)-98.9 F (37.2 C)] 98.3 F (36.8 C) (03/20 0400) Pulse Rate:  [70-152] 79 (03/20 0700) Resp:  [17-32] 27 (03/20 0700) BP: (94-162)/(56-87) 146/83 mmHg (03/20 0700) SpO2:  [95 %-100 %] 99 % (03/20 0700) Weight:  [188 lb 0.8 oz (85.3 kg)] 188 lb 0.8 oz (85.3 kg) (03/20 0500) Last BM Date: 06/05/12  Intake/Output from previous day: 03/19 0701 - 03/20 0700 In: 521.2 [I.V.:421.2; IV Piggyback:100] Out: 1825 [Urine:1700; Stool:125] Intake/Output this shift:    PE: Abd: soft, slight increase in bloating, did not further examine as patient finally asleep  Lab Results:   Recent Labs  06/05/12 0355 06/06/12 0517  WBC 13.1* 11.3*  HGB 11.3* 11.0*  HCT 31.6* 30.8*  PLT 243 242   BMET  Recent Labs  06/05/12 0355 06/06/12 0517  NA 141 141  K 3.2* 3.0*  CL 107 104  CO2 21 26  GLUCOSE 121* 114*  BUN 15 16  CREATININE 0.84 0.80  CALCIUM 8.8 8.9   PT/INR No results found for this basename: LABPROT, INR,  in the last 72 hours CMP     Component Value Date/Time   NA 141 06/06/2012 0517   K 3.0* 06/06/2012 0517   CL 104 06/06/2012 0517   CO2 26 06/06/2012 0517   GLUCOSE 114* 06/06/2012 0517   BUN 16 06/06/2012 0517   CREATININE 0.80 06/06/2012 0517   CALCIUM 8.9 06/06/2012 0517   PROT 6.2 03/13/2011 0500   ALBUMIN 3.2* 03/13/2011 0500   AST 17 03/13/2011 0500   ALT 11 03/13/2011 0500   ALKPHOS 89 03/13/2011 0500   BILITOT 1.1 03/13/2011 0500   GFRNONAA 83* 06/06/2012 0517   GFRAA >90 06/06/2012 0517   Lipase  No results found for this basename: lipase   CEA: 2.5    Studies/Results: Ct Head Wo Contrast  06/04/2012   *RADIOLOGY REPORT*  Clinical Data: New onset confusion and visual hallucinations. Prior history of stroke.  CT HEAD WITHOUT CONTRAST  Technique:  Contiguous axial images were obtained from the base of the skull through the vertex without contrast.  Comparison: Unenhanced cranial CT 04/17/2012, 03/12/2011.  MRI brain 03/12/2011.  Findings: Moderate cortical and deep atrophy, unchanged.  Moderate to severe changes of small vessel disease of the white matter,, including the brainstem, unchanged.  Old lacunar strokes in the deep white matter of the right frontal lobe and in the right inferior cerebellar hemisphere, unchanged.  No mass lesion.  No midline shift.  No acute hemorrhage or hematoma.  No extra-axial fluid collections.  No evidence of acute infarction.  No significant interval change.  Near complete opacification of the left maxillary sinus with thickening of the sinus walls.  Opacification of a middle right ethmoid air cell.  Remaining visualized paranasal sinuses, bilateral mastoid air cells, and both middle ear cavities well- aerated.  Bilateral carotid siphon atherosclerosis.  Calcified pannus posterior to the dens again noted.  IMPRESSION:  1.  No acute intracranial abnormality. 2.  Stable moderate generalized atrophy, moderate to severe chronic microvascular ischemic changes of the white matter, and old lacunar strokes in the right frontal lobe  and right inferior cerebellar hemisphere. 3.  Chronic left maxillary sinusitis and minimal chronic right ethmoid sinusitis.   Original Report Authenticated By: Hulan Saas, M.D.    Dg Chest Port 1 View  06/05/2012  *RADIOLOGY REPORT*  Clinical Data: Shortness of breath.  PORTABLE CHEST - 1 VIEW  Comparison: 05/31/2012  Findings: Shallow inspiration.  Bilateral pleural effusions with basilar atelectasis or infiltration bilaterally.  These changes are progressing since previous study.  Mild cardiac enlargement with borderline normal pulmonary vascularity.  No  pneumothorax. Tortuous aorta.  IMPRESSION: Shallow inspiration with increasing bilateral pleural effusions and bilateral basilar atelectasis or infiltration.   Original Report Authenticated By: Burman Nieves, M.D.     Anti-infectives: Anti-infectives   Start     Dose/Rate Route Frequency Ordered Stop   05/30/12 2200  ciprofloxacin (CIPRO) IVPB 400 mg  Status:  Discontinued     400 mg 200 mL/hr over 60 Minutes Intravenous Every 12 hours 05/30/12 2123 06/04/12 0746   05/30/12 2200  metroNIDAZOLE (FLAGYL) IVPB 500 mg  Status:  Discontinued     500 mg 100 mL/hr over 60 Minutes Intravenous Every 8 hours 05/30/12 2123 06/04/12 0746       Assessment/Plan  1. Adenocarcinoma, primary unknown, but possible stomach with mets in colon 2. H/o CVA, possible new CVA 3. A fib  Plan: 1. Pathology reveals that this new finding is likely a gastric primary with mets to the colon.  His CEA is normal at 2.5 which also supports this finding as well.  We have d/w Dr. Donell Beers and her initial recommendations are to get a PET scan to see if we can determine just how involved this or possibly at some point an EUS if needed.  Dr. Darnelle Catalan saw the patient yesterday and recommended home with hospice; however, I feel like we don't have quite enough information as to what's going on to make that recommendation just yet.  He did suggest Dr. Truett Perna or Dr. Clelia Croft be informed of the patient if the family wanted to pursue another option besides hospice.  I have called Dr. Truett Perna and am waiting for a return call to discuss the case with him.  Also, with this new found information, I may go by radiology and have them relook at his CT scan to see if that changes some of what's been read. 2. Await swallow study to determine avenue of feeding. 3. Other medical issues per primary service. 4. All of this was d/w the patient's wife.  The patient was asleep for most of the conversation.  5. If the patient redevelops a malignant  obstruction, he may need a palliative operation at some point.  Will follow clinically for this.  LOS: 7 days    OSBORNE,KELLY E 06/06/2012, 7:58 AM Pager: [phone]  This is a difficult situation but I think that he needs to recover from his acute medical issues prior to addressing the cancer.  Ideally he can be discharged on a diet and have oncology workup and PET scan.  It is difficult to make surgical recommendations on the little information that we have currently.  I have discussed this with the patient and his wife.

## 2012-06-06 NOTE — Evaluation (Signed)
Clinical/Bedside Swallow Evaluation Patient Details  Name: [name] [name] MRN: [id number] Date of Birth: 1930-08-02  Today's Date: 06/06/2012 Time: 4098-1191 SLP Time Calculation (min): 28 min  Past Medical History:  Past Medical History  Diagnosis Date  . Ramsay Hunt auricular syndrome   . HTN (hypertension)   . Osteoarthritis   . Dysrhythmia 04/17/12  . Anxiety   . Stroke   . H/O Bell's palsy at age 79  . Atrial flutter   . Atrial fibrillation    Past Surgical History:  Past Surgical History  Procedure Laterality Date  . Hernia repair    . Vasectomy    . Prostate biopsies      per Vonita Moss  . Cataract extraction      SE eye  . Colonoscopy N/A 06/03/2012    Procedure: COLONOSCOPY;  Surgeon: Florencia Reasons, MD;  Location: [facility] ENDOSCOPY;  Service: Endoscopy;  Laterality: N/A;  . Esophagogastroduodenoscopy N/A 06/03/2012    Procedure: ESOPHAGOGASTRODUODENOSCOPY (EGD);  Surgeon: Florencia Reasons, MD;  Location: [facility] ENDOSCOPY;  Service: Endoscopy;  Laterality: N/A;   HPI:  79 yo WM with a pmh of stroke/caf on pradaxa who presented to [facility] ED 3-13 with 2 days of abd pain and distension. Admitted to hospital and underwent EGD 3-17 and multiple bxs were obtained the gastric and the colonic biopsies show malignancy, probable adenocarcinoma. He developed increased wob and clinical findings consistent with pulmonary edema(crackles) and was transferred to ICU from SDU. He did not tolerate NIMVS (claustrophobia) but responded well to lasix and NTG. Note he has speech changes (dysarthia and thick tongue) CT head 3-19 with no new changes. Upper GI on 01/04/12 shows Moderate tertiary contractions in the mid and distal esophagus, swallowing mechanism normal.    Assessment / Plan / Recommendation Clinical Impression  Pt difficult to assess for change due to baseline oral motor dysfunction. Presents with appearance of delayed swallow initiation, questionable evidence of residual (wet vocal  quality), and questionable weak base of tongue/velar function evidenced by decreased plosive quality on velar consonants. Despite absence of overt signs of aspriation, these findings are concerning enough to limit pts PO intake until objective test (MBS) can be completed tomorrow at 930 am. Pt and family agree. Pt may have ice chips prn, following oral care.     Aspiration Risk  Moderate    Diet Recommendation Ice chips PRN after oral care;NPO        Other  Recommendations Recommended Consults: MBS   Follow Up Recommendations       Frequency and Duration        Pertinent Vitals/Pain NA    SLP Swallow Goals     Swallow Study Prior Functional Status       General HPI: 79 yo WM with a pmh of stroke/caf on pradaxa who presented to [facility] ED 3-13 with 2 days of abd pain and distension. Admitted to hospital and underwent EGD 3-17 and multiple bxs were obtained the gastric and the colonic biopsies show malignancy, probable adenocarcinoma. He developed increased wob and clinical findings consistent with pulmonary edema(crackles) and was transferred to ICU from SDU. He did not tolerate NIMVS (claustrophobia) but responded well to lasix and NTG. Note he has speech changes (dysarthia and thick tongue) CT head 3-19 with no new changes. Upper GI on 01/04/12 shows Moderate tertiary contractions in the mid and distal esophagus, swallowing mechanism normal.  Type of Study: Bedside swallow evaluation Diet Prior to this Study: NPO Temperature Spikes Noted: No History of  Recent Intubation: No Behavior/Cognition: Cooperative;Pleasant mood;Lethargic Oral Cavity - Dentition: Adequate natural dentition Self-Feeding Abilities: Able to feed self Patient Positioning: Upright in chair Baseline Vocal Quality: Clear Volitional Cough: Strong Volitional Swallow: Able to elicit    Oral/Motor/Sensory Function Overall Oral Motor/Sensory Function: Impaired at baseline (herpetic infection of the ear 17 years ago.  ) Labial ROM: Reduced left Labial Symmetry: Abnormal symmetry left Labial Strength: Reduced Labial Sensation: Reduced Lingual ROM:  (reduced body of tongue elevation?) Lingual Symmetry: Abnormal symmetry right (questionable) Lingual Strength: Reduced Facial ROM: Reduced left Facial Symmetry: Left droop;Left drooping eyelid Facial Strength: Reduced Velum: Impaired right Mandible: Within Functional Limits   Ice Chips Ice chips: Impaired Presentation: Spoon Oral Phase Impairments: Reduced labial seal;Reduced lingual movement/coordination;Impaired anterior to posterior transit;Poor awareness of bolus Oral Phase Functional Implications: Oral residue;Oral holding;Prolonged oral transit Pharyngeal Phase Impairments: Wet Vocal Quality   Thin Liquid Thin Liquid: Impaired Presentation: Cup;Straw;Self Fed Oral Phase Impairments: Reduced labial seal Oral Phase Functional Implications: Right anterior spillage;Left anterior spillage;Left lateral sulci pocketing;Oral residue Pharyngeal  Phase Impairments: Multiple swallows;Decreased hyoid-laryngeal movement;Suspected delayed Swallow    Nectar Thick Nectar Thick Liquid: Not tested   Honey Thick Honey Thick Liquid: Not tested   Puree Puree: Impaired Presentation: Spoon Oral Phase Impairments: Reduced labial seal Pharyngeal Phase Impairments: Suspected delayed Swallow   Solid   GO    Solid: Not tested      Harlon Ditty, MA CCC-SLP [phone]  Delmont Prosch, Riley Nearing 06/06/2012,2:52 PM

## 2012-06-06 NOTE — Progress Notes (Signed)
PULMONARY  / CRITICAL CARE MEDICINE  Name: [name] [name] MRN: [id number] DOB: 02/12/1931    ADMISSION DATE:  05/30/2012 CONSULTATION DATE:  3-17  REFERRING MD :  triad  CHIEF COMPLAINT:  resp distress   BRIEF PATIENT DESCRIPTION:  79 yo WM with a pmh of stroke/caf on pradaxa who presented to [facility] ED 3-13 with 2 days of abd pain and distension. Admitted to hospital and underwent EGD 3-17 and multiple bxs were obtained. He developed increased wob and clinical findings consistent with pulmonary edema(crackles) and was transferred to ICU from SDU. He did not tolerate NIMVS (claustrophobia) but responded well to lasix and NTG. Note he has speech changes (dysarthia and thick tongue) CT head 3-19 with no new changes. PCCM asked to assume care.  SIGNIFICANT EVENTS / STUDIES:  3-19 flash pulmoary edema requiring ICU transfer. 3/20 stable and back to SDU.  LINES / TUBES: PIV  CULTURES: 3-14 uc>>neg 3-13 cdif>>neg  ANTIBIOTICS: None  SUBJECTIVE: Refused BiPAP overnight but tolerating [place] well this AM.  VITAL SIGNS: Temp:  [97.6 F (36.4 C)-98.9 F (37.2 C)] 98.2 F (36.8 C) (03/20 0800) Pulse Rate:  [70-95] 74 (03/20 0800) Resp:  [16-32] 24 (03/20 0800) BP: (100-162)/(56-91) 146/91 mmHg (03/20 0800) SpO2:  [95 %-100 %] 100 % (03/20 0800) Weight:  [85.3 kg (188 lb 0.8 oz)] 85.3 kg (188 lb 0.8 oz) (03/20 0500) HEMODYNAMICS:   VENTILATOR SETTINGS:   INTAKE / OUTPUT: Intake/Output     03/19 0701 - 03/20 0700 03/20 0701 - 03/21 0700   P.O.     I.V. (mL/kg) 421.2 (4.9) 71 (0.8)   IV Piggyback 100    Total Intake(mL/kg) 521.2 (6.1) 71 (0.8)   Urine (mL/kg/hr) 1700 (0.8) 65 (0.2)   Stool 125 (0.1)    Total Output 1825 65   Net -1303.9 +6         PHYSICAL EXAMINATION: General:  EWF NAD at rest Neuro:  MAE x 4. Speech dyarthic but reportedly improved from 3/19.  HEENT: No LAN, neck soreness noted Cardiovascular:  HSR RRR periods of Afib Lungs: bibasilar crackles Abdomen:   Non distended, +bs, flexiseal with liquid stool. Musculoskeletal:  intact Skin:  warm  LABS:  Recent Labs Lab 05/30/12 1527 05/30/12 1545  05/31/12 0209  06/04/12 0400 06/05/12 0355 06/05/12 0552 06/05/12 0650 06/05/12 1140 06/05/12 1709 06/06/12 0517  HGB  --   --   --   --   < > 12.0* 11.3*  --   --   --   --  11.0*  WBC  --   --   --   --   < > 13.0* 13.1*  --   --   --   --  11.3*  PLT  --   --   --   --   < > 235 243  --   --   --   --  242  NA  --   --   --   --   < > 139 141  --   --   --   --  141  K  --   --   --   --   < > 3.7 3.2*  --   --   --   --  3.0*  CL  --   --   --   --   < > 105 107  --   --   --   --  104  CO2  --   --   --   --   < >  20 21  --   --   --   --  26  GLUCOSE  --   --   --   --   < > 107* 121*  --   --   --   --  114*  BUN  --   --   --   --   < > 15 15  --   --   --   --  16  CREATININE  --   --   --   --   < > 0.87 0.84  --   --   --   --  0.80  CALCIUM  --   --   --   --   < > 8.8 8.8  --   --   --   --  8.9  MG  --   --   --   --   --  1.7  --   --   --   --   --  1.7  PHOS  --   --   --   --   --   --   --   --   --   --   --  2.6  LATICACIDVEN  --  1.07  --   --   --   --   --   --   --   --   --   --   TROPONINI 0.38*  --   < > 2.90*  --   --   --   --  0.35* 0.33* <0.30  --   PROBNP  --   --   --   --   --   --   --   --   --  4698.0*  --   --   PHART  --   --   --   --   --   --   --  7.270*  --   --   --   --   PCO2ART  --   --   --   --   --   --   --  47.7*  --   --   --   --   PO2ART  --   --   --   --   --   --   --  57.0*  --   --   --   --   < > = values in this interval not displayed.  Recent Labs Lab 06/05/12 0555  GLUCAP 150*   Imaging: Ct Head Wo Contrast  06/04/2012  *RADIOLOGY REPORT*  Clinical Data: New onset confusion and visual hallucinations. Prior history of stroke.  CT HEAD WITHOUT CONTRAST  Technique:  Contiguous axial images were obtained from the base of the skull through the vertex without contrast.   Comparison: Unenhanced cranial CT 04/17/2012, 03/12/2011.  MRI brain 03/12/2011.  Findings: Moderate cortical and deep atrophy, unchanged.  Moderate to severe changes of small vessel disease of the white matter,, including the brainstem, unchanged.  Old lacunar strokes in the deep white matter of the right frontal lobe and in the right inferior cerebellar hemisphere, unchanged.  No mass lesion.  No midline shift.  No acute hemorrhage or hematoma.  No extra-axial fluid collections.  No evidence of acute infarction.  No significant interval change.  Near complete opacification of the left maxillary sinus with thickening of the sinus walls.  Opacification of a middle right ethmoid  air cell.  Remaining visualized paranasal sinuses, bilateral mastoid air cells, and both middle ear cavities well- aerated.  Bilateral carotid siphon atherosclerosis.  Calcified pannus posterior to the dens again noted.  IMPRESSION:  1.  No acute intracranial abnormality. 2.  Stable moderate generalized atrophy, moderate to severe chronic microvascular ischemic changes of the white matter, and old lacunar strokes in the right frontal lobe and right inferior cerebellar hemisphere. 3.  Chronic left maxillary sinusitis and minimal chronic right ethmoid sinusitis.   Original Report Authenticated By: Hulan Saas, M.D.    Dg Chest Port 1 View  06/06/2012  *RADIOLOGY REPORT*  Clinical Data: Shortness of breath.  Congestion.  PORTABLE CHEST - 1 VIEW  Comparison: 06/05/2012.  Findings: Tortuous aorta particularly involving the ascending aspect.  Cardiomegaly.  Pulmonary vascular congestion most notable centrally  Poor inspiration.  Crowding markings lung bases versus atelectasis. Subtle infiltrate would be difficult to exclude in this setting. Appearance without significant change.  Right lung apex not entirely included present exam.  No gross pneumothorax.  IMPRESSION: Pulmonary vascular congestion most notable centrally.  Poor inspiration  with basilar atelectasis.  Tortuous aorta.  Cardiomegaly.   Original Report Authenticated By: Lacy Duverney, M.D.    Dg Chest Port 1 View  06/05/2012  *RADIOLOGY REPORT*  Clinical Data: Shortness of breath.  PORTABLE CHEST - 1 VIEW  Comparison: 05/31/2012  Findings: Shallow inspiration.  Bilateral pleural effusions with basilar atelectasis or infiltration bilaterally.  These changes are progressing since previous study.  Mild cardiac enlargement with borderline normal pulmonary vascularity.  No pneumothorax. Tortuous aorta.  IMPRESSION: Shallow inspiration with increasing bilateral pleural effusions and bilateral basilar atelectasis or infiltration.   Original Report Authenticated By: Burman Nieves, M.D.    Principal Problem:   Acute respiratory distress Active Problems:   Gastroparesis   Atrial fibrillation, rapid   GI bleed   Syncope   Acute encephalopathy   Dysarthria, acute   Paroxysmal a-fib/flutter  ASSESSMENT / PLAN:  PULMONARY A:Resp distress secondary to pulmonary edema, ? New stroke with dysarthria. Unable to tolerate NIMVS and no longer necessary. P:   - Diuresis as tolerated - BP control - Swallow evaluation per speech pathology (ordered and pending). - D/C NIMV. - Palliative care to address intubation/plan of care.  CARDIOVASCULAR A: HTN/Flash pulmonary edema/CAF P:  - D/C NTG. - Diuresis as needed. - May need cvl to guide fluids but will hold for now. - KVO fluids. - D/Ced Cardizem drip due to hypotension, HR is well controlled at this point.  RENAL Lab Results  Component Value Date   CREATININE 0.80 06/06/2012   CREATININE 0.84 06/05/2012   CREATININE 0.87 06/04/2012   A:  No acute issue P:   - Lasix x2 doses as ordered. - K, Mg and Phos replacement. - BMET in AM.  GASTROINTESTINAL A:  GIB, ? Of gut ischemia P:   - Per GI/Vascular surgery. - Swallow eval ordered.  HEMATOLOGIC  Recent Labs  06/05/12 0355 06/06/12 0517  HGB 11.3* 11.0*   A:  Concern for blood loss from gib P:  - Monitor hgb  INFECTIOUS A:  No acute issue P: Monitor fever and WBC.  ENDOCRINE  Recent Labs Lab 06/04/12 0400 06/05/12 0355 06/06/12 0517  K 3.7 3.2* 3.0*   A: No active issues. P:   - Monitor  NEUROLOGIC A:  AMS/Hx of stroke/New speech changes 3-19/?new stroke P:   - CT head no new changes. - Neuro consult , may need MRI(note he is  claustrophobic), will defer to neuro. - Hold anticoagulation.  TODAY'S SUMMARY:  Transfer to the SDU and TRH to pick up in AM.  PCCM will sign off, please call back if needed.  Alyson Reedy, M.D. [facility] Pulmonary/Critical Care Medicine. Pager: [phone]. After hours pager: [phone].

## 2012-06-06 NOTE — Progress Notes (Signed)
Thank you for consulting the Palliative Medicine Team at [facility] to meet your patient's and family's needs.   The reason that you asked [place] to see your patient is for further discussion of potions and clarify goals of care  We have scheduled your patient for a meeting: Friday 06/07/12 @ 11:00 am per family request so patient's children can also participate  The Surrogate decision make is: wife Clayborn Bigness information: c: [phone]  Other family members that need to be present: patient's son Shon Baton and daughter  Your patient is able/unable to participate: per nursing staff patient has been intermittently confused   Valente David, RN 06/06/2012, 9:00 AM Palliative Medicine Team RN Liaison [phone]

## 2012-06-06 NOTE — Progress Notes (Addendum)
Patient Name: [name] [name] Date of Encounter: 06/06/2012  Principal Problem:   Acute respiratory distress Active Problems:   Gastroparesis   Atrial fibrillation, rapid   GI bleed   Syncope   Acute encephalopathy   Dysarthria, acute   Paroxysmal a-fib/flutter    SUBJECTIVE: [name] [name] is a 79 year old male with history of atrial fibrillation.  Previously on pradaxa, which was held upon admission for GI bleed. Has been in and out of afib since admission, rate control with beta blocker and Cardizem when needed. Started on heparin 3/19 for new slurred speech and concern for new CVA. Per Dr. 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   He was reports poor sleep last night with fatigue and generalized weakness this morning. His wife reports visual hallucinations yesterday and overnight. Patient denies chest pain, palpitations, or increased SOB.   OBJECTIVE Filed Vitals:   06/06/12 0400 06/06/12 0500 06/06/12 0600 06/06/12 0700  BP: 146/77 155/83 144/81 146/83  Pulse: 77 82 73 79  Temp: 98.3 F (36.8 C)     TempSrc: Axillary     Resp: 32  29 27  Height:      Weight:  188 lb 0.8 oz (85.3 kg)    SpO2: 99% 99% 99% 99%    Intake/Output Summary (Last 24 hours) at 06/06/12 0802 Last data filed at 06/06/12 0700  Gross per 24 hour  Intake  505.1 ml  Output   1425 ml  Net -919.9 ml   Filed Weights   05/30/12 0840 05/30/12 2015 06/06/12 0500  Weight: 185 lb (83.915 kg) 187 lb 2.7 oz (84.9 kg) 188 lb 0.8 oz (85.3 kg)    PHYSICAL EXAM General: Well developed, well nourished, ill-appearing male in no acute distress. Slurred speech.  Head: Normocephalic, atraumatic.  Neck: Supple without bruits, JVD. Lungs:  Resp regular and moderately labored  and audible at rest. Bilateral crackles and rales at the bases.  Heart: RRR, S1, S2, no S3, S4, or murmur; no rub. Abdomen: Soft, mild diffuse tenderness, non-distended, BS + x 4.  Extremities: No clubbing, cyanosis, edema. Pulses 2+ in all 4 extremities.  Neuro: Alert and oriented X 3. Moves all extremities spontaneously.  Psych: Sleeping at times but when aroused, responds appropriately.   LABS: CBC: Recent Labs  06/05/12 0355 06/06/12 0517  WBC 13.1* 11.3*  HGB 11.3* 11.0*  HCT 31.6* 30.8*  MCV 95.8 93.3  PLT 243 242   Basic Metabolic Panel: Recent Labs  06/04/12 0400 06/05/12 0355 06/06/12 0517  NA 139 141 141  K 3.7 3.2* 3.0*  CL 105 107 104  CO2 20 21 26   GLUCOSE 107* 121* 114*  BUN 15 15 16   CREATININE 0.87 0.84 0.80  CALCIUM 8.8 8.8 8.9  MG 1.7  --  1.7  PHOS  --   --  2.6   Liver Function Tests:No results found for this basename: AST, ALT, ALKPHOS, BILITOT, PROT, ALBUMIN,  in the last 72 hours Cardiac Enzymes: Recent Labs  06/05/12 0650 06/05/12 1140 06/05/12 1709  TROPONINI 0.35* 0.33* <0.30   No results found for this basename: TROPIPOC,  in the last 72 hours BNP: Pro B Natriuretic peptide (BNP)  Date/Time Value Range Status  06/05/2012 11:40 AM 4698.0* 0 - 450 pg/mL Final   Anemia Panel: Recent Labs  06/04/12 1348  VITAMINB12 887    TELE: NSR, rate controlled at 77bpm  ECG: 05-Jun-2012 06:36:51  Sinus rhythm with Fusion complexes Otherwise normal ECG Vent. rate 90 BPM PR interval 156 ms QRS duration 86 ms QT/QTc 360/440 ms P-R-T axes 23 -4 -10 Old tracing reviewed: no significant change  Radiology/Studies: Ct Head Wo Contrast 06/04/2012  *RADIOLOGY REPORT*  Clinical Data: New onset confusion and visual hallucinations. Prior history of stroke.  CT HEAD WITHOUT CONTRAST  Technique:  Contiguous axial images were obtained from the base of the skull through the vertex without contrast.  Comparison: Unenhanced cranial CT 04/17/2012,  03/12/2011.  MRI brain 03/12/2011.  Findings: Moderate cortical and deep atrophy, unchanged.  Moderate to severe changes of small vessel disease of the white matter,, including the brainstem, unchanged.  Old lacunar strokes in the deep white matter of the right frontal lobe and in the right inferior cerebellar hemisphere, unchanged.  No mass lesion.  No midline shift.  No acute hemorrhage or hematoma.  No extra-axial fluid collections.  No evidence of acute infarction.  No significant interval change.  Near complete opacification of the left maxillary sinus with thickening of the sinus walls.  Opacification of a middle right ethmoid air cell.  Remaining visualized paranasal sinuses, bilateral mastoid air cells, and both middle ear cavities well- aerated.  Bilateral carotid siphon atherosclerosis.  Calcified pannus posterior to the dens again noted.  IMPRESSION:  1.  No acute intracranial abnormality. 2.  Stable moderate generalized atrophy, moderate to severe chronic microvascular ischemic changes of the white matter, and old lacunar strokes in the right frontal lobe and right inferior cerebellar hemisphere. 3.  Chronic left maxillary sinusitis and minimal chronic right ethmoid sinusitis.   Original Report Authenticated By: Hulan Saas, M.D.    Dg Chest Port 1 View 06/05/2012  *RADIOLOGY REPORT*  Clinical Data: Shortness of breath.  PORTABLE CHEST - 1 VIEW  Comparison: 05/31/2012  Findings: Shallow inspiration.  Bilateral pleural effusions with basilar atelectasis or infiltration bilaterally.  These changes are progressing since previous study.  Mild cardiac enlargement with borderline normal pulmonary vascularity.  No pneumothorax. Tortuous aorta.  IMPRESSION: Shallow inspiration with increasing bilateral pleural effusions and bilateral basilar atelectasis or infiltration.   Original Report Authenticated By: Burman Nieves, M.D.     Current Medications:  . LORazepam  0.5 mg Intravenous Once  .  metoprolol  5 mg Intravenous Q6H  . pantoprazole (PROTONIX) IV  40 mg Intravenous Q12H  . sodium chloride  3 mL Intravenous Q12H   . diltiazem (CARDIZEM) infusion 10 mg/hr (06/06/12 0155)  . heparin 1,300 Units/hr (06/05/12 2348)    ASSESSMENT AND PLAN: Mr. Genest is a 79 year old male with history of atrial fibrillation.  Previously on pradaxa, which was held upon admission for GI bleed. Has been in and out of afib since admission, rate control with beta blocker and Cardizem when needed. Started on heparin 3/19 for new slurred speech and concern for new CVA. Per Dr. 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   1. Atrial fibrillation (?paroxysmal): Currently in NSR with rate control on Cardizem drip and metoprolol when needed. Currently HR stable. Continue current meds today. Possible new CVA 3/19, although patient refused MRI. Treated with heparin currently at 1300 Units/hr. IV Rx only for now.  2.  Volume overload: Likely acute diastolic CHF. Systolic function normal on echo 3/18. No significant valvular disease. Still elevated volume status today. Received Lasix total 80mg  yesterday AM, will give 40mg  IV BID today only.    3. Possible CVA/slurred speech: Since 3/19. CT head with no acute changes. He has had multiple neurological events over the last 18 months.He had a CVA on 03/12/11 and was admitted to [facility]. Echo with normal LV function. Carotid artery dopplers were normal. He described right sided arm and leg weakness. He completely recovered neurologically. He was started on Plavix in the hospital after his stroke in 2012. He was hospitalized in [place], [place] 1/26-1/28/14 for TIA vs CVA. During hospitalization (per records in paper chart) echo again showed nl  LV systolic fxn 55-65%, no WMAs, or cardiac source of emboli. ASA was added to his regimen. He was discharged home but was admitted to [facility] on 04/17/12 with speech deficits. In the ED EKG showed Atrial Flutter w/ variable conduction 95bpm, nonspecific ST/T changes. He spontaneously converted to NSR. His speech returned to normal after about one hour. His ASA and Plavix was stopped and he was started on Pradaxa for anticoagulation. Pradaxa held for GI bleeding. Neurology consult considered but not done yet.   4. Stage 4 metastatic linitis plastica - managed by oncology, reason for palliative care consult.  5. Hypokalemia - supplement and follow   Signed, Tandy Gaw  06/06/2012 9:06 AM  Theodore Demark , PA-C 8:02 AM 06/06/2012  I have personally seen and examined this patient with Theodore Demark, PA-C. I agree with the assessment and plan as outlined above. He is in sinus. Still volume overloaded.  Agree with IV lasix. Palliative care meeting with family in am. No new recommendations.   Amore Ackman 5:57 PM 06/06/2012

## 2012-06-07 ENCOUNTER — Inpatient Hospital Stay (HOSPITAL_COMMUNITY): Payer: Medicare Other

## 2012-06-07 DIAGNOSIS — E876 Hypokalemia: Secondary | ICD-10-CM | POA: Diagnosis not present

## 2012-06-07 DIAGNOSIS — R5381 Other malaise: Secondary | ICD-10-CM

## 2012-06-07 DIAGNOSIS — R1084 Generalized abdominal pain: Secondary | ICD-10-CM

## 2012-06-07 DIAGNOSIS — R531 Weakness: Secondary | ICD-10-CM

## 2012-06-07 LAB — CBC
Hemoglobin: 11.2 g/dL — ABNORMAL LOW (ref 13.0–17.0)
MCHC: 34.7 g/dL (ref 30.0–36.0)

## 2012-06-07 LAB — BASIC METABOLIC PANEL
BUN: 16 mg/dL (ref 6–23)
CO2: 29 mEq/L (ref 19–32)
Calcium: 9 mg/dL (ref 8.4–10.5)
Calcium: 9.2 mg/dL (ref 8.4–10.5)
Calcium: 8.4 mg/dL (ref 8.4–10.5)
Creatinine, Ser: 1.02 mg/dL (ref 0.50–1.35)
GFR calc Af Amer: 79 mL/min — ABNORMAL LOW (ref >90)
GFR calc Af Amer: 78 mL/min — ABNORMAL LOW (ref >90)
GFR calc non Af Amer: 79 mL/min — ABNORMAL LOW (ref >90)
GFR calc non Af Amer: 68 mL/min — ABNORMAL LOW (ref >90)
GFR calc non Af Amer: 67 mL/min — ABNORMAL LOW (ref >90)
Glucose, Bld: 122 mg/dL — ABNORMAL HIGH (ref 70–99)
Glucose, Bld: 145 mg/dL — ABNORMAL HIGH (ref 70–99)
Potassium: 3.1 mEq/L — ABNORMAL LOW (ref 3.5–5.1)
Potassium: 3.4 mEq/L — ABNORMAL LOW (ref 3.5–5.1)
Sodium: 140 mEq/L (ref 135–145)
Sodium: 137 mEq/L (ref 135–145)

## 2012-06-07 LAB — HEPARIN LEVEL (UNFRACTIONATED): Heparin Unfractionated: 0.18 IU/mL — ABNORMAL LOW (ref 0.30–0.70)

## 2012-06-07 LAB — MAGNESIUM: Magnesium: 1.9 mg/dL (ref 1.5–2.5)

## 2012-06-07 LAB — PHOSPHORUS: Phosphorus: 2.9 mg/dL (ref 2.3–4.6)

## 2012-06-07 MED ORDER — MORPHINE SULFATE (CONCENTRATE) 10 MG /0.5 ML PO SOLN
5.0000 mg | ORAL | Status: DC | PRN
  Administered 2012-06-07: 5 mg via ORAL
  Filled 2012-06-07: qty 0.5

## 2012-06-07 MED ORDER — POTASSIUM CHLORIDE CRYS ER 20 MEQ PO TBCR
40.0000 meq | EXTENDED_RELEASE_TABLET | Freq: Once | ORAL | Status: AC
  Administered 2012-06-07: 40 meq via ORAL
  Filled 2012-06-07: qty 2

## 2012-06-07 MED ORDER — SODIUM CHLORIDE 0.9 % IV SOLN
INTRAVENOUS | Status: DC
  Administered 2012-06-07 – 2012-06-08 (×3): via INTRAVENOUS
  Administered 2012-06-09: 250 mL via INTRAVENOUS
  Administered 2012-06-10: 10 mL/h via INTRAVENOUS

## 2012-06-07 MED ORDER — PANTOPRAZOLE SODIUM 40 MG PO TBEC
40.0000 mg | DELAYED_RELEASE_TABLET | Freq: Two times a day (BID) | ORAL | Status: DC
  Administered 2012-06-07: 40 mg via ORAL
  Filled 2012-06-07 (×2): qty 1

## 2012-06-07 MED ORDER — MORPHINE SULFATE (CONCENTRATE) 10 MG /0.5 ML PO SOLN
5.0000 mg | ORAL | Status: DC | PRN
  Administered 2012-06-08: 5 mg via ORAL
  Filled 2012-06-07: qty 0.5

## 2012-06-07 MED ORDER — DILTIAZEM HCL 100 MG IV SOLR
5.0000 mg/h | INTRAVENOUS | Status: DC
  Filled 2012-06-07: qty 100

## 2012-06-07 MED ORDER — POTASSIUM CHLORIDE 10 MEQ/100ML IV SOLN
INTRAVENOUS | Status: AC
  Filled 2012-06-07: qty 100

## 2012-06-07 MED ORDER — POTASSIUM CHLORIDE 10 MEQ/100ML IV SOLN: 10.0000 meq | INTRAVENOUS | Status: DC

## 2012-06-07 MED ORDER — POTASSIUM CHLORIDE 10 MEQ/100ML IV SOLN
10.0000 meq | INTRAVENOUS | Status: AC
  Administered 2012-06-07 (×6): 10 meq via INTRAVENOUS
  Filled 2012-06-07 (×5): qty 100

## 2012-06-07 MED ORDER — SODIUM CHLORIDE 0.9 % IV BOLUS (SEPSIS)
500.0000 mL | Freq: Once | INTRAVENOUS | Status: AC
  Administered 2012-06-07: 500 mL via INTRAVENOUS

## 2012-06-07 MED ORDER — BOOST / RESOURCE BREEZE PO LIQD
1.0000 | Freq: Three times a day (TID) | ORAL | Status: DC
  Administered 2012-06-07 – 2012-06-10 (×8): 1 via ORAL
  Filled 2012-06-07: qty 1

## 2012-06-07 MED ORDER — FLUMAZENIL 0.5 MG/5ML IV SOLN: 0.2000 mg | Freq: Once | INTRAVENOUS | Status: DC

## 2012-06-07 MED ORDER — LORAZEPAM 2 MG/ML IJ SOLN: 1.0000 mg | Freq: Once | INTRAMUSCULAR | Status: DC

## 2012-06-07 MED ORDER — POTASSIUM CHLORIDE 10 MEQ/100ML IV SOLN
10.0000 meq | INTRAVENOUS | Status: DC
  Administered 2012-06-07 (×2): 10 meq via INTRAVENOUS
  Filled 2012-06-07: qty 100

## 2012-06-07 MED ORDER — FUROSEMIDE 10 MG/ML IJ SOLN
40.0000 mg | Freq: Two times a day (BID) | INTRAMUSCULAR | Status: DC
  Administered 2012-06-07: 40 mg via INTRAVENOUS

## 2012-06-07 MED ORDER — SODIUM CHLORIDE 0.9 % IV BOLUS (SEPSIS)
250.0000 mL | Freq: Once | INTRAVENOUS | Status: AC
  Administered 2012-06-07: 250 mL via INTRAVENOUS

## 2012-06-07 NOTE — Progress Notes (Addendum)
While pt was in radiology for barium swallow, nurse was alerted via phone that pt was experiencing V tach.  Nurse went to radiology to assess pt, pt heart rhythm on portable monitor specified A fib. Per CMT V tach was not actual. Nurse will print and review strip once she arrives back on floor.  Pt was assessed; no signs of distress and denies any symptoms.  Nurse will remain with pt while transporting back to unit.  Cardiology paged to inform

## 2012-06-07 NOTE — Consult Note (Signed)
Patient ZO:XWRUEAV AISON MALVEAUX      DOB: 1930/04/26      WUJ:[id number]     Consult Note from the Palliative Medicine Team at [facility]    Consult Requested by: Dr Darnelle Catalan     PCP: Miguel Aschoff, MD Reason for Consultation: Clarification of GOC and options     Phone Number:[phone]  Assessment of patients Current state: Failing to thrive secondary to stage IV stomach cancer(new diagnosis), multiple co-morbidities including a-fib, CVA, HTN, gastroparesis, DM, significant weight loss, poor functional status  Consult is for review of medical treatment options, clarification of goals of care and end of life issues, disposition and options, and symptom recommendation.  This NP Lorinda Creed reviewed medical records, received report from team, assessed the patient and then meet at the patient's bedside along with his wife Kaydan Wong # [phone], son terreon ekholm and daughter Windell Moulding  to discuss diagnosis prognosis, GOC, EOL wishes disposition and options.   A detailed discussion was had today regarding advanced directives.  Concepts specific to code status, artifical feeding and hydration, continued IV antibiotics and rehospitalization was had.  The difference between a aggressive medical intervention path  and a palliative comfort care path for this patient at this time was had.  Values and goals of care important to patient and family were attempted to be elicited.  Concept of Hospice and Palliative Care were discussed  Natural trajectory and expectations at EOL were discussed.  Questions and concerns addressed. . Family encouraged to call with questions or concerns.  PMT will continue to support holistically.       Goals of Care: 1.  Code Status: Full Code   2. Scope of Treatment: 1.  Continue with all medial interventions to treat illness in hopes of improvement.  Patient and family are hopeful.  They hope to explore treatment options for his newly diagnosised cancer  with Dr Truett Perna early next week.  They are hopeful for more information (PET scan) to help in their decisions. Any decsion will be determined by quality of life the main focus of care for this patient  4. Disposition:  Dependant on outcomes over the next several days, open to rehabilitation if necessary   3. Symptom Management:   1. Weakness: Pt/OT consult, demonstrated AROM, encouraged self exercise plan with simple movement 2. Pain: Roxanol 5 mg every  3 hrs prn/Tylenol  4. Psychosocial:  Emotional support offered to patient and family at bedside.  They are grateful for the opportunity for this discussion.  5. Spiritual: refuses chaplain consult at this time   Brief HPI: Failing to thrive secondary to stage iv stomach cancer, multiple co-morbidities including a-fib, CVA, HTN, gastroparesis, DM, significant weight loss, poor functional status     ROS: stomach pain, "deep ache", usually rates at a 8/10, weakness, fatigue, weight loss, poor appittie    PMH:  Past Medical History  Diagnosis Date  . Ramsay Hunt auricular syndrome   . HTN (hypertension)   . Osteoarthritis   . Dysrhythmia 04/17/12  . Anxiety   . Stroke   . H/O Bell's palsy at age 22  . Atrial flutter   . Atrial fibrillation      PSH: Past Surgical History  Procedure Laterality Date  . Hernia repair    . Vasectomy    . Prostate biopsies      per Vonita Moss  . Cataract extraction      SE eye  . Colonoscopy N/A 06/03/2012    Procedure: COLONOSCOPY;  Surgeon: Florencia Reasons, MD;  Location: [facility] ENDOSCOPY;  Service: Endoscopy;  Laterality: N/A;  . Esophagogastroduodenoscopy N/A 06/03/2012    Procedure: ESOPHAGOGASTRODUODENOSCOPY (EGD);  Surgeon: Florencia Reasons, MD;  Location: [facility] ENDOSCOPY;  Service: Endoscopy;  Laterality: N/A;   I have reviewed the FH and SH and  If appropriate update it with new information. Allergies  Allergen Reactions  . Lactose Intolerance (Gi)   . Penicillins Rash    Rash  appeared on palms of hands and feet.   Scheduled Meds: . furosemide  40 mg Intravenous BID  . LORazepam  0.5 mg Intravenous Once  . metoprolol  5 mg Intravenous Q6H  . pantoprazole (PROTONIX) IV  40 mg Intravenous Q12H  . potassium chloride  10 mEq Intravenous Q1 Hr x 6  . sodium chloride  3 mL Intravenous Q12H   Continuous Infusions: . diltiazem (CARDIZEM) infusion 10 mg/hr (06/07/12 1037)  . heparin 1,700 Units/hr (06/07/12 0515)   PRN Meds:.acetaminophen, ondansetron (ZOFRAN) IV    BP 103/60  Pulse 99  Temp(Src) 98.2 F (36.8 C) (Oral)  Resp 26  Ht 6\' 1"  (1.854 m)  Wt 85.3 kg (188 lb 0.8 oz)  BMI 24.82 kg/m2  SpO2 100%   PPS:30% at best   Intake/Output Summary (Last 24 hours) at 06/07/12 1049 Last data filed at 06/07/12 1000  Gross per 24 hour  Intake    893 ml  Output   1751 ml  Net   -858 ml    Physical Exam:  General: weak ill appearing, NAD,  HEENT:  Dry buccal membranes, no exudate noted Chest:   CTA,  CVS: RRR Abdomen: slightly distended, tender to gentle palpation, +BS Ext: without edema Neuro: alert and oriented, strength 4/5 all extremities  Labs: CBC    Component Value Date/Time   WBC 8.9 06/07/2012 0405   RBC 3.42* 06/07/2012 0405   HGB 11.2* 06/07/2012 0405   HCT 32.3* 06/07/2012 0405   PLT 272 06/07/2012 0405   MCV 94.4 06/07/2012 0405   MCH 32.7 06/07/2012 0405   MCHC 34.7 06/07/2012 0405   RDW 13.2 06/07/2012 0405   LYMPHSABS 0.7 05/30/2012 0926   MONOABS 0.8 05/30/2012 0926   EOSABS 0.0 05/30/2012 0926   BASOSABS 0.0 05/30/2012 0926    BMET    Component Value Date/Time   NA 140 06/07/2012 0405   K 2.7* 06/07/2012 0405   CL 100 06/07/2012 0405   CO2 29 06/07/2012 0405   GLUCOSE 114* 06/07/2012 0405   BUN 16 06/07/2012 0405   CREATININE 0.89 06/07/2012 0405   CALCIUM 9.0 06/07/2012 0405   GFRNONAA 79* 06/07/2012 0405   GFRAA >90 06/07/2012 0405    CMP     Component Value Date/Time   NA 140 06/07/2012 0405   K 2.7* 06/07/2012 0405   CL  100 06/07/2012 0405   CO2 29 06/07/2012 0405   GLUCOSE 114* 06/07/2012 0405   BUN 16 06/07/2012 0405   CREATININE 0.89 06/07/2012 0405   CALCIUM 9.0 06/07/2012 0405   PROT 6.2 03/13/2011 0500   ALBUMIN 3.2* 03/13/2011 0500   AST 17 03/13/2011 0500   ALT 11 03/13/2011 0500   ALKPHOS 89 03/13/2011 0500   BILITOT 1.1 03/13/2011 0500   GFRNONAA 79* 06/07/2012 0405   GFRAA >90 06/07/2012 0405      Time In Time Out Total Time Spent with Patient Total Overall Time  1100 1230 80 min 90 min    Greater than 50%  of this time was  spent counseling and coordinating care related to the above assessment and plan.  Lorinda Creed NP  Palliative Medicine Team Team Phone # [phone] Pager [phone]  Discussed with Junious Silk NP

## 2012-06-07 NOTE — Progress Notes (Signed)
[facility] Progress Note [facility] TEAM 1 - Stepdown/ICU TEAM   [name] [name]:[id number] DOB: [birth year] DOA: 05/30/2012 PCP: Miguel Aschoff, MD  Brief narrative: 79 year old male with multiple medical problems including chronic atrial fibrillation on pradaxa, hx of stroke, hypertension, and  gastroparesis who presented to ED with complaints of abdominal distention with associated coffee-ground emesis. The symptoms started 2 days prior with abdominal pain diffusely with cramping sensation, level  7-8/10. Subsequently he noticed that his abdomen was getting distended. This was associated with intermittent diarrhea over a month. He had been evaluated by his gastroenterologist (Dr. Madilyn Fireman) who advised lactose-free diet but unfortunately he saw no improvement in the diarrhea. On the morning of admission he reported feeling dizzy and lightheaded and had an episode of diarrhea. He had a syncopal episode.  Patient denied hitting his head and was witnessed by his wife. Per his wife, he was out for only a few seconds. When patient presented to the ED, he was noted to be in rapid A. Fib, HR in 130's, hypotensive BP in 80's, he was placed on IV fluids. He had 2 episodes of coffee-ground emesis in the ED. FOBT was positive in ED. A CT scan of the abdomen was obtained due to the abdominal distention and coffee-ground emesis which showed moderate ascites with diffuse fluid filled and dilated small bowel likely mechanical small bowel obstruction with ischemic/infectious and inflammatory etiologies.   Assessment/Plan:  Acute respiratory failure with hypoxia due to RVR -resolved and stable on RA -focus on rate control, BP control   Colitis with reactive PSBO due to adeno CA (gastric/colon) / linitis plastica -Initially suspected to be possible ischemic colitis  -colonoscopy/EGD unremarkable on direct visualization but unfortunately path returned positive for AdenoCA -Appreciate onco assistance - felt to  be stage 4 and given multiple co-morbidities suggestion was for PALLIATIVE EVAL - option of palliative chemo as OP - Dr. Darnelle Catalan has volunteered to be his Hospice MD. -Appreciate Palliative assistance/agree with PT and Roxinol  Hematemesis/gastric CA/dysphagia (acute on chronic) -SLP rec thin liquids for now - hopefully can advance soon -Pradaxa on hold -as above  Acute delirium/TIA vs CVA/acute dysarthria -CT Head negative - consult neuro - ? MRI - ? Mets? -dysarthria now resolved -had apparent admit for CVA Jan 2014 in [facility] and was also admitted to [facility] Jan 2014 for expressive aphasia -with known AF anti-coagulation resumed with Heparin by PCCM (see their notes)  Diarrhea/dehydration -resolved  Rapid atrial fibrillation/flutter -Cardizem gtt - rates still quite variable -Cardiology following -also on IV Lopressor (scheduled) -keep K+ >4.0  Hypokalemia -2/2 high output diarrhea -Replace and recheck BMET in AM  Elevated cardiac enzymes -Likely due to rapid heart rate/ demand ischemia  HYPERTENSION, BENIGN SYSTEMIC -BP reasonably controlled  Gastroparesis -Had been referred to [facility] but given new findings of adenoCA and thickened esophagus/dysmotility we now have an explanation for these sx's  Syncope  -Likely due to above issues  Code Status: Full code Family Communication: discussed with pt and wife  Disposition Plan: SDU DVT prophylaxis: SCDs  Consultants: Gastroenterology Cardiology Neurology Oncology  Procedures: EGD (06/03/12)  1. Mucosal edema and slight nonerosive gastric erythema. Findings are nonspecific.  2.? Obstructive sleep apnea based on tolerance of procedure as described above  RECOMMENDATIONS:  1. Await pathology results  2. Proceed to colonoscopic evaluation   Colonoscopy (/06/03/12) 1. Significant mucosal edema and spasm in the mid transverse colon. The etiology is not clear, but I wonder about ischemia. Efforts to advance  the  scope more proximally were unsuccessful within the constraints of the exam in terms of patient sedation tolerance, see above discussion.  RECOMMENDATIONS:  1. Await pathology results. Supportive care in the meantime.  Antibiotics: Ciprofloxacin 3/13>> Flagyl 3/13>>  HPI/Subjective: Currently denies abd pain or nausea.  No chest pain or sob. No dysarthria on exam today.    Objective: Blood pressure 117/94, pulse 80, temperature 98.2 F (36.8 C), temperature source Oral, resp. rate 25, height 6\' 1"  (1.854 m), weight 85.3 kg (188 lb 0.8 oz), SpO2 100.00%.  Intake/Output Summary (Last 24 hours) at 06/07/12 1307 Last data filed at 06/07/12 1100  Gross per 24 hour  Intake    762 ml  Output   1751 ml  Net   -989 ml   Exam: General: Awake alert oriented x3, No acute respiratory distress Lungs: Clear to auscultation bilaterally without wheezes or crackles, 2 L oxygen Cardiovascular: Regular rate and normal sinus rhythm without murmur gallop or rub normal S1 and S2, no peripheral edema or JVD Abdomen: Nontender, mildly distended, soft, BS high pitched, NGT to LWS Musculoskeletal: No significant cyanosis, clubbing of bilateral lower extremities Neurological: Awake and oriented x3, moves all extremities x4, cranial nerves II through XII grossly intact  Data Reviewed: Basic Metabolic Panel:  Recent Labs Lab 06/03/12 0505 06/04/12 0400 06/05/12 0355 06/06/12 0517 06/07/12 0405  NA 138 139 141 141 140  K 3.2* 3.7 3.2* 3.0* 2.7*  CL 106 105 107 104 100  CO2 22 20 21 26 29   GLUCOSE 107* 107* 121* 114* 114*  BUN 13 15 15 16 16   CREATININE 0.90 0.87 0.84 0.80 0.89  CALCIUM 8.1* 8.8 8.8 8.9 9.0  MG  --  1.7  --  1.7 1.9  PHOS  --   --   --  2.6 2.9   CBC:  Recent Labs Lab 06/03/12 0505 06/04/12 0400 06/05/12 0355 06/06/12 0517 06/07/12 0405  WBC 10.2 13.0* 13.1* 11.3* 8.9  HGB 10.9* 12.0* 11.3* 11.0* 11.2*  HCT 30.7* 34.0* 31.6* 30.8* 32.3*  MCV 94.8 95.5 95.8 93.3 94.4   PLT 237 235 243 242 272   Cardiac Enzymes:  Recent Labs Lab 06/05/12 0650 06/05/12 1140 06/05/12 1709  TROPONINI 0.35* 0.33* <0.30    Recent Results (from the past 240 hour(s))  URINE CULTURE     Status: None   Collection Time    05/30/12  7:42 PM      Result Value Range Status   Specimen Description URINE, RANDOM   Final   Special Requests NONE   Final   Culture  Setup Time 05/31/2012 03:28   Final   Colony Count NO GROWTH   Final   Culture NO GROWTH   Final   Report Status 06/01/2012 FINAL   Final  CLOSTRIDIUM DIFFICILE BY PCR     Status: None   Collection Time    05/30/12  9:36 PM      Result Value Range Status   C difficile by pcr NEGATIVE  NEGATIVE Final  OVA AND PARASITE EXAMINATION     Status: None   Collection Time    05/30/12  9:36 PM      Result Value Range Status   Specimen Description STOOL   Final   Special Requests Normal   Final   Ova and parasites NO OVA OR PARASITES SEEN   Final   Report Status 06/05/2012 FINAL   Final  URINE CULTURE     Status: None   Collection  Time    05/31/12  2:23 AM      Result Value Range Status   Specimen Description URINE, CATHETERIZED   Final   Special Requests NONE   Final   Culture  Setup Time 05/31/2012 03:39   Final   Colony Count NO GROWTH   Final   Culture NO GROWTH   Final   Report Status 06/01/2012 FINAL   Final  MRSA PCR SCREENING     Status: None   Collection Time    05/31/12  2:46 AM      Result Value Range Status   MRSA by PCR NEGATIVE  NEGATIVE Final   Comment:            The GeneXpert MRSA Assay (FDA     approved for NASAL specimens     only), is one component of a     comprehensive MRSA colonization     surveillance program. It is not     intended to diagnose MRSA     infection nor to guide or     monitor treatment for     MRSA infections.     Studies:  Recent x-ray studies have been reviewed in detail by the Attending Physician  Scheduled Meds:  Scheduled Meds: . feeding supplement  1  Container Oral TID BM  . furosemide  40 mg Intravenous BID  . LORazepam  0.5 mg Intravenous Once  . metoprolol  5 mg Intravenous Q6H  . pantoprazole (PROTONIX) IV  40 mg Intravenous Q12H  . potassium chloride  10 mEq Intravenous Q1 Hr x 6  . sodium chloride  3 mL Intravenous Q12H   Continuous Infusions: . diltiazem (CARDIZEM) infusion 10 mg/hr (06/07/12 1037)  . heparin 1,700 Units/hr (06/07/12 0515)    Time spent on care of this patient: 35 minutes   ELLIS,ALLISON L.ANP Pager: [phone]  [facility] Office  [phone] Pager - Text Page per Loretha Stapler as per below:  On-Call/Text Page:      [email or web address]      password [email or web address]  If 7PM-7AM, please contact night-coverage [email or web address] Password [email or web address] 06/07/2012, 1:07 PM   LOS: 8 days   I have personally examined this patient and reviewed the entire database. I have reviewed the above note, made any necessary editorial changes, and agree with its content.  Lonia Blood, MD [facility]

## 2012-06-07 NOTE — Progress Notes (Signed)
Nurse reassessed pt post bolus; pt BP remains in 80's systolic, pt is arousable however extremely sleepy, able to answer orientation questions however slower mentally and somewhat lethargic.  Nurse paged MD; MD ordered 250 bolus and will come assess pt.

## 2012-06-07 NOTE — Progress Notes (Signed)
Patient ID: [name] [name], male   DOB: [birth year], 79 y.o.   MRN: [id number] 4 Days Post-Op  Subjective: Pt doing much better this morning.  Speak is [facility] clearer!!  MBSS today.  Objective: Vital signs in last 24 hours: Temp:  [97.7 F (36.5 C)-98.8 F (37.1 C)] 98.4 F (36.9 C) (03/21 0400) Pulse Rate:  [45-75] 70 (03/21 0400) Resp:  [17-31] 31 (03/21 0400) BP: (105-158)/(67-91) 121/75 mmHg (03/21 0400) SpO2:  [93 %-100 %] 97 % (03/21 0400) Last BM Date: 06/06/12  Intake/Output from previous day: 03/20 0701 - 03/21 0700 In: 872.5 [I.V.:572.5; IV Piggyback:300] Out: 1816 [Urine:1815; Stool:1] Intake/Output this shift:    PE: Abd: soft, NT, ND, +BS  Lab Results:   Recent Labs  06/06/12 0517 06/07/12 0405  WBC 11.3* 8.9  HGB 11.0* 11.2*  HCT 30.8* 32.3*  PLT 242 272   BMET  Recent Labs  06/06/12 0517 06/07/12 0405  NA 141 140  K 3.0* 2.7*  CL 104 100  CO2 26 29  GLUCOSE 114* 114*  BUN 16 16  CREATININE 0.80 0.89  CALCIUM 8.9 9.0   PT/INR No results found for this basename: LABPROT, INR,  in the last 72 hours CMP     Component Value Date/Time   NA 140 06/07/2012 0405   K 2.7* 06/07/2012 0405   CL 100 06/07/2012 0405   CO2 29 06/07/2012 0405   GLUCOSE 114* 06/07/2012 0405   BUN 16 06/07/2012 0405   CREATININE 0.89 06/07/2012 0405   CALCIUM 9.0 06/07/2012 0405   PROT 6.2 03/13/2011 0500   ALBUMIN 3.2* 03/13/2011 0500   AST 17 03/13/2011 0500   ALT 11 03/13/2011 0500   ALKPHOS 89 03/13/2011 0500   BILITOT 1.1 03/13/2011 0500   GFRNONAA 79* 06/07/2012 0405   GFRAA >90 06/07/2012 0405   Lipase  No results found for this basename: lipase       Studies/Results: Dg Chest Port 1 View  06/06/2012  *RADIOLOGY REPORT*  Clinical Data: Shortness of breath.  Congestion.  PORTABLE CHEST - 1 VIEW  Comparison: 06/05/2012.  Findings: Tortuous aorta particularly involving the ascending aspect.  Cardiomegaly.  Pulmonary vascular congestion most notable centrally   Poor inspiration.  Crowding markings lung bases versus atelectasis. Subtle infiltrate would be difficult to exclude in this setting. Appearance without significant change.  Right lung apex not entirely included present exam.  No gross pneumothorax.  IMPRESSION: Pulmonary vascular congestion most notable centrally.  Poor inspiration with basilar atelectasis.  Tortuous aorta.  Cardiomegaly.   Original Report Authenticated By: Lacy Duverney, M.D.     Anti-infectives: Anti-infectives   Start     Dose/Rate Route Frequency Ordered Stop   05/30/12 2200  ciprofloxacin (CIPRO) IVPB 400 mg  Status:  Discontinued     400 mg 200 mL/hr over 60 Minutes Intravenous Every 12 hours 05/30/12 2123 06/04/12 0746   05/30/12 2200  metroNIDAZOLE (FLAGYL) IVPB 500 mg  Status:  Discontinued     500 mg 100 mL/hr over 60 Minutes Intravenous Every 8 hours 05/30/12 2123 06/04/12 0746       Assessment/Plan  1. Adenocarcinoma, likely gastric primary with mets to colon, possible linitis plastica 2. ? New CVA 3. Dysphagia  Plan: 1. MBSS today, if he passes we can try a diet for him. 2. Dr. Truett Perna is out of town until Monday.  At that time, I will get in touch with him to determine further work-up needs for this patient, including probable PET scan as  outpatient.  Agree that patient needs to improve from medical standpoint and a lot of this oncologic work-up/treatment can be done as an outpatient 3. Will watch as diet is advanced to make sure patient doesn't have a partial obstruction on his colon that would require any type of surgical intervention while here.   LOS: 8 days    OSBORNE,KELLY E 06/07/2012, 8:37 AM Pager: [phone]  He seems to be improving.  He is tolerating clears without nausea or vomiting but he does have some increased distension today.  Slow with the diet.  We will plan for outpatient workup of the malignancy.

## 2012-06-07 NOTE — Progress Notes (Signed)
BP still soft and now down in the 80's. Has recently been diuresed due to flash pulmonary edema. Will stop Lasix for now, give 500cc bolus and start gentle IVF hydration at 50/hr. Agree with RN stopping Cardizem gtt but once BP more stable will need to resume since shifts quickly into HF when has RVR. Consideration shhould be given to anti-arrhythmic such as Amiodarone if hypotension persists.  Junious Silk, ANP

## 2012-06-07 NOTE — Progress Notes (Signed)
ANTICOAGULATION CONSULT NOTE Pharmacy Consult for heparin Indication: afib/CVA  Allergies  Allergen Reactions  . Lactose Intolerance (Gi)   . Penicillins Rash    Rash appeared on palms of hands and feet.    Patient Measurements: Height: 6\' 1"  (185.4 cm) Weight: 188 lb 0.8 oz (85.3 kg) IBW/kg (Calculated) : 79.9  Vital Signs: Temp: 98.8 F (37.1 C) (03/21 0000) Temp src: Axillary (03/21 0000) BP: 126/67 mmHg (03/21 0000) Pulse Rate: 45 (03/21 0000)  Labs:  Recent Labs  06/05/12 0355 06/05/12 0650 06/05/12 1140 06/05/12 1709  06/06/12 0517 06/06/12 0807 06/06/12 1748 06/07/12 0405  HGB 11.3*  --   --   --   --  11.0*  --   --  11.2*  HCT 31.6*  --   --   --   --  30.8*  --   --  32.3*  PLT 243  --   --   --   --  242  --   --  272  HEPARINUNFRC  --   --   --   --   < >  --  0.15* 0.24* 0.18*  CREATININE 0.84  --   --   --   --  0.80  --   --   --   TROPONINI  --  0.35* 0.33* <0.30  --   --   --   --   --   < > = values in this interval not displayed.  Estimated Creatinine Clearance: 84.6 ml/min (by C-G formula based on Cr of 0.8).    Assessment: 79 yo male with afib for heparin Goal of Therapy:  Heparin level= 0.3-0.5 Monitor platelets by anticoagulation protocol: Yes   Plan:  Increase Heparin 1700 units/hr Check heparin level in 8 hours.  Geannie Risen, PharmD, BCPS  06/07/2012 4:56 AM

## 2012-06-07 NOTE — Progress Notes (Signed)
ANTICOAGULATION CONSULT NOTE Pharmacy Consult for heparin Indication: afib/CVA  Allergies  Allergen Reactions  . Lactose Intolerance (Gi)   . Penicillins Rash    Rash appeared on palms of hands and feet.    Labs:  Recent Labs  06/05/12 0355 06/05/12 0650 06/05/12 1140 06/05/12 1709  06/06/12 0517  06/06/12 1748 06/07/12 0405 06/07/12 1210  HGB 11.3*  --   --   --   --  11.0*  --   --  11.2*  --   HCT 31.6*  --   --   --   --  30.8*  --   --  32.3*  --   PLT 243  --   --   --   --  242  --   --  272  --   HEPARINUNFRC  --   --   --   --   < >  --   < > 0.24* 0.18* 0.48  CREATININE 0.84  --   --   --   --  0.80  --   --  0.89  --   TROPONINI  --  0.35* 0.33* <0.30  --   --   --   --   --   --   < > = values in this interval not displayed.  Estimated Creatinine Clearance: 76.1 ml/min (by C-G formula based on Cr of 0.89).  Assessment: 79 yo male with afib for heparin,  Heparin level therapeutic at 0.48  Goal of Therapy:  Heparin level= 0.3-0.5 Monitor platelets by anticoagulation protocol: Yes   Plan:  Continue heparin at 1700 units/hr Follow up AM heparin level, CBC  Thank you. Okey Regal, PharmD   06/07/2012 12:58 PM

## 2012-06-07 NOTE — Progress Notes (Signed)
Hypokalemia   K replaced  

## 2012-06-07 NOTE — Procedures (Signed)
Objective Swallowing Evaluation: Modified Barium Swallowing Study  Patient Details  Name: ROBBIN ESCHER MRN: [id number] Date of Birth: 26-Oct-1930  Today's Date: 06/07/2012 Time: 0940-1005 SLP Time Calculation (min): 25 min  Past Medical History:  Past Medical History  Diagnosis Date  . Ramsay Hunt auricular syndrome   . HTN (hypertension)   . Osteoarthritis   . Dysrhythmia 04/17/12  . Anxiety   . Stroke   . H/O Bell's palsy at age 79  . Atrial flutter   . Atrial fibrillation    Past Surgical History:  Past Surgical History  Procedure Laterality Date  . Hernia repair    . Vasectomy    . Prostate biopsies      per Vonita Moss  . Cataract extraction      SE eye  . Colonoscopy N/A 06/03/2012    Procedure: COLONOSCOPY;  Surgeon: Florencia Reasons, MD;  Location: [facility] ENDOSCOPY;  Service: Endoscopy;  Laterality: N/A;  . Esophagogastroduodenoscopy N/A 06/03/2012    Procedure: ESOPHAGOGASTRODUODENOSCOPY (EGD);  Surgeon: Florencia Reasons, MD;  Location: [facility] ENDOSCOPY;  Service: Endoscopy;  Laterality: N/A;   HPI:  79 yo WM with a pmh of stroke/caf on pradaxa who presented to [facility] ED 3-13 with 2 days of abd pain and distension. Admitted to hospital and underwent EGD 3-17 and multiple bxs were obtained the gastric and the colonic biopsies show malignancy, probable adenocarcinoma. He developed increased wob and clinical findings consistent with pulmonary edema(crackles) and was transferred to ICU from SDU. He did not tolerate NIMVS (claustrophobia) but responded well to lasix and NTG. Note he has speech changes (dysarthia and thick tongue) CT head 3-19 with no new changes. Upper GI on 01/04/12 shows Moderate tertiary contractions in the mid and distal esophagus, swallowing mechanism normal. Bedside swallow revealed concerning signs of aspiration, objective test needed.      Assessment / Plan / Recommendation Clinical Impression  Dysphagia Diagnosis: Mild oral phase dysphagia;Mild pharyngeal  phase dysphagia;Mild cervical esophageal phase dysphagia;Suspected primary esophageal dysphagia Clinical impression: Pt demonstrates a mild oral, pharyngeal and cervical esohageal phase dysphagia, which are likely all part of pts baseline function. Pt has a baseline left lingual, labial weakness resulting in mild anterior spillage on the left and oral residuals. Pt is able to improve oral control by consuming liquids with a straw place in the right side of the mouth. There is a mild sensory motor pharyngeal deficits associated with decreased base of tongue retraction and epiglottic deflection. The swallow is delayed to the level of the pyriform sinuses with thin liquids. Mild, unsensed residual remains post swallow with liquids and solids likely due to weakness and limited passage of bolus through UES. There was no penetration or aspiration observed during this study.   Also of concern was the appearance of poor esophageal transit of soldis and liquids. Stasis remains throughout the esophageal column.  Pt is recommended to consume thin liquids, will defer to surgery for diet textuere given GI pathology. Pt is physically able to masticate regular solids but may need softer texture for digestion. Offered pt and family compensatory strategies to clear residuals, and esophageal precautions written and verbal. Pt and wife verbalize and demonstrate understanding. As pt's speech is much improved today, no further SLP f/u needed at this time.     Treatment Recommendation  No treatment recommended at this time    Diet Recommendation Thin liquid   Liquid Administration via: Straw Medication Administration: Whole meds with liquid Supervision: Patient able to self feed;Full supervision/cueing for  compensatory strategies Compensations: Slow rate;Small sips/bites;Check for anterior loss;Multiple dry swallows after each bite/sip;Follow solids with liquid Postural Changes and/or Swallow Maneuvers: Seated upright 90  degrees;Upright 30-60 min after meal    Other  Recommendations Oral Care Recommendations: Oral care BID   Follow Up Recommendations  None    Frequency and Duration        Pertinent Vitals/Pain NA    SLP Swallow Goals     General HPI: 79 yo WM with a pmh of stroke/caf on pradaxa who presented to [facility] ED 3-13 with 2 days of abd pain and distension. Admitted to hospital and underwent EGD 3-17 and multiple bxs were obtained the gastric and the colonic biopsies show malignancy, probable adenocarcinoma. He developed increased wob and clinical findings consistent with pulmonary edema(crackles) and was transferred to ICU from SDU. He did not tolerate NIMVS (claustrophobia) but responded well to lasix and NTG. Note he has speech changes (dysarthia and thick tongue) CT head 3-19 with no new changes. Upper GI on 01/04/12 shows Moderate tertiary contractions in the mid and distal esophagus, swallowing mechanism normal. Bedside swallow revealed concerning signs of aspiration, objective test needed.  Type of Study: Modified Barium Swallowing Study Reason for Referral: Objectively evaluate swallowing function Previous Swallow Assessment: noe Diet Prior to this Study: NPO Temperature Spikes Noted: No Respiratory Status: Room air History of Recent Intubation: No Behavior/Cognition: Cooperative;Pleasant mood;Lethargic Oral Cavity - Dentition: Adequate natural dentition Oral Motor / Sensory Function: Impaired - see Bedside swallow eval Self-Feeding Abilities: Able to feed self Patient Positioning: Upright in chair Baseline Vocal Quality: Clear Volitional Cough: Strong Volitional Swallow: Able to elicit Anatomy: Other (Comment) (Appearance of osteophytic changes at C5/6/7) Pharyngeal Secretions: Not observed secondary MBS    Reason for Referral Objectively evaluate swallowing function   Oral Phase Oral Preparation/Oral Phase Oral Phase: Impaired Oral - Thin Oral - Thin Cup: Left anterior bolus  loss;Weak lingual manipulation;Left pocketing in lateral sulci;Pocketing in anterior sulcus;Lingual/palatal residue Oral - Thin Straw: Weak lingual manipulation;Lingual/palatal residue Oral - Solids Oral - Puree: Weak lingual manipulation;Lingual/palatal residue Oral - Regular: Weak lingual manipulation;Lingual/palatal residue Oral - Pill: Lingual/palatal residue   Pharyngeal Phase Pharyngeal Phase Pharyngeal Phase: Impaired Pharyngeal - Thin Pharyngeal - Thin Cup: Delayed swallow initiation;Premature spillage to pyriform sinuses;Reduced tongue base retraction;Reduced epiglottic inversion;Pharyngeal residue - valleculae;Pharyngeal residue - pyriform sinuses;Pharyngeal residue - cp segment Pharyngeal - Thin Straw: Delayed swallow initiation;Premature spillage to pyriform sinuses;Reduced tongue base retraction;Reduced epiglottic inversion;Pharyngeal residue - valleculae;Pharyngeal residue - pyriform sinuses;Pharyngeal residue - cp segment Pharyngeal - Solids Pharyngeal - Puree: Delayed swallow initiation;Reduced tongue base retraction;Reduced epiglottic inversion;Pharyngeal residue - valleculae;Pharyngeal residue - pyriform sinuses Pharyngeal - Regular: Delayed swallow initiation;Reduced tongue base retraction;Reduced epiglottic inversion;Pharyngeal residue - valleculae;Pharyngeal residue - pyriform sinuses  Cervical Esophageal Phase    GO    Cervical Esophageal Phase Cervical Esophageal Phase: Impaired Cervical Esophageal Phase - Comment Cervical Esophageal Comment: Due to apperance of impingement form cervical spine at C5 to C7 there was reduced opening othe UES with pild cervical esophageal residuals and 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 There was also mild residual in pyriform sinuses due to decreased transit of bolus. No radiologist present to confirm.          Aldrich Lloyd, Riley Nearing 06/07/2012, 10:31 AM

## 2012-06-07 NOTE — Progress Notes (Signed)
NUTRITION FOLLOW UP  Intervention:   - Diet advancement per MD - Resource Breeze TID. Each supplement provides 250 kcal and 9 grams of protein.  - Pt is at increased risk for refeeding syndrome, recommend monitor Mag, Phos, K+ x3 days once diet or nutrition support is initiated and replete as needed.  -  RD will continue to follow    Nutrition Dx:   Inadequate oral intake related to pain and decreased appetite 2/2 gastropariesis as evidenced by 13% weight loss in 6 weeks. Ongoing   Goal:   Pt to meet >/= 90% of estimated needs. Unmet   Monitor:   Diet advance need for TPN, weight trends, labs   Assessment:   Pt remains NPO/CL diet, thought to have ischemic colitis with ileus per endoscopy/colonoscopy. Pt previously had a NG tube for suction, but since pt pulled it out. As of 3/18 pt developed flash pulmonary edema and slurred speech.  Pt volume overloaded due to acute on chronic CHF. Pt on IV Lasix.   3/19 - biopsy reveals stomach cancer metastatic to the colon and peritoneum. This cancer is treatable but not curable. Patient seen by palliative care preliminary 3/20 and meeting scheduled for 3/21.    Pt has been NPO x 8 days. Was scheduled for swallow evaluation for diet advancement 3/19 but was rescheduled to 3/20. After bedside swallow evaluation, SLP recommended continuing NPO until MBS on 3/21.  MBS performed, SLP diagnoses pt with mild dysphagia. SLP recommending thin liquids with a straw. SLP noted pt is physically able to chew regular solids but may need a softer texture for digestion. SLP defer diet texture to GI.   Given pt's prolonged NPO status, and weight loss PTA, pt is at increased risk for refeeding syndrome. Recommend monitor Phos, Mag, K+ x3 days once diet/nutrition is initiated.     Height: Ht Readings from Last 1 Encounters:  05/30/12 6\' 1"  (1.854 m)    Weight Status:   Wt Readings from Last 1 Encounters:  06/06/12 188 lb 0.8 oz (85.3 kg)  05/30/12 187 lbs    Re-estimated needs:  Kcal: 1950- 2150 kcal  Protein: 105 0 120 gm  Fluid: >/= 2 L   Skin: intact  Diet Order: NPO   Intake/Output Summary (Last 24 hours) at 06/07/12 0841 Last data filed at 06/07/12 0600  Gross per 24 hour  Intake  849.5 ml  Output   1751 ml  Net -901.5 ml    Last BM: 06/06/2012   Labs:   Recent Labs Lab 06/04/12 0400 06/05/12 0355 06/06/12 0517 06/07/12 0405  NA 139 141 141 140  K 3.7 3.2* 3.0* 2.7*  CL 105 107 104 100  CO2 20 21 26 29   BUN 15 15 16 16   CREATININE 0.87 0.84 0.80 0.89  CALCIUM 8.8 8.8 8.9 9.0  MG 1.7  --  1.7 1.9  PHOS  --   --  2.6 2.9  GLUCOSE 107* 121* 114* 114*    CBG (last 3)   Recent Labs  06/05/12 0555  GLUCAP 150*    Scheduled Meds: . furosemide  40 mg Intravenous BID  . LORazepam  0.5 mg Intravenous Once  . metoprolol  5 mg Intravenous Q6H  . pantoprazole (PROTONIX) IV  40 mg Intravenous Q12H  . potassium chloride  10 mEq Intravenous Q1 Hr x 6  . sodium chloride  3 mL Intravenous Q12H    Continuous Infusions: . diltiazem (CARDIZEM) infusion 10 mg/hr (06/06/12 1230)  . heparin 1,700  Units/hr (06/07/12 0515)    Belenda Cruise  Dietetic Intern Pager: [phone]

## 2012-06-07 NOTE — Evaluation (Signed)
Physical Therapy Evaluation Patient Details Name: [name] [name] MRN: [id number] DOB: 1930/10/23 Today's Date: 06/07/2012 Time: 4098-1191 PT Time Calculation (min): 18 min  PT Assessment / Plan / Recommendation Clinical Impression  Patient is a 79 y/o male admitted with syncope, abdominal pain and distension and coffee ground emesis and diarrhea.  Found to have moderate ascites with diffuse fluid filled and dilated small bowel likely mechanical small bowel obstruction with ischemic/infectious and inflammatory etiologies. Subsequent testing with pathology positive for adenocarcinoma.  Patient presents with decreased strength right more than left in extremities.  Mobility eval deferred today due to nurse requesting to hold off due to low BP after initially stating okay for OOB.  Will continue assessment next session regarding post-acute PT needs.  Will follow acutely.     PT Assessment  Patient needs continued PT services    Follow Up Recommendations  Other (comment) (TBA pending further evaluaion)    Does the patient have the potential to tolerate intense rehabilitation      Barriers to Discharge None      Equipment Recommendations  Other (comment) (TBA pending further evaluation)    Recommendations for Other Services     Frequency Min 3X/week    Precautions / Restrictions   Low BP  Pertinent Vitals/Pain Denies pain      Mobility  Bed Mobility Details for Bed Mobility Assistance: mobility eval deferred due to RN states BP too low to get up.  Stated to recieve fluid bolus.        PT Diagnosis: Generalized weakness  PT Problem List: Decreased strength;Decreased activity tolerance;Cardiopulmonary status limiting activity PT Treatment Interventions: DME instruction;Gait training;Functional mobility training;Stair training;Therapeutic activities;Therapeutic exercise;Patient/family education;Balance training   PT Goals Acute Rehab PT Goals PT Goal Formulation: With  patient/family Time For Goal Achievement: 06/21/12 Potential to Achieve Goals: Fair Pt will go Supine/Side to Sit: with min assist PT Goal: Supine/Side to Sit - Progress: Goal set today Pt will go Sit to Supine/Side: with min assist PT Goal: Sit to Supine/Side - Progress: Goal set today Pt will go Sit to Stand: with min assist PT Goal: Sit to Stand - Progress: Goal set today Pt will go Stand to Sit: with supervision PT Goal: Stand to Sit - Progress: Goal set today Pt will Stand: with min assist;3 - 5 min;with unilateral upper extremity support PT Goal: Stand - Progress: Goal set today Pt will Ambulate: 16 - 50 feet;with min assist;with least restrictive assistive device PT Goal: Ambulate - Progress: Goal set today Pt will Go Up / Down Stairs: 3-5 stairs;with min assist PT Goal: Up/Down Stairs - Progress: Goal set today  Visit Information  Last PT Received On: 06/07/12    Subjective Data  Subjective: I think it is this bed makes my neck so stiff Patient Stated Goal: To return home   Prior Functioning  Home Living Lives With: Spouse Available Help at Discharge: Available 24 hours/day Type of Home: House Home Access: Stairs to enter [employer] of Steps: 3 Home Layout: Two level;Able to live on main level with bedroom/bathroom Alternate Level Stairs-Number of Steps: can stay on main Bathroom Shower/Tub: Health visitor: Standard (has a half wall next to toilet) Home Adaptive Equipment: None Prior Function Level of Independence: Independent Vocation: Retired Comments: was driving some first part of year prior to having a stroke on the way to the beach Communication Communication: No difficulties    Cognition  Cognition Overall Cognitive Status: Appears within functional limits for tasks assessed/performed  Arousal/Alertness: Awake/alert Orientation Level: Appears intact for tasks assessed Behavior During Session: [facility] for tasks performed     Extremity/Trunk Assessment Right Upper Extremity Assessment RUE ROM/Strength/Tone: Deficits RUE ROM/Strength/Tone Deficits: AROM shoulder flexion about 100 degrees limited by pain, strength shoulder flexion 3+/5 with pain, elbow flexion 4/5, extension 3+/5 RUE Sensation: WFL - Light Touch Left Upper Extremity Assessment LUE ROM/Strength/Tone: Deficits LUE ROM/Strength/Tone Deficits: AROM WFL, strength shoulder flexion 4/5, elbow flexion 4+/5, extension 4/5 LUE Sensation: WFL - Light Touch Right Lower Extremity Assessment RLE ROM/Strength/Tone: Deficits RLE ROM/Strength/Tone Deficits: AROM WFL, strength hip flexion 4-/5, knee extension 4/5, ankle dorsiflexion 4/5 RLE Sensation: WFL - Light Touch Left Lower Extremity Assessment LLE ROM/Strength/Tone: Deficits LLE ROM/Strength/Tone Deficits: AROM WFL, hip flexion 4/5, knee extension 4+/5, ankle dorsiflexion 4+/5 LLE Sensation: WFL - Light Touch Trunk Assessment Trunk Assessment: Other exceptions Trunk Exceptions: cervical AROM limited with pain and stiffness keeps head turned left, unable to turn towards right when lying in bed   Balance Balance Balance Assessed: No  End of Session PT - End of Session Activity Tolerance: Treatment limited secondary to medical complications (Comment) (low BP ) Patient left: in bed;with family/visitor present;with nursing in room  GP     [facility] 06/07/2012, 4:54 PM Sheran Lawless, PT [phone] 06/07/2012

## 2012-06-07 NOTE — Progress Notes (Signed)
Agree with student dietitian note.  [name] Klooster, MS RD LDN Clinical Inpatient Dietitian Pager: [phone] Weekend/After hours pager: [phone]  

## 2012-06-07 NOTE — Progress Notes (Signed)
SBP subtle trend down in setting of recent Ativan IV- as precaution have asked RN to hold next dose of IV Lopressor  Junious Silk, ANP

## 2012-06-07 NOTE — Progress Notes (Signed)
Patient Name: [name] [name] Date of Encounter: 06/07/2012  Principal Problem:   Acute respiratory distress Active Problems:   Gastroparesis   Atrial fibrillation, rapid   GI bleed   Syncope   Acute encephalopathy   Dysarthria, acute   Paroxysmal a-fib/flutter    SUBJECTIVE: A&O x 3 this am, mental status much improved. Pt had a good night, got some rest. Breathing better, no chest pain. Has a crick in the right side of his neck.  OBJECTIVE Filed Vitals:   06/06/12 1600 06/06/12 2000 06/07/12 0000 06/07/12 0400  BP:  122/77 126/67 121/75  Pulse:  75 45 70  Temp: 98 F (36.7 C) 97.9 F (36.6 C) 98.8 F (37.1 C) 98.4 F (36.9 C)  TempSrc:  Oral Axillary Oral  Resp:  29 26 31   Height:      Weight:      SpO2:  98% 93% 97%    Intake/Output Summary (Last 24 hours) at 06/07/12 0654 Last data filed at 06/07/12 0600  Gross per 24 hour  Intake  895.5 ml  Output   1816 ml  Net -920.5 ml   Filed Weights   05/30/12 0840 05/30/12 2015 06/06/12 0500  Weight: 185 lb (83.915 kg) 187 lb 2.7 oz (84.9 kg) 188 lb 0.8 oz (85.3 kg)    PHYSICAL EXAM General: Well developed, well nourished, male in no acute distress. Head: Normocephalic, atraumatic.  Neck: Supple without bruits, JVD at 9 cm. Lungs:  Resp regular and unlabored, decreased BS bases with rales and ?few crackles. Heart: RRR, S1, S2, no S3, S4, or murmur; no rub. Abdomen: Soft, non-tender, non-distended, BS + x 4.  Extremities: No clubbing, cyanosis, no edema.  Neuro: Alert and oriented X 2. Moves all extremities spontaneously. Psych: Normal affect.  LABS: CBC: Recent Labs  06/06/12 0517 06/07/12 0405  WBC 11.3* 8.9  HGB 11.0* 11.2*  HCT 30.8* 32.3*  MCV 93.3 94.4  PLT 242 272   Basic Metabolic Panel: Recent Labs  06/06/12 0517 06/07/12 0405  NA 141 140  K 3.0* 2.7*  CL 104 100  CO2 26 29  GLUCOSE 114* 114*  BUN 16 16  CREATININE 0.80 0.89  CALCIUM 8.9 9.0  MG 1.7 1.9  PHOS 2.6 2.9    Cardiac Enzymes: Recent Labs  06/05/12 0650 06/05/12 1140 06/05/12 1709  TROPONINI 0.35* 0.33* <0.30   BNP: Pro B Natriuretic peptide (BNP)  Date/Time Value Range Status  06/05/2012 11:40 AM 4698.0* 0 - 450 pg/mL Final   Anemia Panel: Recent Labs  06/04/12 1348  VITAMINB12 887    TELE: SR, no afib, PACs and episode ?WAP    Radiology/Studies: Dg Chest Port 1 View  06/06/2012  *RADIOLOGY REPORT*  Clinical Data: Shortness of breath.  Congestion.  PORTABLE CHEST - 1 VIEW  Comparison: 06/05/2012.  Findings: Tortuous aorta particularly involving the ascending aspect.  Cardiomegaly.  Pulmonary vascular congestion most notable centrally  Poor inspiration.  Crowding markings lung bases versus atelectasis. Subtle infiltrate would be difficult to exclude in this setting. Appearance without significant change.  Right lung apex not entirely included present exam.  No gross pneumothorax.  IMPRESSION: Pulmonary vascular congestion most notable centrally.  Poor inspiration with basilar atelectasis.  Tortuous aorta.  Cardiomegaly.   Original Report Authenticated By: Lacy Duverney, M.D.    Current Medications:  . LORazepam  0.5 mg Intravenous Once  . metoprolol  5 mg Intravenous Q6H  . pantoprazole (PROTONIX) IV  40 mg Intravenous Q12H  . potassium  chloride  10 mEq Intravenous Q1 Hr x 4  . sodium chloride  3 mL Intravenous Q12H   . diltiazem (CARDIZEM) infusion 10 mg/hr (06/06/12 1230)  . heparin 1,700 Units/hr (06/07/12 0515)    ASSESSMENT AND PLAN: Principal Problem:   Acute respiratory distress Active Problems:   Gastroparesis   Atrial fibrillation, rapid   GI bleed   Syncope   Acute encephalopathy   Dysarthria, acute   Paroxysmal a-fib/flutter   [name] [name] is a 79 year old male with a history of atrial fibrillation. Previously on Pradaxa, which was held upon admission because of a GI bleed. Biopsies showed CA. Has been in and out of afib since admission, rate control with IV  Cardizem when needed. Scheduled BB. Started on heparin 3/19 for new slurred speech and concern for new CVA. Dr. 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   1. Atrial fibrillation (?paroxysmal): Currently in NSR with rate control on Cardizem dripwhen needed and on scheduled metoprolol . Currently HR low at times. See tele strips for ?WAP but no afib . Continue current meds today.  IV Rx only for now.   2. Volume overload: Likely acute diastolic CHF. Systolic function normal on echo 3/18. No significant valvular disease. Disuresed yesterdy still elevated volume status today. Received Lasix total 40 mg IV BID yesterday, will give 40mg  IV BID today.   3. Possible CVA/slurred speech: Since 3/19. CT head with no acute changes. He has had multiple neurological events over the last 18 months.He had a CVA on 03/12/11 and was admitted to [facility]. Echo with normal LV function. Carotid artery dopplers were normal. He described right sided arm and leg weakness. He completely recovered neurologically. He was started on Plavix in the hospital after his stroke in 2012. He was hospitalized in [place], [place] 1/26-1/28/14 for TIA vs CVA. During hospitalization (per records in paper chart) echo again showed nl LV systolic fxn 55-65%, no WMAs, or cardiac source of emboli. ASA was added to his regimen. He was discharged home but was admitted to [facility] on 04/17/12 with speech deficits. In the ED EKG showed Atrial Flutter w/ variable conduction 95bpm, nonspecific ST/T changes. He spontaneously converted to NSR. His speech returned to normal after about one hour. His ASA and Plavix was stopped and he was started on Pradaxa for anticoagulation. This admission, Pradaxa held for GI  bleeding. Neurology consult considered but not done yet. Possible new CVA 3/19, although patient refused MRI. Treated with heparin currently.  4. Stage 4 metastatic linitis plastica - managed by oncology, reason for palliative care consult.   5. Hypokalemia - supplement 10 meq x 3 yest, K+ went down with diuresis, will give 6 runs today and follow   6. Swallowing - Pt failed swallow study yest, all Rx is IV for now.  Otherwise, per primary MD. Signed, Theodore Demark , PA-C 6:54 AM 06/07/2012  patient seen and examined  Agree with findings of R Barrett. JVP is mildly increased.  Card:  RRR.  No signif murmurs  Lungs  Rel clear anteriorly  Ext  No signif edema. Patient's breathing is much more comfortable per his and family report.  Will check BNP   In SR. Continue anticoag for now. Watch I/Os Repleting K  Recheck this afternoon. Neuro  to see re CVA/TIA  [name] [name]

## 2012-06-07 NOTE — Consult Note (Signed)
Reason for Consult:dysarthria Referring Physician: Maretta Bees  CC: Dysarthria  History is obtained from:PAtient, wife  HPI: [name] [name] is a 79 y.o. male with a history of atrial fibrillation and new diagnosis of adenocarcinoma with GI bleed who was admitted with hemetemesis and found to have gastric cancer. He had some delirium while in the ICU with visual hallucinations, however even as that was clearing, he has had some persistent dysarthria, though it does seem to be improving.   LKW: 3/19 tpa given: no, anticoagulation, GI bleed  ROS: A 14 point ROS was performed and is negative except as noted in the HPI.  Past Medical History  Diagnosis Date  . Ramsay Hunt auricular syndrome   . HTN (hypertension)   . Osteoarthritis   . Dysrhythmia 04/17/12  . Anxiety   . Stroke   . H/O Bell's palsy at age 43  . Atrial flutter   . Atrial fibrillation     Family History: Cancer, DM  Social History: Tob: former smoker  Exam: Current vital signs: BP 97/58  Pulse 73  Temp(Src) 98.2 F (36.8 C) (Oral)  Resp 23  Ht 6\' 1"  (1.854 m)  Wt 85.3 kg (188 lb 0.8 oz)  BMI 24.82 kg/m2  SpO2 100% Vital signs in last 24 hours: Temp:  [97.9 F (36.6 C)-98.8 F (37.1 C)] 98.2 F (36.8 C) (03/21 0800) Pulse Rate:  [45-99] 73 (03/21 1514) Resp:  [19-31] 23 (03/21 1514) BP: (83-130)/(53-94) 97/58 mmHg (03/21 1514) SpO2:  [93 %-100 %] 100 % (03/21 0800)  General: in bed, NAD CV: irregular Mental Status: Patient is awake, alert, oriented to person, place, month, year, and situation. Immediate and remote memory are intact. Patient is able to give a clear and coherent history. Able to spell world backwards without difficulty Ablet o give # of quarters in $2.75 Patietn occasionally does slur his words.  Cranial Nerves: II: Visual Fields are full. Pupils are equal, round, and reactive to light.  Discs are sharp. III,IV, VI: EOMI without ptosis or diploplia.  V: Facial sensation is  symmetric to temperature VII: Facial movement is symmetric.  VIII: hearing is intact to voice X: Uvula elevates symmetrically XI: Shoulder shrug is symmetric. XII: tongue is midline without atrophy or fasciculations.  Motor: Tone is normal. Bulk is normal. 5/5 strength was present in all four extremities.  Sensory: Sensation is symmetric to light touch and temperature in the arms and legs. Deep Tendon Reflexes: 2+ and symmetric in the biceps and patellae.  Cerebellar: FNF with intention tremor bilaterally Gait: Not tested due to patient safety concerns.    I have reviewed labs in epic and the results pertinent to this consultation are: Hypokalemia theraputic heparin level.   I have reviewed the images obtained:CT head - no acute findings.   Impression: 79 yo M with new dysarthria in the setting of holding anticoagulation for GI bleed, now back on anticoagulation per primary team.   Isolated dysarthria is possible to be a new stroke, however with his report of a sore tongue and recent delirium, I do not think that it is definite at this time.   Recommendations: 1) MRI brain when patient is stable enough to accomplish.  2) If felt to be safe to anticoagulate from GI perspective, I feel that it is safe from neurological perspective as well given that even if he has had a stroke, it would be quite small and he would now be multiple days out and therefore risks may balance benefits,  though would keep him on lower end of theraputic range for now.    Ritta Slot, MD [facility] [phone]  If 7pm- 7am, please page neurology on call at [phone].

## 2012-06-08 LAB — CBC
MCH: 33.2 pg (ref 26.0–34.0)
MCHC: 34.9 g/dL (ref 30.0–36.0)
Platelets: 301 10*3/uL (ref 150–400)
RDW: 13.3 % (ref 11.5–15.5)

## 2012-06-08 LAB — BASIC METABOLIC PANEL
CO2: 26 mEq/L (ref 19–32)
Calcium: 8.6 mg/dL (ref 8.4–10.5)
Creatinine, Ser: 1 mg/dL (ref 0.50–1.35)
GFR calc Af Amer: 80 mL/min — ABNORMAL LOW (ref >90)
GFR calc non Af Amer: 69 mL/min — ABNORMAL LOW (ref >90)
Sodium: 138 mEq/L (ref 135–145)

## 2012-06-08 LAB — HEPARIN LEVEL (UNFRACTIONATED): Heparin Unfractionated: 0.16 IU/mL — ABNORMAL LOW (ref 0.30–0.70)

## 2012-06-08 LAB — CORTISOL: Cortisol, Plasma: 15.9 ug/dL

## 2012-06-08 MED ORDER — POTASSIUM CHLORIDE CRYS ER 20 MEQ PO TBCR
40.0000 meq | EXTENDED_RELEASE_TABLET | Freq: Two times a day (BID) | ORAL | Status: AC
  Administered 2012-06-08 – 2012-06-09 (×3): 40 meq via ORAL
  Filled 2012-06-08 (×4): qty 2

## 2012-06-08 MED ORDER — METOPROLOL TARTRATE 1 MG/ML IV SOLN
5.0000 mg | Freq: Three times a day (TID) | INTRAVENOUS | Status: DC
  Administered 2012-06-09 – 2012-06-10 (×4): 5 mg via INTRAVENOUS
  Filled 2012-06-08 (×8): qty 5

## 2012-06-08 MED ORDER — ACETAMINOPHEN 325 MG PO TABS
650.0000 mg | ORAL_TABLET | Freq: Four times a day (QID) | ORAL | Status: DC | PRN
  Administered 2012-06-17: 650 mg via ORAL
  Filled 2012-06-08: qty 2

## 2012-06-08 MED ORDER — FUROSEMIDE 10 MG/ML IJ SOLN
40.0000 mg | Freq: Once | INTRAMUSCULAR | Status: AC
  Administered 2012-06-08: 40 mg via INTRAVENOUS
  Filled 2012-06-08: qty 4

## 2012-06-08 MED ORDER — PANTOPRAZOLE SODIUM 40 MG PO PACK
40.0000 mg | PACK | Freq: Two times a day (BID) | ORAL | Status: DC
  Administered 2012-06-08 – 2012-06-11 (×8): 40 mg via ORAL
  Filled 2012-06-08 (×12): qty 20

## 2012-06-08 MED ORDER — RIVAROXABAN 20 MG PO TABS
20.0000 mg | ORAL_TABLET | Freq: Every day | ORAL | Status: DC
  Administered 2012-06-08 – 2012-06-11 (×4): 20 mg via ORAL
  Filled 2012-06-08 (×5): qty 1

## 2012-06-08 NOTE — Progress Notes (Signed)
Patient ID: [name] [name], male   DOB: 1930-09-18, 79 y.o.   MRN: [id number] 5 Days Post-Op  Subjective: Pt feels ok, but feels more bloated today.  No nausea with clears, but only minimal flatus  Objective: Vital signs in last 24 hours: Temp:  [97.6 F (36.4 C)-98.2 F (36.8 C)] 98.2 F (36.8 C) (03/22 0800) Pulse Rate:  [64-99] 73 (03/22 0800) Resp:  [19-28] 23 (03/22 0800) BP: (82-117)/(53-94) 105/75 mmHg (03/22 0800) SpO2:  [92 %-100 %] 100 % (03/22 0800) Last BM Date: 06/06/12  Intake/Output from previous day: 03/21 0701 - 03/22 0700 In: 1467.4 [P.O.:120; I.V.:1247.4; IV Piggyback:100] Out: 1150 [Urine:1150] Intake/Output this shift:    PE: Abd: soft, but more distended today, +BS, minimal pain, but more than he has had Heart: regular Lungs: CTAB  Lab Results:   Recent Labs  06/07/12 0405 06/08/12 0535  WBC 8.9 7.9  HGB 11.2* 11.7*  HCT 32.3* 33.5*  PLT 272 301   BMET  Recent Labs  06/07/12 1931 06/08/12 0535  NA 137 138  K 3.4* 3.5  CL 101 102  CO2 29 26  GLUCOSE 145* 117*  BUN 20 22  CREATININE 1.03 1.00  CALCIUM 8.4 8.6   PT/INR No results found for this basename: LABPROT, INR,  in the last 72 hours CMP     Component Value Date/Time   NA 138 06/08/2012 0535   K 3.5 06/08/2012 0535   CL 102 06/08/2012 0535   CO2 26 06/08/2012 0535   GLUCOSE 117* 06/08/2012 0535   BUN 22 06/08/2012 0535   CREATININE 1.00 06/08/2012 0535   CALCIUM 8.6 06/08/2012 0535   PROT 6.2 03/13/2011 0500   ALBUMIN 3.2* 03/13/2011 0500   AST 17 03/13/2011 0500   ALT 11 03/13/2011 0500   ALKPHOS 89 03/13/2011 0500   BILITOT 1.1 03/13/2011 0500   GFRNONAA 69* 06/08/2012 0535   GFRAA 80* 06/08/2012 0535   Lipase  No results found for this basename: lipase       Studies/Results: Dg Swallowing Func-speech Pathology  06/07/2012  Riley Nearing Deblois, CCC-SLP     06/07/2012 10:39 AM Objective Swallowing Evaluation: Modified Barium Swallowing Study   Patient Details   Name: [name] [name] MRN: [id number] Date of Birth: [birth year]  Today's Date: 06/07/2012 Time: 0940-1005 SLP Time Calculation (min): 25 min  Past Medical History:  Past Medical History  Diagnosis Date  . Ramsay Hunt auricular syndrome   . HTN (hypertension)   . Osteoarthritis   . Dysrhythmia 04/17/12  . Anxiety   . Stroke   . H/O Bell's palsy at age 39  . Atrial flutter   . Atrial fibrillation    Past Surgical History:  Past Surgical History  Procedure Laterality Date  . Hernia repair    . Vasectomy    . Prostate biopsies      per Vonita Moss  . Cataract extraction      SE eye  . Colonoscopy N/A 06/03/2012    Procedure: COLONOSCOPY;  Surgeon: Florencia Reasons, MD;   Location: [facility] ENDOSCOPY;  Service: Endoscopy;  Laterality: N/A;  . Esophagogastroduodenoscopy N/A 06/03/2012    Procedure: ESOPHAGOGASTRODUODENOSCOPY (EGD);  Surgeon: Florencia Reasons, MD;  Location: [facility] ENDOSCOPY;  Service: Endoscopy;   Laterality: N/A;   HPI:  79 yo WM with a pmh of stroke/caf on pradaxa who presented to  [facility] ED 3-13 with 2 days of abd pain and distension. Admitted to  hospital and underwent EGD 3-17 and multiple bxs  were obtained  the gastric and the colonic biopsies show malignancy, probable  adenocarcinoma. He developed increased wob and clinical findings  consistent with pulmonary edema(crackles) and was transferred to  ICU from SDU. He did not tolerate NIMVS (claustrophobia) but  responded well to lasix and NTG. Note he has speech changes  (dysarthia and thick tongue) CT head 3-19 with no new changes.  Upper GI on 01/04/12 shows Moderate tertiary contractions in the  mid and distal esophagus, swallowing mechanism normal. Bedside  swallow revealed concerning signs of aspiration, objective test  needed.      Assessment / Plan / Recommendation Clinical Impression  Dysphagia Diagnosis: Mild oral phase dysphagia;Mild pharyngeal  phase dysphagia;Mild cervical esophageal phase  dysphagia;Suspected primary esophageal dysphagia Clinical  impression: Pt demonstrates a mild oral, pharyngeal and  cervical esohageal phase dysphagia, which are likely all part of  pts baseline function. Pt has a baseline left lingual, labial  weakness resulting in mild anterior spillage on the left and oral  residuals. Pt is able to improve oral control by consuming  liquids with a straw place in the right side of the mouth. There  is a mild sensory motor pharyngeal deficits associated with  decreased base of tongue retraction and epiglottic deflection.  The swallow is delayed to the level of the pyriform sinuses with  thin liquids. Mild, unsensed residual remains post swallow with  liquids and solids likely due to weakness and limited passage of  bolus through UES. There was no penetration or aspiration  observed during this study.   Also of concern was the appearance of poor esophageal transit of  soldis and liquids. Stasis remains throughout the esophageal  column.  Pt is recommended to consume thin liquids, will defer to  surgery for diet textuere given GI pathology. Pt is physically  able to masticate regular solids but may need softer texture for  digestion. Offered pt and family compensatory strategies to clear  residuals, and esophageal precautions written and verbal. Pt and  wife verbalize and demonstrate understanding. As pt's speech is  much improved today, no further SLP f/u needed at this time.     Treatment Recommendation  No treatment recommended at this time    Diet Recommendation Thin liquid   Liquid Administration via: Straw Medication Administration: Whole meds with liquid Supervision: Patient able to self feed;Full supervision/cueing  for compensatory strategies Compensations: Slow rate;Small sips/bites;Check for anterior  loss;Multiple dry swallows after each bite/sip;Follow solids with  liquid Postural Changes and/or Swallow Maneuvers: Seated upright 90  degrees;Upright 30-60 min after meal    Other  Recommendations Oral Care Recommendations: Oral  care BID   Follow Up Recommendations  None    Frequency and Duration        Pertinent Vitals/Pain NA    SLP Swallow Goals     General HPI: 79 yo WM with a pmh of stroke/caf on pradaxa who  presented to [facility] ED 3-13 with 2 days of abd pain and distension.  Admitted to hospital and underwent EGD 3-17 and multiple bxs were  obtained the gastric and the colonic biopsies show malignancy,  probable adenocarcinoma. He developed increased wob and clinical  findings consistent with pulmonary edema(crackles) and was  transferred to ICU from SDU. He did not tolerate NIMVS  (claustrophobia) but responded well to lasix and NTG. Note he has  speech changes (dysarthia and thick tongue) CT head 3-19 with no  new changes. Upper GI on 01/04/12 shows Moderate tertiary  contractions in the  mid and distal esophagus, swallowing  mechanism normal. Bedside swallow revealed concerning signs of  aspiration, objective test needed.  Type of Study: Modified Barium Swallowing Study Reason for Referral: Objectively evaluate swallowing function Previous Swallow Assessment: noe Diet Prior to this Study: NPO Temperature Spikes Noted: No Respiratory Status: Room air History of Recent Intubation: No Behavior/Cognition: Cooperative;Pleasant mood;Lethargic Oral Cavity - Dentition: Adequate natural dentition Oral Motor / Sensory Function: Impaired - see Bedside swallow  eval Self-Feeding Abilities: Able to feed self Patient Positioning: Upright in chair Baseline Vocal Quality: Clear Volitional Cough: Strong Volitional Swallow: Able to elicit Anatomy: Other (Comment) (Appearance of osteophytic changes at  C5/6/7) Pharyngeal Secretions: Not observed secondary MBS    Reason for Referral Objectively evaluate swallowing function   Oral Phase Oral Preparation/Oral Phase Oral Phase: Impaired Oral - Thin Oral - Thin Cup: Left anterior bolus loss;Weak lingual  manipulation;Left pocketing in lateral sulci;Pocketing in  anterior sulcus;Lingual/palatal residue Oral  - Thin Straw: Weak lingual manipulation;Lingual/palatal  residue Oral - Solids Oral - Puree: Weak lingual manipulation;Lingual/palatal residue Oral - Regular: Weak lingual manipulation;Lingual/palatal residue Oral - Pill: Lingual/palatal residue   Pharyngeal Phase Pharyngeal Phase Pharyngeal Phase: Impaired Pharyngeal - Thin Pharyngeal - Thin Cup: Delayed swallow initiation;Premature  spillage to pyriform sinuses;Reduced tongue base  retraction;Reduced epiglottic inversion;Pharyngeal residue -  valleculae;Pharyngeal residue - pyriform sinuses;Pharyngeal  residue - cp segment Pharyngeal - Thin Straw: Delayed swallow initiation;Premature  spillage to pyriform sinuses;Reduced tongue base  retraction;Reduced epiglottic inversion;Pharyngeal residue -  valleculae;Pharyngeal residue - pyriform sinuses;Pharyngeal  residue - cp segment Pharyngeal - Solids Pharyngeal - Puree: Delayed swallow initiation;Reduced tongue  base retraction;Reduced epiglottic inversion;Pharyngeal residue -  valleculae;Pharyngeal residue - pyriform sinuses Pharyngeal - Regular: Delayed swallow initiation;Reduced tongue  base retraction;Reduced epiglottic inversion;Pharyngeal residue -  valleculae;Pharyngeal residue - pyriform sinuses  Cervical Esophageal Phase    GO    Cervical Esophageal Phase Cervical Esophageal Phase: Impaired Cervical Esophageal Phase - Comment Cervical Esophageal Comment: Due to apperance of impingement form  cervical spine at C5 to C7 there was reduced opening othe UES  with pild cervical esophageal residuals and 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 There was also mild residual in pyriform sinuses  due to decreased transit of bolus. No radiologist present to  confirm.          DeBlois, Riley Nearing 06/07/2012, 10:31 AM      Anti-infectives: Anti-infectives   Start     Dose/Rate Route Frequency Ordered Stop   05/30/12 2200  ciprofloxacin (CIPRO) IVPB 400 mg  Status:  Discontinued     400 mg 200 mL/hr over 60 Minutes  Intravenous Every 12 hours 05/30/12 2123 06/04/12 0746   05/30/12 2200  metroNIDAZOLE (FLAGYL) IVPB 500 mg  Status:  Discontinued     500 mg 100 mL/hr over 60 Minutes Intravenous Every 8 hours 05/30/12 2123 06/04/12 0746       Assessment/Plan  1. adenoca of stomach, likely primary, with mets to colon and probably carcinomatosis, but can't definitely tell. 2. Possible PBO from colon based off of c-scope and clinical picture 3. H/o CVA, possible new CVA 4. A fib  Plan: 1. Continue on clear liquids due to increase in abdominal distention.  Still soft.  If distention persists, will obtain a plain abdominal film to assess bowels.  Slight increase in discomfort today, will follow.   LOS: 9 days    Joella Saefong E 06/08/2012, 9:32 AM Pager: [phone]

## 2012-06-08 NOTE — Progress Notes (Signed)
Patient Name: [name] [name] Date of Encounter: 06/08/2012     Principal Problem:   Acute respiratory distress Active Problems:   Gastroparesis   Atrial fibrillation, rapid   GI bleed   Syncope   Acute encephalopathy   Dysarthria, acute   Paroxysmal a-fib/flutter   Hypokalemia   Weakness generalized   Pain, abdominal, generalized    SUBJECTIVE  Pretty good spirits overall despite condition.  Issues being discussed re: long term care.  CURRENT MEDS . feeding supplement  1 Container Oral TID BM  . metoprolol  5 mg Intravenous Q6H  . pantoprazole sodium  40 mg Oral BID AC  . sodium chloride  3 mL Intravenous Q12H    OBJECTIVE  Filed Vitals:   06/08/12 0800 06/08/12 0905 06/08/12 0915 06/08/12 0918  BP: 105/75 96/66 79/57  101/73  Pulse: 73 84    Temp: 98.2 F (36.8 C)     TempSrc: Oral     Resp: 23 22    Height:      Weight:      SpO2: 100% 99%      Intake/Output Summary (Last 24 hours) at 06/08/12 1011 Last data filed at 06/08/12 1004  Gross per 24 hour  Intake 1393.98 ml  Output    575 ml  Net 818.98 ml   Filed Weights   05/30/12 0840 05/30/12 2015 06/06/12 0500  Weight: 185 lb (83.915 kg) 187 lb 2.7 oz (84.9 kg) 188 lb 0.8 oz (85.3 kg)    PHYSICAL EXAM  General: Pleasant, NAD. Neuro: Alert and oriented X 3. Moves all extremities spontaneously. Psych: Normal affect. HEENT:  Normal  Heart: RRR no s3, s4, or murmurs. Abdomen: Soft, non-tender, non-distended, BS + x 4.  Extremities: No clubbing, cyanosis or edema. DP/PT/Radials 2+ and equal bilaterally.  Accessory Clinical Findings  CBC  Recent Labs  06/07/12 0405 06/08/12 0535  WBC 8.9 7.9  HGB 11.2* 11.7*  HCT 32.3* 33.5*  MCV 94.4 95.2  PLT 272 301   Basic Metabolic Panel  Recent Labs  06/06/12 0517 06/07/12 0405  06/07/12 1931 06/08/12 0535  NA 141 140  < > 137 138  K 3.0* 2.7*  < > 3.4* 3.5  CL 104 100  < > 101 102  CO2 26 29  < > 29 26  GLUCOSE 114* 114*  < > 145*  117*  BUN 16 16  < > 20 22  CREATININE 0.80 0.89  < > 1.03 1.00  CALCIUM 8.9 9.0  < > 8.4 8.6  MG 1.7 1.9  --   --   --   PHOS 2.6 2.9  --   --   --   < > = values in this interval not displayed. Liver Function Tests No results found for this basename: AST, ALT, ALKPHOS, BILITOT, PROT, ALBUMIN,  in the last 72 hours No results found for this basename: LIPASE, AMYLASE,  in the last 72 hours Cardiac Enzymes  Recent Labs  06/05/12 1140 06/05/12 1709  TROPONINI 0.33* <0.30   BNP No components found with this basename: POCBNP,  D-Dimer No results found for this basename: DDIMER,  in the last 72 hours Hemoglobin A1C No results found for this basename: HGBA1C,  in the last 72 hours Fasting Lipid Panel No results found for this basename: CHOL, HDL, LDLCALC, TRIG, CHOLHDL, LDLDIRECT,  in the last 72 hours Thyroid Function Tests No results found for this basename: TSH, T4TOTAL, FREET3, T3FREE, THYROIDAB,  in the last 72 hours  TELE  NSR  ECG  NA  Radiology/Studies  Dg Abd 1 View  06/01/2012  *RADIOLOGY REPORT*  Clinical Data: Obstruction  ABDOMEN - 1 VIEW  Comparison: 05/30/2012  Findings: Distended small bowel loops are scattered across the abdomen.  Minimal colonic gas.  NG tube coiled in the fundus of the stomach.  Phleboliths project over the pelvis.  No obvious free intraperitoneal gas. There are small gas bubbles within the region of the wall of a bowel loop along the left lower quadrant.  The bubbles continue into the lumen of the bowel superiorly therefore it is not felt to represent pneumatosis.  IMPRESSION: Stable partial small bowel obstruction pattern.   Original Report Authenticated By: Jolaine Click, M.D.    Ct Head Wo Contrast  06/04/2012  *RADIOLOGY REPORT*  Clinical Data: New onset confusion and visual hallucinations. Prior history of stroke.  CT HEAD WITHOUT CONTRAST  Technique:  Contiguous axial images were obtained from the base of the skull through the vertex  without contrast.  Comparison: Unenhanced cranial CT 04/17/2012, 03/12/2011.  MRI brain 03/12/2011.  Findings: Moderate cortical and deep atrophy, unchanged.  Moderate to severe changes of small vessel disease of the white matter,, including the brainstem, unchanged.  Old lacunar strokes in the deep white matter of the right frontal lobe and in the right inferior cerebellar hemisphere, unchanged.  No mass lesion.  No midline shift.  No acute hemorrhage or hematoma.  No extra-axial fluid collections.  No evidence of acute infarction.  No significant interval change.  Near complete opacification of the left maxillary sinus with thickening of the sinus walls.  Opacification of a middle right ethmoid air cell.  Remaining visualized paranasal sinuses, bilateral mastoid air cells, and both middle ear cavities well- aerated.  Bilateral carotid siphon atherosclerosis.  Calcified pannus posterior to the dens again noted.  IMPRESSION:  1.  No acute intracranial abnormality. 2.  Stable moderate generalized atrophy, moderate to severe chronic microvascular ischemic changes of the white matter, and old lacunar strokes in the right frontal lobe and right inferior cerebellar hemisphere. 3.  Chronic left maxillary sinusitis and minimal chronic right ethmoid sinusitis.   Original Report Authenticated By: Hulan Saas, M.D.    Ct Abdomen Pelvis W Contrast  05/30/2012  *RADIOLOGY REPORT*  Clinical Data: Near-syncope.  Abdominal pain with bloating and diarrhea.  CT ABDOMEN AND PELVIS WITH CONTRAST  Technique:  Multidetector CT imaging of the abdomen and pelvis was performed following the standard protocol during bolus administration of intravenous contrast.  Contrast: 80mL OMNIPAQUE IOHEXOL 300 MG/ML  SOLN  Comparison: None.  Findings: Lung bases show dependent atelectasis bilaterally.  Heart size normal.  No pericardial effusion.  There may be trace left pleural fluid.  Distal esophagus is dilated and fluid-filled.  Intrahepatic  biliary duct dilatation.  Extrahepatic bile duct is somewhat difficult to follow but is likely within normal limits. Intermediate attenuation material layers in the gallbladder.  Right kidney is unremarkable.  Low attenuation lesions in the left kidney measure up to 1.5 cm and are likely cysts.  Spleen, pancreas, stomach and duodenum are unremarkable.  There is rather diffuse distention of fluid-filled small bowel, without a discrete transition point.  The cecum and proximal ascending colon are fluid filled and dilated as well.  However, there is a fairly long segment of under distention of the colon with noticeable wall thickening involving the distal ascending, transverse and proximal descending portions.  The distal descending and rectosigmoid colon are minimally dilated and contains  fluid.  Moderate ascites.  Mesenteric edema.  No definite pathologically enlarged lymph nodes.  Atherosclerotic calcification of the arterial vasculature.  Prostate is enlarged.  No worrisome lytic or sclerotic lesions.  IMPRESSION:  1.  Long segment of nondistended and thickened colon, involving the ascending, transverse and descending portions, with diffuse fluid filled and dilated small bowel.  Ischemic and infectious/inflammatory etiologies involving the colon are considered, with resultant mechanical small bowel obstruction. Critical Value/emergent results were called by telephone at the time of interpretation on 05/30/2012 at 1225 hours to Dr. Radford Pax, who verbally acknowledged these results. 2.  Tiny left pleural effusion and moderate ascites. 3.  Intrahepatic biliary duct dilatation, of uncertain etiology. 4.  Question gallbladder sludge. 5.  Prostate enlargement.   Original Report Authenticated By: Leanna Battles, M.D.    Dg Chest Port 1 View  06/06/2012  *RADIOLOGY REPORT*  Clinical Data: Shortness of breath.  Congestion.  PORTABLE CHEST - 1 VIEW  Comparison: 06/05/2012.  Findings: Tortuous aorta particularly involving  the ascending aspect.  Cardiomegaly.  Pulmonary vascular congestion most notable centrally  Poor inspiration.  Crowding markings lung bases versus atelectasis. Subtle infiltrate would be difficult to exclude in this setting. Appearance without significant change.  Right lung apex not entirely included present exam.  No gross pneumothorax.  IMPRESSION: Pulmonary vascular congestion most notable centrally.  Poor inspiration with basilar atelectasis.  Tortuous aorta.  Cardiomegaly.   Original Report Authenticated By: Lacy Duverney, M.D.    Dg Chest Port 1 View  06/05/2012  *RADIOLOGY REPORT*  Clinical Data: Shortness of breath.  PORTABLE CHEST - 1 VIEW  Comparison: 05/31/2012  Findings: Shallow inspiration.  Bilateral pleural effusions with basilar atelectasis or infiltration bilaterally.  These changes are progressing since previous study.  Mild cardiac enlargement with borderline normal pulmonary vascularity.  No pneumothorax. Tortuous aorta.  IMPRESSION: Shallow inspiration with increasing bilateral pleural effusions and bilateral basilar atelectasis or infiltration.   Original Report Authenticated By: Burman Nieves, M.D.    Dg Chest Port 1 View  05/31/2012  *RADIOLOGY REPORT*  Clinical Data: Ascites.  Pleural effusion.  PORTABLE CHEST - 1 VIEW  Comparison: 05/10/2012.  Findings: Nasogastric tube gastric fundus level.  Bibasilar atelectasis.  Poor inspiration.  Limited evaluation lung bases.  Central pulmonary vascular prominence.  Tortuous aorta.  No gross pneumothorax.  Heart size top normal.  IMPRESSION: Poor inspiration with bibasilar atelectatic changes.  Limited evaluation lung bases.  Calcified tortuous aorta.  Central pulmonary vascular prominence.   Original Report Authenticated By: Lacy Duverney, M.D.    Dg Chest Port 1 View  05/10/2012  *RADIOLOGY REPORT*  Clinical Data: Low back pain  PORTABLE CHEST - 1 VIEW  Comparison: 04/17/2012  Findings: Cardiomediastinal silhouette is stable.  No acute  infiltrate or pleural effusion.  No pulmonary edema.  Bony thorax is stable.  IMPRESSION: No active disease.  No significant change.   Original Report Authenticated By: Natasha Mead, M.D.    Dg Swallowing Func-speech Pathology  06/07/2012  Riley Nearing Deblois, CCC-SLP     06/07/2012 10:39 AM Objective Swallowing Evaluation: Modified Barium Swallowing Study   Patient Details  Name: AIDDEN MARKOVIC MRN: [id number] Date of Birth: Feb 16, 1931  Today's Date: 06/07/2012 Time: 0940-1005 SLP Time Calculation (min): 25 min  Past Medical History:  Past Medical History  Diagnosis Date  . Ramsay Hunt auricular syndrome   . HTN (hypertension)   . Osteoarthritis   . Dysrhythmia 04/17/12  . Anxiety   . Stroke   . H/O Bell's  palsy at age 52  . Atrial flutter   . Atrial fibrillation    Past Surgical History:  Past Surgical History  Procedure Laterality Date  . Hernia repair    . Vasectomy    . Prostate biopsies      per Vonita Moss  . Cataract extraction      SE eye  . Colonoscopy N/A 06/03/2012    Procedure: COLONOSCOPY;  Surgeon: Florencia Reasons, MD;   Location: [facility] ENDOSCOPY;  Service: Endoscopy;  Laterality: N/A;  . Esophagogastroduodenoscopy N/A 06/03/2012    Procedure: ESOPHAGOGASTRODUODENOSCOPY (EGD);  Surgeon: Florencia Reasons, MD;  Location: [facility] ENDOSCOPY;  Service: Endoscopy;   Laterality: N/A;   HPI:  79 yo WM with a pmh of stroke/caf on pradaxa who presented to  [facility] ED 3-13 with 2 days of abd pain and distension. Admitted to  hospital and underwent EGD 3-17 and multiple bxs were obtained  the gastric and the colonic biopsies show malignancy, probable  adenocarcinoma. He developed increased wob and clinical findings  consistent with pulmonary edema(crackles) and was transferred to  ICU from SDU. He did not tolerate NIMVS (claustrophobia) but  responded well to lasix and NTG. Note he has speech changes  (dysarthia and thick tongue) CT head 3-19 with no new changes.  Upper GI on 01/04/12 shows Moderate tertiary contractions in  the  mid and distal esophagus, swallowing mechanism normal. Bedside  swallow revealed concerning signs of aspiration, objective test  needed.      Assessment / Plan / Recommendation Clinical Impression  Dysphagia Diagnosis: Mild oral phase dysphagia;Mild pharyngeal  phase dysphagia;Mild cervical esophageal phase  dysphagia;Suspected primary esophageal dysphagia Clinical impression: Pt demonstrates a mild oral, pharyngeal and  cervical esohageal phase dysphagia, which are likely all part of  pts baseline function. Pt has a baseline left lingual, labial  weakness resulting in mild anterior spillage on the left and oral  residuals. Pt is able to improve oral control by consuming  liquids with a straw place in the right side of the mouth. There  is a mild sensory motor pharyngeal deficits associated with  decreased base of tongue retraction and epiglottic deflection.  The swallow is delayed to the level of the pyriform sinuses with  thin liquids. Mild, unsensed residual remains post swallow with  liquids and solids likely due to weakness and limited passage of  bolus through UES. There was no penetration or aspiration  observed during this study.   Also of concern was the appearance of poor esophageal transit of  soldis and liquids. Stasis remains throughout the esophageal  column.  Pt is recommended to consume thin liquids, will defer to  surgery for diet textuere given GI pathology. Pt is physically  able to masticate regular solids but may need softer texture for  digestion. Offered pt and family compensatory strategies to clear  residuals, and esophageal precautions written and verbal. Pt and  wife verbalize and demonstrate understanding. As pt's speech is  much improved today, no further SLP f/u needed at this time.     Treatment Recommendation  No treatment recommended at this time    Diet Recommendation Thin liquid   Liquid Administration via: Straw Medication Administration: Whole meds with liquid Supervision:  Patient able to self feed;Full supervision/cueing  for compensatory strategies Compensations: Slow rate;Small sips/bites;Check for anterior  loss;Multiple dry swallows after each bite/sip;Follow solids with  liquid Postural Changes and/or Swallow Maneuvers: Seated upright 90  degrees;Upright 30-60 min after meal    Other  Recommendations Oral Care Recommendations: Oral care BID   Follow Up Recommendations  None    Frequency and Duration        Pertinent Vitals/Pain NA    SLP Swallow Goals     General HPI: 79 yo WM with a pmh of stroke/caf on pradaxa who  presented to [facility] ED 3-13 with 2 days of abd pain and distension.  Admitted to hospital and underwent EGD 3-17 and multiple bxs were  obtained the gastric and the colonic biopsies show malignancy,  probable adenocarcinoma. He developed increased wob and clinical  findings consistent with pulmonary edema(crackles) and was  transferred to ICU from SDU. He did not tolerate NIMVS  (claustrophobia) but responded well to lasix and NTG. Note he has  speech changes (dysarthia and thick tongue) CT head 3-19 with no  new changes. Upper GI on 01/04/12 shows Moderate tertiary  contractions in the mid and distal esophagus, swallowing  mechanism normal. Bedside swallow revealed concerning signs of  aspiration, objective test needed.  Type of Study: Modified Barium Swallowing Study Reason for Referral: Objectively evaluate swallowing function Previous Swallow Assessment: noe Diet Prior to this Study: NPO Temperature Spikes Noted: No Respiratory Status: Room air History of Recent Intubation: No Behavior/Cognition: Cooperative;Pleasant mood;Lethargic Oral Cavity - Dentition: Adequate natural dentition Oral Motor / Sensory Function: Impaired - see Bedside swallow  eval Self-Feeding Abilities: Able to feed self Patient Positioning: Upright in chair Baseline Vocal Quality: Clear Volitional Cough: Strong Volitional Swallow: Able to elicit Anatomy: Other (Comment) (Appearance of  osteophytic changes at  C5/6/7) Pharyngeal Secretions: Not observed secondary MBS    Reason for Referral Objectively evaluate swallowing function   Oral Phase Oral Preparation/Oral Phase Oral Phase: Impaired Oral - Thin Oral - Thin Cup: Left anterior bolus loss;Weak lingual  manipulation;Left pocketing in lateral sulci;Pocketing in  anterior sulcus;Lingual/palatal residue Oral - Thin Straw: Weak lingual manipulation;Lingual/palatal  residue Oral - Solids Oral - Puree: Weak lingual manipulation;Lingual/palatal residue Oral - Regular: Weak lingual manipulation;Lingual/palatal residue Oral - Pill: Lingual/palatal residue   Pharyngeal Phase Pharyngeal Phase Pharyngeal Phase: Impaired Pharyngeal - Thin Pharyngeal - Thin Cup: Delayed swallow initiation;Premature  spillage to pyriform sinuses;Reduced tongue base  retraction;Reduced epiglottic inversion;Pharyngeal residue -  valleculae;Pharyngeal residue - pyriform sinuses;Pharyngeal  residue - cp segment Pharyngeal - Thin Straw: Delayed swallow initiation;Premature  spillage to pyriform sinuses;Reduced tongue base  retraction;Reduced epiglottic inversion;Pharyngeal residue -  valleculae;Pharyngeal residue - pyriform sinuses;Pharyngeal  residue - cp segment Pharyngeal - Solids Pharyngeal - Puree: Delayed swallow initiation;Reduced tongue  base retraction;Reduced epiglottic inversion;Pharyngeal residue -  valleculae;Pharyngeal residue - pyriform sinuses Pharyngeal - Regular: Delayed swallow initiation;Reduced tongue  base retraction;Reduced epiglottic inversion;Pharyngeal residue -  valleculae;Pharyngeal residue - pyriform sinuses  Cervical Esophageal Phase    GO    Cervical Esophageal Phase Cervical Esophageal Phase: Impaired Cervical Esophageal Phase - Comment Cervical Esophageal Comment: Due to apperance of impingement form  cervical spine at C5 to C7 there was reduced opening othe UES  with pild cervical esophageal residuals and 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 There  was also mild residual in pyriform sinuses  due to decreased transit of bolus. No radiologist present to  confirm.          DeBlois, Riley Nearing 06/07/2012, 10:31 AM      ASSESSMENT AND PLAN  1.  Recurrent atrial fib with history of CVA.  Maintaining NSR yesterday on metoprolol 5mg  IV every 6 hours.   2.  Recent volume overload on CXR 3/20 with about even  I0.  Continue as at present.    Signed, Shawnie Pons MD, [facility], FSCAI

## 2012-06-08 NOTE — Progress Notes (Signed)
Subjective: Low BP yesterday, seems to be doing better today. Has severe claustrophobia and may not be abel to tolerate MRI without medications.   Exam: Filed Vitals:   06/08/12 0918  BP: 101/73  Pulse:   Temp:   Resp:    Gen: In bed, NAD MS: Awake, Alert, Oriented to person, month, year, place. Able to spell world backwards, left facial droop (baseline). Mild dysarthria.  ZO:XWRUE, EOMI, fixates and tracks Motor: no Drift  Impression: 79 yo M with worsened dysarthria in the setting of multiple medical issues and delirium. Given that he was off of anticoagulation for his GI bleed, he is at risk for having had a CVA, but I do not feel that it is definite. If he has had a CVA or not, he is at risk with his Afib and I would recommend anticoagulation over the long term if he is not at risk of further GI bleeding.   Recommendations: 1) MRI brain, will revisit once MRI brain has been performed or sooner if needed. Please call if there are acute changes or questions in the interim.   Ritta Slot, MD [facility] [phone]  If 7pm- 7am, please page neurology on call at [phone].

## 2012-06-08 NOTE — Progress Notes (Signed)
Physical Therapy Treatment Patient Details Name: [name] [name] MRN: [id number] DOB: 03-Jun-1930 Today's Date: 06/08/2012 Time: 4098-1191 PT Time Calculation (min): 39 min  PT Assessment / Plan / Recommendation Comments on Treatment Session  Patient able to progress to out of bed activity today.  Tolerates with decreased BP and lightheaedness.  Participated in therapuetic exercise in chair for strength and ROM.  Encouraged to perform on his own throughout the day. Pt. feels cervical restriction due to bed.  Would recommend possibly air overlay versus air adjustable bed.  Should be able to progress tolerance to be able to return home with wife assist.    Follow Up Recommendations  Home health PT;Supervision/Assistance - 24 hour           Equipment Recommendations  Rolling walker with 5" wheels    Recommendations for Other Services OT consult  Frequency Min 3X/week   Plan Discharge plan needs to be updated    Precautions / Restrictions Precautions Precautions: Fall   Pertinent Vitals/Pain BP sitting 96/66, HR 84, SpO2 99 on room air, RR 22, BP after OOB/few steps with walker 79/57; 101/73reclined feet on 2 pillows    Mobility  Bed Mobility Bed Mobility: Supine to Sit Supine to Sit: HOB elevated;4: Min assist;With rails Details for Bed Mobility Assistance: cues for technique and increased time due to low bp and c/o lightheaded Transfers Transfers: Sit to Stand;Stand to Sit;Stand Pivot Transfers Sit to Stand: From elevated surface;4: Min assist;From bed;With upper extremity assist Stand to Sit: 4: Min assist;With armrests;To chair/3-in-1;With upper extremity assist Stand Pivot Transfers: 4: Min assist Details for Transfer Assistance: cues for hand placement and with stepping to chair used walker (see ambulation below) Ambulation/Gait Ambulation/Gait Assistance: 4: Min assist Ambulation Distance (Feet): 3 Feet Assistive device: Rolling walker Ambulation/Gait Assistance  Details: took 3-4 steps with walker and c/o lightheaded and chair pulled under pt to allow to sit with BP 79/57. Gait Pattern: Step-to pattern;Decreased stride length;Shuffle    Exercises General Exercises - Lower Extremity Ankle Circles/Pumps: AROM;Both;20 reps;Seated Quad Sets: Both;10 reps;Seated;Strengthening Gluteal Sets: Both;10 reps;Seated;Strengthening Heel Raises: AROM;Standing;5 reps;Both Other Exercises Other Exercises: Cervical exercises in sitting: head press/chin tuck with towl behind head x 5 reps with 5 sec hold; rotation AROM in available range x 10.     PT Goals Acute Rehab PT Goals Pt will go Supine/Side to Sit: with modified independence PT Goal: Supine/Side to Sit - Progress: Updated due to goal met Pt will go Sit to Stand: with supervision PT Goal: Sit to Stand - Progress: Updated due to goal met Pt will go Stand to Sit: with supervision PT Goal: Stand to Sit - Progress: Updated due to goals met Pt will Stand: with min assist;3 - 5 min;with unilateral upper extremity support PT Goal: Stand - Progress: Progressing toward goal Pt will Ambulate: 16 - 50 feet;with min assist;with least restrictive assistive device PT Goal: Ambulate - Progress: Progressing toward goal  Visit Information  Last PT Received On: 06/08/12 Assistance Needed: +1    Subjective Data  Subjective: Feel weak, good to get out of bed.   Cognition  Cognition Overall Cognitive Status: Appears within functional limits for tasks assessed/performed Arousal/Alertness: Awake/alert Orientation Level: Appears intact for tasks assessed Behavior During Session: [facility] for tasks performed    Balance  Balance Balance Assessed: Yes Static Sitting Balance Static Sitting - Balance Support: Feet supported;Bilateral upper extremity supported Static Sitting - Level of Assistance: 5: Stand by assistance Static Sitting - Comment/# of Minutes: sat  edge of bed about 4-5 minutes while adjusting to  upright Static Standing Balance Static Standing - Balance Support: Bilateral upper extremity supported Static Standing - Level of Assistance: 4: Min assist Static Standing - Comment/# of Minutes: standing at bedside with heel raises to keep pressure up.  End of Session PT - End of Session Equipment Utilized During Treatment: Gait belt Activity Tolerance: Treatment limited secondary to medical complications (Comment) (low BP in upright position) Patient left: in chair;with call bell/phone within reach   GP     [facility] 06/08/2012, 10:01 AM Sheran Lawless, PT [phone] 06/08/2012

## 2012-06-08 NOTE — Progress Notes (Signed)
[facility] Progress Note [place] TEAM 1 - Stepdown/ICU TEAM   [name] [name] ZOX:[id number] DOB: [birth year] DOA: 05/30/2012 PCP: Miguel Aschoff, MD  Brief narrative: 79 year old male with multiple medical problems including chronic atrial fibrillation on pradaxa, hx of stroke, hypertension, and gastroparesis who presented to ED with complaints of abdominal distention with associated coffee-ground emesis. The symptoms started 2 days prior with abdominal pain diffusely with cramping sensation, rated 7-8/10. Subsequently he noticed that his abdomen was getting distended. This was associated with intermittent diarrhea over a month. He had been evaluated by his gastroenterologist (Dr. Madilyn Fireman) who advised lactose-free diet but unfortunately he saw no improvement in the diarrhea. On the morning of admission he reported feeling dizzy and lightheaded and had an episode of diarrhea. He had a syncopal episode.  Patient denied hitting his head and was witnessed by his wife. Per his wife, he was out for only a few seconds. When patient presented to the ED, he was noted to be in rapid A. Fib, HR in 130's, hypotensive BP in 80's, he was placed on IV fluids. He had 2 episodes of coffee-ground emesis in the ED. FOBT was positive in ED. A CT scan of the abdomen was obtained due to the abdominal distention and coffee-ground emesis which showed moderate ascites with diffuse fluid filled and dilated small bowel likely mechanical small bowel obstruction with potentail ischemic/infectious and inflammatory etiologies.   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 Darnelle Catalan has volunteered to be his Hospice MD -Appreciate Palliative assistance  Hematemesis / gastric CA -on thin liquids for now (cleared by SLP)  -resume pradaxa today and watch for evidence of GIB - has thus far tolerated heparin  Acute delirium / TIA vs CVA / acute dysarthria -CT Head negative - consulted Neuro -MRI to be delayed due to hypotension -dysarthria resolved -had apparent admit for CVA Jan 2014 in [facility] and was also admitted to [facility] Jan 2014 for expressive aphasia -with known AF anti-coagulation resumed with Heparin by PCCM (see their notes) - spoke w/ Neuro team - it is not felt that immediate coverage w/ heparin is clearly indicated at this time - I will d/c heparin, and begin Pradaxa (as per home regimen) with help from pharmacy - will need to monitor for evidence of bleeding (reviewed GI note from 3/19)  Diarrhea  -resolved in absence of significant intake   Rapid atrial fibrillation/flutter -Cardizem gtt stopped due to hypotension  -Cardiology following -IV Lopressor alone currently -NSR on monitor at this time  -keep K+ >4.0  Hypokalemia -2/2 high output diarrhea -Replacing - beginning to catch up to total body deficit  Elevated cardiac enzymes -Likely due to rapid heart rate/ demand ischemia - normalized as of 3/19  Hx of HYPERTENSION, BENIGN SYSTEMIC - not an  active issue at this time   Code Status: Full code Family Communication: discussed with pt and wife  Disposition Plan: SDU DVT prophylaxis: Heparin >>  Pradaxa  Consultants: Gastroenterology Cardiology Neurology Oncology  Procedures: EGD (06/03/12)  1. Mucosal edema and slight nonerosive gastric erythema. Findings are nonspecific.  2.? Obstructive sleep apnea based on tolerance of procedure as described above  RECOMMENDATIONS:  1. Await pathology results  2. Proceed to colonoscopic evaluation   Colonoscopy (/06/03/12) 1. Significant mucosal edema and spasm in the mid transverse colon. The etiology is not clear, but I wonder about ischemia. Efforts to advance the scope more proximally were unsuccessful within the constraints of the exam in terms of patient sedation tolerance, see above discussion.  RECOMMENDATIONS:  1. Await pathology results. Supportive care in the meantime.  Antibiotics: Ciprofloxacin 3/13>>3/18 Flagyl 3/13>>3/18  HPI/Subjective: Alert and quite pleasant.  No focal neuro deficits from baseline.  Reported lightheadedness when standing with PT this morning.  No recent diarrhea, but only consuming clear liquids.  Denies hunger.  No cp or sob.    Objective: Blood pressure 101/73, pulse 84, temperature 98.2 F (36.8 C), temperature source Oral, resp. rate 22, height 6\' 1"  (1.854 m), weight 85.3 kg (188 lb 0.8 oz), SpO2 99.00%.  Intake/Output Summary (Last 24 hours) at 06/08/12 1011 Last data filed at 06/08/12 1004  Gross per 24 hour  Intake 1393.98 ml  Output    575 ml  Net 818.98 ml   Exam: General: Awake alert oriented x3, No acute respiratory distress Lungs: Clear to auscultation bilaterally without wheezes or crackles Cardiovascular: Regular rate and normal sinus rhythm without murmur gallop or rub normal S1 and S2, no peripheral edema or JVD Abdomen: Nontender, mildly distended, soft, BS high pitched, NGT to LWS Musculoskeletal: No significant cyanosis, clubbing of bilateral lower extremities Neurological: Awake and oriented x3, moves all extremities x4, cranial nerves II through XII grossly  intact  Data Reviewed: Basic Metabolic Panel:  Recent Labs Lab 06/04/12 0400  06/06/12 0517 06/07/12 0405 06/07/12 1217 06/07/12 1931 06/08/12 0535  NA 139  < > 141 140 138 137 138  K 3.7  < > 3.0* 2.7* 3.1* 3.4* 3.5  CL 105  < > 104 100 97 101 102  CO2 20  < > 26 29 30 29 26   GLUCOSE 107*  < > 114* 114* 122* 145* 117*  BUN 15  < > 16 16 16 20 22   CREATININE 0.87  < > 0.80 0.89 1.02 1.03 1.00  CALCIUM 8.8  < > 8.9 9.0 9.2 8.4 8.6  MG 1.7  --  1.7 1.9  --   --   --   PHOS  --   --  2.6 2.9  --   --   --   < > = values in this interval not displayed. CBC:  Recent Labs Lab 06/04/12 0400 06/05/12 0355 06/06/12 0517 06/07/12 0405 06/08/12 0535  WBC 13.0* 13.1* 11.3* 8.9 7.9  HGB 12.0* 11.3* 11.0* 11.2* 11.7*  HCT 34.0* 31.6* 30.8* 32.3* 33.5*  MCV 95.5 95.8 93.3 94.4 95.2  PLT 235 243 242 272 301   Cardiac Enzymes:  Recent Labs Lab 06/05/12 0650 06/05/12 1140 06/05/12 1709  TROPONINI 0.35* 0.33* <0.30    Recent Results (from the past 240 hour(s))  URINE CULTURE     Status: None   Collection Time    05/30/12  7:42 PM      Result Value Range Status   Specimen Description URINE,  RANDOM   Final   Special Requests NONE   Final   Culture  Setup Time 05/31/2012 03:28   Final   Colony Count NO GROWTH   Final   Culture NO GROWTH   Final   Report Status 06/01/2012 FINAL   Final  CLOSTRIDIUM DIFFICILE BY PCR     Status: None   Collection Time    05/30/12  9:36 PM      Result Value Range Status   C difficile by pcr NEGATIVE  NEGATIVE Final  OVA AND PARASITE EXAMINATION     Status: None   Collection Time    05/30/12  9:36 PM      Result Value Range Status   Specimen Description STOOL   Final   Special Requests Normal   Final   Ova and parasites NO OVA OR PARASITES SEEN   Final   Report Status 06/05/2012 FINAL   Final  URINE CULTURE     Status: None   Collection Time    05/31/12  2:23 AM      Result Value Range Status   Specimen Description URINE,  CATHETERIZED   Final   Special Requests NONE   Final   Culture  Setup Time 05/31/2012 03:39   Final   Colony Count NO GROWTH   Final   Culture NO GROWTH   Final   Report Status 06/01/2012 FINAL   Final  MRSA PCR SCREENING     Status: None   Collection Time    05/31/12  2:46 AM      Result Value Range Status   MRSA by PCR NEGATIVE  NEGATIVE Final   Comment:            The GeneXpert MRSA Assay (FDA     approved for NASAL specimens     only), is one component of a     comprehensive MRSA colonization     surveillance program. It is not     intended to diagnose MRSA     infection nor to guide or     monitor treatment for     MRSA infections.     Studies:  Recent x-ray studies have been reviewed in detail by the Attending Physician  Scheduled Meds:  Scheduled Meds: . feeding supplement  1 Container Oral TID BM  . metoprolol  5 mg Intravenous Q6H  . pantoprazole sodium  40 mg Oral BID AC  . sodium chloride  3 mL Intravenous Q12H   Continuous Infusions: . sodium chloride 50 mL/hr at 06/08/12 0513  . heparin 1,900 Units/hr (06/08/12 0841)    Time spent on care of this patient: 35 minutes  Lonia Blood, MD [facility] Office  [phone] Pager [phone]  On-Call/Text Page:      [email or web address]      password [email or web address]  If 7PM-7AM, please contact night-coverage [email or web address] Password [facility] 06/08/2012, 10:11 AM   LOS: 9 days

## 2012-06-08 NOTE — Progress Notes (Signed)
Pt has order for metoprolol IV and BP 97/66; Dr. Claiborne Billings V.O. To hold this dose.

## 2012-06-08 NOTE — Progress Notes (Addendum)
1550Sharon Seller, MD text paged regarding pt only having urine output in the last 24 hours.  Will continue to monitor.  1700: noticed during bed bath that pt had 1+ pitting edema in his lower extremities. No SOB, O2 WNL.  Will continue to monitor.  Salomon Mast, RN

## 2012-06-08 NOTE — Plan of Care (Signed)
Problem: Phase II Progression Outcomes Goal: Progress activity as tolerated unless otherwise ordered Outcome: Progressing Up with PT today

## 2012-06-08 NOTE — Progress Notes (Signed)
Progress Note from the Palliative Medicine Team at [facility]  Subjective: patient is awake and alert, reports he "feels alittle stronger but belly is more swollen"   --spoke with wife at bedside  Objective: Allergies  Allergen Reactions  . Lactose Intolerance (Gi)   . Penicillins Rash    Rash appeared on palms of hands and feet.   Scheduled Meds: . feeding supplement  1 Container Oral TID BM  . metoprolol  5 mg Intravenous Q6H  . pantoprazole sodium  40 mg Oral BID AC  . sodium chloride  3 mL Intravenous Q12H   Continuous Infusions: . sodium chloride 50 mL/hr at 06/08/12 0513  . heparin 1,700 Units/hr (06/07/12 1827)   PRN Meds:.acetaminophen, morphine CONCENTRATE, ondansetron (ZOFRAN) IV  BP 110/70  Pulse 73  Temp(Src) 97.9 F (36.6 C) (Oral)  Resp 22  Ht 6\' 1"  (1.854 m)  Wt 85.3 kg (188 lb 0.8 oz)  BMI 24.82 kg/m2  SpO2 96%   PPS:30 %  Pain Score:denies presently  Pain Location abdomen   Intake/Output Summary (Last 24 hours) at 06/08/12 0831 Last data filed at 06/08/12 0700  Gross per 24 hour  Intake 1440.35 ml  Output   1150 ml  Net 290.35 ml       Physical Exam:  General: chronically ill appearing HEENT:  moist buccal membranes, no exudate Chest:   Diminished in bases  CTA CVS: RRR Abdomen: firm, distended, non tender +BS--"passing some gas" Ext: without edema Neuro:alert and oriented X3  Labs: CBC    Component Value Date/Time   WBC 7.9 06/08/2012 0535   RBC 3.52* 06/08/2012 0535   HGB 11.7* 06/08/2012 0535   HCT 33.5* 06/08/2012 0535   PLT 301 06/08/2012 0535   MCV 95.2 06/08/2012 0535   MCH 33.2 06/08/2012 0535   MCHC 34.9 06/08/2012 0535   RDW 13.3 06/08/2012 0535   LYMPHSABS 0.7 05/30/2012 0926   MONOABS 0.8 05/30/2012 0926   EOSABS 0.0 05/30/2012 0926   BASOSABS 0.0 05/30/2012 0926    BMET    Component Value Date/Time   NA 138 06/08/2012 0535   K 3.5 06/08/2012 0535   CL 102 06/08/2012 0535   CO2 26 06/08/2012 0535   GLUCOSE 117* 06/08/2012  0535   BUN 22 06/08/2012 0535   CREATININE 1.00 06/08/2012 0535   CALCIUM 8.6 06/08/2012 0535   GFRNONAA 69* 06/08/2012 0535   GFRAA 80* 06/08/2012 0535    CMP     Component Value Date/Time   NA 138 06/08/2012 0535   K 3.5 06/08/2012 0535   CL 102 06/08/2012 0535   CO2 26 06/08/2012 0535   GLUCOSE 117* 06/08/2012 0535   BUN 22 06/08/2012 0535   CREATININE 1.00 06/08/2012 0535   CALCIUM 8.6 06/08/2012 0535   PROT 6.2 03/13/2011 0500   ALBUMIN 3.2* 03/13/2011 0500   AST 17 03/13/2011 0500   ALT 11 03/13/2011 0500   ALKPHOS 89 03/13/2011 0500   BILITOT 1.1 03/13/2011 0500   GFRNONAA 69* 06/08/2012 0535   GFRAA 80* 06/08/2012 0535      Assessment and Plan: 1. Code Status: Full code 2. Symptom Control:Pain-Roxanol 5 mg prn                                      Weakness:  PT and self AROM exercises  3. Psycho/Social: Continued emotional support for patient and wife at bedside,  This  is a very difficult time for both as they continue to process the recent information and sort out their options 4. Spiritual refuses chaplain services at this time 5. Disposition: Dependant on outcomes     Time In Time Out Total Time Spent with Patient Total Overall Time  1325 1400 35 min 35 min    Greater than 50%  of this time was spent counseling and coordinating care related to the above assessment and plan.  Lorinda Creed NP  Palliative Medicine Team Team Phone # [phone] Pager [phone]  Discussed above with Foye Spurling PA 1

## 2012-06-08 NOTE — Progress Notes (Addendum)
ANTICOAGULATION CONSULT NOTE Pharmacy Consult for heparin/Xarelto Indication: afib/CVA  Allergies  Allergen Reactions  . Lactose Intolerance (Gi)   . Penicillins Rash    Rash appeared on palms of hands and feet.    Labs:  Recent Labs  06/05/12 1140 06/05/12 1709  06/06/12 0517  06/07/12 0405 06/07/12 1210 06/07/12 1217 06/07/12 1931 06/08/12 0535  HGB  --   --   < > 11.0*  --  11.2*  --   --   --  11.7*  HCT  --   --   --  30.8*  --  32.3*  --   --   --  33.5*  PLT  --   --   --  242  --  272  --   --   --  301  HEPARINUNFRC  --   --   < >  --   < > 0.18* 0.48  --   --  0.16*  CREATININE  --   --   < > 0.80  --  0.89  --  1.02 1.03 1.00  TROPONINI 0.33* <0.30  --   --   --   --   --   --   --   --   < > = values in this interval not displayed.  Estimated Creatinine Clearance: 67.7 ml/min (by C-G formula based on Cr of 1).  Assessment: 79 yo male with afib who presented with coffee ground emesis in the ED found to be related to gastric cancer. Also concern for recent CVA. Heparin was started and will be kept at low rage due to risk for further bleeding.  Heparin level subtherapeutic at 0.16 on 1700 units/hr. Platelets stable, no bleeding per RN.  Goal of Therapy:  Heparin level= 0.3-0.5 Monitor platelets by anticoagulation protocol: Yes   Plan:  Increase heparin to 1900 units/hr Follow up heparin level in 8 hours Daily CBC Monitor for s/sx of bleeding    **Addendeum** - Heparin drip stopped to start Xarelto. Since pt had GIB with Pradaxa he is being changed to Xarelto due it being less frequently associated with GIB. Renal function fair, no dose adjustment necessary.  - Xarelto 20mg  PO now, then daily with supper - Monitor renal function  Thank you. Vania Rea. Darin Engels.D. Clinical Pharmacist Pager [phone] Phone [phone] 06/08/2012 8:16 AM

## 2012-06-09 DIAGNOSIS — C169 Malignant neoplasm of stomach, unspecified: Secondary | ICD-10-CM

## 2012-06-09 DIAGNOSIS — K56609 Unspecified intestinal obstruction, unspecified as to partial versus complete obstruction: Secondary | ICD-10-CM

## 2012-06-09 DIAGNOSIS — M436 Torticollis: Secondary | ICD-10-CM

## 2012-06-09 LAB — BASIC METABOLIC PANEL
Calcium: 8.6 mg/dL (ref 8.4–10.5)
Chloride: 103 mEq/L (ref 96–112)
Creatinine, Ser: 1.07 mg/dL (ref 0.50–1.35)
GFR calc Af Amer: 74 mL/min — ABNORMAL LOW (ref >90)
Sodium: 141 mEq/L (ref 135–145)

## 2012-06-09 LAB — HEPATIC FUNCTION PANEL
ALT: 18 U/L (ref 0–53)
Albumin: 2.2 g/dL — ABNORMAL LOW (ref 3.5–5.2)
Indirect Bilirubin: 0.3 mg/dL (ref 0.3–0.9)
Total Protein: 5 g/dL — ABNORMAL LOW (ref 6.0–8.3)

## 2012-06-09 MED ORDER — MORPHINE SULFATE (CONCENTRATE) 10 MG /0.5 ML PO SOLN
5.0000 mg | ORAL | Status: DC | PRN
  Administered 2012-06-10 – 2012-06-17 (×2): 5 mg via ORAL
  Filled 2012-06-09 (×2): qty 0.5

## 2012-06-09 NOTE — Progress Notes (Signed)
[facility] Progress Note [place] TEAM 1 - Stepdown/ICU TEAM   [name] [name] ZDG:[id number] DOB: 1930/09/17 DOA: 05/30/2012 PCP: Miguel Aschoff, MD  Brief narrative: 79 year old male with multiple medical problems including chronic atrial fibrillation on pradaxa, hx of stroke, hypertension, and gastroparesis who presented to ED with complaints of abdominal distention with associated coffee-ground emesis. The symptoms started 2 days prior with abdominal pain diffusely with cramping sensation, rated 7-8/10. Subsequently he noticed that his abdomen was getting distended. This was associated with intermittent diarrhea over a month. He had been evaluated by his gastroenterologist (Dr. Madilyn Fireman) who advised lactose-free diet but unfortunately he saw no improvement in the diarrhea. On the morning of admission he reported feeling dizzy and lightheaded and had an episode of diarrhea. He had a syncopal episode.  Patient denied hitting his head and was witnessed by his wife. Per his wife, he was out for only a few seconds. When patient presented to the ED, he was noted to be in rapid A. Fib, HR in 130's, hypotensive BP in 80's, he was placed on IV fluids. He had 2 episodes of coffee-ground emesis in the ED. FOBT was positive in ED. A CT scan of the abdomen was obtained due to the abdominal distention and coffee-ground emesis which showed moderate ascites with diffuse fluid filled and dilated small bowel likely mechanical small bowel obstruction with potentail ischemic/infectious and inflammatory etiologies.   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 Darnelle Catalan has volunteered to be his Hospice MD -Appreciate Palliative assistance  Hematemesis / gastric CA -advancing diet slowly  - tolerating thus far -no evidence of bleeding on xarelto (chosen for decreased risk of GIB) at this time   Acute delirium / TIA vs CVA / acute dysarthria -CT Head negative - consulted Neuro -MRI to be delayed due to hypotension -dysarthria resolved -had apparent admit for CVA Jan 2014 in [facility] and was also admitted to [facility] Jan 2014 for expressive aphasia  Diarrhea  -resolved in absence of significant intake   Rapid atrial fibrillation/flutter -Cardizem gtt stopped due to hypotension  -Cardiology following -IV Lopressor alone currently -NSR on monitor at this time  -keep K+ >4.0  Hypokalemia -2/2 high output diarrhea -Replacing - beginning to catch up to total body deficit  Elevated cardiac enzymes -Likely due to rapid heart rate/ demand ischemia - normalized as of 3/19  Hx of HYPERTENSION, BENIGN SYSTEMIC - not an active issue at this time   Code Status: Full code Family Communication: discussed with pt Disposition Plan: SDU DVT prophylaxis: Xarelto  Consultants: Gastroenterology Cardiology Neurology Oncology  Procedures: EGD (06/03/12)  1. Mucosal edema and slight nonerosive gastric erythema. Findings are nonspecific.  2.? Obstructive sleep apnea based on tolerance of procedure as described above  RECOMMENDATIONS:  1. Await pathology results  2. Proceed to colonoscopic evaluation   Colonoscopy (/06/03/12) 1. Significant mucosal edema and spasm in the mid transverse colon. The etiology is not clear, but I wonder about ischemia. Efforts to advance  the scope more proximally were unsuccessful within the  constraints of the exam in terms of patient sedation tolerance, see above discussion.  RECOMMENDATIONS:  1. Await pathology results. Supportive care in the meantime.  Antibiotics: Ciprofloxacin 3/13>>3/18 Flagyl 3/13>>3/18  HPI/Subjective: Alert and quite pleasant.  No focal neuro deficits from baseline.  No recent diarrhea.  Denies hunger.  No cp or sob.    Objective: Blood pressure 101/68, pulse 82, temperature 97.6 F (36.4 C), temperature source Oral, resp. rate 20, height 6\' 1"  (1.854 m), weight 85 kg (187 lb 6.3 oz), SpO2 93.00%.  Intake/Output Summary (Last 24 hours) at 06/09/12 1307 Last data filed at 06/09/12 1100  Gross per 24 hour  Intake   1963 ml  Output   1175 ml  Net    788 ml   Exam: General: Awake alert oriented x3, No acute respiratory distress Lungs: Clear to auscultation bilaterally without wheezes or crackles Cardiovascular: Regular rate and normal sinus rhythm without murmur gallop or rub Abdomen: Nontender, mildly distended, soft Musculoskeletal: No significant cyanosis, clubbing of bilateral lower extremities   Data Reviewed: Basic Metabolic Panel:  Recent Labs Lab 06/04/12 0400  06/06/12 0517 06/07/12 0405 06/07/12 1217 06/07/12 1931 06/08/12 0535 06/09/12 0555  NA 139  < > 141 140 138 137 138 141  K 3.7  < > 3.0* 2.7* 3.1* 3.4* 3.5 3.8  CL 105  < > 104 100 97 101 102 103  CO2 20  < > 26 29 30 29 26 29   GLUCOSE 107*  < > 114* 114* 122* 145* 117* 112*  BUN 15  < > 16 16 16 20 22 22   CREATININE 0.87  < > 0.80 0.89 1.02 1.03 1.00 1.07  CALCIUM 8.8  < > 8.9 9.0 9.2 8.4 8.6 8.6  MG 1.7  --  1.7 1.9  --   --   --   --   PHOS  --   --  2.6 2.9  --   --   --   --   < > = values in this interval not displayed. CBC:  Recent Labs Lab 06/04/12 0400 06/05/12 0355 06/06/12 0517 06/07/12 0405 06/08/12 0535  WBC 13.0* 13.1* 11.3* 8.9 7.9  HGB 12.0* 11.3* 11.0* 11.2* 11.7*  HCT 34.0* 31.6* 30.8* 32.3* 33.5*  MCV 95.5 95.8 93.3 94.4 95.2  PLT  235 243 242 272 301   Cardiac Enzymes:  Recent Labs Lab 06/05/12 0650 06/05/12 1140 06/05/12 1709  TROPONINI 0.35* 0.33* <0.30    Recent Results (from the past 240 hour(s))  URINE CULTURE     Status: None   Collection Time    05/30/12  7:42 PM      Result Value Range Status   Specimen Description URINE, RANDOM   Final   Special Requests NONE   Final   Culture  Setup Time 05/31/2012 03:28   Final   Colony Count NO GROWTH   Final   Culture NO GROWTH   Final   Report Status 06/01/2012 FINAL   Final  CLOSTRIDIUM DIFFICILE BY PCR     Status: None   Collection Time    05/30/12  9:36 PM      Result Value Range Status   C difficile by pcr NEGATIVE  NEGATIVE Final  OVA AND PARASITE EXAMINATION     Status: None   Collection Time    05/30/12  9:36 PM      Result Value Range Status   Specimen Description STOOL  Final   Special Requests Normal   Final   Ova and parasites NO OVA OR PARASITES SEEN   Final   Report Status 06/05/2012 FINAL   Final  URINE CULTURE     Status: None   Collection Time    05/31/12  2:23 AM      Result Value Range Status   Specimen Description URINE, CATHETERIZED   Final   Special Requests NONE   Final   Culture  Setup Time 05/31/2012 03:39   Final   Colony Count NO GROWTH   Final   Culture NO GROWTH   Final   Report Status 06/01/2012 FINAL   Final  MRSA PCR SCREENING     Status: None   Collection Time    05/31/12  2:46 AM      Result Value Range Status   MRSA by PCR NEGATIVE  NEGATIVE Final   Comment:            The GeneXpert MRSA Assay (FDA     approved for NASAL specimens     only), is one component of a     comprehensive MRSA colonization     surveillance program. It is not     intended to diagnose MRSA     infection nor to guide or     monitor treatment for     MRSA infections.     Studies:  Recent x-ray studies have been reviewed in detail by the Attending Physician  Scheduled Meds:  Scheduled Meds: . feeding supplement  1  Container Oral TID BM  . metoprolol  5 mg Intravenous Q8H  . pantoprazole sodium  40 mg Oral BID AC  . rivaroxaban  20 mg Oral Q supper  . sodium chloride  3 mL Intravenous Q12H   Continuous Infusions: . sodium chloride 10 mL/hr at 06/08/12 1906    Time spent on care of this patient: 35 minutes  Lonia Blood, MD [facility] Office  [phone] Pager [phone]  On-Call/Text Page:      [email or web address]      password [email or web address]  If 7PM-7AM, please contact night-coverage [email or web address] Password [email or web address] 06/09/2012, 1:07 PM   LOS: 10 days

## 2012-06-09 NOTE — Progress Notes (Signed)
Telemetry reviewed and brief discussion with patient.  He is awaiting oncology evaluation.  Rhythm is NSR with one couplet on alarm.  No serious rhythm abnormalities. Continue on current treatment plan.

## 2012-06-09 NOTE — Progress Notes (Signed)
Patient ID: [name] [name], male   DOB: 02/10/1931, 79 y.o.   MRN: [id number] 6 Days Post-Op  Subjective: Pt feels better today.  Less bloated.  No nausea.  Tolerating clears.  +flatus  Objective: Vital signs in last 24 hours: Temp:  [97.3 F (36.3 C)-98.4 F (36.9 C)] 98.1 F (36.7 C) (03/23 0500) Pulse Rate:  [70-87] 70 (03/23 0500) Resp:  [17-27] 17 (03/23 0500) BP: (79-126)/(57-87) 114/69 mmHg (03/23 0500) SpO2:  [80 %-100 %] 100 % (03/23 0500) Weight:  [187 lb 6.3 oz (85 kg)] 187 lb 6.3 oz (85 kg) (03/23 0500) Last BM Date: 06/06/12  Intake/Output from previous day: 03/22 0701 - 03/23 0700 In: 1989.3 [P.O.:960; I.V.:1029.3] Out: 1350 [Urine:1350] Intake/Output this shift:    PE: Abd: soft, less distended, +BS, NT  Lab Results:   Recent Labs  06/07/12 0405 06/08/12 0535  WBC 8.9 7.9  HGB 11.2* 11.7*  HCT 32.3* 33.5*  PLT 272 301   BMET  Recent Labs  06/08/12 0535 06/09/12 0555  NA 138 141  K 3.5 3.8  CL 102 103  CO2 26 29  GLUCOSE 117* 112*  BUN 22 22  CREATININE 1.00 1.07  CALCIUM 8.6 8.6   PT/INR No results found for this basename: LABPROT, INR,  in the last 72 hours CMP     Component Value Date/Time   NA 141 06/09/2012 0555   K 3.8 06/09/2012 0555   CL 103 06/09/2012 0555   CO2 29 06/09/2012 0555   GLUCOSE 112* 06/09/2012 0555   BUN 22 06/09/2012 0555   CREATININE 1.07 06/09/2012 0555   CALCIUM 8.6 06/09/2012 0555   PROT 5.0* 06/09/2012 0555   ALBUMIN 2.2* 06/09/2012 0555   AST 27 06/09/2012 0555   ALT 18 06/09/2012 0555   ALKPHOS 144* 06/09/2012 0555   BILITOT 0.4 06/09/2012 0555   GFRNONAA 64* 06/09/2012 0555   GFRAA 74* 06/09/2012 0555   Lipase  No results found for this basename: lipase       Studies/Results: Dg Swallowing Func-speech Pathology  06/07/2012  [name] [name], CCC-SLP     06/07/2012 10:39 AM Objective Swallowing Evaluation: Modified Barium Swallowing Study   Patient Details  Name: [name] [name] MRN: [id number]  Date of Birth: February 11, 1931  Today's Date: 06/07/2012 Time: 0940-1005 SLP Time Calculation (min): 25 min  Past Medical History:  Past Medical History  Diagnosis Date  . Ramsay Hunt auricular syndrome   . HTN (hypertension)   . Osteoarthritis   . Dysrhythmia 04/17/12  . Anxiety   . Stroke   . H/O Bell's palsy at age 82  . Atrial flutter   . Atrial fibrillation    Past Surgical History:  Past Surgical History  Procedure Laterality Date  . Hernia repair    . Vasectomy    . Prostate biopsies      per Vonita Moss  . Cataract extraction      SE eye  . Colonoscopy N/A 06/03/2012    Procedure: COLONOSCOPY;  Surgeon: Florencia Reasons, MD;   Location: [facility] ENDOSCOPY;  Service: Endoscopy;  Laterality: N/A;  . Esophagogastroduodenoscopy N/A 06/03/2012    Procedure: ESOPHAGOGASTRODUODENOSCOPY (EGD);  Surgeon: Florencia Reasons, MD;  Location: [facility] ENDOSCOPY;  Service: Endoscopy;   Laterality: N/A;   HPI:  79 yo WM with a pmh of stroke/caf on pradaxa who presented to  [facility] ED 3-13 with 2 days of abd pain and distension. Admitted to  hospital and underwent EGD 3-17 and multiple bxs were  obtained  the gastric and the colonic biopsies show malignancy, probable  adenocarcinoma. He developed increased wob and clinical findings  consistent with pulmonary edema(crackles) and was transferred to  ICU from SDU. He did not tolerate NIMVS (claustrophobia) but  responded well to lasix and NTG. Note he has speech changes  (dysarthia and thick tongue) CT head 3-19 with no new changes.  Upper GI on 01/04/12 shows Moderate tertiary contractions in the  mid and distal esophagus, swallowing mechanism normal. Bedside  swallow revealed concerning signs of aspiration, objective test  needed.      Assessment / Plan / Recommendation Clinical Impression  Dysphagia Diagnosis: Mild oral phase dysphagia;Mild pharyngeal  phase dysphagia;Mild cervical esophageal phase  dysphagia;Suspected primary esophageal dysphagia Clinical impression: Pt demonstrates a mild oral,  pharyngeal and  cervical esohageal phase dysphagia, which are likely all part of  pts baseline function. Pt has a baseline left lingual, labial  weakness resulting in mild anterior spillage on the left and oral  residuals. Pt is able to improve oral control by consuming  liquids with a straw place in the right side of the mouth. There  is a mild sensory motor pharyngeal deficits associated with  decreased base of tongue retraction and epiglottic deflection.  The swallow is delayed to the level of the pyriform sinuses with  thin liquids. Mild, unsensed residual remains post swallow with  liquids and solids likely due to weakness and limited passage of  bolus through UES. There was no penetration or aspiration  observed during this study.   Also of concern was the appearance of poor esophageal transit of  soldis and liquids. Stasis remains throughout the esophageal  column.  Pt is recommended to consume thin liquids, will defer to  surgery for diet textuere given GI pathology. Pt is physically  able to masticate regular solids but may need softer texture for  digestion. Offered pt and family compensatory strategies to clear  residuals, and esophageal precautions written and verbal. Pt and  wife verbalize and demonstrate understanding. As pt's speech is  much improved today, no further SLP f/u needed at this time.     Treatment Recommendation  No treatment recommended at this time    Diet Recommendation Thin liquid   Liquid Administration via: Straw Medication Administration: Whole meds with liquid Supervision: Patient able to self feed;Full supervision/cueing  for compensatory strategies Compensations: Slow rate;Small sips/bites;Check for anterior  loss;Multiple dry swallows after each bite/sip;Follow solids with  liquid Postural Changes and/or Swallow Maneuvers: Seated upright 90  degrees;Upright 30-60 min after meal    Other  Recommendations Oral Care Recommendations: Oral care BID   Follow Up Recommendations  None     Frequency and Duration        Pertinent Vitals/Pain NA    SLP Swallow Goals     General HPI: 79 yo WM with a pmh of stroke/caf on pradaxa who  presented to [facility] ED 3-13 with 2 days of abd pain and distension.  Admitted to hospital and underwent EGD 3-17 and multiple bxs were  obtained the gastric and the colonic biopsies show malignancy,  probable adenocarcinoma. He developed increased wob and clinical  findings consistent with pulmonary edema(crackles) and was  transferred to ICU from SDU. He did not tolerate NIMVS  (claustrophobia) but responded well to lasix and NTG. Note he has  speech changes (dysarthia and thick tongue) CT head 3-19 with no  new changes. Upper GI on 01/04/12 shows Moderate tertiary  contractions in the mid  and distal esophagus, swallowing  mechanism normal. Bedside swallow revealed concerning signs of  aspiration, objective test needed.  Type of Study: Modified Barium Swallowing Study Reason for Referral: Objectively evaluate swallowing function Previous Swallow Assessment: noe Diet Prior to this Study: NPO Temperature Spikes Noted: No Respiratory Status: Room air History of Recent Intubation: No Behavior/Cognition: Cooperative;Pleasant mood;Lethargic Oral Cavity - Dentition: Adequate natural dentition Oral Motor / Sensory Function: Impaired - see Bedside swallow  eval Self-Feeding Abilities: Able to feed self Patient Positioning: Upright in chair Baseline Vocal Quality: Clear Volitional Cough: Strong Volitional Swallow: Able to elicit Anatomy: Other (Comment) (Appearance of osteophytic changes at  C5/6/7) Pharyngeal Secretions: Not observed secondary MBS    Reason for Referral Objectively evaluate swallowing function   Oral Phase Oral Preparation/Oral Phase Oral Phase: Impaired Oral - Thin Oral - Thin Cup: Left anterior bolus loss;Weak lingual  manipulation;Left pocketing in lateral sulci;Pocketing in  anterior sulcus;Lingual/palatal residue Oral - Thin Straw: Weak lingual  manipulation;Lingual/palatal  residue Oral - Solids Oral - Puree: Weak lingual manipulation;Lingual/palatal residue Oral - Regular: Weak lingual manipulation;Lingual/palatal residue Oral - Pill: Lingual/palatal residue   Pharyngeal Phase Pharyngeal Phase Pharyngeal Phase: Impaired Pharyngeal - Thin Pharyngeal - Thin Cup: Delayed swallow initiation;Premature  spillage to pyriform sinuses;Reduced tongue base  retraction;Reduced epiglottic inversion;Pharyngeal residue -  valleculae;Pharyngeal residue - pyriform sinuses;Pharyngeal  residue - cp segment Pharyngeal - Thin Straw: Delayed swallow initiation;Premature  spillage to pyriform sinuses;Reduced tongue base  retraction;Reduced epiglottic inversion;Pharyngeal residue -  valleculae;Pharyngeal residue - pyriform sinuses;Pharyngeal  residue - cp segment Pharyngeal - Solids Pharyngeal - Puree: Delayed swallow initiation;Reduced tongue  base retraction;Reduced epiglottic inversion;Pharyngeal residue -  valleculae;Pharyngeal residue - pyriform sinuses Pharyngeal - Regular: Delayed swallow initiation;Reduced tongue  base retraction;Reduced epiglottic inversion;Pharyngeal residue -  valleculae;Pharyngeal residue - pyriform sinuses  Cervical Esophageal Phase    GO    Cervical Esophageal Phase Cervical Esophageal Phase: Impaired Cervical Esophageal Phase - Comment Cervical Esophageal Comment: Due to apperance of impingement form  cervical spine at C5 to C7 there was reduced opening othe UES  with pild cervical esophageal residuals and 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 There was also mild residual in pyriform sinuses  due to decreased transit of bolus. No radiologist present to  confirm.          [name], [name] Nearing 06/07/2012, 10:31 AM      Anti-infectives: Anti-infectives   Start     Dose/Rate Route Frequency Ordered Stop   05/30/12 2200  ciprofloxacin (CIPRO) IVPB 400 mg  Status:  Discontinued     400 mg 200 mL/hr over 60 Minutes Intravenous Every 12 hours  05/30/12 2123 06/04/12 0746   05/30/12 2200  metroNIDAZOLE (FLAGYL) IVPB 500 mg  Status:  Discontinued     500 mg 100 mL/hr over 60 Minutes Intravenous Every 8 hours 05/30/12 2123 06/04/12 0746       Assessment/Plan  1. Adenocarcinoma, likely gastric primary with stage 4 disease 2. ? PBO of colon secondary to met disease  Plan: 1. Will give full liquids today since abdominal exam is improved 2. Will d/w Dr. Truett Perna tomorrow for further work up and treatment plan. 3. If patient continues to be able to eat and pass BMs, then shouldn't need a palliative surgery at this point.  Will follow tolerance of oral intake.   LOS: 10 days    Vondell Babers E 06/09/2012, 8:06 AM Pager: [phone]

## 2012-06-09 NOTE — Progress Notes (Signed)
Await oncology input.

## 2012-06-09 NOTE — Progress Notes (Signed)
Progress Note from the Palliative Medicine Team at [facility]  Subjective: patient is OOB to chair, wife at bedside, "very weak but feeling alittle better today"  Looking forward to meeting with Dr Truett Perna, early this week, in hopes of gathering more info for informed decisions regarding GOC   Objective: Allergies  Allergen Reactions  . Lactose Intolerance (Gi)   . Penicillins Rash    Rash appeared on palms of hands and feet.   Scheduled Meds: . feeding supplement  1 Container Oral TID BM  . metoprolol  5 mg Intravenous Q8H  . pantoprazole sodium  40 mg Oral BID AC  . rivaroxaban  20 mg Oral Q supper  . sodium chloride  3 mL Intravenous Q12H   Continuous Infusions: . sodium chloride 10 mL/hr at 06/08/12 1906   PRN Meds:.acetaminophen, acetaminophen, morphine CONCENTRATE, ondansetron (ZOFRAN) IV  BP 114/74  Pulse 71  Temp(Src) 98.3 F (36.8 C) (Oral)  Resp 25  Ht 6\' 1"  (1.854 m)  Wt 85 kg (187 lb 6.3 oz)  BMI 24.73 kg/m2  SpO2 96%   PPS:30 % at best  Pain Score:denies presetnly Pain Location stiff neck   Intake/Output Summary (Last 24 hours) at 06/09/12 1104 Last data filed at 06/09/12 0800  Gross per 24 hour  Intake 1884.67 ml  Output   1175 ml  Net 709.67 ml       Physical Exam: General: chronically ill appearing  HEENT: moist buccal membranes, no exudate  Chest: Diminished in bases CTA  CVS: RRR  Abdomen: firm, distended, non tender +BS--"passing some gas"  Ext: without edema  Neuro:alert and oriented X3   Labs: CBC    Component Value Date/Time   WBC 7.9 06/08/2012 0535   RBC 3.52* 06/08/2012 0535   HGB 11.7* 06/08/2012 0535   HCT 33.5* 06/08/2012 0535   PLT 301 06/08/2012 0535   MCV 95.2 06/08/2012 0535   MCH 33.2 06/08/2012 0535   MCHC 34.9 06/08/2012 0535   RDW 13.3 06/08/2012 0535   LYMPHSABS 0.7 05/30/2012 0926   MONOABS 0.8 05/30/2012 0926   EOSABS 0.0 05/30/2012 0926   BASOSABS 0.0 05/30/2012 0926    BMET    Component Value Date/Time   NA  141 06/09/2012 0555   K 3.8 06/09/2012 0555   CL 103 06/09/2012 0555   CO2 29 06/09/2012 0555   GLUCOSE 112* 06/09/2012 0555   BUN 22 06/09/2012 0555   CREATININE 1.07 06/09/2012 0555   CALCIUM 8.6 06/09/2012 0555   GFRNONAA 64* 06/09/2012 0555   GFRAA 74* 06/09/2012 0555    CMP     Component Value Date/Time   NA 141 06/09/2012 0555   K 3.8 06/09/2012 0555   CL 103 06/09/2012 0555   CO2 29 06/09/2012 0555   GLUCOSE 112* 06/09/2012 0555   BUN 22 06/09/2012 0555   CREATININE 1.07 06/09/2012 0555   CALCIUM 8.6 06/09/2012 0555   PROT 5.0* 06/09/2012 0555   ALBUMIN 2.2* 06/09/2012 0555   AST 27 06/09/2012 0555   ALT 18 06/09/2012 0555   ALKPHOS 144* 06/09/2012 0555   BILITOT 0.4 06/09/2012 0555   GFRNONAA 64* 06/09/2012 0555   GFRAA 74* 06/09/2012 0555     Assessment and Plan: 1. Code Status: Full code 2. Symptom Control:Pain-Roxanol 5-10  mg prn every 3 hrs (increased dose patient reports little relief form low dose of 5 mg yesterday)          -K-Pad for neck prn  Weakness: PT and self AROM exercises  3. Psycho/Social: Continued emotional support for patient and wife at bedside, This is a very difficult time for both as they continue to process the recent information and sort out their options.  Continued conversation about importance gathering all information for informed decision making, and keeping values and GOC center to future decisions. 4. Spiritual refuses chaplain services at this time 5. Disposition: Dependant on outcomes, open to rehabilitation if it fits into realistic plan.   Time In Time Out Total Time Spent with Patient Total Overall Time  1015 1050 35 min 35 min    Greater than 50%  of this time was spent counseling and coordinating care related to the above assessment and plan.  Lorinda Creed NP  Palliative Medicine Team Team Phone # [phone] Pager [phone]   1

## 2012-06-10 LAB — BASIC METABOLIC PANEL
CO2: 28 mEq/L (ref 19–32)
Chloride: 102 mEq/L (ref 96–112)
Glucose, Bld: 114 mg/dL — ABNORMAL HIGH (ref 70–99)
Sodium: 138 mEq/L (ref 135–145)

## 2012-06-10 MED ORDER — DILTIAZEM HCL 30 MG PO TABS
30.0000 mg | ORAL_TABLET | Freq: Four times a day (QID) | ORAL | Status: DC
  Administered 2012-06-10 – 2012-06-12 (×8): 30 mg via ORAL
  Filled 2012-06-10 (×13): qty 1

## 2012-06-10 NOTE — Progress Notes (Signed)
[facility] Progress Note [place] TEAM 1 - Stepdown/ICU TEAM   [name] [name] ZOX:[id number] DOB: 04-08-1930 DOA: 05/30/2012 PCP: Miguel Aschoff, MD  Brief narrative: 79 year old male with multiple medical problems including chronic atrial fibrillation on pradaxa, hx of stroke, hypertension, and gastroparesis who presented to ED with complaints of abdominal distention with associated coffee-ground emesis. The symptoms started 2 days prior with abdominal pain diffusely with cramping sensation, rated 7-8/10. Subsequently he noticed that his abdomen was getting distended. This was associated with intermittent diarrhea over a month. He had been evaluated by his gastroenterologist (Dr. Madilyn Fireman) who advised lactose-free diet but unfortunately he saw no improvement in the diarrhea. On the morning of admission he reported feeling dizzy and lightheaded and had an episode of diarrhea. He had a syncopal episode.  Patient denied hitting his head and was witnessed by his wife. Per his wife, he was out for only a few seconds. When patient presented to the ED, he was noted to be in rapid A. Fib, HR in 130's, hypotensive BP in 80's, he was placed on IV fluids. He had 2 episodes of coffee-ground emesis in the ED. FOBT was positive in ED. A CT scan of the abdomen was obtained due to the abdominal distention and coffee-ground emesis which showed moderate ascites with diffuse fluid filled and dilated small bowel likely mechanical small bowel obstruction with potentail ischemic/infectious and inflammatory etiologies.   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 Darnelle Catalan has volunteered to be his Hospice MD - Dr. Truett Perna to see Tuesday as patient is interested in exploring treatment options -Appreciate Palliative assistance  Hematemesis / gastric CA -advancing diet slowly  - tolerating thus far (fulls) would like to order mashed potatoes/pumpkin pie w/o crust when OK w/GI/surgery (pt request) -no evidence of bleeding on xarelto (chosen for decreased risk of GIB) at this time   Acute delirium / TIA vs CVA / acute dysarthria -CT Head negative - consulted Neuro -MRI initially delayed due to hypotension, and would likely add little to his current care therefore not to be pursued -dysarthria resolved -had apparent admit for CVA Jan 2014 in [facility] and was also admitted to [facility] Jan 2014 for expressive aphasia  Diarrhea  -resolved in absence of significant intake   Rapid atrial fibrillation/flutter -Cardizem gtt stopped due to hypotension - BP more stable so begin SA CCB every 6 hrs -Cardiology following -Cont IV Lopressor  -NSR on monitor at this time  -keep K+ >4.0  Hypokalemia -resolved -Was 2/2 high output diarrhea  Elevated cardiac enzymes -Likely due to rapid heart rate/ demand ischemia - normalized as of 3/19  Hx of HYPERTENSION, BENIGN SYSTEMIC not an active issue at this time   Code Status: Full code Family Communication: discussed with pt Disposition Plan: SDU DVT prophylaxis: Xarelto  Consultants: Gastroenterology Cardiology Neurology Oncology  Procedures: EGD (06/03/12)  1. Mucosal edema and slight nonerosive gastric erythema. Findings are nonspecific.  2.? Obstructive sleep apnea based on tolerance of procedure as  described above  RECOMMENDATIONS:  1. Await pathology results  2. Proceed to colonoscopic evaluation   Colonoscopy (/06/03/12) 1. Significant mucosal edema and spasm in the mid transverse colon. The etiology is not clear, but I wonder about ischemia. Efforts to advance the scope more proximally were unsuccessful within the constraints of the exam in terms of patient sedation tolerance, see above discussion.  RECOMMENDATIONS:  1. Await pathology results. Supportive care in the meantime.  Antibiotics: Ciprofloxacin 3/13>>3/18 Flagyl 3/13>>3/18  HPI/Subjective: Alert and pleasant.  No focal neuro deficits from baseline.  No recent diarrhea.  Denies hunger.  No cp or sob.    Objective: Blood pressure 129/79, pulse 70, temperature 98.5 F (36.9 C), temperature source Oral, resp. rate 16, height 6\' 1"  (1.854 m), weight 88 kg (194 lb 0.1 oz), SpO2 94.00%.  Intake/Output Summary (Last 24 hours) at 06/10/12 1116 Last data filed at 06/10/12 0738  Gross per 24 hour  Intake    663 ml  Output    750 ml  Net    -87 ml   Exam: General: Awake alert oriented x3, No acute respiratory distress Lungs: Clear to auscultation bilaterally without wheezes or crackles Cardiovascular: Regular rate and normal sinus rhythm without murmur gallop or rub Abdomen: Nontender, mildly distended, soft Musculoskeletal: No significant cyanosis, clubbing of bilateral lower extremities   Data Reviewed: Basic Metabolic Panel:  Recent Labs Lab 06/04/12 0400  06/06/12 0517 06/07/12 0405 06/07/12 1217 06/07/12 1931 06/08/12 0535 06/09/12 0555 06/10/12 0544  NA 139  < > 141 140 138 137 138 141 138  K 3.7  < > 3.0* 2.7* 3.1* 3.4* 3.5 3.8 4.6  CL 105  < > 104 100 97 101 102 103 102  CO2 20  < > 26 29 30 29 26 29 28   GLUCOSE 107*  < > 114* 114* 122* 145* 117* 112* 114*  BUN 15  < > 16 16 16 20 22 22 20   CREATININE 0.87  < > 0.80 0.89 1.02 1.03 1.00 1.07 0.97  CALCIUM 8.8  < > 8.9 9.0 9.2 8.4 8.6 8.6 9.1  MG  1.7  --  1.7 1.9  --   --   --   --   --   PHOS  --   --  2.6 2.9  --   --   --   --   --   < > = values in this interval not displayed. CBC:  Recent Labs Lab 06/04/12 0400 06/05/12 0355 06/06/12 0517 06/07/12 0405 06/08/12 0535  WBC 13.0* 13.1* 11.3* 8.9 7.9  HGB 12.0* 11.3* 11.0* 11.2* 11.7*  HCT 34.0* 31.6* 30.8* 32.3* 33.5*  MCV 95.5 95.8 93.3 94.4 95.2  PLT 235 243 242 272 301   Cardiac Enzymes:  Recent Labs Lab 06/05/12 0650 06/05/12 1140 06/05/12 1709  TROPONINI 0.35* 0.33* <0.30   Studies:  Recent x-ray studies have been reviewed in detail by the Attending Physician  Scheduled Meds:  Scheduled Meds: . diltiazem  30 mg Oral Q6H  . feeding supplement  1 Container Oral TID BM  . pantoprazole sodium  40 mg Oral BID AC  . rivaroxaban  20 mg Oral Q supper  . sodium chloride  3 mL Intravenous Q12H   Continuous Infusions: . sodium chloride 250 mL (06/09/12 2149)    Time spent on care of this patient: 35 minutes  Junious Silk, ANP [facility] Office  [phone] Pager [phone]  On-Call/Text Page:      [email or web address]      password [email or web address]  If 7PM-7AM, please contact night-coverage [email or web address] Password [email or web address] 06/10/2012, 11:16 AM   LOS: 11 days   I have personally examined this patient and reviewed the entire database. I have reviewed the above note, made any necessary editorial changes, and agree with its content.  Lonia Blood, MD [facility]

## 2012-06-10 NOTE — Progress Notes (Signed)
7 Days Post-Op  Subjective: Tolerating fulls, some gas but no BM, no N/V  Objective: Vital signs in last 24 hours: Temp:  [97.6 F (36.4 C)-98.9 F (37.2 C)] 98.5 F (36.9 C) (03/24 0734) Pulse Rate:  [70-82] 70 (03/24 0734) Resp:  [16-26] 16 (03/24 0734) BP: (101-129)/(68-79) 129/79 mmHg (03/24 0734) SpO2:  [91 %-94 %] 94 % (03/24 0734) Weight:  [88 kg (194 lb 0.1 oz)] 88 kg (194 lb 0.1 oz) (03/24 0430) Last BM Date: 06/06/12  Intake/Output from previous day: 03/23 0701 - 03/24 0700 In: 963 [P.O.:720; I.V.:243] Out: 700 [Urine:700] Intake/Output this shift: Total I/O In: -  Out: 150 [Urine:150]  General appearance: alert and cooperative Resp: clear to auscultation bilaterally Cardio: Reg with occasional ectopy GI: soft, mild distention, very active BS, NT  Lab Results:   Recent Labs  06/08/12 0535  WBC 7.9  HGB 11.7*  HCT 33.5*  PLT 301   BMET  Recent Labs  06/09/12 0555 06/10/12 0544  NA 141 138  K 3.8 4.6  CL 103 102  CO2 29 28  GLUCOSE 112* 114*  BUN 22 20  CREATININE 1.07 0.97  CALCIUM 8.6 9.1   PT/INR No results found for this basename: LABPROT, INR,  in the last 72 hours ABG No results found for this basename: PHART, PCO2, PO2, HCO3,  in the last 72 hours  Studies/Results: No results found.  Anti-infectives: Anti-infectives   Start     Dose/Rate Route Frequency Ordered Stop   05/30/12 2200  ciprofloxacin (CIPRO) IVPB 400 mg  Status:  Discontinued     400 mg 200 mL/hr over 60 Minutes Intravenous Every 12 hours 05/30/12 2123 06/04/12 0746   05/30/12 2200  metroNIDAZOLE (FLAGYL) IVPB 500 mg  Status:  Discontinued     500 mg 100 mL/hr over 60 Minutes Intravenous Every 8 hours 05/30/12 2123 06/04/12 0746      Assessment/Plan: s/p Procedure(s): COLONOSCOPY (N/A) ESOPHAGOGASTRODUODENOSCOPY (EGD) (N/A) 1. Adenocarcinoma, likely gastric primary with stage 4 disease 2. ? PBO of colon secondary to met disease VS synchronous  primary  Plan: 1. Continue fulls today 2. Dr. Truett Perna will see tomorrow AM to make further plans I spoke at length with the patient and his family regarding the clinical situation and plan of care   LOS: 11 days    Carnisha Feltz E 06/10/2012

## 2012-06-10 NOTE — Progress Notes (Signed)
Rehab Admissions Coordinator Note:  Patient was screened by Meryl Dare for appropriateness for an Inpatient Acute Rehab Consult. Noted that decisions are pending re: patient's cancer treatment/surgery. Will evaluate for CIR after plan of care is determined.   Meryl Dare 06/10/2012, 12:33 PM  I can be reached at [phone].

## 2012-06-10 NOTE — Progress Notes (Signed)
    SUBJECTIVE: No chest pain. Denies SOB on O2 via nasal cannula.   BP 129/79  Pulse 70  Temp(Src) 98.5 F (36.9 C) (Oral)  Resp 16  Ht 6\' 1"  (1.854 m)  Wt 194 lb 0.1 oz (88 kg)  BMI 25.6 kg/m2  SpO2 94%  Intake/Output Summary (Last 24 hours) at 06/10/12 0741 Last data filed at 06/10/12 4098  Gross per 24 hour  Intake    963 ml  Output    850 ml  Net    113 ml    PHYSICAL EXAM General: Well developed, well nourished, in no acute distress. Alert and oriented x 3.  Psych:  Good affect, responds appropriately Neck: No JVD. No masses noted.  Lungs: Clear bilaterally with no wheezes or rhonci noted.  Heart: RRR with no murmurs noted. Abdomen: Bowel sounds are present. Soft, non-tender.  Extremities: No lower extremity edema.   LABS: Basic Metabolic Panel:  Recent Labs  11/91/47 0555 06/10/12 0544  NA 141 138  K 3.8 4.6  CL 103 102  CO2 29 28  GLUCOSE 112* 114*  BUN 22 20  CREATININE 1.07 0.97  CALCIUM 8.6 9.1   CBC:  Recent Labs  06/08/12 0535  WBC 7.9  HGB 11.7*  HCT 33.5*  MCV 95.2  PLT 301    Current Meds: . feeding supplement  1 Container Oral TID BM  . metoprolol  5 mg Intravenous Q8H  . pantoprazole sodium  40 mg Oral BID AC  . rivaroxaban  20 mg Oral Q supper  . sodium chloride  3 mL Intravenous Q12H     ASSESSMENT AND PLAN:Mr. Haskett is a 79 year old male with history of atrial fibrillation, previously on pradaxa for long term anti-coagulation with recurrent CVAs admitted with GI bleeding. Pradaxa held on admission for GI bleed. Maintaining NSR with some ectopy. Currently awaiting oncology evaluation for new diagnosis of stage 4 adenocarcinoma of the stomach.   1. Atrial fibrillation, paroxysmal: Currently in NSR. Continue IV metoprolol. He has been started on Xarelto for anti-coagulation. He is on Cardizem CD 120 mg po Qdaily at home. Would transition to po when ok with surgical team.    2. Chronic diastolic CHF: Volume ok. I/O matched.  Systolic function normal on echo 3/18. No significant valvular disease.   3. CVA: Per primary team. On Xarelto for anti-coagulation.  4. 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 Palliative care consult.   MCALHANY,CHRISTOPHER  3/24/20147:41 AM

## 2012-06-10 NOTE — Progress Notes (Signed)
Physical Therapy Treatment Patient Details Name: [name] [name] MRN: [id number] DOB: 02/12/1931 Today's Date: 06/10/2012 Time: 4098-1191 PT Time Calculation (min): 39 min  PT Assessment / Plan / Recommendation Comments on Treatment Session  Patient tolerating little more activity with in room ambulation with some desaturation on 2L O2 and with some lightheadedness in standing, (though pressure dropped today standing was better than last session.)  Feel may need short intesnsive rehab stay prior to d/c home with wife.  Changing d/c recommendation to CIR, though know much still in works in regards to cancer treatment and possible need for trip to OR per patient/    Follow Up Recommendations  CIR     Does the patient have the potential to tolerate intense rehabilitation   yes  Barriers to Discharge        Equipment Recommendations  Rolling walker with 5" wheels    Recommendations for Other Services Rehab consult  Frequency Min 3X/week   Plan Discharge plan needs to be updated    Precautions / Restrictions Precautions Precautions: Fall   Pertinent Vitals/Pain Min c/o abdominal pain and SpO2 down to 84% on 2L O2 with ambulation; drop in BP with standing activities about 20 systolic    Mobility  Bed Mobility Supine to Sit: 3: Mod assist Details for Bed Mobility Assistance: able to move feet off bed, but needed 50% help to lift trunk upright and to scoot to edge of bed Transfers Sit to Stand: 4: Min assist;From bed;From chair/3-in-1;With upper extremity assist Stand to Sit: 4: Min assist;With armrests;To chair/3-in-1 Stand Pivot Transfers: 4: Min assist Details for Transfer Assistance: cues for hand placement and direction with transfer to 3:1 and back to bed Ambulation/Gait Ambulation/Gait Assistance: 4: Min assist Ambulation Distance (Feet): 10 Feet Assistive device: Rolling walker Ambulation/Gait Assistance Details: stayed in room to ambulate per RN recommendation and due  to drop in BP standing for toilet hygiene (from 123/95 sitting to 102/73 after standing about 1 minute)   Gait Pattern: Trunk flexed;Shuffle;Decreased stride length      PT Goals Acute Rehab PT Goals Pt will go Supine/Side to Sit: with modified independence PT Goal: Supine/Side to Sit - Progress: Progressing toward goal Pt will go Sit to Stand: with supervision PT Goal: Sit to Stand - Progress: Progressing toward goal Pt will go Stand to Sit: with supervision PT Goal: Stand to Sit - Progress: Progressing toward goal Pt will Stand: with min assist;3 - 5 min;with unilateral upper extremity support PT Goal: Stand - Progress: Progressing toward goal Pt will Ambulate: 16 - 50 feet;with min assist;with least restrictive assistive device PT Goal: Ambulate - Progress: Progressing toward goal  Visit Information  Last PT Received On: 06/10/12    Subjective Data  Subjective: Still weak.  Feel like I have to have BM.   Cognition  Cognition Overall Cognitive Status: Appears within functional limits for tasks assessed/performed Arousal/Alertness: Awake/alert Orientation Level: Appears intact for tasks assessed Behavior During Session: [facility] for tasks performed    Balance  Static Sitting Balance Static Sitting - Balance Support: Feet supported Static Sitting - Level of Assistance: 5: Stand by assistance Static Standing Balance Static Standing - Balance Support: Bilateral upper extremity supported Static Standing - Level of Assistance: 5: Stand by assistance Static Standing - Comment/# of Minutes: about 1 minute during assist for toilet hygiene  End of Session PT - End of Session Equipment Utilized During Treatment: Gait belt Activity Tolerance: Patient limited by fatigue Patient left: in chair;with call bell/phone  within reach   GP     [facility] 06/10/2012, 12:15 PM [place], [place] [phone] 06/10/2012

## 2012-06-10 NOTE — Progress Notes (Signed)
PIV   attempted twice unsuccessfully by 2 nurses ,IV team was paged.

## 2012-06-10 NOTE — Evaluation (Signed)
Occupational Therapy Evaluation Patient Details Name: [name] [name] MRN: [id number] DOB: [birth year] Today's Date: 06/10/2012 Time: 4098-1191 OT Time Calculation (min): 26 min  OT Assessment / Plan / Recommendation Clinical Impression  Pt admitted with abdominal pain, distention, and coffee ground emesis.  Found to have adenocarcinoma of stomach and intestines.  Hospital course complicated by acute respiratory failure due to RVR, orthostasis, delirium, afib/flutter.  Pt presents with generalized weakness, decreased activity tolerance, and impaired balance interfering with ADL and mobility.  Pt is to meet with oncology tomorrow and determine POC.  Pt will likely need short term rehab prior to return home with his wife.  Encouraged incentive spirometer as pt has periods of desaturation even at rest.    OT Assessment  Patient needs continued OT Services    Follow Up Recommendations  CIR    Barriers to Discharge      Equipment Recommendations  3 in 1 bedside comode    Recommendations for Other Services Rehab consult  Frequency  Min 2X/week    Precautions / Restrictions Precautions Precautions: Fall Precaution Comments: watch for orthostasis Restrictions Weight Bearing Restrictions: No   Pertinent Vitals/Pain No complaints of pain.    ADL  Eating/Feeding: Independent (increased spillage due to position) Where Assessed - Eating/Feeding: Bed level Grooming: Wash/dry hands;Wash/dry face;Teeth care;Set up Where Assessed - Grooming: Unsupported sitting Upper Body Bathing: Minimal assistance Where Assessed - Upper Body Bathing: Unsupported sitting Lower Body Bathing: Maximal assistance Where Assessed - Lower Body Bathing: Unsupported sitting;Supported sit to stand Upper Body Dressing: Minimal assistance Where Assessed - Upper Body Dressing: Unsupported sitting Lower Body Dressing: Maximal assistance Where Assessed - Lower Body Dressing: Unsupported sitting;Supported sit to  stand Toilet Transfer: Minimal assistance Toilet Transfer Method: Stand pivot Toilet Transfer Equipment:  (to recliner) Toileting - Clothing Manipulation and Hygiene: Maximal assistance Where Assessed - Engineer, mining and Hygiene: Standing Equipment Used: Rolling walker Transfers/Ambulation Related to ADLs: min assist for transfer with RW, did not ambulate with pt ADL Comments: Pt unable to access feet in sitting, impaired balance and weakness interfering with standing ADL.    OT Diagnosis: Generalized weakness  OT Problem List: Decreased strength;Decreased range of motion;Decreased activity tolerance;Impaired balance (sitting and/or standing);Decreased knowledge of use of DME or AE OT Treatment Interventions: Self-care/ADL training;DME and/or AE instruction;Therapeutic activities;Patient/family education;Balance training;Energy conservation   OT Goals Acute Rehab OT Goals OT Goal Formulation: With patient Time For Goal Achievement: 06/24/12 Potential to Achieve Goals: Good ADL Goals Pt Will Perform Grooming: with supervision;Standing at sink (at least 2 activities) ADL Goal: Grooming - Progress: Goal set today Pt Will Perform Upper Body Bathing: Sitting at sink;with supervision ADL Goal: Upper Body Bathing - Progress: Goal set today Pt Will Perform Lower Body Bathing: with min assist;Sitting at sink;Standing at sink ADL Goal: Lower Body Bathing - Progress: Goal set today Pt Will Perform Upper Body Dressing: with set-up;Sitting, bed ADL Goal: Upper Body Dressing - Progress: Goal set today Pt Will Perform Lower Body Dressing: with min assist;Sit to stand from bed ADL Goal: Lower Body Dressing - Progress: Goal set today Pt Will Transfer to Toilet: with supervision;Ambulation;with DME ADL Goal: Toilet Transfer - Progress: Goal set today Pt Will Perform Toileting - Clothing Manipulation: with supervision;Standing ADL Goal: Toileting - Clothing Manipulation - Progress:  Goal set today Pt Will Perform Toileting - Hygiene: with supervision;Sit to stand from 3-in-1/toilet ADL Goal: Toileting - Hygiene - Progress: Goal set today Miscellaneous OT Goals Miscellaneous OT Goal #1: Pt will perform  bed mobility with supervision in preparation for ADL. OT Goal: Miscellaneous Goal #1 - Progress: Goal set today Miscellaneous OT Goal #2: Pt will utilize energy conservation strategies during ADL and mobility with min verbal cues. OT Goal: Miscellaneous Goal #2 - Progress: Goal set today  Visit Information  Last OT Received On: 06/10/12 Assistance Needed: +1    Subjective Data  Subjective: "I'm just so weak." Patient Stated Goal: Walk, take care of self.   Prior Functioning     Home Living Lives With: Spouse Available Help at Discharge: Available 24 hours/day Type of Home: House Home Access: Stairs to enter [employer] of Steps: 3 Home Layout: Two level;Able to live on main level with bedroom/bathroom Alternate Level Stairs-Number of Steps: can stay on main Bathroom Shower/Tub: Health visitor: Standard Home Adaptive Equipment: None Prior Function Level of Independence: Independent Driving: Yes Vocation: Retired Musician: No difficulties Dominant Hand: Right         Vision/Perception Vision - History Baseline Vision: Wears glasses only for reading Patient Visual Report: No change from baseline   Cognition  Cognition Overall Cognitive Status: Appears within functional limits for tasks assessed/performed Arousal/Alertness: Awake/alert Orientation Level: Appears intact for tasks assessed Behavior During Session: [facility] for tasks performed    Extremity/Trunk Assessment Right Upper Extremity Assessment RUE ROM/Strength/Tone Deficits: AROM to 100 degrees in supine shoulder FF, full elbow to hand, first finger amputated RUE Sensation: WFL - Light Touch;WFL - Proprioception RUE Coordination: WFL -  gross/fine motor Left Upper Extremity Assessment LUE ROM/Strength/Tone: Deficits LUE ROM/Strength/Tone Deficits: AROM WFL, strength shoulder flexion 4/5, elbow flexion 4+/5, extension 4/5 LUE Sensation: WFL - Light Touch;WFL - Proprioception LUE Coordination: WFL - gross/fine motor Trunk Assessment Trunk Exceptions: cervical AROM limited with pain and stiffness keeps head turned left, unable to turn towards right when lying in bed     Mobility Bed Mobility Bed Mobility: Supine to Sit;Sit to Supine Supine to Sit: 3: Mod assist Sit to Supine: 3: Mod assist Details for Bed Mobility Assistance: required assist to lift trunk off bed with supine>sit, required assist for LEs with sit >supine Transfers Transfers: Sit to Stand;Stand to Sit Sit to Stand: 4: Min assist;From bed;From chair/3-in-1;With upper extremity assist Stand to Sit: 4: Min assist;To chair/3-in-1;To bed;With upper extremity assist Details for Transfer Assistance: cues for hand placement and technique     Exercise     Balance Balance Balance Assessed: Yes Static Sitting Balance Static Sitting - Balance Support: Feet supported Static Sitting - Level of Assistance: 5: Stand by assistance Static Standing Balance Static Standing - Balance Support: Bilateral upper extremity supported Static Standing - Level of Assistance: 5: Stand by assistance   End of Session OT - End of Session Patient left: in bed;with call bell/phone within reach;with nursing in room Nurse Communication:  (ok to give pt incentive spirometer)  GO     Evern Bio 06/10/2012, 1:09 PM [phone]

## 2012-06-11 DIAGNOSIS — C169 Malignant neoplasm of stomach, unspecified: Secondary | ICD-10-CM | POA: Diagnosis present

## 2012-06-11 DIAGNOSIS — I10 Essential (primary) hypertension: Secondary | ICD-10-CM

## 2012-06-11 LAB — CBC
HCT: 31.6 % — ABNORMAL LOW (ref 39.0–52.0)
MCH: 32.7 pg (ref 26.0–34.0)
MCV: 95.8 fL (ref 78.0–100.0)
RBC: 3.3 MIL/uL — ABNORMAL LOW (ref 4.22–5.81)
WBC: 9.2 10*3/uL (ref 4.0–10.5)

## 2012-06-11 MED ORDER — ENSURE COMPLETE PO LIQD
237.0000 mL | Freq: Three times a day (TID) | ORAL | Status: DC
  Administered 2012-06-11 – 2012-06-18 (×13): 237 mL via ORAL

## 2012-06-11 NOTE — Progress Notes (Signed)
[facility] Progress Note [place] TEAM 1 - Stepdown/ICU TEAM   [name] [name] ZOX:[id number] DOB: 04-10-1930 DOA: 05/30/2012 PCP: Miguel Aschoff, MD  Brief narrative: 79 year old male with multiple medical problems including chronic atrial fibrillation on pradaxa, hx of stroke, hypertension, and gastroparesis who presented to ED with complaints of abdominal distention with associated coffee-ground emesis. The symptoms started 2 days prior with abdominal pain diffusely with cramping sensation, rated 7-8/10. Subsequently he noticed that his abdomen was getting distended. This was associated with intermittent diarrhea over a month. He had been evaluated by his gastroenterologist (Dr. Madilyn Fireman) who advised lactose-free diet but unfortunately he saw no improvement in the diarrhea. On the morning of admission he reported feeling dizzy and lightheaded and had an episode of diarrhea. He had a syncopal episode.  Patient denied hitting his head and was witnessed by his wife. Per his wife, he was out for only a few seconds. When patient presented to the ED, he was noted to be in rapid A. Fib, HR in 130's, hypotensive BP in 80's, he was placed on IV fluids. He had 2 episodes of coffee-ground emesis in the ED. FOBT was positive in ED. A CT scan of the abdomen was obtained due to the abdominal distention and coffee-ground emesis which showed moderate ascites with diffuse fluid filled and dilated small bowel likely mechanical small bowel obstruction with potentail ischemic/infectious and inflammatory etiologies.   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 Darnelle Catalan has volunteered to be his Hospice MD - Dr. Truett Perna to see 06/11/12 as patient is interested in exploring treatment options -[facility] plans laparoscopic exploration, ascites sampling and diverting loop ileostomy after PET scan / this plan pending recs from oncology. -Appreciate Palliative assistance  Hematemesis / gastric CA -advancing diet but pt w/ significant anorexia and early satiety- dislikes the sweetness of the protein supplements/nutrition assisting w/GI/surgery (pt request) -no evidence of bleeding on Xarelto (chosen for decreased risk of GIB) at this time   Acute delirium / TIA vs CVA / acute dysarthria -Resolved -CT Head negative - consulted Neuro -MRI initially delayed due to hypotension, and would likely add little to his current care therefore not to be pursued -had apparent admit for CVA Jan 2014 in [facility] and was also admitted to [facility] Jan 2014 for expressive aphasia  Diarrhea  -resolved in absence of significant intake   Rapid atrial fibrillation/flutter -Cardizem gtt stopped due to hypotension - BP more stable and tolerating short acting CCB -Cardiology following -Cont IV Lopressor  -NSR on monitor at this time  -keep K+ >4.0  Hypokalemia -resolved -Was 2/2 high output diarrhea  Elevated cardiac enzymes -Likely due to rapid heart rate/ demand ischemia - normalized as of 3/19  Hx of HYPERTENSION, BENIGN SYSTEMIC not an active issue at this time   Code Status: Full code Family Communication: discussed with pt Disposition Plan: SDU until surgical plan clarified- leave Foley in place until surgical plan clarified DVT prophylaxis:  Xarelto  Consultants: Gastroenterology Cardiology Neurology Oncology  Procedures: EGD (06/03/12)  1. Mucosal edema and slight nonerosive gastric erythema. Findings are nonspecific.  2.? Obstructive sleep apnea based on tolerance of procedure as described above  RECOMMENDATIONS:  1. Await pathology results  2. Proceed to colonoscopic evaluation   Colonoscopy (/06/03/12) 1. Significant mucosal edema and spasm in the mid transverse colon. The etiology is not clear, but I wonder about ischemia. Efforts to advance the scope more proximally were unsuccessful within the constraints of the exam in terms of patient sedation tolerance, see above discussion.  RECOMMENDATIONS:  1. Await pathology results. Supportive care in the meantime.  Antibiotics: Ciprofloxacin 3/13>>3/18 Flagyl 3/13>>3/18  HPI/Subjective: Alert and pleasant seems more weak today. No CP/SOB.  Objective: Blood pressure 119/68, pulse 78, temperature 97.4 F (36.3 C), temperature source Oral, resp. rate 23, height 6\' 1"  (1.854 m), weight 86.4 kg (190 lb 7.6 oz), SpO2 95.00%.  Intake/Output Summary (Last 24 hours) at 06/11/12 1242 Last data filed at 06/11/12 0907  Gross per 24 hour  Intake    796 ml  Output   1250 ml  Net   -454 ml   Exam: General: Awake alert oriented x3, No acute respiratory distress Lungs: Clear to auscultation bilaterally without wheezes or crackles Cardiovascular: Regular rate and normal sinus rhythm without murmur gallop or rub Abdomen: Nontender, mildly distended, soft Musculoskeletal: No significant cyanosis, clubbing of bilateral lower extremities   Data Reviewed: Basic Metabolic Panel:  Recent Labs Lab 06/06/12 0517 06/07/12 0405 06/07/12 1217 06/07/12 1931 06/08/12 0535 06/09/12 0555 06/10/12 0544  NA 141 140 138 137 138 141 138  K 3.0* 2.7* 3.1* 3.4* 3.5 3.8 4.6  CL 104 100 97 101 102 103 102  CO2 26 29 30 29 26 29 28   GLUCOSE 114* 114* 122* 145* 117* 112* 114*  BUN 16 16  16 20 22 22 20   CREATININE 0.80 0.89 1.02 1.03 1.00 1.07 0.97  CALCIUM 8.9 9.0 9.2 8.4 8.6 8.6 9.1  MG 1.7 1.9  --   --   --   --   --   PHOS 2.6 2.9  --   --   --   --   --    CBC:  Recent Labs Lab 06/05/12 0355 06/06/12 0517 06/07/12 0405 06/08/12 0535 06/11/12 0450  WBC 13.1* 11.3* 8.9 7.9 9.2  HGB 11.3* 11.0* 11.2* 11.7* 10.8*  HCT 31.6* 30.8* 32.3* 33.5* 31.6*  MCV 95.8 93.3 94.4 95.2 95.8  PLT 243 242 272 301 309   Cardiac Enzymes:  Recent Labs Lab 06/05/12 0650 06/05/12 1140 06/05/12 1709  TROPONINI 0.35* 0.33* <0.30   Studies:  Recent x-ray studies have been reviewed in detail by the Attending Physician  Scheduled Meds:  Scheduled Meds: . diltiazem  30 mg Oral Q6H  . feeding supplement  237 mL Oral TID WC  . pantoprazole sodium  40 mg Oral BID AC  . rivaroxaban  20 mg Oral Q supper  . sodium chloride  3 mL Intravenous Q12H   Continuous Infusions: . sodium chloride 10 mL/hr (06/10/12 1751)    Time spent on care of this patient: 35 minutes  Junious Silk, ANP [facility] Office  [phone] Pager [phone]  On-Call/Text Page:      [email or web address]      password [email or web address]  If 7PM-7AM, please contact night-coverage [email or web address] Password [facility] 06/11/2012, 12:42 PM   LOS: 12 days     I have examined the patient, reviewed the chart and modified the above  note which I agree with.   Sachi Boulay,MD [phone] 06/11/2012, 6:02 PM

## 2012-06-11 NOTE — Progress Notes (Signed)
NUTRITION FOLLOW UP  Intervention:   1.  Modify diet; liberalization per MD discretion.  Question whether pt ready for trial of soft solid such as mashed potatoes, bananas, sweet potatoes, canned fruit. 2.  Supplements; d/c Breeze, order Ensure Complete TID with meals.  -  RD will continue to follow    Nutrition Dx:   Inadequate oral intake related to pain and decreased appetite 2/2 gastropariesis as evidenced by 13% weight loss in 6 weeks. Ongoing   Monitoring:   1.  Food/Beverage; diet advancement once medically appropriate. Continue supplements to support intake.   Assessment:   Pt admitted with coffee-ground emesis and abdominal distention, found to have SBO.    Pt was NPO/CL x7 days on admission as he was thought to have ischemic colitis with ileus per endoscopy/colonoscopy. Pt previously had a NG tube for suction, but since pt pulled it out. Biopsy (3/19)revealed stomach cancer metastatic to the colon and peritoneum. This cancer is treatable but not curable. New dx of Stage IV stomach cancer.  Pt followed by PMT, family remains in decision-making phase and are planning to discuss dx and treatment options with oncology once better understood.  Planning to meet with Dr. Truett Perna today.   Pt seen by SLP (3/21) who determined pt appropriate for thin liquids.  Intake currently limited by diet tolerance and distention, although intake is 50-100% of some meals. Refeeding labs wnl.   RD met with pt and wife who report anticipation of diet advancement.  Pt dislikes Cleotis Nipper- will change order. Willing to try Ensure and Magic cups.   Height: Ht Readings from Last 1 Encounters:  06/11/12 6\' 1"  (1.854 m)    Weight Status:   Wt Readings from Last 1 Encounters:  06/11/12 190 lb 7.6 oz (86.4 kg)  05/30/12 187 lbs   Re-estimated needs:  Kcal: 1950- 2150 kcal  Protein: 105 0 120 gm  Fluid: >/= 2 L   Skin: intact  Diet Order: Full Liquid   Intake/Output Summary (Last 24 hours) at  06/11/12 1040 Last data filed at 06/11/12 0907  Gross per 24 hour  Intake   1053 ml  Output   1451 ml  Net   -398 ml    Last BM: 06/10/2012   Labs:   Recent Labs Lab 06/06/12 0517 06/07/12 0405  06/08/12 0535 06/09/12 0555 06/10/12 0544  NA 141 140  < > 138 141 138  K 3.0* 2.7*  < > 3.5 3.8 4.6  CL 104 100  < > 102 103 102  CO2 26 29  < > 26 29 28   BUN 16 16  < > 22 22 20   CREATININE 0.80 0.89  < > 1.00 1.07 0.97  CALCIUM 8.9 9.0  < > 8.6 8.6 9.1  MG 1.7 1.9  --   --   --   --   PHOS 2.6 2.9  --   --   --   --   GLUCOSE 114* 114*  < > 117* 112* 114*  < > = values in this interval not displayed.  Sodium  Date/Time Value Range Status  06/10/2012  5:44 AM 138  135 - 145 mEq/L Final  06/09/2012  5:55 AM 141  135 - 145 mEq/L Final  06/08/2012  5:35 AM 138  135 - 145 mEq/L Final    Potassium  Date/Time Value Range Status  06/10/2012  5:44 AM 4.6  3.5 - 5.1 mEq/L Final     DELTA CHECK NOTED  SLIGHT HEMOLYSIS  06/09/2012  5:55 AM 3.8  3.5 - 5.1 mEq/L Final  06/08/2012  5:35 AM 3.5  3.5 - 5.1 mEq/L Final    Phosphorus  Date/Time Value Range Status  06/07/2012  4:05 AM 2.9  2.3 - 4.6 mg/dL Final  1/61/0960  4:54 AM 2.6  2.3 - 4.6 mg/dL Final  09/81/1914  7:82 PM 3.0  2.3 - 4.6 mg/dL Final    Magnesium  Date/Time Value Range Status  06/07/2012  4:05 AM 1.9  1.5 - 2.5 mg/dL Final  9/56/2130  8:65 AM 1.7  1.5 - 2.5 mg/dL Final  7/84/6962  9:52 AM 1.7  1.5 - 2.5 mg/dL Final      CBG (last 3)  No results found for this basename: GLUCAP,  in the last 72 hours  Scheduled Meds: . diltiazem  30 mg Oral Q6H  . feeding supplement  1 Container Oral TID BM  . pantoprazole sodium  40 mg Oral BID AC  . rivaroxaban  20 mg Oral Q supper  . sodium chloride  3 mL Intravenous Q12H    Continuous Infusions: . sodium chloride 10 mL/hr (06/10/12 1751)    Loyce Dys, MS RD LDN Clinical Inpatient Dietitian Pager: [phone] Weekend/After hours pager: [phone]

## 2012-06-11 NOTE — Progress Notes (Signed)
I had a long discussion with the patient and his wife.  I feel the best choice may be PET scan tomorrow (they are willing to pay out of pocket if needed) and laparoscopic exploration, ascites sampling, and diverting loop ileostomy Thursday.  We need to further clarify the diagnosis.Will await Dr. Kalman Drape input. Patient examined and I agree with the assessment and plan  Violeta Gelinas, MD, MPH, FACS Pager: [phone]  06/11/2012 12:02 PM

## 2012-06-11 NOTE — Progress Notes (Signed)
This NP F/U with patient and wife at bedside.  Questions and concerns addressed.  No interventions at this time. PMT will continue to support holistically.  Lorinda Creed NP  Palliative Medicine Team Team Phone # [phone] Pager [phone]

## 2012-06-11 NOTE — Progress Notes (Signed)
Patient ID: [name] [name], male   DOB: 09-16-1930, 79 y.o.   MRN: [id number] 8 Days Post-Op  Subjective: Pt feels ok.  Minimal appetite.  Doesn't like boost.  Wondering if foley can come out.  Objective: Vital signs in last 24 hours: Temp:  [97.8 F (36.6 C)-98.9 F (37.2 C)] 97.9 F (36.6 C) (03/25 0400) Pulse Rate:  [66-72] 66 (03/25 0400) Resp:  [16-26] 16 (03/25 0400) BP: (102-118)/(68-75) 118/73 mmHg (03/25 0400) SpO2:  [84 %-98 %] 98 % (03/25 0400) Weight:  [190 lb 7.6 oz (86.4 kg)] 190 lb 7.6 oz (86.4 kg) (03/25 0400) Last BM Date: 06/10/12  Intake/Output from previous day: 03/24 0701 - 03/25 0700 In: 1280 [P.O.:1037; I.V.:243] Out: 1301 [Urine:1300; Stool:1] Intake/Output this shift:    PE: Abd: soft, more distended today, +BS, minimally tender Heart: regular Lungs: CTAB  Lab Results:   Recent Labs  06/11/12 0450  WBC 9.2  HGB 10.8*  HCT 31.6*  PLT 309   BMET  Recent Labs  06/09/12 0555 06/10/12 0544  NA 141 138  K 3.8 4.6  CL 103 102  CO2 29 28  GLUCOSE 112* 114*  BUN 22 20  CREATININE 1.07 0.97  CALCIUM 8.6 9.1   PT/INR No results found for this basename: LABPROT, INR,  in the last 72 hours CMP     Component Value Date/Time   NA 138 06/10/2012 0544   K 4.6 06/10/2012 0544   CL 102 06/10/2012 0544   CO2 28 06/10/2012 0544   GLUCOSE 114* 06/10/2012 0544   BUN 20 06/10/2012 0544   CREATININE 0.97 06/10/2012 0544   CALCIUM 9.1 06/10/2012 0544   PROT 5.0* 06/09/2012 0555   ALBUMIN 2.2* 06/09/2012 0555   AST 27 06/09/2012 0555   ALT 18 06/09/2012 0555   ALKPHOS 144* 06/09/2012 0555   BILITOT 0.4 06/09/2012 0555   GFRNONAA 76* 06/10/2012 0544   GFRAA 89* 06/10/2012 0544   Lipase  No results found for this basename: lipase       Studies/Results: No results found.  Anti-infectives: Anti-infectives   Start     Dose/Rate Route Frequency Ordered Stop   05/30/12 2200  ciprofloxacin (CIPRO) IVPB 400 mg  Status:  Discontinued     400 mg 200  mL/hr over 60 Minutes Intravenous Every 12 hours 05/30/12 2123 06/04/12 0746   05/30/12 2200  metroNIDAZOLE (FLAGYL) IVPB 500 mg  Status:  Discontinued     500 mg 100 mL/hr over 60 Minutes Intravenous Every 8 hours 05/30/12 2123 06/04/12 0746       Assessment/Plan  1. Adenocarcinoma, likely gastric primary with stage 4 disease 2. Possible PBO in colon   Plan: 1. Patient more distended today, but not nauseated and still passing some gas and had a BM yesterday. 2. Will treat clinically for now.  Try some solid food.  If he doesn't tolerate this then we will need to discuss further plans. 3. Dr. Truett Perna to see today 4. Patient would like foley out if there are no contraindications.   LOS: 12 days    Jason Hauge E 06/11/2012, 8:02 AM Pager: [phone]

## 2012-06-11 NOTE — Progress Notes (Signed)
    SUBJECTIVE: No chest pain or SOB.   Telemetry: NSR  BP 119/68  Pulse 78  Temp(Src) 97.4 F (36.3 C) (Oral)  Resp 23  Ht 6\' 1"  (1.854 m)  Wt 190 lb 7.6 oz (86.4 kg)  BMI 25.14 kg/m2  SpO2 95%  Intake/Output Summary (Last 24 hours) at 06/11/12 1318 Last data filed at 06/11/12 4540  Gross per 24 hour  Intake    786 ml  Output   1250 ml  Net   -464 ml    PHYSICAL EXAM General: Well developed, well nourished, in no acute distress. Alert and oriented x 3.  Psych:  Good affect, responds appropriately Neck: No JVD. No masses noted.  Lungs: Clear bilaterally with no wheezes or rhonci noted.  Heart: RRR with no murmurs noted. Abdomen: Bowel sounds are present. Soft, non-tender.  Extremities: No lower extremity edema.   LABS: Basic Metabolic Panel:  Recent Labs  98/11/91 0555 06/10/12 0544  NA 141 138  K 3.8 4.6  CL 103 102  CO2 29 28  GLUCOSE 112* 114*  BUN 22 20  CREATININE 1.07 0.97  CALCIUM 8.6 9.1   CBC:  Recent Labs  06/11/12 0450  WBC 9.2  HGB 10.8*  HCT 31.6*  MCV 95.8  PLT 309   Current Meds: . diltiazem  30 mg Oral Q6H  . feeding supplement  237 mL Oral TID WC  . pantoprazole sodium  40 mg Oral BID AC  . rivaroxaban  20 mg Oral Q supper  . sodium chloride  3 mL Intravenous Q12H     ASSESSMENT AND PLAN: Mr. Hetland is a 79 year old male with history of atrial fibrillation, previously on pradaxa for long term anti-coagulation with recurrent CVAs admitted with GI bleeding. Pradaxa held on admission for GI bleed. Maintaining NSR with some ectopy. Currently awaiting oncology evaluation for new diagnosis of stage 4 adenocarcinoma of the stomach.   1. Atrial fibrillation, paroxysmal: Currently in NSR. He has been switched to po cardizem. He has been started on Xarelto for anti-coagulation. This will need to be held if surgery is planned.   2. Chronic diastolic CHF: Volume ok. I/O matched. Systolic function normal on echo 3/18. No significant  valvular disease.   3. CVA: Per primary team. On Xarelto for anti-coagulation.   4. 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     MCALHANY,CHRISTOPHER  3/25/20141:18 PM

## 2012-06-12 DIAGNOSIS — R109 Unspecified abdominal pain: Secondary | ICD-10-CM

## 2012-06-12 DIAGNOSIS — C184 Malignant neoplasm of transverse colon: Secondary | ICD-10-CM

## 2012-06-12 DIAGNOSIS — C801 Malignant (primary) neoplasm, unspecified: Secondary | ICD-10-CM

## 2012-06-12 LAB — TYPE AND SCREEN

## 2012-06-12 MED ORDER — SODIUM CHLORIDE 0.9 % IV SOLN
INTRAVENOUS | Status: DC
  Administered 2012-06-12 – 2012-06-17 (×4): via INTRAVENOUS

## 2012-06-12 MED ORDER — METRONIDAZOLE IN NACL 5-0.79 MG/ML-% IV SOLN
500.0000 mg | Freq: Once | INTRAVENOUS | Status: AC
  Administered 2012-06-12: 500 mg via INTRAVENOUS
  Filled 2012-06-12: qty 100

## 2012-06-12 MED ORDER — CIPROFLOXACIN IN D5W 400 MG/200ML IV SOLN
400.0000 mg | Freq: Once | INTRAVENOUS | Status: AC
  Administered 2012-06-12: 400 mg via INTRAVENOUS
  Filled 2012-06-12: qty 200

## 2012-06-12 MED ORDER — DILTIAZEM HCL ER COATED BEADS 120 MG PO CP24
120.0000 mg | ORAL_CAPSULE | Freq: Every day | ORAL | Status: DC
  Administered 2012-06-12 – 2012-06-18 (×6): 120 mg via ORAL
  Filled 2012-06-12 (×7): qty 1

## 2012-06-12 MED ORDER — PANTOPRAZOLE SODIUM 40 MG PO TBEC
40.0000 mg | DELAYED_RELEASE_TABLET | Freq: Two times a day (BID) | ORAL | Status: DC
  Administered 2012-06-12 – 2012-06-18 (×11): 40 mg via ORAL
  Filled 2012-06-12 (×9): qty 1

## 2012-06-12 NOTE — Progress Notes (Signed)
Patient ID: [name] [name], male   DOB: 09-Jan-1931, 79 y.o.   MRN: [id number] 9 Days Post-Op  Subjective: Pt feels ok today.  Still with some distention, but thinks it's a little better.  No nausea.  Objective: Vital signs in last 24 hours: Temp:  [97.2 F (36.2 C)-98.5 F (36.9 C)] 97.3 F (36.3 C) (03/26 0400) Pulse Rate:  [68-78] 71 (03/26 0400) Resp:  [19-24] 19 (03/26 0400) BP: (92-119)/(61-75) 114/73 mmHg (03/26 0400) SpO2:  [91 %-96 %] 93 % (03/26 0400) Weight:  [195 lb 15.8 oz (88.9 kg)] 195 lb 15.8 oz (88.9 kg) (03/26 0400) Last BM Date: 06/10/12  Intake/Output from previous day: 03/25 0701 - 03/26 0700 In: 113 [I.V.:113] Out: 1650 [Urine:1650] Intake/Output this shift:    PE: Abd: soft, still distended, but slightly less than yesterday, +BS, NT Heart: NSR  Lab Results:   Recent Labs  06/11/12 0450  WBC 9.2  HGB 10.8*  HCT 31.6*  PLT 309   BMET  Recent Labs  06/10/12 0544  NA 138  K 4.6  CL 102  CO2 28  GLUCOSE 114*  BUN 20  CREATININE 0.97  CALCIUM 9.1   PT/INR No results found for this basename: LABPROT, INR,  in the last 72 hours CMP     Component Value Date/Time   NA 138 06/10/2012 0544   K 4.6 06/10/2012 0544   CL 102 06/10/2012 0544   CO2 28 06/10/2012 0544   GLUCOSE 114* 06/10/2012 0544   BUN 20 06/10/2012 0544   CREATININE 0.97 06/10/2012 0544   CALCIUM 9.1 06/10/2012 0544   PROT 5.0* 06/09/2012 0555   ALBUMIN 2.2* 06/09/2012 0555   AST 27 06/09/2012 0555   ALT 18 06/09/2012 0555   ALKPHOS 144* 06/09/2012 0555   BILITOT 0.4 06/09/2012 0555   GFRNONAA 76* 06/10/2012 0544   GFRAA 89* 06/10/2012 0544   Lipase  No results found for this basename: lipase       Studies/Results: No results found.  Anti-infectives: Anti-infectives   Start     Dose/Rate Route Frequency Ordered Stop   05/30/12 2200  ciprofloxacin (CIPRO) IVPB 400 mg  Status:  Discontinued     400 mg 200 mL/hr over 60 Minutes Intravenous Every 12 hours 05/30/12 2123  06/04/12 0746   05/30/12 2200  metroNIDAZOLE (FLAGYL) IVPB 500 mg  Status:  Discontinued     500 mg 100 mL/hr over 60 Minutes Intravenous Every 8 hours 05/30/12 2123 06/04/12 0746       Assessment/Plan  1. Adenocarcinoma, likely gastric primary with stage 4 disease 2. Partial colonic obstruction secondary to stage 4 disease 3. H/o a fib with CVA 4. Possible CVA this admit 5. Deconditioning  Plan: 1. D/W Dr. Clifton James, will dc xarelto so we can proceed with surgical intervention tomorrow to perform a loop ileostomy to relieve this partial colonic obstruction.  I have discussed this thoroughly with the patient and his wife.  They are agreeable to this plan.  2. Dr. Truett Perna has been by today and will revisit around 8:30.  From what the patient and wife state, he is agreeing with proceeding with surgery and then PET as an outpatient or sometime after surgery is completed. 3. NPO p MN for OR tomorrow.   LOS: 13 days    Artemio Dobie E 06/12/2012, 8:19 AM Pager: [phone]

## 2012-06-12 NOTE — Progress Notes (Signed)
Informed by CMT that patient had a short run of A flutter but converted back to SR will continue to monitor.

## 2012-06-12 NOTE — Progress Notes (Signed)
Noted plan is for surgery tomorrow. Patient not a candidate for CIR at this time. Please contact CIR Admission Coordinator for re-evaluation when procedures and workup completed.  For questions call [phone]

## 2012-06-12 NOTE — Progress Notes (Signed)
D/W Dr. Truett Perna at length. He feels PET is unnecessary now and that surgery will yield the information we need.  In addition, noted palpable L scalene LN.  Will plan exploratory laparoscopy, laparoscopic loop ileostomy, and if no obvious carcinomatosis, L scalene LN BX tomorrow PM so Xarelto can wear off.  Procudure, risks, benefits D/W patient and wife. He agrees. Patient examined and I agree with the assessment and plan  Violeta Gelinas, MD, MPH, FACS Pager: [phone]  06/12/2012 10:11 AM

## 2012-06-12 NOTE — Progress Notes (Signed)
IP PROGRESS NOTE  Subjective:   I was asked to see him by Dr. Darnelle Catalan.  He continues to feel "weak ". [name] [name] and his wife report he has an approximate one year history of intermittent abdominal pain. He reports constipation and diarrhea prior to hospital admission. He was admitted on 05/30/2012 with a syncope event and "coffee ground "emesis. He has lost a significant amount of weight.  He was referred to Dr. Jeannetta Ellis and underwent upper and lower endoscopy procedures on 06/03/2012. Edematous mucosa was noted in the stomach and in the mid transverse colon. The scope could not be advanced beyond the abnormal area in the transverse colon. Biopsies from the stomach and colon revealed malignant cells consistent with "diffuse type "adenocarcinoma. The colon biopsy was suggestive of a metastatic lesion.  [name] [name] has been evaluated by Dr. Janee Morn and Dr. Darnelle Catalan. It is not clear whether he has distant metastatic disease. He is scheduled for a diagnostic laparoscopy and palliative distal loop ileostomy on 06/13/2012.   Objective: Vital signs in last 24 hours: Blood pressure 97/62, pulse 80, temperature 98.7 F (37.1 C), temperature source Oral, resp. rate 31, height 6\' 1"  (1.854 m), weight 195 lb 15.8 oz (88.9 kg), SpO2 93.00%.  Intake/Output from previous day: 03/25 0701 - 03/26 0700 In: 133 [I.V.:133] Out: 1650 [Urine:1650]  Physical Exam:  HEENT: Neck without mass Lungs: Decreased breath sounds at the bases, no respiratory distress Cardiac: Regular rate and rhythm Abdomen: No hepatosplenomegaly, no mass, nontender, no apparent ascites Extremities: No leg edema Lymph nodes: There are 2 mobile left scalene nodes measuring approximately 1 cm. "Shotty "right axillary node, no other palpable cervical, supraclavicular, axillary, or inguinal nodes   Lab Results:  Recent Labs  06/11/12 0450  WBC 9.2  HGB 10.8*  HCT 31.6*  PLT 309    BMET  Recent Labs  06/10/12 0544  NA  138  K 4.6  CL 102  CO2 28  GLUCOSE 114*  BUN 20  CREATININE 0.97  CALCIUM 9.1    Medications: I have reviewed the patient's current medications.   Impression:  1. Adenocarcinoma involving biopsies of the stomach and transverse colon-likely primary gastric cancer with direct extension to the transverse colon  2. Partial colonic obstruction secondary to #1  3. History of atrial fibrillation-now maintained on xarelto  4. Ascites on the CT scan 05/30/2012-most likely malignant  5. Left neck lymph nodes-? Malignant     He most likely has metastatic adenocarcinoma of the stomach. He is scheduled for a palliative loop ileostomy on 06/13/2012. I discussed the case with Dr. Janee Morn and he feels this procedure is indicated regardless of the cancer staging. Dr. Janee Morn will evaluate the abdominal cavity with laparoscopy and peritoneal fluid cytology. He plans to biopsy one of the left scalene nodes if examination of the abdominal cavity reveals no evidence of metastatic disease.  We will discuss treatment options after the staging surgery. I will see [name] [name] again on 06/13/2012. We will schedule outpatient followup.     LOS: 13 days   Eartha Vonbehren  06/12/2012, 12:21 PM

## 2012-06-12 NOTE — Progress Notes (Signed)
Physical Therapy Treatment Patient Details Name: [name] [name] MRN: [id number] DOB: 11/15/1930 Today's Date: 06/12/2012 Time: 1050-1120 PT Time Calculation (min): 30 min  PT Assessment / Plan / Recommendation Comments on Treatment Session  Progressed to out of room ambulation today.  Seems still limited due to pressure drop with upright positioning.  Recommed seated in chair or edge of bed for all meals to increase time upright.  Will continue to benefit from acute level therapies and progress to CIR once stable medically for intensive rehab.    Follow Up Recommendations  CIR     Does the patient have the potential to tolerate intense rehabilitation   yes  Barriers to Discharge  none      Equipment Recommendations  Rolling walker with 5" wheels    Recommendations for Other Services  none  Frequency Min 3X/week   Plan Discharge plan remains appropriate    Precautions / Restrictions Precautions Precautions: Fall Precaution Comments: watch for orthostasis Restrictions Weight Bearing Restrictions: No   Pertinent Vitals/Pain Denies pain; BP 111/74 seated, 97/62 after ambulation   Mobility  Bed Mobility Supine to Sit: 3: Mod assist Details for Bed Mobility Assistance: assisted to lift trunk with supine to sit Transfers Sit to Stand: 4: Min assist;From bed;From chair/3-in-1;With upper extremity assist Stand to Sit: 4: Min assist;To chair/3-in-1 Details for Transfer Assistance: cues for hand placement and technique Ambulation/Gait Ambulation/Gait Assistance: 4: Min assist;5: Supervision Ambulation Distance (Feet): 45 Feet (x 2) Assistive device: Rolling walker Ambulation/Gait Assistance Details: cues for upright posture, for increased distance with ambulation Gait Pattern: Step-through pattern;Trunk flexed;Decreased stride length;Shuffle    Exercises General Exercises - Upper Extremity Shoulder Flexion: Strengthening;Both;10 reps (holding small soda can) Elbow Flexion:  Strengthening;Both;10 reps (holding small soda can) General Exercises - Lower Extremity Ankle Circles/Pumps: AROM;Both;20 reps;Supine Long Arc Quad: AROM;Both;Seated;10 reps Heel Slides: AROM;Both;Supine Hip Flexion/Marching: AROM;Both;15 reps;Seated    PT Goals Acute Rehab PT Goals Pt will go Supine/Side to Sit: with modified independence PT Goal: Supine/Side to Sit - Progress: Progressing toward goal Pt will go Sit to Stand: with supervision PT Goal: Sit to Stand - Progress: Progressing toward goal Pt will go Stand to Sit: with supervision PT Goal: Stand to Sit - Progress: Progressing toward goal Pt will Stand: with min assist;3 - 5 min;with unilateral upper extremity support PT Goal: Stand - Progress: Progressing toward goal Pt will Ambulate: with min assist;with least restrictive assistive device;with supervision;>150 feet PT Goal: Ambulate - Progress: Updated due to goal met  Visit Information  Last PT Received On: 06/12/12    Subjective Data  Subjective: They said they may go ahead and take my stomach.  What does that mean?   Cognition  Cognition Overall Cognitive Status: Appears within functional limits for tasks assessed/performed Arousal/Alertness: Awake/alert Orientation Level: Appears intact for tasks assessed Behavior During Session: [facility] for tasks performed    Balance  Static Sitting Balance Static Sitting - Balance Support: Bilateral upper extremity supported Static Sitting - Level of Assistance: 5: Stand by assistance Static Sitting - Comment/# of Minutes: standing about 10 seconds prior to ambulating  End of Session PT - End of Session Equipment Utilized During Treatment: Gait belt;Oxygen Activity Tolerance: Patient limited by fatigue Patient left: in chair   GP     [facility] 06/12/2012, 12:05 PM Sheran Lawless, PT [phone] 06/12/2012

## 2012-06-12 NOTE — Progress Notes (Signed)
[facility] Progress Note [place] TEAM 1 - Stepdown/ICU TEAM   [name] [name]:[id number] DOB: Oct 24, 1930 DOA: 05/30/2012 PCP: Miguel Aschoff, MD  Brief narrative: 79 year old male with multiple medical problems including chronic atrial fibrillation on pradaxa, hx of stroke, hypertension, and gastroparesis who presented to ED with complaints of abdominal distention with associated coffee-ground emesis. The symptoms started 2 days prior with abdominal pain diffusely with cramping sensation, rated 7-8/10. Subsequently he noticed that his abdomen was getting distended. This was associated with intermittent diarrhea over a month. He had been evaluated by his gastroenterologist (Dr. Madilyn Fireman) who advised lactose-free diet but unfortunately he saw no improvement in the diarrhea. On the morning of admission he reported feeling dizzy and lightheaded and had an episode of diarrhea. He had a syncopal episode.  Patient denied hitting his head and was witnessed by his wife. Per his wife, he was out for only a few seconds. When patient presented to the ED, he was noted to be in rapid A. Fib, HR in 130's, hypotensive BP in 80's, he was placed on IV fluids. He had 2 episodes of coffee-ground emesis in the ED. FOBT was positive in ED. A CT scan of the abdomen was obtained due to the abdominal distention and coffee-ground emesis which showed moderate ascites with diffuse fluid filled and dilated small bowel likely mechanical small bowel obstruction with potentail ischemic/infectious and inflammatory etiologies.   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   -colonoscopy/EGD unremarkable on direct visualization but unfortunately path returned positive for AdenoCA -Appreciate Onco assistance - felt to be stage 4 and given multiple co-morbidities suggestion was for Palliative Care consult - option of palliative chemo as OP -CCS plans laparoscopic exploration, ascites sampling and diverting loop ileostomy 3/27 as family wishes to investigate active tx options -PET scan as OP -Appreciate Palliative assistance  Hematemesis / gastric CA -pt w/ significant anorexia and early satiety- dislikes the sweetness of the protein supplements/nutrition assisting w/GI/surgery (pt request) -no evidence of bleeding on Xarelto (chosen for decreased risk of GIB) at this time   Acute delirium / TIA vs CVA / acute dysarthria -Resolved -CT Head negative - consulted Neuro -MRI initially delayed due to hypotension, and would likely add little to his current care therefore not to be pursued -had apparent admit for CVA Jan 2014 in [facility] and was also admitted to [facility] Jan 2014 for expressive aphasia  Diarrhea  -resolved in absence of significant intake   Rapid atrial fibrillation/flutter -Cardizem gtt stopped due to hypotension - BP more stable and tolerating oral CCB -NSR on monitor at this time  -keep K+ >4.0  Hypokalemia -resolved -Was 2/2 high output diarrhea  Elevated cardiac enzymes -Likely due to rapid heart rate/ demand ischemia - normalized as of 3/19  Hx of HYPERTENSION, BENIGN SYSTEMIC not an active issue at this time   Code Status: Full code Family Communication: discussed with pt Disposition Plan: SDU  DVT prophylaxis: Xarelto-on hold 24 hours pre-op so SCD's  Consultants: Gastroenterology General Surgery Cardiology Neurology Oncology  Procedures: EGD (06/03/12)  1. Mucosal edema and slight nonerosive gastric erythema. Findings are nonspecific.  2.? Obstructive sleep apnea based on tolerance of procedure as described  above   RECOMMENDATIONS:  1. Await pathology results  2. Proceed to colonoscopic evaluation   Colonoscopy (/06/03/12) 1. Significant mucosal edema and spasm in the mid transverse colon. The etiology is not clear, but I wonder about ischemia. Efforts to advance the scope more proximally were unsuccessful within the constraints of the exam in terms of patient sedation tolerance, see above discussion.  RECOMMENDATIONS:  1. Await pathology results. Supportive care in the meantime.  Antibiotics: Ciprofloxacin 3/13>>3/18 Flagyl 3/13>>3/18  HPI/Subjective: Alert and pleasant. Eager to proceed with surgery. No CP/SOB.  Objective: Blood pressure 111/72, pulse 69, temperature 98 F (36.7 C), temperature source Oral, resp. rate 23, height 6\' 1"  (1.854 m), weight 88.9 kg (195 lb 15.8 oz), SpO2 95.00%.  Intake/Output Summary (Last 24 hours) at 06/12/12 1352 Last data filed at 06/12/12 1200  Gross per 24 hour  Intake    130 ml  Output   3025 ml  Net  -2895 ml   Exam: General: Awake alert oriented x3, no acute respiratory distress Lungs: Clear to auscultation bilaterally without wheezes or crackles Cardiovascular: Regular rate and normal sinus rhythm without murmur gallop or rub Abdomen: Nontender, mildly distended, soft Musculoskeletal: No significant cyanosis, clubbing of bilateral lower extremities   Data Reviewed: Basic Metabolic Panel:  Recent Labs Lab 06/06/12 0517 06/07/12 0405 06/07/12 1217 06/07/12 1931 06/08/12 0535 06/09/12 0555 06/10/12 0544  NA 141 140 138 137 138 141 138  K 3.0* 2.7* 3.1* 3.4* 3.5 3.8 4.6  CL 104 100 97 101 102 103 102  CO2 26 29 30 29 26 29 28   GLUCOSE 114* 114* 122* 145* 117* 112* 114*  BUN 16 16 16 20 22 22 20   CREATININE 0.80 0.89 1.02 1.03 1.00 1.07 0.97  CALCIUM 8.9 9.0 9.2 8.4 8.6 8.6 9.1  MG 1.7 1.9  --   --   --   --   --   PHOS 2.6 2.9  --   --   --   --   --    CBC:  Recent Labs Lab 06/06/12 0517 06/07/12 0405 06/08/12 0535  06/11/12 0450  WBC 11.3* 8.9 7.9 9.2  HGB 11.0* 11.2* 11.7* 10.8*  HCT 30.8* 32.3* 33.5* 31.6*  MCV 93.3 94.4 95.2 95.8  PLT 242 272 301 309   Cardiac Enzymes:  Recent Labs Lab 06/05/12 1709  TROPONINI <0.30   Studies:  Recent x-ray studies have been reviewed in detail by the Attending Physician  Scheduled Meds:  Scheduled Meds: . [START ON 06/13/2012] ciprofloxacin  400 mg Intravenous Once  . diltiazem  120 mg Oral Daily  . feeding supplement  237 mL Oral TID WC  . [START ON 06/13/2012] metronidazole  500 mg Intravenous Once  . pantoprazole  40 mg Oral BID AC  . sodium chloride  3 mL Intravenous Q12H   Continuous Infusions: . sodium chloride 10 mL/hr (06/10/12 1751)    Time spent on care of this patient: 25 minutes  Junious Silk, ANP [facility] Office  [phone] Pager [phone]  On-Call/Text Page:      [email or web address]      password [email or web address]  If 7PM-7AM, please contact night-coverage [email or web address] Password [email or web address] 06/12/2012, 1:52 PM   LOS: 13 days    I have personally examined this patient and reviewed the entire database. I have reviewed the above note, made any necessary editorial changes, and agree with its content.  Lonia Blood, MD [facility]

## 2012-06-12 NOTE — Progress Notes (Signed)
OT Cancellation Note  Patient Details Name: [name] [name] MRN: [id number] DOB: 10/17/1930   Cancelled Treatment:    Reason Eval/Treat Not Completed: Fatigue/lethargy limiting ability to participate. Pt requesting to participate in OT session at later time.   06/12/2012 [name] OTR/L Pager [phone] Office [phone]

## 2012-06-12 NOTE — Progress Notes (Signed)
    SUBJECTIVE: No chest pain or SOB.   Tele: NSR  BP 114/73  Pulse 71  Temp(Src) 97.3 F (36.3 C) (Axillary)  Resp 19  Ht 6\' 1"  (1.854 m)  Wt 195 lb 15.8 oz (88.9 kg)  BMI 25.86 kg/m2  SpO2 93%  Intake/Output Summary (Last 24 hours) at 06/12/12 0741 Last data filed at 06/12/12 0500  Gross per 24 hour  Intake    113 ml  Output   1650 ml  Net  -1537 ml    PHYSICAL EXAM General: Well developed, well nourished, in no acute distress. Alert and oriented x 3.  Psych:  Good affect, responds appropriately Neck: No JVD. No masses noted.  Lungs: Clear bilaterally with no wheezes or rhonci noted.  Heart: RRR with no murmurs noted. Abdomen: Bowel sounds are present. Soft, non-tender.  Extremities: No lower extremity edema.   LABS: Basic Metabolic Panel:  Recent Labs  16/10/96 0544  NA 138  K 4.6  CL 102  CO2 28  GLUCOSE 114*  BUN 20  CREATININE 0.97  CALCIUM 9.1   CBC:  Recent Labs  06/11/12 0450  WBC 9.2  HGB 10.8*  HCT 31.6*  MCV 95.8  PLT 309   Current Meds: . diltiazem  30 mg Oral Q6H  . feeding supplement  237 mL Oral TID WC  . pantoprazole sodium  40 mg Oral BID AC  . rivaroxaban  20 mg Oral Q supper  . sodium chloride  3 mL Intravenous Q12H     ASSESSMENT AND PLAN: [name] [name] is a 79 year old male with history of atrial fibrillation, previously on pradaxa for long term anti-coagulation with recurrent CVAs admitted with GI bleeding. Pradaxa held on admission for GI bleed. Maintaining NSR with some ectopy. Currently awaiting oncology evaluation for new diagnosis of stage 4 adenocarcinoma of the stomach. He has been restarted on Xarelto for anti-coagulation.   1. Atrial fibrillation, paroxysmal: Currently in NSR. Continue Cardizem po. Will change to long acting Cardizem CD. He has been started on Xarelto for anti-coagulation. I will stop this today after discussions with surgery this am. He could go to the OR tomorrow afternoon but it may be best to wait  until Friday since he received Xarelto on Tuesday.    2. Chronic diastolic CHF: Volume ok. Systolic function normal on echo 3/18. No significant valvular disease.   3. CVA: Per primary team. On Xarelto for anti-coagulation. Xarelto will be stopped today in anticipation of surgery.   4. 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   [name] [name]  3/26/20147:41 AM

## 2012-06-13 ENCOUNTER — Encounter (HOSPITAL_COMMUNITY)
Admission: EM | Disposition: A | Discharge status: Rehabilitation Facility | Payer: Self-pay | Source: Home / Self Care | Attending: Internal Medicine

## 2012-06-13 ENCOUNTER — Encounter (HOSPITAL_COMMUNITY): Payer: Self-pay | Admitting: Certified Registered"

## 2012-06-13 ENCOUNTER — Inpatient Hospital Stay (HOSPITAL_COMMUNITY): Payer: Medicare Other | Admitting: Certified Registered"

## 2012-06-13 DIAGNOSIS — C770 Secondary and unspecified malignant neoplasm of lymph nodes of head, face and neck: Secondary | ICD-10-CM

## 2012-06-13 HISTORY — PX: LAPAROSCOPY: SHX197

## 2012-06-13 HISTORY — PX: LYMPH NODE BIOPSY: SHX201

## 2012-06-13 HISTORY — PX: DIVERTING ILEOSTOMY: SHX5799

## 2012-06-13 LAB — SURGICAL PCR SCREEN
MRSA, PCR: NEGATIVE
Staphylococcus aureus: NEGATIVE

## 2012-06-13 SURGERY — LAPAROSCOPY, DIAGNOSTIC
Anesthesia: General | Site: Neck | Wound class: Clean

## 2012-06-13 MED ORDER — NEOSTIGMINE METHYLSULFATE 1 MG/ML IJ SOLN
INTRAMUSCULAR | Status: DC | PRN
  Administered 2012-06-13: 3 mg via INTRAVENOUS

## 2012-06-13 MED ORDER — GLYCOPYRROLATE 0.2 MG/ML IJ SOLN
INTRAMUSCULAR | Status: DC | PRN
  Administered 2012-06-13: 0.4 mg via INTRAVENOUS

## 2012-06-13 MED ORDER — LACTATED RINGERS IV SOLN
INTRAVENOUS | Status: DC | PRN
  Administered 2012-06-13 (×2): via INTRAVENOUS

## 2012-06-13 MED ORDER — ONDANSETRON HCL 4 MG/2ML IJ SOLN: 4.0000 mg | Freq: Four times a day (QID) | INTRAMUSCULAR | Status: DC | PRN

## 2012-06-13 MED ORDER — PROPOFOL 10 MG/ML IV BOLUS
INTRAVENOUS | Status: DC | PRN
  Administered 2012-06-13: 150 mg via INTRAVENOUS

## 2012-06-13 MED ORDER — HYDROMORPHONE HCL PF 1 MG/ML IJ SOLN
0.2500 mg | INTRAMUSCULAR | Status: DC | PRN
  Administered 2012-06-13: 0.5 mg via INTRAVENOUS
  Administered 2012-06-13: 0.25 mg via INTRAVENOUS

## 2012-06-13 MED ORDER — EPHEDRINE SULFATE 50 MG/ML IJ SOLN
INTRAMUSCULAR | Status: DC | PRN
  Administered 2012-06-13: 10 mg via INTRAVENOUS

## 2012-06-13 MED ORDER — CIPROFLOXACIN IN D5W 400 MG/200ML IV SOLN
INTRAVENOUS | Status: AC
  Administered 2012-06-13: 400 mg via INTRAVENOUS
  Filled 2012-06-13: qty 200

## 2012-06-13 MED ORDER — ROCURONIUM BROMIDE 100 MG/10ML IV SOLN
INTRAVENOUS | Status: DC | PRN
  Administered 2012-06-13 (×2): 20 mg via INTRAVENOUS
  Administered 2012-06-13: 50 mg via INTRAVENOUS

## 2012-06-13 MED ORDER — OXYCODONE HCL 5 MG/5ML PO SOLN
5.0000 mg | Freq: Once | ORAL | Status: DC | PRN
Start: 2012-06-13 — End: 2012-06-13

## 2012-06-13 MED ORDER — MIDAZOLAM HCL 5 MG/5ML IJ SOLN
INTRAMUSCULAR | Status: DC | PRN
  Administered 2012-06-13: 1 mg via INTRAVENOUS

## 2012-06-13 MED ORDER — SODIUM CHLORIDE 0.9 % IR SOLN
Status: DC | PRN
  Administered 2012-06-13: 1000 mL

## 2012-06-13 MED ORDER — BUPIVACAINE HCL (PF) 0.25 % IJ SOLN
INTRAMUSCULAR | Status: DC | PRN
  Administered 2012-06-13: 30 mL

## 2012-06-13 MED ORDER — MORPHINE SULFATE 2 MG/ML IJ SOLN
2.0000 mg | INTRAMUSCULAR | Status: DC | PRN
  Administered 2012-06-13: 2 mg via INTRAVENOUS
  Administered 2012-06-13: 4 mg via INTRAVENOUS
  Administered 2012-06-14: 3 mg via INTRAVENOUS
  Administered 2012-06-14 – 2012-06-17 (×4): 2 mg via INTRAVENOUS
  Filled 2012-06-13 (×4): qty 1
  Filled 2012-06-13 (×2): qty 2
  Filled 2012-06-13: qty 1

## 2012-06-13 MED ORDER — OXYCODONE HCL 5 MG PO TABS: 5.0000 mg | ORAL_TABLET | Freq: Once | ORAL | Status: DC | PRN

## 2012-06-13 MED ORDER — HYDROMORPHONE HCL PF 1 MG/ML IJ SOLN
INTRAMUSCULAR | Status: AC
  Filled 2012-06-13: qty 1

## 2012-06-13 MED ORDER — ONDANSETRON HCL 4 MG/2ML IJ SOLN
INTRAMUSCULAR | Status: DC | PRN
  Administered 2012-06-13: 4 mg via INTRAVENOUS

## 2012-06-13 MED ORDER — PHENYLEPHRINE HCL 10 MG/ML IJ SOLN
INTRAMUSCULAR | Status: DC | PRN
  Administered 2012-06-13: 120 ug via INTRAVENOUS
  Administered 2012-06-13: 80 ug via INTRAVENOUS
  Administered 2012-06-13: 120 ug via INTRAVENOUS
  Administered 2012-06-13 (×3): 80 ug via INTRAVENOUS
  Administered 2012-06-13: 120 ug via INTRAVENOUS

## 2012-06-13 MED ORDER — FENTANYL CITRATE 0.05 MG/ML IJ SOLN
INTRAMUSCULAR | Status: DC | PRN
  Administered 2012-06-13: 100 ug via INTRAVENOUS

## 2012-06-13 MED ORDER — BUPIVACAINE HCL (PF) 0.25 % IJ SOLN
INTRAMUSCULAR | Status: AC
  Filled 2012-06-13: qty 30

## 2012-06-13 MED ORDER — LIDOCAINE HCL (CARDIAC) 20 MG/ML IV SOLN
INTRAVENOUS | Status: DC | PRN
  Administered 2012-06-13: 80 mg via INTRAVENOUS

## 2012-06-13 SURGICAL SUPPLY — 74 items
APPLIER CLIP ROT 10 11.4 M/L (STAPLE)
BLADE SURG 10 STRL SS (BLADE) IMPLANT
BLADE SURG 15 STRL LF DISP TIS (BLADE) ×2 IMPLANT
BLADE SURG 15 STRL SS (BLADE) ×1
BLADE SURG ROTATE 9660 (MISCELLANEOUS) IMPLANT
CANISTER SUCTION 2500CC (MISCELLANEOUS) ×3 IMPLANT
CELLS DAT CNTRL 66122 CELL SVR (MISCELLANEOUS) IMPLANT
CHLORAPREP W/TINT 26ML (MISCELLANEOUS) ×3 IMPLANT
CLIP APPLIE ROT 10 11.4 M/L (STAPLE) IMPLANT
CLOTH BEACON ORANGE TIMEOUT ST (SAFETY) ×3 IMPLANT
CONT SPEC 4OZ CLIKSEAL STRL BL (MISCELLANEOUS) ×3 IMPLANT
COVER MAYO STAND STRL (DRAPES) ×3 IMPLANT
COVER SURGICAL LIGHT HANDLE (MISCELLANEOUS) ×3 IMPLANT
DECANTER SPIKE VIAL GLASS SM (MISCELLANEOUS) ×3 IMPLANT
DRAPE PROXIMA HALF (DRAPES) IMPLANT
DRAPE UTILITY 15X26 W/TAPE STR (DRAPE) ×9 IMPLANT
DRAPE WARM FLUID 44X44 (DRAPE) ×3 IMPLANT
ELECT CAUTERY BLADE 6.4 (BLADE) ×6 IMPLANT
ELECT REM PT RETURN 9FT ADLT (ELECTROSURGICAL) ×3
ELECTRODE REM PT RTRN 9FT ADLT (ELECTROSURGICAL) ×2 IMPLANT
GEL ULTRASOUND 20GR AQUASONIC (MISCELLANEOUS) IMPLANT
GLOVE BIO SURGEON STRL SZ8 (GLOVE) ×6 IMPLANT
GLOVE BIOGEL PI IND STRL 8 (GLOVE) ×4 IMPLANT
GLOVE BIOGEL PI INDICATOR 8 (GLOVE) ×2
GLOVE SURG SS PI 7.0 STRL IVOR (GLOVE) ×6 IMPLANT
GOWN PREVENTION PLUS XLARGE (GOWN DISPOSABLE) ×3 IMPLANT
GOWN STRL NON-REIN LRG LVL3 (GOWN DISPOSABLE) ×3 IMPLANT
KIT BASIN OR (CUSTOM PROCEDURE TRAY) ×3 IMPLANT
KIT OSTOMY DRAINABLE 2.75 STR (WOUND CARE) ×3 IMPLANT
KIT ROOM TURNOVER OR (KITS) ×3 IMPLANT
LEGGING LITHOTOMY PAIR STRL (DRAPES) IMPLANT
LIGASURE IMPACT 36 18CM CVD LR (INSTRUMENTS) IMPLANT
NS IRRIG 1000ML POUR BTL (IV SOLUTION) ×6 IMPLANT
OSTOMY BRIDGE 2 1/2 (MISCELLANEOUS) ×3 IMPLANT
PAD ARMBOARD 7.5X6 YLW CONV (MISCELLANEOUS) ×6 IMPLANT
PAD SHARPS MAGNETIC DISPOSAL (MISCELLANEOUS) ×3 IMPLANT
PENCIL BUTTON HOLSTER BLD 10FT (ELECTRODE) ×3 IMPLANT
RTRCTR WOUND ALEXIS 18CM MED (MISCELLANEOUS)
SCALPEL HARMONIC ACE (MISCELLANEOUS) IMPLANT
SCISSORS LAP 5X35 DISP (ENDOMECHANICALS) IMPLANT
SET IRRIG TUBING LAPAROSCOPIC (IRRIGATION / IRRIGATOR) ×3 IMPLANT
SLEEVE ENDOPATH XCEL 5M (ENDOMECHANICALS) ×3 IMPLANT
SPECIMEN JAR X LARGE (MISCELLANEOUS) IMPLANT
SPONGE LAP 18X18 X RAY DECT (DISPOSABLE) ×3 IMPLANT
STAPLER VISISTAT 35W (STAPLE) ×3 IMPLANT
SURGILUBE 2OZ TUBE FLIPTOP (MISCELLANEOUS) IMPLANT
SUT ETHILON 3 0 FSL (SUTURE) ×3 IMPLANT
SUT MNCRL AB 4-0 PS2 18 (SUTURE) ×6 IMPLANT
SUT PDS II 0 TP-1 LOOPED 60 (SUTURE) ×6 IMPLANT
SUT PROLENE 2 0 CT2 30 (SUTURE) IMPLANT
SUT PROLENE 2 0 KS (SUTURE) IMPLANT
SUT SILK 2 0 (SUTURE) ×1
SUT SILK 2 0 SH CR/8 (SUTURE) ×3 IMPLANT
SUT SILK 2-0 18XBRD TIE 12 (SUTURE) ×2 IMPLANT
SUT SILK 3 0 (SUTURE) ×1
SUT SILK 3 0 SH CR/8 (SUTURE) ×3 IMPLANT
SUT SILK 3-0 18XBRD TIE 12 (SUTURE) ×2 IMPLANT
SUT VIC AB 3-0 SH 18 (SUTURE) ×3 IMPLANT
SUT VIC AB 3-0 SH 27 (SUTURE) ×1
SUT VIC AB 3-0 SH 27XBRD (SUTURE) ×2 IMPLANT
SYR BULB IRRIGATION 50ML (SYRINGE) ×3 IMPLANT
SYS LAPSCP GELPORT 120MM (MISCELLANEOUS)
SYSTEM LAPSCP GELPORT 120MM (MISCELLANEOUS) IMPLANT
TOWEL OR 17X26 10 PK STRL BLUE (TOWEL DISPOSABLE) ×6 IMPLANT
TRAY FOLEY CATH 14FRSI W/METER (CATHETERS) IMPLANT
TRAY LAPAROSCOPIC (CUSTOM PROCEDURE TRAY) ×3 IMPLANT
TRAY PROCTOSCOPIC FIBER OPTIC (SET/KITS/TRAYS/PACK) IMPLANT
TROCAR XCEL BLUNT TIP 100MML (ENDOMECHANICALS) IMPLANT
TROCAR XCEL NON-BLD 11X100MML (ENDOMECHANICALS) ×3 IMPLANT
TROCAR XCEL NON-BLD 5MMX100MML (ENDOMECHANICALS) ×3 IMPLANT
TUBE CONNECTING 12X1/4 (SUCTIONS) ×3 IMPLANT
TUBING FILTER THERMOFLATOR (ELECTROSURGICAL) ×3 IMPLANT
WATER STERILE IRR 1000ML POUR (IV SOLUTION) ×3 IMPLANT
YANKAUER SUCT BULB TIP NO VENT (SUCTIONS) ×6 IMPLANT

## 2012-06-13 NOTE — Op Note (Addendum)
05/30/2012 - 06/13/2012  4:01 PM  PATIENT:  [name] [name]  79 y.o. male  PRE-OPERATIVE DIAGNOSIS:  Gastric Adenocarcinoma With Colon Obstruction, Left Scalene Lymphadenopathy  POST-OPERATIVE DIAGNOSIS: Gastric Adenocarcinoma With  Colon Obstruction, Left Scalene Lymphadenopathy  PROCEDURE:  Procedure(s): EXPLORATORY LAPAROSCOPY  DIVERTING LOOP ILEOSTOMY LEFT SCALENE LYMPH NODE BIOPSY  SURGEON:  Surgeon(s): Liz Malady, MD  PHYSICIAN ASSISTANT:   ASSISTANTS:Megan Dort, PAC   ANESTHESIA:   local and general  EBL:  Total I/O In: 1300 [I.V.:1300] Out: 1750 [Urine:1750]  BLOOD ADMINISTERED:none  DRAINS: none   SPECIMEN:  Source of Specimen:  peritoneal fluid for cytology, left scalene lymph node for fresh permanent  DISPOSITION OF SPECIMEN:  PATHOLOGY  COUNTS:  YES  DICTATION: .Dragon Dictation  Patient was identified in the preop holding area. He was given intravenous antibiotics. Informed consent was obtained. It to the operating room. General endotracheal anesthesia was a Optician, dispensing by the anesthesia staff. His abdomen was prepped and draped in sterile fashion. Time out procedure was done. Infra-umbilical incision was made. Subcutaneous tissues were dissected down revealing the anterior fascia. This was divided along the midline.Peritoneal Cavity was entered under direct vision. 0 Vicryl pursestring suture was placed on the fascial opening. Hassan trocar was inserted into the abdomen. Abdomen was insufflated with carbon dioxide in standard fashion. Under direct vision a 12 mm right lower quadrant port and a 5 mm Left upper quadrant port were placed. Local was used at these port sites as well. Laparoscopic exploration revealed a significant amount of ascites. This was aspirated and sent for cytology. The stomach was inspected. There is visible tumor adherent a long the inferior greater curve. This appeared adherent to the transverse colon and that location. It could not be  separated easily. I took pictures. Exploration of the visualized peritoneal surfaces did not reveal any signs of carcinomatosis. The cecum is located. The terminal ileum was run back in the bowel appeared free of obstruction. Cecum and right colon were somewhat distended due to the obstruction. The right lower quadrant port site was placed in position for the ostomy. It was widened be enlarging the skin and muscular incision. Bleeding of subcutaneous tissues was cauterized. The terminal ileum was then located again and under laparoscopic guidance brought out through the port site after removing the port. Insufflation was released. A small space was made in the mesentery and the ostomy bar was placed. Infra-umbilical  fascia was closed by tying the 0 Vicryl purse string suture.The loop of ileum was oriented so there was no tension. The bowel was opened anteriorly on the antimesenteric border. Finger easily went down past the fascia on the afferent and efferent limb.  Ileostomy was matured with 3-0 Vicryl.  We reexposed the surrounding subcutaneous tissues to ensure good hemostasis with cautery. I changed my gloves.  Infraumbilical and left sided port site wounds were copiously irrigated and the skin of each closed with running 4-0 Monocryl. Dermabond was placed. All counts were correct. As the patient was waking up, some fluid collection seemed to develop around the left-sided port site. This was aspirated with 18-gauge needle and was some ascites leaking out. We put a pressure dressing. There was no bleeding. Attention was then directed to the left neck base. Left neck was entirely reprepped and draped. Transverse incision was made over palpable lymph node. Subcutaneous tissues were carefully dissected down using cautery revealing a 1.2 cm lymph node. This was circumferentially dissected and sent to pathology fresh. It was for permanent.  Wound was irrigated. Hemostasis was obtained. Subcutaneous tissues were closed  with running 3-0 Vicryl. Skin was closed with running 4-0 Monocryl followed by Dermabond. All counts were correct.Patient was taken to recovery room in stable condition. There were no apparent competitions.  PATIENT DISPOSITION:  PACU - hemodynamically stable.   Delay start of Pharmacological VTE agent (>24hrs) due to surgical blood loss or risk of bleeding:  no  Violeta Gelinas, MD, MPH, FACS Pager: [phone]  3/27/20144:01 PM

## 2012-06-13 NOTE — OR Nursing (Signed)
Procedure 2 Lymph Node Biopsy: Start @1526 , End @1549 

## 2012-06-13 NOTE — Anesthesia Procedure Notes (Signed)
Procedure Name: Intubation Date/Time: 06/13/2012 2:07 PM Performed by: Jerilee Hoh Pre-anesthesia Checklist: Patient identified, Emergency Drugs available, Suction available and Patient being monitored Patient Re-evaluated:Patient Re-evaluated prior to inductionOxygen Delivery Method: Circle system utilized Preoxygenation: Pre-oxygenation with 100% oxygen Intubation Type: IV induction Ventilation: Mask ventilation without difficulty Laryngoscope Size: Mac and 4 Grade View: Grade II Tube type: Oral Tube size: 7.5 mm Number of attempts: 1 Placement Confirmation: ETT inserted through vocal cords under direct vision,  positive ETCO2 and breath sounds checked- equal and bilateral Secured at: 22 cm Tube secured with: Tape Dental Injury: Teeth and Oropharynx as per pre-operative assessment

## 2012-06-13 NOTE — Progress Notes (Signed)
OT Cancellation Note  Patient Details Name: [name] [name] MRN: [id number] DOB: 1930/05/07   Cancelled Treatment:    Reason Eval/Treat Not Completed:  (sx scheduled this PM). Will re-attempt another date.  06/13/2012 [name] OTR/L Pager [phone] Office [phone]

## 2012-06-13 NOTE — Transfer of Care (Signed)
Immediate Anesthesia Transfer of Care Note  Patient: [name] [name]  Procedure(s) Performed: Procedure(s) with comments: EXPLORATORY LAPAROSCOPY  (N/A) DIVERTING LOOP ILEOSTOMY (N/A) LYMPH NODE BIOPSY (Left) - Scalene Lymph Node Biopsy  Patient Location: PACU  Anesthesia Type:General  Level of Consciousness: awake, alert , oriented and patient cooperative  Airway & Oxygen Therapy: Patient Spontanous Breathing and Patient connected to face mask oxygen  Post-op Assessment: Report given to PACU RN, Post -op Vital signs reviewed and stable and Patient moving all extremities  Post vital signs: Reviewed and stable  Complications: No apparent anesthesia complications

## 2012-06-13 NOTE — Progress Notes (Signed)
10 Days Post-Op  Subjective: No new complaints  Objective: Vital signs in last 24 hours: Temp:  [97.4 F (36.3 C)-98.7 F (37.1 C)] 97.9 F (36.6 C) (03/27 0400) Pulse Rate:  [60-80] 68 (03/27 0400) Resp:  [13-31] 24 (03/27 0400) BP: (97-118)/(57-75) 118/75 mmHg (03/27 0400) SpO2:  [91 %-98 %] 91 % (03/27 0400) Last BM Date: 06/10/12  Intake/Output from previous day: 03/26 0701 - 03/27 0700 In: 400 [I.V.:400] Out: 3425 [Urine:3425] Intake/Output this shift:    General appearance: alert and cooperative Resp: clear to auscultation bilaterally Cardio: irregularly irregular rhythm GI: soft, moderate distention, NT Neck: L scalene area LN palp X2 Lab Results:   Recent Labs  06/11/12 0450  WBC 9.2  HGB 10.8*  HCT 31.6*  PLT 309   BMET No results found for this basename: NA, K, CL, CO2, GLUCOSE, BUN, CREATININE, CALCIUM,  in the last 72 hours PT/INR No results found for this basename: LABPROT, INR,  in the last 72 hours ABG No results found for this basename: PHART, PCO2, PO2, HCO3,  in the last 72 hours  Studies/Results: No results found.  Anti-infectives: Anti-infectives   Start     Dose/Rate Route Frequency Ordered Stop   06/13/12 0000  ciprofloxacin (CIPRO) IVPB 400 mg    Comments:  On call to OR   400 mg 200 mL/hr over 60 Minutes Intravenous  Once 06/12/12 0834 06/13/12 0018   06/13/12 0000  metroNIDAZOLE (FLAGYL) IVPB 500 mg    Comments:  On call to OR   500 mg 100 mL/hr over 60 Minutes Intravenous  Once 06/12/12 0834 06/13/12 0017   05/30/12 2200  ciprofloxacin (CIPRO) IVPB 400 mg  Status:  Discontinued     400 mg 200 mL/hr over 60 Minutes Intravenous Every 12 hours 05/30/12 2123 06/04/12 0746   05/30/12 2200  metroNIDAZOLE (FLAGYL) IVPB 500 mg  Status:  Discontinued     500 mg 100 mL/hr over 60 Minutes Intravenous Every 8 hours 05/30/12 2123 06/04/12 0746      Assessment/Plan: s/p Procedure(s): COLONOSCOPY (N/A) ESOPHAGOGASTRODUODENOSCOPY (EGD)  (N/A) 1. Adenocarcinoma, likely gastric primary with stage 4 disease 2. Partial colonic obstruction secondary to stage 4 disease 3. H/o a fib with CVA 4. Possible CVA this admit 5. Deconditioning  Plan: 1.Xarelto held 2. To OR for exploratory laparoscopy, laparoscopic loop ileostomy, possible left scalene LN BX Procedure/risks/benefits D/W patient.  He agrees.   LOS: 14 days    Yehia Mcbain E 06/13/2012

## 2012-06-13 NOTE — Anesthesia Preprocedure Evaluation (Signed)
Anesthesia Evaluation  Patient identified by MRN, date of birth, ID band Patient awake    Reviewed: Allergy & Precautions, H&P , NPO status , Patient's Chart, lab work & pertinent test results  Airway Mallampati: II  Neck ROM: full    Dental   Pulmonary          Cardiovascular hypertension, +CHF + dysrhythmias Atrial Fibrillation     Neuro/Psych Anxiety TIA Neuromuscular disease CVA    GI/Hepatic   Endo/Other    Renal/GU      Musculoskeletal  (+) Arthritis -, Osteoarthritis,    Abdominal   Peds  Hematology   Anesthesia Other Findings   Reproductive/Obstetrics                           Anesthesia Physical Anesthesia Plan  ASA: III  Anesthesia Plan: General   Post-op Pain Management:    Induction: Intravenous  Airway Management Planned: Oral ETT  Additional Equipment:   Intra-op Plan:   Post-operative Plan: Extubation in OR  Informed Consent: I have reviewed the patients History and Physical, chart, labs and discussed the procedure including the risks, benefits and alternatives for the proposed anesthesia with the patient or authorized representative who has indicated his/her understanding and acceptance.     Plan Discussed with: CRNA and Surgeon  Anesthesia Plan Comments:         Anesthesia Quick Evaluation

## 2012-06-13 NOTE — Preoperative (Signed)
Beta Blockers   Reason not to administer Beta Blockers:Not Applicable 

## 2012-06-13 NOTE — Progress Notes (Signed)
[facility] Progress Note [place] TEAM 1 - Stepdown/ICU TEAM   [name] [name] WGN:[id number] DOB: [birth year] DOA: 05/30/2012 PCP: Miguel Aschoff, MD  Brief narrative: 79 year old male with multiple medical problems including chronic atrial fibrillation on pradaxa, hx of stroke, hypertension, and gastroparesis who presented to ED with complaints of abdominal distention with associated coffee-ground emesis. The symptoms started 2 days prior with abdominal pain diffusely with cramping sensation, rated 7-8/10. Subsequently he noticed that his abdomen was getting distended. This was associated with intermittent diarrhea over a month. He had been evaluated by his gastroenterologist (Dr. Madilyn Fireman) who advised lactose-free diet but unfortunately he saw no improvement in the diarrhea. On the morning of admission he reported feeling dizzy and lightheaded and had an episode of diarrhea. He had a syncopal episode.  Patient denied hitting his head and was witnessed by his wife. Per his wife, he was out for only a few seconds. When patient presented to the ED, he was noted to be in rapid A. Fib, HR in 130's, hypotensive BP in 80's, he was placed on IV fluids. He had 2 episodes of coffee-ground emesis in the ED. FOBT was positive in ED. A CT scan of the abdomen was obtained due to the abdominal distention and coffee-ground emesis which showed moderate ascites with diffuse fluid filled and dilated small bowel likely mechanical small bowel obstruction with potentail ischemic/infectious and inflammatory etiologies.   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   -colonoscopy/EGD unremarkable on direct visualization but unfortunately path returned positive for AdenoCA -Appreciate Onco assistance - felt to be stage 4 and given multiple co-morbidities suggestion was for Palliative Care consult - option of palliative chemo as OP -CCS plans laparoscopic exploration, ascites sampling and diverting loop ileostomy and possible scalene lymph node biopsy (3/27) as family wishes to investigate active tx options -PET scan as OP -Appreciate Palliative assistance  Hematemesis / gastric CA -pt w/ significant anorexia and early satiety- dislikes the sweetness of the protein supplements/nutrition assisting w/GI/surgery (pt request) -no evidence of bleeding on Xarelto (chosen for decreased risk of GIB) at this time   Acute delirium / TIA vs CVA / acute dysarthria -Resolved -CT Head negative - consulted Neuro -MRI initially delayed due to hypotension, and would likely add little to his current care therefore not to be pursued -had apparent admit for CVA Jan 2014 in [facility] and was also admitted to [facility] Jan 2014 for expressive aphasia  Diarrhea  -resolved in absence of significant intake   Rapid atrial fibrillation/flutter -Required Cardizem gtt while NPO and when BP was soft has been transitioned to and was tolerating oral CCB pre op -NSR on monitor at this time  -keep K+ >4.0  Hypokalemia -resolved -Was 2/2 high output diarrhea  Elevated cardiac enzymes -Likely due to rapid heart rate/ demand ischemia - normalized as of 3/19  Hx of HYPERTENSION, BENIGN SYSTEMIC not an active issue at this time   Code Status: Full code Family Communication: discussed with pt Disposition Plan: SDU  DVT prophylaxis: Xarelto-on hold 24 hours pre-op so SCD's  Consultants: Gastroenterology General Surgery Cardiology Neurology Oncology  Procedures: EGD (06/03/12)  1. Mucosal edema and slight nonerosive gastric erythema. Findings are nonspecific.  2.?  Obstructive sleep apnea based on tolerance of procedure as described above  RECOMMENDATIONS:  1. Await pathology results  2. Proceed to colonoscopic evaluation   Colonoscopy (/06/03/12) 1. Significant mucosal edema and spasm in the mid transverse colon. The etiology is not clear, but I wonder about ischemia. Efforts to advance the scope more proximally were unsuccessful within the constraints of the exam in terms of patient sedation tolerance, see above discussion.  RECOMMENDATIONS:  1. Await pathology results. Supportive care in the meantime.  Antibiotics: Ciprofloxacin 3/13>>3/18 Flagyl 3/13>>3/18  HPI/Subjective: Alert and pleasant. Eager to proceed with surgery. No CP/SOB.  Objective: Blood pressure 108/52, pulse 69, temperature 98.2 F (36.8 C), temperature source Oral, resp. rate 15, height 6\' 1"  (1.854 m), weight 88.9 kg (195 lb 15.8 oz), SpO2 96.00%.  Intake/Output Summary (Last 24 hours) at 06/13/12 1231 Last data filed at 06/13/12 0849  Gross per 24 hour  Intake    450 ml  Output   2500 ml  Net  -2050 ml   Exam: General: Awake alert oriented x3, no acute respiratory distress Lungs: Clear to auscultation bilaterally without wheezes or crackles Cardiovascular: Regular rate and normal sinus rhythm without murmur gallop or rub Abdomen: Nontender, mildly distended, soft Musculoskeletal: No significant cyanosis, clubbing of bilateral lower extremities   Data Reviewed: Basic Metabolic Panel:  Recent Labs Lab 06/07/12 0405 06/07/12 1217 06/07/12 1931 06/08/12 0535 06/09/12 0555 06/10/12 0544  NA 140 138 137 138 141 138  K 2.7* 3.1* 3.4* 3.5 3.8 4.6  CL 100 97 101 102 103 102  CO2 29 30 29 26 29 28   GLUCOSE 114* 122* 145* 117* 112* 114*  BUN 16 16 20 22 22 20   CREATININE 0.89 1.02 1.03 1.00 1.07 0.97  CALCIUM 9.0 9.2 8.4 8.6 8.6 9.1  MG 1.9  --   --   --   --   --   PHOS 2.9  --   --   --   --   --    CBC:  Recent Labs Lab 06/07/12 0405 06/08/12 0535  06/11/12 0450  WBC 8.9 7.9 9.2  HGB 11.2* 11.7* 10.8*  HCT 32.3* 33.5* 31.6*  MCV 94.4 95.2 95.8  PLT 272 301 309   Cardiac Enzymes: No results found for this basename: CKTOTAL, CKMB, CKMBINDEX, TROPONINI,  in the last 168 hours Studies:  Recent x-ray studies have been reviewed in detail by the Attending Physician  Scheduled Meds:  Scheduled Meds: . diltiazem  120 mg Oral Daily  . feeding supplement  237 mL Oral TID WC  . pantoprazole  40 mg Oral BID AC  . sodium chloride  3 mL Intravenous Q12H   Continuous Infusions: . sodium chloride 10 mL/hr (06/10/12 1751)  . sodium chloride 50 mL/hr at 06/12/12 2318    Time spent on care of this patient: 25 minutes  Junious Silk, ANP [facility] Office  [phone] Pager [phone]  On-Call/Text Page:      [email or web address]      password [email or web address]  If 7PM-7AM, please contact night-coverage [email or web address] Password [facility] 06/13/2012, 12:31 PM   LOS: 14 days    I have examined the patient, reviewed the chart and modified the above note which I agree with.   Ayano Douthitt,MD [phone] 06/13/2012, 7:09 PM

## 2012-06-13 NOTE — Anesthesia Postprocedure Evaluation (Signed)
  Anesthesia Post-op Note  Patient: [name] [name]  Procedure(s) Performed: Procedure(s) with comments: EXPLORATORY LAPAROSCOPY  (N/A) DIVERTING LOOP ILEOSTOMY (N/A) LYMPH NODE BIOPSY (Left) - Scalene Lymph Node Biopsy  Patient Location: PACU  Anesthesia Type:General  Level of Consciousness: awake, oriented, sedated and patient cooperative  Airway and Oxygen Therapy: Patient Spontanous Breathing  Post-op Pain: mild  Post-op Assessment: Post-op Vital signs reviewed, Patient's Cardiovascular Status Stable, Respiratory Function Stable, Patent Airway, No signs of Nausea or vomiting and Pain level controlled  Post-op Vital Signs: stable  Complications: No apparent anesthesia complications

## 2012-06-14 ENCOUNTER — Encounter (HOSPITAL_COMMUNITY): Payer: Self-pay | Admitting: General Surgery

## 2012-06-14 DIAGNOSIS — Z932 Ileostomy status: Secondary | ICD-10-CM

## 2012-06-14 HISTORY — DX: Ileostomy status: Z93.2

## 2012-06-14 LAB — CBC
Hemoglobin: 10.2 g/dL — ABNORMAL LOW (ref 13.0–17.0)
MCH: 33.3 pg (ref 26.0–34.0)
MCHC: 34.9 g/dL (ref 30.0–36.0)
MCV: 95.4 fL (ref 78.0–100.0)
Platelets: 338 10*3/uL (ref 150–400)
RBC: 3.06 MIL/uL — ABNORMAL LOW (ref 4.22–5.81)

## 2012-06-14 MED ORDER — RIVAROXABAN 20 MG PO TABS
20.0000 mg | ORAL_TABLET | Freq: Every day | ORAL | Status: DC
  Administered 2012-06-14 – 2012-06-17 (×4): 20 mg via ORAL
  Filled 2012-06-14 (×6): qty 1

## 2012-06-14 NOTE — Progress Notes (Signed)
    SUBJECTIVE: No complaints. No chest pain or SOB  BP 118/53  Pulse 76  Temp(Src) 97.7 F (36.5 C) (Oral)  Resp 14  Ht 6\' 1"  (1.854 m)  Wt 194 lb 14.2 oz (88.4 kg)  BMI 25.72 kg/m2  SpO2 97%  Intake/Output Summary (Last 24 hours) at 06/14/12 0745 Last data filed at 06/14/12 0300  Gross per 24 hour  Intake   2000 ml  Output   2250 ml  Net   -250 ml    PHYSICAL EXAM General: Well developed, well nourished, in no acute distress. Alert and oriented x 3.  Psych:  Good affect, responds appropriately Neck: No JVD. No masses noted.  Lungs: Clear bilaterally with no wheezes or rhonci noted.  Heart: RRR with no murmurs noted. Abdomen: Bowel sounds are present. Soft, non-tender.  Extremities: No lower extremity edema.   LABS: CBC:  Recent Labs  06/14/12 0526  WBC 8.7  HGB 10.2*  HCT 29.2*  MCV 95.4  PLT 338   Current Meds: . diltiazem  120 mg Oral Daily  . feeding supplement  237 mL Oral TID WC  . pantoprazole  40 mg Oral BID AC  . sodium chloride  3 mL Intravenous Q12H     ASSESSMENT AND PLAN: [name] [name] is a 79 year old male with history of atrial fibrillation, previously on pradaxa for long term anti-coagulation with recurrent CVAs admitted with GI bleeding. Pradaxa held on admission for GI bleed. Maintaining NSR with some ectopy. During this admission he has been diagnosed with stage 4 adenocarcinoma of the stomach. Xarelto held for surgery.   1. Atrial fibrillation, paroxysmal: Currently in NSR. Continue Cardizem po. Anti-coagulation on hold post-op.    2. Chronic diastolic CHF: Volume ok. Systolic function normal on echo 3/18. No significant valvular disease.   3. CVA: Per primary team. He will need to be restarted on anti-coagulation when ok with surgical team. He has been on Xarelto earlier in the hospitalization. Currently being held post-op.   4. 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 Janee Morn yesterday with  scalene node biopsy. Further plans per oncology/surgical teams.      [name] [name]  3/28/20147:45 AM

## 2012-06-14 NOTE — Progress Notes (Signed)
Physical Therapy Treatment Patient Details Name: [name] [name] MRN: [id number] DOB: 02-08-1931 Today's Date: 06/14/2012 Time: 4098-1191 PT Time Calculation (min): 35 min  PT Assessment / Plan / Recommendation Comments on Treatment Session  Patient tolerated ambulation wtih increased pain and desaturation on room air.  Still able to ambulate out of the room, but needs further rehab to improve tolerance and independence.  Continue to recommend CIR prior to d/c home.    Follow Up Recommendations  CIR     Does the patient have the potential to tolerate intense rehabilitation   yes     Equipment Recommendations  Rolling walker with 5" wheels    Recommendations for Other Services  N/A  Frequency Min 3X/week   Plan Discharge plan remains appropriate    Precautions / Restrictions Precautions Precautions: Fall Precaution Comments: ileostomy, abdominal incisional wound   Pertinent Vitals/Pain 7/10 in abdomen with ambulation, RN aware    Mobility  Bed Mobility Bed Mobility: Rolling Right;Right Sidelying to Sit;Sitting - Scoot to Edge of Bed Rolling Right: With rail;3: Mod assist Right Sidelying to Sit: 3: Mod assist;With rails;HOB flat Sitting - Scoot to Edge of Bed: 5: Supervision Details for Bed Mobility Assistance: cues for technique through sidelying due to incision  Transfers Sit to Stand: 1: +2 Total assist;From bed;With upper extremity assist;From chair/3-in-1 Sit to Stand: Patient Percentage: 60% Stand to Sit: To chair/3-in-1;3: Mod assist;With armrests Details for Transfer Assistance: cues for technique increased assist due to weakness, pain following surgery yesterday Ambulation/Gait Ambulation/Gait Assistance: 3: Mod assist Ambulation Distance (Feet): 22 Feet (and 12') Assistive device: Rolling walker Ambulation/Gait Assistance Details: seated rest between two ambulation trials. Cues for step length, heel strike; initially off balance back and to left Gait Pattern:  Step-to pattern;Step-through pattern;Decreased step length - right;Decreased stance time - left;Shuffle;Trunk flexed    Exercises General Exercises - Lower Extremity Ankle Circles/Pumps: AROM;15 reps;Supine Heel Slides: AAROM;Both;10 reps;Supine    PT Goals Acute Rehab PT Goals Pt will go Supine/Side to Sit: with modified independence PT Goal: Supine/Side to Sit - Progress: Progressing toward goal Pt will go Sit to Stand: with supervision PT Goal: Sit to Stand - Progress: Progressing toward goal Pt will go Stand to Sit: with supervision PT Goal: Stand to Sit - Progress: Progressing toward goal Pt will Stand: with min assist;3 - 5 min;with unilateral upper extremity support PT Goal: Stand - Progress: Progressing toward goal Pt will Ambulate: with min assist;with least restrictive assistive device;with supervision;>150 feet PT Goal: Ambulate - Progress: Progressing toward goal  Visit Information  Last PT Received On: 06/14/12    Subjective Data  Subjective: Feel less bloated in my stomach.   Cognition  Cognition Overall Cognitive Status: Appears within functional limits for tasks assessed/performed Arousal/Alertness: Awake/alert Orientation Level: Appears intact for tasks assessed Behavior During Session: [facility] for tasks performed    Balance  Static Sitting Balance Static Sitting - Balance Support: Bilateral upper extremity supported;Feet supported Static Sitting - Level of Assistance: 5: Stand by assistance Static Sitting - Comment/# of Minutes: still limited cervical AROM wtih sitting has to lean back to look up Static Standing Balance Static Standing - Balance Support: Bilateral upper extremity supported Static Standing - Level of Assistance: 3: Mod assist Static Standing - Comment/# of Minutes: leaning back and to left in stainding  End of Session PT - End of Session Equipment Utilized During Treatment: Gait belt Activity Tolerance: Patient limited by fatigue;Patient  limited by pain Patient left: in chair;with call bell/phone within  reach   GP     [facility] 06/14/2012, 10:47 AM Sheran Lawless, PT [phone] 06/14/2012

## 2012-06-14 NOTE — Progress Notes (Signed)
[facility] Progress Note [place] TEAM 1 - Stepdown/ICU TEAM   [name] [name]:[id number] DOB: [birth year] DOA: 05/30/2012 PCP: Miguel Aschoff, MD  Brief narrative: 79 year old male with multiple medical problems including chronic atrial fibrillation on pradaxa, hx of stroke, hypertension, and gastroparesis who presented to ED with complaints of abdominal distention with associated coffee-ground emesis. The symptoms started 2 days prior with abdominal pain diffusely with cramping sensation, rated 7-8/10. Subsequently he noticed that his abdomen was getting distended. This was associated with intermittent diarrhea over a month. He had been evaluated by his gastroenterologist (Dr. Madilyn Fireman) who advised lactose-free diet but unfortunately he saw no improvement in the diarrhea. On the morning of admission he reported feeling dizzy and lightheaded and had an episode of diarrhea. He had a syncopal episode.  Patient denied hitting his head and was witnessed by his wife. Per his wife, he was out for only a few seconds. When patient presented to the ED, he was noted to be in rapid A. Fib, HR in 130's, hypotensive BP in 80's, he was placed on IV fluids. He had 2 episodes of coffee-ground emesis in the ED. FOBT was positive in ED. A CT scan of the abdomen was obtained due to the abdominal distention and coffee-ground emesis which showed moderate ascites with diffuse fluid filled and dilated small bowel likely mechanical small bowel obstruction with potentail ischemic/infectious and inflammatory etiologies.   Assessment/Plan:  Persistent / recurrent hypotension w/ orthostasis -In the past, he is noted to become easily dehydrated, therefore  IVF's are being continued at 50/hr while diet being advanced -watch for [facility] due to potential high output ileostomy syndrome  Acute respiratory failure with hypoxia due to RVR -resolved and somewhat stable on RA - desat with mobilization/ambulation so oxygen via [place]  initiated  Adenocarcinoma of stomach (linitis plastica) with reactive PSBO  -colonoscopy/EGD unremarkable on direct visualization but unfortunately path returned positive for AdenoCA of stomach -Appreciate Onco assistance - felt to be stage 4 and given multiple co-morbidities, the suggestion was for Palliative Care consult - option of palliative chemo as OP -CCS /post lap loop ieostomy, LN biopsy and peritoneal fluid sampling- path pending -PET scan as OP -Appreciate Palliative assistance -DC Cipro Flagyl since D# 14 and no sx's colitis  Hematemesis / gastric CA -pt w/ significant anorexia and early satiety- dislikes the sweetness of the protein supplements/nutrition assisting w/GI/surgery (pt request) -no evidence of bleeding on Xarelto (chosen for decreased risk of GIB) at this time -CCS has approved resume post op  Acute delirium / TIA vs CVA / acute dysarthria -Resolved -CT Head negative - consulted Neuro -MRI initially delayed due to hypotension, and would likely add little to his current care therefore not to be pursued -had apparent admit for CVA Jan 2014 in [facility] and was also admitted to [facility] Jan 2014 for expressive aphasia  Diarrhea  -resolved in absence of significant intake   Rapid atrial fibrillation/flutter -Required Cardizem gtt while NPO and when BP was soft has been transitioned to and was tolerating oral CCB pre op -NSR on monitor at this time  -keep K+ >4.0  Hypokalemia -resolved -Was 2/2 high output diarrhea  Elevated cardiac enzymes -Likely due to rapid heart rate/ demand ischemia - normalized as of 3/19  Hx of HYPERTENSION, BENIGN SYSTEMIC not an active issue at this time   Code Status: Full code Family Communication: discussed with pt Disposition Plan: Transfer to Telemetry/surgery unit DVT prophylaxis: Xarelto  Consultants: Gastroenterology General Surgery Cardiology Neurology  Oncology  Procedures: EGD (06/03/12)  1. Mucosal edema and  slight nonerosive gastric erythema. Findings are nonspecific.  2.? Obstructive sleep apnea based on tolerance of procedure as described above  RECOMMENDATIONS:  1. Await pathology results  2. Proceed to colonoscopic evaluation   Colonoscopy (/06/03/12) 1. Significant mucosal edema and spasm in the mid transverse colon. The etiology is not clear, but I wonder about ischemia. Efforts to advance the scope more proximally were unsuccessful within the constraints of the exam in terms of patient sedation tolerance, see above discussion.  RECOMMENDATIONS:  1. Await pathology results. Supportive care in the meantime.  Antibiotics: Ciprofloxacin 3/13>>>3/18 >>3/28 Flagyl 3/13>>3/18 >> 3/28  HPI/Subjective: Alert and pleasant. No complaints- eager to learn care for ostomy  Objective: Blood pressure 117/72, pulse 77, temperature 98.8 F (37.1 C), temperature source Oral, resp. rate 15, height 6\' 1"  (1.854 m), weight 88.4 kg (194 lb 14.2 oz), SpO2 97.00%.  Intake/Output Summary (Last 24 hours) at 06/14/12 1332 Last data filed at 06/14/12 1125  Gross per 24 hour  Intake   2203 ml  Output   1900 ml  Net    303 ml   Exam: General: Awake alert oriented x3, no acute respiratory distress Lungs: Clear to auscultation bilaterally without wheezes or crackles Cardiovascular: Regular rate and normal sinus rhythm without murmur gallop or rub Abdomen: Nontender, mildly distended, soft;R side ileostomy stoma pink and edematous with dark liquid stool Musculoskeletal: No significant cyanosis, clubbing of bilateral lower extremities   Data Reviewed: Basic Metabolic Panel:  Recent Labs Lab 06/07/12 1931 06/08/12 0535 06/09/12 0555 06/10/12 0544  NA 137 138 141 138  K 3.4* 3.5 3.8 4.6  CL 101 102 103 102  CO2 29 26 29 28   GLUCOSE 145* 117* 112* 114*  BUN 20 22 22 20   CREATININE 1.03 1.00 1.07 0.97  CALCIUM 8.4 8.6 8.6 9.1   CBC:  Recent Labs Lab 06/08/12 0535 06/11/12 0450 06/14/12 0526   WBC 7.9 9.2 8.7  HGB 11.7* 10.8* 10.2*  HCT 33.5* 31.6* 29.2*  MCV 95.2 95.8 95.4  PLT 301 309 338   Cardiac Enzymes: No results found for this basename: CKTOTAL, CKMB, CKMBINDEX, TROPONINI,  in the last 168 hours Studies:  Recent x-ray studies have been reviewed in detail by the Attending Physician  Scheduled Meds:  Scheduled Meds: . diltiazem  120 mg Oral Daily  . feeding supplement  237 mL Oral TID WC  . pantoprazole  40 mg Oral BID AC  . rivaroxaban  20 mg Oral Q supper  . sodium chloride  3 mL Intravenous Q12H   Continuous Infusions: . sodium chloride 50 mL/hr at 06/12/12 2318    Time spent on care of this patient: 25 minutes  Junious Silk, ANP [facility] Office  [phone] Pager [phone]  On-Call/Text Page:      [email or web address]      password [email or web address]  If 7PM-7AM, please contact night-coverage [email or web address] Password [email or web address] 06/14/2012, 1:32 PM   LOS: 15 days     I have examined the patient, reviewed the chart and modified the above note which I agree with.   Deseray Daponte,MD [phone] 06/14/2012, 3:44 PM

## 2012-06-14 NOTE — Progress Notes (Signed)
ANTICOAGULATION CONSULT NOTE Pharmacy Consult for Xarelto Indication: afib/CVA  Allergies  Allergen Reactions  . Penicillins Rash    Rash appeared on palms of hands and feet.    Labs:  Recent Labs  06/14/12 0526  HGB 10.2*  HCT 29.2*  PLT 338    Estimated Creatinine Clearance: 69.8 ml/min (by C-G formula based on Cr of 0.97).  Assessment: 79 year old man s/p ex lap with diverting loop ileostomy 3/27.  OK to resume Xarelto for afib post op per surgery  Goal of Therapy:  Therapeutic anticoagulation   Plan:  Xarelto 20mg  po daily   Celedonio Miyamoto, PharmD, [facility] Clinical Pharmacist Pager [phone]   06/14/2012 9:48 AM

## 2012-06-14 NOTE — Progress Notes (Signed)
Pt was transported to new room, Nurse called report to [place].  Report was delayed due new floor priorities, and current floor priorities.  Pt was alert and oriented prior to transport without questions and concerns.  Pt wife was called and informed of new room.

## 2012-06-14 NOTE — Consult Note (Signed)
WOC ostomy consult  Stoma type/location: RLQ, loop ileostomy Stomal assessment/size: aprox. 2" budded with support rod in place Peristomal assessment: did not change pouch today, POD1 Output liquid dark green in pouch Ostomy pouching: 2pc. In place from OR.  Will order supplies to the bedside for the weekend if needed Education provided: provided basis education on the creation of the stoma and what having an ileostomy will mean for patient. Wife at bedside and they are both very interested in learning.  Provided educational materials to wife with DVD and sample pouches.   Discussed dehydration prevention, diet and pouch change regimens.Planned educational session Monday with with and patient to demonstrate pouch change.  WOC will follow along with you for ostomy care and teaching. Gailen Venne [place] RN,CWOCN [phone]

## 2012-06-14 NOTE — Progress Notes (Addendum)
1 Day Post-Op  Subjective: Little pain, no NV  Objective: Vital signs in last 24 hours: Temp:  [97.3 F (36.3 C)-98.5 F (36.9 C)] 97.7 F (36.5 C) (03/28 0749) Pulse Rate:  [62-89] 81 (03/28 0749) Resp:  [10-27] 10 (03/28 0749) BP: (104-131)/(33-75) 124/71 mmHg (03/28 0749) SpO2:  [92 %-100 %] 92 % (03/28 0749) Weight:  [88.4 kg (194 lb 14.2 oz)] 88.4 kg (194 lb 14.2 oz) (03/27 2333) Last BM Date: 06/10/12  Intake/Output from previous day: 03/27 0701 - 03/28 0700 In: 2000 [P.O.:200; I.V.:1800] Out: 2250 [Urine:2250] Intake/Output this shift: Total I/O In: -  Out: 650 [Urine:650]  General appearance: cooperative Resp: clear to auscultation bilaterally Cardio: regular rate and rhythm GI: soft, ileostomy pink with stool and air, incisions cdi Left neck incision cdi Lab Results:   Recent Labs  06/14/12 0526  WBC 8.7  HGB 10.2*  HCT 29.2*  PLT 338   BMET No results found for this basename: NA, K, CL, CO2, GLUCOSE, BUN, CREATININE, CALCIUM,  in the last 72 hours PT/INR No results found for this basename: LABPROT, INR,  in the last 72 hours ABG No results found for this basename: PHART, PCO2, PO2, HCO3,  in the last 72 hours  Studies/Results: No results found.  Anti-infectives: Anti-infectives   Start     Dose/Rate Route Frequency Ordered Stop   06/13/12 1342  ciprofloxacin (CIPRO) 400 MG/200ML IVPB    Comments:  MUMM,VALERIE: cabinet override      06/13/12 1342 06/13/12 1412   06/13/12 0000  ciprofloxacin (CIPRO) IVPB 400 mg    Comments:  On call to OR   400 mg 200 mL/hr over 60 Minutes Intravenous  Once 06/12/12 0834 06/13/12 0018   06/13/12 0000  metroNIDAZOLE (FLAGYL) IVPB 500 mg    Comments:  On call to OR   500 mg 100 mL/hr over 60 Minutes Intravenous  Once 06/12/12 0834 06/13/12 0017   05/30/12 2200  ciprofloxacin (CIPRO) IVPB 400 mg  Status:  Discontinued     400 mg 200 mL/hr over 60 Minutes Intravenous Every 12 hours 05/30/12 2123 06/04/12 0746   05/30/12 2200  metroNIDAZOLE (FLAGYL) IVPB 500 mg  Status:  Discontinued     500 mg 100 mL/hr over 60 Minutes Intravenous Every 8 hours 05/30/12 2123 06/04/12 0746      Assessment/Plan: s/p Procedure(s) with comments: EXPLORATORY LAPAROSCOPY  (N/A) DIVERTING LOOP ILEOSTOMY (N/A) LYMPH NODE BIOPSY (Left) - Scalene Lymph Node Biopsy POD#1 Advance to fulls tonight Await path Wound ostomy RN eval OK to restart xarelto I discussed findings with the patient including reviewing operative pictures    LOS: 15 days    [name] [name] E 06/14/2012

## 2012-06-14 NOTE — Progress Notes (Signed)
Operative findings noted. Discussed with patient. I will followup on the peritoneal fluid cytology and left neck lymph node pathology.  Please call oncology as needed over the weekend. I will check on him 06/17/2012.

## 2012-06-14 NOTE — Progress Notes (Signed)
Occupational Therapy Treatment Patient Details Name: [name] [name] MRN: [id number] DOB: 11/29/1930 Today's Date: 06/14/2012 Time: 1100-1113 OT Time Calculation (min): 13 min  OT Assessment / Plan / Recommendation Comments on Treatment Session Pt s/p sx yesterday (exploratory lap/ ileostomy/ lymph node biopsy).  Increased pain and decreased activity tolerance today.      Follow Up Recommendations  CIR    Barriers to Discharge       Equipment Recommendations  3 in 1 bedside comode    Recommendations for Other Services Rehab consult  Frequency Min 2X/week   Plan Discharge plan remains appropriate    Precautions / Restrictions Precautions Precautions: Fall Precaution Comments: ileostomy, abdominal incisional wound   Pertinent Vitals/Pain See vitals    ADL  Grooming: Wash/dry face;Performed;Wash/dry hands;Min guard Where Assessed - Grooming: Supported standing Toilet Transfer: Chief of Staff: Patient Percentage: 70% Statistician Method: Sit to Barista:  (chair) Equipment Used: Rolling walker Transfers/Ambulation Related to ADLs: +2 assist to stand from chair. Min-mod assist wtih RW ambulating to sink. ADL Comments: decreased activity tolerance. somewhat limited due to pain.    OT Diagnosis:    OT Problem List:   OT Treatment Interventions:     OT Goals ADL Goals Pt Will Perform Grooming: with supervision;Standing at sink ADL Goal: Grooming - Progress: Progressing toward goals Pt Will Transfer to Toilet: with supervision;Ambulation;with DME ADL Goal: Toilet Transfer - Progress: Progressing toward goals  Visit Information  Last OT Received On: 06/14/12    Subjective Data      Prior Functioning       Cognition  Cognition Overall Cognitive Status: Appears within functional limits for tasks assessed/performed Arousal/Alertness: Awake/alert Orientation Level: Appears intact for tasks assessed Behavior  During Session: [facility] for tasks performed    Mobility  Bed Mobility Bed Mobility: Not assessed Transfers Transfers: Sit to Stand;Stand to Sit Sit to Stand: 1: +2 Total assist;From chair/3-in-1 Sit to Stand: Patient Percentage: 70% Stand to Sit: 3: Mod assist;To chair/3-in-1 Details for Transfer Assistance: cues for technique increased assist due to weakness, pain following surgery yesterday    Exercises      Balance     End of Session OT - End of Session Equipment Utilized During Treatment: Gait belt Activity Tolerance: Patient limited by fatigue;Patient limited by pain Patient left: in chair;with call bell/phone within reach  GO   06/14/2012 [name] OTR/L Pager [phone] Office [phone]   [name] 06/14/2012, 4:14 PM

## 2012-06-15 NOTE — Progress Notes (Signed)
2 Days Post-Op   Assessment: s/p Procedure(s): EXPLORATORY LAPAROSCOPY  DIVERTING LOOP ILEOSTOMY LYMPH NODE BIOPSY Patient Active Problem List  Diagnosis  . ERECTILE DYSFUNCTION  . TOBACCO DEPENDENCE  . NEUROPATHY, IDIOPATHIC PERIPHERAL  . HYPERTENSION, BENIGN SYSTEMIC  . DIVERTICULOSIS OF COLON  . BPH  . SCIATICA, RIGHT  . METATARSALGIA  . NUMBNESS  . Left knee pain  . Knee effusion, left  . Chest pain  . CVA (cerebral infarction)  . HTN (hypertension), malignant  . TIA (transient ischemic attack)  . Atrial fibrillation  . Gastroparesis  . Atrial fibrillation, rapid  . GI bleed  . Abdominal distention  . Syncope  . SBO (small bowel obstruction)  . Diastolic CHF, acute  . Acute diastolic heart failure  . Acute respiratory distress  . Acute encephalopathy  . Dysarthria, acute  . Paroxysmal a-fib/flutter  . Hypokalemia  . Weakness generalized  . Pain, abdominal, generalized  . Neck stiffness  . Adenocarcinoma  . Ileostomy in place    Progressinig  Plan: d/c foley Advance diet  Subjective: Feels OK, good pain control, no nausea on clear liquids. Wants solid foods  Objective: Vital signs in last 24 hours: Temp:  [97.9 F (36.6 C)-98.8 F (37.1 C)] 97.9 F (36.6 C) (03/29 0517) Pulse Rate:  [75-91] 75 (03/29 0517) Resp:  [11-16] 16 (03/29 0517) BP: (104-119)/(58-72) 116/63 mmHg (03/29 0517) SpO2:  [91 %-99 %] 96 % (03/29 0517)   Intake/Output from previous day: 03/28 0701 - 03/29 0700 In: 1519.7 [P.O.:400; I.V.:1119.7] Out: 3450 [Urine:2900; Stool:550]  General appearance: alert, cooperative and no distress Resp: clear to auscultation bilaterally Cardio: regular rate and rhythm, S1, S2 normal, no murmur, click, rub or gallop GI: Soft, basically benign and not tender  Incision: healing well  Lab Results:   Recent Labs  06/14/12 0526  WBC 8.7  HGB 10.2*  HCT 29.2*  PLT 338   BMET No results found for this basename: NA, K, CL, CO2,  GLUCOSE, BUN, CREATININE, CALCIUM,  in the last 72 hours  MEDS, Scheduled . diltiazem  120 mg Oral Daily  . feeding supplement  237 mL Oral TID WC  . pantoprazole  40 mg Oral BID AC  . rivaroxaban  20 mg Oral Q supper  . sodium chloride  3 mL Intravenous Q12H    Studies/Results: No results found.    LOS: 16 days     Currie Paris, MD, [facility], [place] [phone]   06/15/2012 9:28 AM

## 2012-06-15 NOTE — Progress Notes (Signed)
Progress Note from the Palliative Medicine Team at [facility]  Subjective: patient is alert and oriented X3, wife at bedside, both speak to the fact that last night was "rough" with confusion  --offered education regarding concept of delirium    Objective: Allergies  Allergen Reactions  . Penicillins Rash    Rash appeared on palms of hands and feet.   Scheduled Meds: . diltiazem  120 mg Oral Daily  . feeding supplement  237 mL Oral TID WC  . pantoprazole  40 mg Oral BID AC  . rivaroxaban  20 mg Oral Q supper  . sodium chloride  3 mL Intravenous Q12H   Continuous Infusions: . sodium chloride 50 mL/hr at 06/14/12 1403   PRN Meds:.acetaminophen, acetaminophen, morphine injection, morphine CONCENTRATE, ondansetron (ZOFRAN) IV  BP 116/63  Pulse 75  Temp(Src) 97.9 F (36.6 C) (Oral)  Resp 16  Ht 6\' 1"  (1.854 m)  Wt 88.4 kg (194 lb 14.2 oz)  BMI 25.72 kg/m2  SpO2 96%   PPS:30 %  Pain Score:-denies presetnly  Intake/Output Summary (Last 24 hours) at 06/15/12 1132 Last data filed at 06/15/12 0900  Gross per 24 hour  Intake 1256.67 ml  Output   2350 ml  Net -1093.33 ml      Physical Exam:  General: ill appearing, elderly male,  NAD HEENT:  Moist MM, no exudate Chest:   CTA CVS: RRR Abdomen: NT +BS, ostomy noted  WNL Ext: without edema Neuro: alert and oriented X3 presetnly  Labs: CBC    Component Value Date/Time   WBC 8.7 06/14/2012 0526   RBC 3.06* 06/14/2012 0526   HGB 10.2* 06/14/2012 0526   HCT 29.2* 06/14/2012 0526   PLT 338 06/14/2012 0526   MCV 95.4 06/14/2012 0526   MCH 33.3 06/14/2012 0526   MCHC 34.9 06/14/2012 0526   RDW 13.6 06/14/2012 0526   LYMPHSABS 0.7 05/30/2012 0926   MONOABS 0.8 05/30/2012 0926   EOSABS 0.0 05/30/2012 0926   BASOSABS 0.0 05/30/2012 0926    BMET    Component Value Date/Time   NA 138 06/10/2012 0544   K 4.6 06/10/2012 0544   CL 102 06/10/2012 0544   CO2 28 06/10/2012 0544   GLUCOSE 114* 06/10/2012 0544   BUN 20 06/10/2012 0544   CREATININE 0.97 06/10/2012 0544   CALCIUM 9.1 06/10/2012 0544   GFRNONAA 76* 06/10/2012 0544   GFRAA 89* 06/10/2012 0544    CMP     Component Value Date/Time   NA 138 06/10/2012 0544   K 4.6 06/10/2012 0544   CL 102 06/10/2012 0544   CO2 28 06/10/2012 0544   GLUCOSE 114* 06/10/2012 0544   BUN 20 06/10/2012 0544   CREATININE 0.97 06/10/2012 0544   CALCIUM 9.1 06/10/2012 0544   PROT 5.0* 06/09/2012 0555   ALBUMIN 2.2* 06/09/2012 0555   AST 27 06/09/2012 0555   ALT 18 06/09/2012 0555   ALKPHOS 144* 06/09/2012 0555   BILITOT 0.4 06/09/2012 0555   GFRNONAA 76* 06/10/2012 0544   GFRAA 89* 06/10/2012 0544      Assessment and Plan: 1. Code Status: FULL 2. Symptom Control: Morphine as ordered 3. Psycho/Social:Emotional support offered at bedside, anxiously await  Result so pathology 4. Spiritual no wish for chaplain intervention at this time 5. Disposition: Hopeful for in house rehab and then home  Lorinda Creed NP  Palliative Medicine Team Team Phone # [phone] Pager [phone]  PMT wiill continue to support holistically  1

## 2012-06-15 NOTE — Progress Notes (Signed)
   Primary cardiologist: Dr. Verne Carrow  Subjective:   No chest pain or palpitations.   Objective:   Temp:  [97.9 F (36.6 C)-98.8 F (37.1 C)] 97.9 F (36.6 C) (03/29 0517) Pulse Rate:  [75-91] 75 (03/29 0517) Resp:  [11-16] 16 (03/29 0517) BP: (104-119)/(58-72) 116/63 mmHg (03/29 0517) SpO2:  [91 %-99 %] 96 % (03/29 0517) Last BM Date: 06/14/12  Filed Weights   06/11/12 0400 06/12/12 0400 06/13/12 2333  Weight: 190 lb 7.6 oz (86.4 kg) 195 lb 15.8 oz (88.9 kg) 194 lb 14.2 oz (88.4 kg)    Intake/Output Summary (Last 24 hours) at 06/15/12 1610 Last data filed at 06/15/12 0900  Gross per 24 hour  Intake 1256.67 ml  Output   2750 ml  Net -1493.33 ml   Telemetry: Sinus rhythm.  Exam:  General: NAD.  Lungs: Clear,, nonlabored.  Cardiac: RRR.  Extremities: No pitting.  Lab Results:  Basic Metabolic Panel:  Recent Labs Lab 06/09/12 0555 06/10/12 0544  NA 141 138  K 3.8 4.6  CL 103 102  CO2 29 28  GLUCOSE 112* 114*  BUN 22 20  CREATININE 1.07 0.97  CALCIUM 8.6 9.1    Liver Function Tests:  Recent Labs Lab 06/09/12 0555  AST 27  ALT 18  ALKPHOS 144*  BILITOT 0.4  PROT 5.0*  ALBUMIN 2.2*    CBC:  Recent Labs Lab 06/11/12 0450 06/14/12 0526  WBC 9.2 8.7  HGB 10.8* 10.2*  HCT 31.6* 29.2*  MCV 95.8 95.4  PLT 309 338     Medications:   Scheduled Medications: . diltiazem  120 mg Oral Daily  . feeding supplement  237 mL Oral TID WC  . pantoprazole  40 mg Oral BID AC  . rivaroxaban  20 mg Oral Q supper  . sodium chloride  3 mL Intravenous Q12H     Infusions: . sodium chloride 50 mL/hr at 06/14/12 1403     PRN Medications:  acetaminophen, acetaminophen, morphine injection, morphine CONCENTRATE, ondansetron (ZOFRAN) IV   Assessment:   1. PAF, maintaining sinus rhythm.  2. Stage 4 metastatic adenocardinoma of stomach. Status post diverting loop ileostomy with scalene node biopsy. Further plans per oncology/surgical  teams.    Plan/Discussion:    Continue oral Cardizem CD. Back on Xarelto now. Heart rate stable. We are following.   Jonelle Sidle, M.D., F.A.C.C.

## 2012-06-15 NOTE — Progress Notes (Signed)
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  -colonoscopy/EGD unremarkable on direct visualization but unfortunately path returned positive for metastatic AdenoCA of stomach  -Appreciate Oncology assistance, will follow on 3/31 (Dr Myrle Sheng)  -CCS /post lap loop ieostomy, LN biopsy and peritoneal fluid sampling -PET scan as OP   Hematemesis / gastric CA / anorexia -no evidence of bleeding on Xarelto (chosen for decreased risk of GIB) at this time -CCS has approved resume post op   Acute delirium / TIA vs CVA / acute dysarthria: worse at night   -CT Head negative - consulted Neuro  - d/w patient's wife at bedside, she confirms that it is improving was worse in ICU/SDU -had apparent admit for CVA Jan 2014 in [facility] and was also admitted to [facility] Jan 2014 for expressive aphasia   Diarrhea  -resolved in absence of significant intake   Rapid atrial fibrillation/flutter  -Required Cardizem gtt while NPO, currently tolerating oral Cardizem 120mg  daily  Elevated cardiac enzymes  -Likely due to rapid heart rate/ demand ischemia - normalized as of 3/19   Hx of HYPERTENSION, BENIGN SYSTEMIC  not an active issue at this time   DVT Prophylaxis:xarelto  Code Status:  Disposition:CIR consult placed    Subjective: Feels okay, overnight had sundowning and pulled at his ileostomy  Objective: Weight change:   Intake/Output Summary (Last 24 hours) at 06/15/12 1618 Last data filed at 06/15/12 1514  Gross per 24 hour  Intake 1006.67 ml  Output   3750 ml  Net -2743.33 ml   Blood pressure 117/64, pulse 75, temperature 98.5 F (36.9 C), temperature source Oral, resp. rate 18, height 6\' 1"  (1.854  m), weight 88.4 kg (194 lb 14.2 oz), SpO2 99.00%.  Physical Exam: General: Alert and awake, oriented x3, not in any acute distress. CVS: S1-S2 clear, no murmur rubs or gallops Chest: clear to auscultation bilaterally, no wheezing, rales or rhonchi Abdomen: soft nontender,mild distended, ileostomy Extremities: no cyanosis, clubbing or edema noted bilaterally   Lab Results: Basic Metabolic Panel:  Recent Labs Lab 06/09/12 0555 06/10/12 0544  NA 141 138  K 3.8 4.6  CL 103 102  CO2 29 28  GLUCOSE 112* 114*  BUN 22 20  CREATININE 1.07 0.97  CALCIUM 8.6 9.1   Liver Function Tests:  Recent Labs Lab 06/09/12 0555  AST 27  ALT 18  ALKPHOS 144*  BILITOT 0.4  PROT 5.0*  ALBUMIN 2.2*   CBC:  Recent Labs Lab 06/11/12 0450 06/14/12 0526  WBC 9.2 8.7  HGB 10.8* 10.2*  HCT 31.6* 29.2*  MCV 95.8 95.4  PLT 309 338     Recent Labs Lab 06/13/12 1307  GLUCAP 99     Micro Results: Recent Results (from the past 240 hour(s))  SURGICAL PCR SCREEN     Status: None   Collection Time    06/13/12  7:04 AM      Result Value Range Status   MRSA, PCR NEGATIVE  NEGATIVE Final   Staphylococcus aureus NEGATIVE  NEGATIVE Final   Comment:            The Xpert SA Assay (FDA     approved for NASAL specimens  in patients over 44 years of age),     is one component of     a comprehensive surveillance     program.  Test performance has     been validated by [employer] for patients greater     than or equal to 77 year old.     It is not intended     to diagnose infection nor to     guide or monitor treatment.    Studies/Results: Dg Abd 1 View  06/01/2012  *RADIOLOGY REPORT*  Clinical Data: Obstruction  ABDOMEN - 1 VIEW  Comparison: 05/30/2012  Findings: Distended small bowel loops are scattered across the abdomen.  Minimal colonic gas.  NG tube coiled in the fundus of the stomach.  Phleboliths project over the pelvis.  No obvious free intraperitoneal gas. There are  small gas bubbles within the region of the wall of a bowel loop along the left lower quadrant.  The bubbles continue into the lumen of the bowel superiorly therefore it is not felt to represent pneumatosis.  IMPRESSION: Stable partial small bowel obstruction pattern.   Original Report Authenticated By: Jolaine Click, M.D.    Ct Head Wo Contrast  06/04/2012  *RADIOLOGY REPORT*  Clinical Data: New onset confusion and visual hallucinations. Prior history of stroke.  CT HEAD WITHOUT CONTRAST  Technique:  Contiguous axial images were obtained from the base of the skull through the vertex without contrast.  Comparison: Unenhanced cranial CT 04/17/2012, 03/12/2011.  MRI brain 03/12/2011.  Findings: Moderate cortical and deep atrophy, unchanged.  Moderate to severe changes of small vessel disease of the white matter,, including the brainstem, unchanged.  Old lacunar strokes in the deep white matter of the right frontal lobe and in the right inferior cerebellar hemisphere, unchanged.  No mass lesion.  No midline shift.  No acute hemorrhage or hematoma.  No extra-axial fluid collections.  No evidence of acute infarction.  No significant interval change.  Near complete opacification of the left maxillary sinus with thickening of the sinus walls.  Opacification of a middle right ethmoid air cell.  Remaining visualized paranasal sinuses, bilateral mastoid air cells, and both middle ear cavities well- aerated.  Bilateral carotid siphon atherosclerosis.  Calcified pannus posterior to the dens again noted.  IMPRESSION:  1.  No acute intracranial abnormality. 2.  Stable moderate generalized atrophy, moderate to severe chronic microvascular ischemic changes of the white matter, and old lacunar strokes in the right frontal lobe and right inferior cerebellar hemisphere. 3.  Chronic left maxillary sinusitis and minimal chronic right ethmoid sinusitis.   Original Report Authenticated By: Hulan Saas, M.D.    Ct Abdomen Pelvis W  Contrast  05/30/2012  *RADIOLOGY REPORT*  Clinical Data: Near-syncope.  Abdominal pain with bloating and diarrhea.  CT ABDOMEN AND PELVIS WITH CONTRAST  Technique:  Multidetector CT imaging of the abdomen and pelvis was performed following the standard protocol during bolus administration of intravenous contrast.  Contrast: 80mL OMNIPAQUE IOHEXOL 300 MG/ML  SOLN  Comparison: None.  Findings: Lung bases show dependent atelectasis bilaterally.  Heart size normal.  No pericardial effusion.  There may be trace left pleural fluid.  Distal esophagus is dilated and fluid-filled.  Intrahepatic biliary duct dilatation.  Extrahepatic bile duct is somewhat difficult to follow but is likely within normal limits. Intermediate attenuation material layers in the gallbladder.  Right kidney is unremarkable.  Low attenuation lesions in the left kidney measure up to 1.5 cm and are likely  cysts.  Spleen, pancreas, stomach and duodenum are unremarkable.  There is rather diffuse distention of fluid-filled small bowel, without a discrete transition point.  The cecum and proximal ascending colon are fluid filled and dilated as well.  However, there is a fairly long segment of under distention of the colon with noticeable wall thickening involving the distal ascending, transverse and proximal descending portions.  The distal descending and rectosigmoid colon are minimally dilated and contains fluid.  Moderate ascites.  Mesenteric edema.  No definite pathologically enlarged lymph nodes.  Atherosclerotic calcification of the arterial vasculature.  Prostate is enlarged.  No worrisome lytic or sclerotic lesions.  IMPRESSION:  1.  Long segment of nondistended and thickened colon, involving the ascending, transverse and descending portions, with diffuse fluid filled and dilated small bowel.  Ischemic and infectious/inflammatory etiologies involving the colon are considered, with resultant mechanical small bowel obstruction. Critical  Value/emergent results were called by telephone at the time of interpretation on 05/30/2012 at 1225 hours to Dr. Radford Pax, who verbally acknowledged these results. 2.  Tiny left pleural effusion and moderate ascites. 3.  Intrahepatic biliary duct dilatation, of uncertain etiology. 4.  Question gallbladder sludge. 5.  Prostate enlargement.   Original Report Authenticated By: Leanna Battles, M.D.    Dg Chest Port 1 View  06/06/2012  *RADIOLOGY REPORT*  Clinical Data: Shortness of breath.  Congestion.  PORTABLE CHEST - 1 VIEW  Comparison: 06/05/2012.  Findings: Tortuous aorta particularly involving the ascending aspect.  Cardiomegaly.  Pulmonary vascular congestion most notable centrally  Poor inspiration.  Crowding markings lung bases versus atelectasis. Subtle infiltrate would be difficult to exclude in this setting. Appearance without significant change.  Right lung apex not entirely included present exam.  No gross pneumothorax.  IMPRESSION: Pulmonary vascular congestion most notable centrally.  Poor inspiration with basilar atelectasis.  Tortuous aorta.  Cardiomegaly.   Original Report Authenticated By: Lacy Duverney, M.D.    Dg Chest Port 1 View  06/05/2012  *RADIOLOGY REPORT*  Clinical Data: Shortness of breath.  PORTABLE CHEST - 1 VIEW  Comparison: 05/31/2012  Findings: Shallow inspiration.  Bilateral pleural effusions with basilar atelectasis or infiltration bilaterally.  These changes are progressing since previous study.  Mild cardiac enlargement with borderline normal pulmonary vascularity.  No pneumothorax. Tortuous aorta.  IMPRESSION: Shallow inspiration with increasing bilateral pleural effusions and bilateral basilar atelectasis or infiltration.   Original Report Authenticated By: Burman Nieves, M.D.    Dg Chest Port 1 View  05/31/2012  *RADIOLOGY REPORT*  Clinical Data: Ascites.  Pleural effusion.  PORTABLE CHEST - 1 VIEW  Comparison: 05/10/2012.  Findings: Nasogastric tube gastric fundus  level.  Bibasilar atelectasis.  Poor inspiration.  Limited evaluation lung bases.  Central pulmonary vascular prominence.  Tortuous aorta.  No gross pneumothorax.  Heart size top normal.  IMPRESSION: Poor inspiration with bibasilar atelectatic changes.  Limited evaluation lung bases.  Calcified tortuous aorta.  Central pulmonary vascular prominence.   Original Report Authenticated By: Lacy Duverney, M.D.    Dg Swallowing Func-speech Pathology  06/07/2012  Riley Nearing Deblois, CCC-SLP     06/07/2012 10:39 AM Objective Swallowing Evaluation: Modified Barium Swallowing Study   Patient Details  Name: [name] [name] MRN: [id number] Date of Birth: 1930/07/11  Today's Date: 06/07/2012 Time: 0940-1005 SLP Time Calculation (min): 25 min  Past Medical History:  Past Medical History  Diagnosis Date  . Ramsay Hunt auricular syndrome   . HTN (hypertension)   . Osteoarthritis   . Dysrhythmia 04/17/12  . Anxiety   .  Stroke   . H/O Bell's palsy at age 68  . Atrial flutter   . Atrial fibrillation    Past Surgical History:  Past Surgical History  Procedure Laterality Date  . Hernia repair    . Vasectomy    . Prostate biopsies      per Vonita Moss  . Cataract extraction      SE eye  . Colonoscopy N/A 06/03/2012    Procedure: COLONOSCOPY;  Surgeon: Florencia Reasons, MD;   Location: [facility] ENDOSCOPY;  Service: Endoscopy;  Laterality: N/A;  . Esophagogastroduodenoscopy N/A 06/03/2012    Procedure: ESOPHAGOGASTRODUODENOSCOPY (EGD);  Surgeon: Florencia Reasons, MD;  Location: [facility] ENDOSCOPY;  Service: Endoscopy;   Laterality: N/A;   HPI:  79 yo WM with a pmh of stroke/caf on pradaxa who presented to  [name] ED 3-13 with 2 days of abd pain and distension. Admitted to  hospital and underwent EGD 3-17 and multiple bxs were obtained  the gastric and the colonic biopsies show malignancy, probable  adenocarcinoma. He developed increased wob and clinical findings  consistent with pulmonary edema(crackles) and was transferred to  ICU from SDU. He did not  tolerate NIMVS (claustrophobia) but  responded well to lasix and NTG. Note he has speech changes  (dysarthia and thick tongue) CT head 3-19 with no new changes.  Upper GI on 01/04/12 shows Moderate tertiary contractions in the  mid and distal esophagus, swallowing mechanism normal. Bedside  swallow revealed concerning signs of aspiration, objective test  needed.      Assessment / Plan / Recommendation Clinical Impression  Dysphagia Diagnosis: Mild oral phase dysphagia;Mild pharyngeal  phase dysphagia;Mild cervical esophageal phase  dysphagia;Suspected primary esophageal dysphagia Clinical impression: Pt demonstrates a mild oral, pharyngeal and  cervical esohageal phase dysphagia, which are likely all part of  pts baseline function. Pt has a baseline left lingual, labial  weakness resulting in mild anterior spillage on the left and oral  residuals. Pt is able to improve oral control by consuming  liquids with a straw place in the right side of the mouth. There  is a mild sensory motor pharyngeal deficits associated with  decreased base of tongue retraction and epiglottic deflection.  The swallow is delayed to the level of the pyriform sinuses with  thin liquids. Mild, unsensed residual remains post swallow with  liquids and solids likely due to weakness and limited passage of  bolus through UES. There was no penetration or aspiration  observed during this study.   Also of concern was the appearance of poor esophageal transit of  soldis and liquids. Stasis remains throughout the esophageal  column.  Pt is recommended to consume thin liquids, will defer to  surgery for diet textuere given GI pathology. Pt is physically  able to masticate regular solids but may need softer texture for  digestion. Offered pt and family compensatory strategies to clear  residuals, and esophageal precautions written and verbal. Pt and  wife verbalize and demonstrate understanding. As pt's speech is  much improved today, no further SLP f/u  needed at this time.     Treatment Recommendation  No treatment recommended at this time    Diet Recommendation Thin liquid   Liquid Administration via: Straw Medication Administration: Whole meds with liquid Supervision: Patient able to self feed;Full supervision/cueing  for compensatory strategies Compensations: Slow rate;Small sips/bites;Check for anterior  loss;Multiple dry swallows after each bite/sip;Follow solids with  liquid Postural Changes and/or Swallow Maneuvers: Seated upright 90  degrees;Upright 30-60 min  after meal    Other  Recommendations Oral Care Recommendations: Oral care BID   Follow Up Recommendations  None    Frequency and Duration        Pertinent Vitals/Pain NA    SLP Swallow Goals     General HPI: 79 yo WM with a pmh of stroke/caf on pradaxa who  presented to [facility] ED 3-13 with 2 days of abd pain and distension.  Admitted to hospital and underwent EGD 3-17 and multiple bxs were  obtained the gastric and the colonic biopsies show malignancy,  probable adenocarcinoma. He developed increased wob and clinical  findings consistent with pulmonary edema(crackles) and was  transferred to ICU from SDU. He did not tolerate NIMVS  (claustrophobia) but responded well to lasix and NTG. Note he has  speech changes (dysarthia and thick tongue) CT head 3-19 with no  new changes. Upper GI on 01/04/12 shows Moderate tertiary  contractions in the mid and distal esophagus, swallowing  mechanism normal. Bedside swallow revealed concerning signs of  aspiration, objective test needed.  Type of Study: Modified Barium Swallowing Study Reason for Referral: Objectively evaluate swallowing function Previous Swallow Assessment: noe Diet Prior to this Study: NPO Temperature Spikes Noted: No Respiratory Status: Room air History of Recent Intubation: No Behavior/Cognition: Cooperative;Pleasant mood;Lethargic Oral Cavity - Dentition: Adequate natural dentition Oral Motor / Sensory Function: Impaired - see Bedside swallow   eval Self-Feeding Abilities: Able to feed self Patient Positioning: Upright in chair Baseline Vocal Quality: Clear Volitional Cough: Strong Volitional Swallow: Able to elicit Anatomy: Other (Comment) (Appearance of osteophytic changes at  C5/6/7) Pharyngeal Secretions: Not observed secondary MBS    Reason for Referral Objectively evaluate swallowing function   Oral Phase Oral Preparation/Oral Phase Oral Phase: Impaired Oral - Thin Oral - Thin Cup: Left anterior bolus loss;Weak lingual  manipulation;Left pocketing in lateral sulci;Pocketing in  anterior sulcus;Lingual/palatal residue Oral - Thin Straw: Weak lingual manipulation;Lingual/palatal  residue Oral - Solids Oral - Puree: Weak lingual manipulation;Lingual/palatal residue Oral - Regular: Weak lingual manipulation;Lingual/palatal residue Oral - Pill: Lingual/palatal residue   Pharyngeal Phase Pharyngeal Phase Pharyngeal Phase: Impaired Pharyngeal - Thin Pharyngeal - Thin Cup: Delayed swallow initiation;Premature  spillage to pyriform sinuses;Reduced tongue base  retraction;Reduced epiglottic inversion;Pharyngeal residue -  valleculae;Pharyngeal residue - pyriform sinuses;Pharyngeal  residue - cp segment Pharyngeal - Thin Straw: Delayed swallow initiation;Premature  spillage to pyriform sinuses;Reduced tongue base  retraction;Reduced epiglottic inversion;Pharyngeal residue -  valleculae;Pharyngeal residue - pyriform sinuses;Pharyngeal  residue - cp segment Pharyngeal - Solids Pharyngeal - Puree: Delayed swallow initiation;Reduced tongue  base retraction;Reduced epiglottic inversion;Pharyngeal residue -  valleculae;Pharyngeal residue - pyriform sinuses Pharyngeal - Regular: Delayed swallow initiation;Reduced tongue  base retraction;Reduced epiglottic inversion;Pharyngeal residue -  valleculae;Pharyngeal residue - pyriform sinuses  Cervical Esophageal Phase    GO    Cervical Esophageal Phase Cervical Esophageal Phase: Impaired Cervical Esophageal Phase - Comment  Cervical Esophageal Comment: Due to apperance of impingement form  cervical spine at C5 to C7 there was reduced opening othe UES  with pild cervical esophageal residuals and 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 There was also mild residual in pyriform sinuses  due to decreased transit of bolus. No radiologist present to  confirm.          DeBlois, Riley Nearing 06/07/2012, 10:31 AM      Medications: Scheduled Meds: . diltiazem  120 mg Oral Daily  . feeding supplement  237 mL Oral TID WC  . pantoprazole  40 mg Oral BID AC  .  rivaroxaban  20 mg Oral Q supper  . sodium chloride  3 mL Intravenous Q12H      LOS: 16 days   Ivey Nembhard M.D. [facility] 06/15/2012, 4:18 PM Pager: [phone]  If 7PM-7AM, please contact night-coverage [email or web address] Password [email or web address]

## 2012-06-16 NOTE — Progress Notes (Signed)
Pt Noted to convert to rapid afib  Pt sitting in chair denies c/o assisted back to bed 12 done to confirm  Hr down to 90 bp 98./64 pt received daily dose of cardizem 120 po  Will continue to monitor

## 2012-06-16 NOTE — Progress Notes (Signed)
3 Days Post-Op   Assessment: s/p Procedure(s): EXPLORATORY LAPAROSCOPY  DIVERTING LOOP ILEOSTOMY LYMPH NODE BIOPSY Patient Active Problem List  Diagnosis  . ERECTILE DYSFUNCTION  . TOBACCO DEPENDENCE  . NEUROPATHY, IDIOPATHIC PERIPHERAL  . HYPERTENSION, BENIGN SYSTEMIC  . DIVERTICULOSIS OF COLON  . BPH  . SCIATICA, RIGHT  . METATARSALGIA  . NUMBNESS  . Left knee pain  . Knee effusion, left  . Chest pain  . CVA (cerebral infarction)  . HTN (hypertension), malignant  . TIA (transient ischemic attack)  . Atrial fibrillation  . Gastroparesis  . Atrial fibrillation, rapid  . GI bleed  . Abdominal distention  . Syncope  . SBO (small bowel obstruction)  . Diastolic CHF, acute  . Acute diastolic heart failure  . Acute respiratory distress  . Acute encephalopathy  . Dysarthria, acute  . Paroxysmal a-fib/flutter  . Hypokalemia  . Weakness generalized  . Pain, abdominal, generalized  . Neck stiffness  . Adenocarcinoma  . Ileostomy in place    Stable surgically  Plan: No changes today. Path pending  Subjective: Feels about the same as yesterday. No nausea, tolerating solid diet, not eating a lot, supplementing with ensure  Objective: Vital signs in last 24 hours: Temp:  [97.3 F (36.3 C)-98.5 F (36.9 C)] 97.3 F (36.3 C) (03/30 0546) Pulse Rate:  [72-78] 72 (03/30 0546) Resp:  [18] 18 (03/30 0546) BP: (98-125)/(64-70) 98/64 mmHg (03/30 0851) SpO2:  [94 %-99 %] 97 % (03/30 0546)   Intake/Output from previous day: 03/29 0701 - 03/30 0700 In: 720 [P.O.:720] Out: 3725 [Urine:3200; Stool:525]  General appearance: alert, cooperative, cachectic and no distress Resp: clear to auscultation bilaterally GI: soft, non-tender; bowel sounds normal; no masses,  no organomegaly and iliostomy healthy and functioning  Incision: healing well  Lab Results:   Recent Labs  06/14/12 0526  WBC 8.7  HGB 10.2*  HCT 29.2*  PLT 338   BMET No results found for this  basename: NA, K, CL, CO2, GLUCOSE, BUN, CREATININE, CALCIUM,  in the last 72 hours  MEDS, Scheduled . diltiazem  120 mg Oral Daily  . feeding supplement  237 mL Oral TID WC  . pantoprazole  40 mg Oral BID AC  . rivaroxaban  20 mg Oral Q supper  . sodium chloride  3 mL Intravenous Q12H    Studies/Results: No results found.    LOS: 17 days     Currie Paris, MD, [facility], [place] [phone]   06/16/2012 9:17 AM

## 2012-06-16 NOTE — Progress Notes (Signed)
   Primary cardiologist: Dr. Verne Carrow  Subjective:   Restless night. No palpitations or chest pain reported.   Objective:   Temp:  [97.3 F (36.3 C)-98.5 F (36.9 C)] 97.3 F (36.3 C) (03/30 0546) Pulse Rate:  [72-78] 72 (03/30 0546) Resp:  [18] 18 (03/30 0546) BP: (117-125)/(64-70) 117/70 mmHg (03/30 0546) SpO2:  [94 %-99 %] 97 % (03/30 0546) Last BM Date: 06/15/12 (functioning ileostomy/ brown liq stool)  Filed Weights   06/11/12 0400 06/12/12 0400 06/13/12 2333  Weight: 190 lb 7.6 oz (86.4 kg) 195 lb 15.8 oz (88.9 kg) 194 lb 14.2 oz (88.4 kg)    Intake/Output Summary (Last 24 hours) at 06/16/12 0738 Last data filed at 06/16/12 0549  Gross per 24 hour  Intake    720 ml  Output   3725 ml  Net  -3005 ml   Telemetry: Sinus rhythm.  Exam:  General: NAD.  Lungs: Clear,, nonlabored.  Cardiac: RRR.  Extremities: No pitting.  Lab Results:  Basic Metabolic Panel:  Recent Labs Lab 06/10/12 0544  NA 138  K 4.6  CL 102  CO2 28  GLUCOSE 114*  BUN 20  CREATININE 0.97  CALCIUM 9.1    CBC:  Recent Labs Lab 06/11/12 0450 06/14/12 0526  WBC 9.2 8.7  HGB 10.8* 10.2*  HCT 31.6* 29.2*  MCV 95.8 95.4  PLT 309 338     Medications:   Scheduled Medications: . diltiazem  120 mg Oral Daily  . feeding supplement  237 mL Oral TID WC  . pantoprazole  40 mg Oral BID AC  . rivaroxaban  20 mg Oral Q supper  . sodium chloride  3 mL Intravenous Q12H    Infusions: . sodium chloride 50 mL/hr at 06/16/12 0605    PRN Medications: acetaminophen, acetaminophen, morphine injection, morphine CONCENTRATE, ondansetron (ZOFRAN) IV   Assessment:   1. PAF, maintaining sinus rhythm, on Cardizem CD and Xarelto.  2. Stage 4 metastatic adenocardinoma of stomach. Status post diverting loop ileostomy with scalene node biopsy. Further plans per oncology/surgical teams.    Plan/Discussion:    Continue oral Cardizem CD and Xarelto. Heart rate and rhythm  stable.   Jonelle Sidle, M.D., F.A.C.C.

## 2012-06-16 NOTE — Progress Notes (Signed)
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  -colonoscopy/EGD unremarkable on direct visualization but unfortunately path returned positive for metastatic AdenoCA of stomach  -Appreciate Oncology assistance, will follow on 3/31 (Dr Myrle Sheng)  -CCS /post lap loop ieostomy, LN biopsy and peritoneal fluid sampling -PET scan as OP   Hematemesis / gastric CA / anorexia -no evidence of bleeding on Xarelto (chosen for decreased risk of GIB) at this time,CCS has approved resume post op   Acute delirium / TIA vs CVA / acute dysarthria: worse at night, currently at bbaseline   -CT Head negative - consulted Neuro  -had apparent admit for CVA Jan 2014 in [facility] and was also admitted to [facility] Jan 2014 for expressive aphasia   Diarrhea  -resolved in absence of significant intake   Rapid atrial fibrillation/flutter  - currently tolerating oral Cardizem 120mg  daily, patient had episode of rapid afib transient while eating now stable   Elevated cardiac enzymes  -Likely due to rapid heart rate/ demand ischemia - normalized as of 3/19   Hx of HYPERTENSION, BENIGN SYSTEMIC  not an active issue at this time   DVT Prophylaxis:xarelto  Code Status:  Disposition: CIR consult placed, patient and his wife patiently waiting for their decision tomorrow    Subjective: Feels okay, ate better today, wife at the bedside,   Objective: Weight change:   Intake/Output Summary (Last 24 hours) at 06/16/12 1118 Last data filed at 06/16/12 0549  Gross per 24 hour  Intake    480 ml  Output   3725 ml  Net  -3245 ml   Blood pressure 98/64, pulse 72, temperature 97.3 F (36.3 C), temperature source Oral, resp. rate  18, height 6\' 1"  (1.854 m), weight 88.4 kg (194 lb 14.2 oz), SpO2 97.00%.  Physical Exam: General: A x O x 3 CVS: S1-S2 clear, ireg Chest: CTAB Abdomen: soft nontender,mild distended, ileostomy Extremities: no c/c/e bilaterally   Lab Results: Basic Metabolic Panel:  Recent Labs Lab 06/10/12 0544  NA 138  K 4.6  CL 102  CO2 28  GLUCOSE 114*  BUN 20  CREATININE 0.97  CALCIUM 9.1   Liver Function Tests: No results found for this basename: AST, ALT, ALKPHOS, BILITOT, PROT, ALBUMIN,  in the last 168 hours CBC:  Recent Labs Lab 06/11/12 0450 06/14/12 0526  WBC 9.2 8.7  HGB 10.8* 10.2*  HCT 31.6* 29.2*  MCV 95.8 95.4  PLT 309 338     Recent Labs Lab 06/13/12 1307  GLUCAP 99     Micro Results: Recent Results (from the past 240 hour(s))  SURGICAL PCR SCREEN     Status: None   Collection Time    06/13/12  7:04 AM      Result Value Range Status   MRSA, PCR NEGATIVE  NEGATIVE Final   Staphylococcus aureus NEGATIVE  NEGATIVE Final   Comment:            The Xpert SA Assay (FDA     approved for NASAL specimens     in patients over 86 years of age),     is one component of     a comprehensive surveillance     program.  Test  performance has     been validated by [employer] for patients greater     than or equal to 16 year old.     It is not intended     to diagnose infection nor to     guide or monitor treatment.    Studies/Results: Dg Abd 1 View  06/01/2012  *RADIOLOGY REPORT*  Clinical Data: Obstruction  ABDOMEN - 1 VIEW  Comparison: 05/30/2012  Findings: Distended small bowel loops are scattered across the abdomen.  Minimal colonic gas.  NG tube coiled in the fundus of the stomach.  Phleboliths project over the pelvis.  No obvious free intraperitoneal gas. There are small gas bubbles within the region of the wall of a bowel loop along the left lower quadrant.  The bubbles continue into the lumen of the bowel superiorly therefore it is not felt to  represent pneumatosis.  IMPRESSION: Stable partial small bowel obstruction pattern.   Original Report Authenticated By: Jolaine Click, M.D.    Ct Head Wo Contrast  06/04/2012  *RADIOLOGY REPORT*  Clinical Data: New onset confusion and visual hallucinations. Prior history of stroke.  CT HEAD WITHOUT CONTRAST  Technique:  Contiguous axial images were obtained from the base of the skull through the vertex without contrast.  Comparison: Unenhanced cranial CT 04/17/2012, 03/12/2011.  MRI brain 03/12/2011.  Findings: Moderate cortical and deep atrophy, unchanged.  Moderate to severe changes of small vessel disease of the white matter,, including the brainstem, unchanged.  Old lacunar strokes in the deep white matter of the right frontal lobe and in the right inferior cerebellar hemisphere, unchanged.  No mass lesion.  No midline shift.  No acute hemorrhage or hematoma.  No extra-axial fluid collections.  No evidence of acute infarction.  No significant interval change.  Near complete opacification of the left maxillary sinus with thickening of the sinus walls.  Opacification of a middle right ethmoid air cell.  Remaining visualized paranasal sinuses, bilateral mastoid air cells, and both middle ear cavities well- aerated.  Bilateral carotid siphon atherosclerosis.  Calcified pannus posterior to the dens again noted.  IMPRESSION:  1.  No acute intracranial abnormality. 2.  Stable moderate generalized atrophy, moderate to severe chronic microvascular ischemic changes of the white matter, and old lacunar strokes in the right frontal lobe and right inferior cerebellar hemisphere. 3.  Chronic left maxillary sinusitis and minimal chronic right ethmoid sinusitis.   Original Report Authenticated By: Hulan Saas, M.D.    Ct Abdomen Pelvis W Contrast  05/30/2012  *RADIOLOGY REPORT*  Clinical Data: Near-syncope.  Abdominal pain with bloating and diarrhea.  CT ABDOMEN AND PELVIS WITH CONTRAST  Technique:  Multidetector CT  imaging of the abdomen and pelvis was performed following the standard protocol during bolus administration of intravenous contrast.  Contrast: 80mL OMNIPAQUE IOHEXOL 300 MG/ML  SOLN  Comparison: None.  Findings: Lung bases show dependent atelectasis bilaterally.  Heart size normal.  No pericardial effusion.  There may be trace left pleural fluid.  Distal esophagus is dilated and fluid-filled.  Intrahepatic biliary duct dilatation.  Extrahepatic bile duct is somewhat difficult to follow but is likely within normal limits. Intermediate attenuation material layers in the gallbladder.  Right kidney is unremarkable.  Low attenuation lesions in the left kidney measure up to 1.5 cm and are likely cysts.  Spleen, pancreas, stomach and duodenum are unremarkable.  There is rather diffuse distention of fluid-filled small bowel, without a discrete transition point.  The cecum and proximal  ascending colon are fluid filled and dilated as well.  However, there is a fairly long segment of under distention of the colon with noticeable wall thickening involving the distal ascending, transverse and proximal descending portions.  The distal descending and rectosigmoid colon are minimally dilated and contains fluid.  Moderate ascites.  Mesenteric edema.  No definite pathologically enlarged lymph nodes.  Atherosclerotic calcification of the arterial vasculature.  Prostate is enlarged.  No worrisome lytic or sclerotic lesions.  IMPRESSION:  1.  Long segment of nondistended and thickened colon, involving the ascending, transverse and descending portions, with diffuse fluid filled and dilated small bowel.  Ischemic and infectious/inflammatory etiologies involving the colon are considered, with resultant mechanical small bowel obstruction. Critical Value/emergent results were called by telephone at the time of interpretation on 05/30/2012 at 1225 hours to Dr. Radford Pax, who verbally acknowledged these results. 2.  Tiny left pleural effusion  and moderate ascites. 3.  Intrahepatic biliary duct dilatation, of uncertain etiology. 4.  Question gallbladder sludge. 5.  Prostate enlargement.   Original Report Authenticated By: Leanna Battles, M.D.    Dg Chest Port 1 View  06/06/2012  *RADIOLOGY REPORT*  Clinical Data: Shortness of breath.  Congestion.  PORTABLE CHEST - 1 VIEW  Comparison: 06/05/2012.  Findings: Tortuous aorta particularly involving the ascending aspect.  Cardiomegaly.  Pulmonary vascular congestion most notable centrally  Poor inspiration.  Crowding markings lung bases versus atelectasis. Subtle infiltrate would be difficult to exclude in this setting. Appearance without significant change.  Right lung apex not entirely included present exam.  No gross pneumothorax.  IMPRESSION: Pulmonary vascular congestion most notable centrally.  Poor inspiration with basilar atelectasis.  Tortuous aorta.  Cardiomegaly.   Original Report Authenticated By: Lacy Duverney, M.D.    Dg Chest Port 1 View  06/05/2012  *RADIOLOGY REPORT*  Clinical Data: Shortness of breath.  PORTABLE CHEST - 1 VIEW  Comparison: 05/31/2012  Findings: Shallow inspiration.  Bilateral pleural effusions with basilar atelectasis or infiltration bilaterally.  These changes are progressing since previous study.  Mild cardiac enlargement with borderline normal pulmonary vascularity.  No pneumothorax. Tortuous aorta.  IMPRESSION: Shallow inspiration with increasing bilateral pleural effusions and bilateral basilar atelectasis or infiltration.   Original Report Authenticated By: Burman Nieves, M.D.    Dg Chest Port 1 View  05/31/2012  *RADIOLOGY REPORT*  Clinical Data: Ascites.  Pleural effusion.  PORTABLE CHEST - 1 VIEW  Comparison: 05/10/2012.  Findings: Nasogastric tube gastric fundus level.  Bibasilar atelectasis.  Poor inspiration.  Limited evaluation lung bases.  Central pulmonary vascular prominence.  Tortuous aorta.  No gross pneumothorax.  Heart size top normal.   IMPRESSION: Poor inspiration with bibasilar atelectatic changes.  Limited evaluation lung bases.  Calcified tortuous aorta.  Central pulmonary vascular prominence.   Original Report Authenticated By: Lacy Duverney, M.D.    Dg Swallowing Func-speech Pathology  06/07/2012  Riley Nearing Deblois, CCC-SLP     06/07/2012 10:39 AM Objective Swallowing Evaluation: Modified Barium Swallowing Study   Patient Details  Name: MIKLOS BIDINGER MRN: [id number] Date of Birth: 22-Jan-1931  Today's Date: 06/07/2012 Time: 0940-1005 SLP Time Calculation (min): 25 min  Past Medical History:  Past Medical History  Diagnosis Date  . Ramsay Hunt auricular syndrome   . HTN (hypertension)   . Osteoarthritis   . Dysrhythmia 04/17/12  . Anxiety   . Stroke   . H/O Bell's palsy at age 51  . Atrial flutter   . Atrial fibrillation    Past Surgical History:  Past Surgical History  Procedure Laterality Date  . Hernia repair    . Vasectomy    . Prostate biopsies      per Vonita Moss  . Cataract extraction      SE eye  . Colonoscopy N/A 06/03/2012    Procedure: COLONOSCOPY;  Surgeon: Florencia Reasons, MD;   Location: [facility] ENDOSCOPY;  Service: Endoscopy;  Laterality: N/A;  . Esophagogastroduodenoscopy N/A 06/03/2012    Procedure: ESOPHAGOGASTRODUODENOSCOPY (EGD);  Surgeon: Florencia Reasons, MD;  Location: [facility] ENDOSCOPY;  Service: Endoscopy;   Laterality: N/A;   HPI:  79 yo WM with a pmh of stroke/caf on pradaxa who presented to  [facility] ED 3-13 with 2 days of abd pain and distension. Admitted to  hospital and underwent EGD 3-17 and multiple bxs were obtained  the gastric and the colonic biopsies show malignancy, probable  adenocarcinoma. He developed increased wob and clinical findings  consistent with pulmonary edema(crackles) and was transferred to  ICU from SDU. He did not tolerate NIMVS (claustrophobia) but  responded well to lasix and NTG. Note he has speech changes  (dysarthia and thick tongue) CT head 3-19 with no new changes.  Upper GI on 01/04/12 shows  Moderate tertiary contractions in the  mid and distal esophagus, swallowing mechanism normal. Bedside  swallow revealed concerning signs of aspiration, objective test  needed.      Assessment / Plan / Recommendation Clinical Impression  Dysphagia Diagnosis: Mild oral phase dysphagia;Mild pharyngeal  phase dysphagia;Mild cervical esophageal phase  dysphagia;Suspected primary esophageal dysphagia Clinical impression: Pt demonstrates a mild oral, pharyngeal and  cervical esohageal phase dysphagia, which are likely all part of  pts baseline function. Pt has a baseline left lingual, labial  weakness resulting in mild anterior spillage on the left and oral  residuals. Pt is able to improve oral control by consuming  liquids with a straw place in the right side of the mouth. There  is a mild sensory motor pharyngeal deficits associated with  decreased base of tongue retraction and epiglottic deflection.  The swallow is delayed to the level of the pyriform sinuses with  thin liquids. Mild, unsensed residual remains post swallow with  liquids and solids likely due to weakness and limited passage of  bolus through UES. There was no penetration or aspiration  observed during this study.   Also of concern was the appearance of poor esophageal transit of  soldis and liquids. Stasis remains throughout the esophageal  column.  Pt is recommended to consume thin liquids, will defer to  surgery for diet textuere given GI pathology. Pt is physically  able to masticate regular solids but may need softer texture for  digestion. Offered pt and family compensatory strategies to clear  residuals, and esophageal precautions written and verbal. Pt and  wife verbalize and demonstrate understanding. As pt's speech is  much improved today, no further SLP f/u needed at this time.     Treatment Recommendation  No treatment recommended at this time    Diet Recommendation Thin liquid   Liquid Administration via: Straw Medication Administration:  Whole meds with liquid Supervision: Patient able to self feed;Full supervision/cueing  for compensatory strategies Compensations: Slow rate;Small sips/bites;Check for anterior  loss;Multiple dry swallows after each bite/sip;Follow solids with  liquid Postural Changes and/or Swallow Maneuvers: Seated upright 90  degrees;Upright 30-60 min after meal    Other  Recommendations Oral Care Recommendations: Oral care BID   Follow Up Recommendations  None    Frequency and  Duration        Pertinent Vitals/Pain NA    SLP Swallow Goals     General HPI: 79 yo WM with a pmh of stroke/caf on pradaxa who  presented to [facility] ED 3-13 with 2 days of abd pain and distension.  Admitted to hospital and underwent EGD 3-17 and multiple bxs were  obtained the gastric and the colonic biopsies show malignancy,  probable adenocarcinoma. He developed increased wob and clinical  findings consistent with pulmonary edema(crackles) and was  transferred to ICU from SDU. He did not tolerate NIMVS  (claustrophobia) but responded well to lasix and NTG. Note he has  speech changes (dysarthia and thick tongue) CT head 3-19 with no  new changes. Upper GI on 01/04/12 shows Moderate tertiary  contractions in the mid and distal esophagus, swallowing  mechanism normal. Bedside swallow revealed concerning signs of  aspiration, objective test needed.  Type of Study: Modified Barium Swallowing Study Reason for Referral: Objectively evaluate swallowing function Previous Swallow Assessment: noe Diet Prior to this Study: NPO Temperature Spikes Noted: No Respiratory Status: Room air History of Recent Intubation: No Behavior/Cognition: Cooperative;Pleasant mood;Lethargic Oral Cavity - Dentition: Adequate natural dentition Oral Motor / Sensory Function: Impaired - see Bedside swallow  eval Self-Feeding Abilities: Able to feed self Patient Positioning: Upright in chair Baseline Vocal Quality: Clear Volitional Cough: Strong Volitional Swallow: Able to elicit Anatomy:  Other (Comment) (Appearance of osteophytic changes at  C5/6/7) Pharyngeal Secretions: Not observed secondary MBS    Reason for Referral Objectively evaluate swallowing function   Oral Phase Oral Preparation/Oral Phase Oral Phase: Impaired Oral - Thin Oral - Thin Cup: Left anterior bolus loss;Weak lingual  manipulation;Left pocketing in lateral sulci;Pocketing in  anterior sulcus;Lingual/palatal residue Oral - Thin Straw: Weak lingual manipulation;Lingual/palatal  residue Oral - Solids Oral - Puree: Weak lingual manipulation;Lingual/palatal residue Oral - Regular: Weak lingual manipulation;Lingual/palatal residue Oral - Pill: Lingual/palatal residue   Pharyngeal Phase Pharyngeal Phase Pharyngeal Phase: Impaired Pharyngeal - Thin Pharyngeal - Thin Cup: Delayed swallow initiation;Premature  spillage to pyriform sinuses;Reduced tongue base  retraction;Reduced epiglottic inversion;Pharyngeal residue -  valleculae;Pharyngeal residue - pyriform sinuses;Pharyngeal  residue - cp segment Pharyngeal - Thin Straw: Delayed swallow initiation;Premature  spillage to pyriform sinuses;Reduced tongue base  retraction;Reduced epiglottic inversion;Pharyngeal residue -  valleculae;Pharyngeal residue - pyriform sinuses;Pharyngeal  residue - cp segment Pharyngeal - Solids Pharyngeal - Puree: Delayed swallow initiation;Reduced tongue  base retraction;Reduced epiglottic inversion;Pharyngeal residue -  valleculae;Pharyngeal residue - pyriform sinuses Pharyngeal - Regular: Delayed swallow initiation;Reduced tongue  base retraction;Reduced epiglottic inversion;Pharyngeal residue -  valleculae;Pharyngeal residue - pyriform sinuses  Cervical Esophageal Phase    GO    Cervical Esophageal Phase Cervical Esophageal Phase: Impaired Cervical Esophageal Phase - Comment Cervical Esophageal Comment: Due to apperance of impingement form  cervical spine at C5 to C7 there was reduced opening othe UES  with pild cervical esophageal residuals and 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 There was also mild residual in pyriform sinuses  due to decreased transit of bolus. No radiologist present to  confirm.          DeBlois, Riley Nearing 06/07/2012, 10:31 AM      Medications: Scheduled Meds: . diltiazem  120 mg Oral Daily  . feeding supplement  237 mL Oral TID WC  . pantoprazole  40 mg Oral BID AC  . rivaroxaban  20 mg Oral Q supper  . sodium chloride  3 mL Intravenous Q12H      LOS: 17 days  Emillee Talsma M.D. [facility] 06/16/2012, 11:18 AM Pager: [phone]  If 7PM-7AM, please contact night-coverage [email or web address] Password [email or web address]

## 2012-06-17 ENCOUNTER — Inpatient Hospital Stay (HOSPITAL_COMMUNITY): Payer: Medicare Other

## 2012-06-17 ENCOUNTER — Other Ambulatory Visit: Payer: Self-pay | Admitting: *Deleted

## 2012-06-17 ENCOUNTER — Telehealth (INDEPENDENT_AMBULATORY_CARE_PROVIDER_SITE_OTHER): Payer: Self-pay | Admitting: General Surgery

## 2012-06-17 ENCOUNTER — Encounter (HOSPITAL_COMMUNITY): Payer: Self-pay | Admitting: Surgery

## 2012-06-17 DIAGNOSIS — C169 Malignant neoplasm of stomach, unspecified: Secondary | ICD-10-CM

## 2012-06-17 DIAGNOSIS — D638 Anemia in other chronic diseases classified elsewhere: Secondary | ICD-10-CM | POA: Diagnosis present

## 2012-06-17 DIAGNOSIS — G92 Toxic encephalopathy: Secondary | ICD-10-CM

## 2012-06-17 LAB — CBC
MCH: 32.9 pg (ref 26.0–34.0)
MCHC: 34.9 g/dL (ref 30.0–36.0)
Platelets: 363 10*3/uL (ref 150–400)
RBC: 3.1 MIL/uL — ABNORMAL LOW (ref 4.22–5.81)

## 2012-06-17 LAB — GLUCOSE, CAPILLARY: Glucose-Capillary: 109 mg/dL — ABNORMAL HIGH (ref 70–99)

## 2012-06-17 MED ORDER — VITAMIN C 500 MG PO TABS
500.0000 mg | ORAL_TABLET | Freq: Two times a day (BID) | ORAL | Status: DC
  Administered 2012-06-17 – 2012-06-18 (×2): 500 mg via ORAL
  Filled 2012-06-17 (×4): qty 1

## 2012-06-17 MED ORDER — LOPERAMIDE HCL 2 MG PO CAPS: 2.0000 mg | ORAL_CAPSULE | Freq: Three times a day (TID) | ORAL | Status: DC | PRN

## 2012-06-17 MED ORDER — BISMUTH SUBSALICYLATE 262 MG/15ML PO SUSP
30.0000 mL | Freq: Three times a day (TID) | ORAL | Status: DC | PRN
  Filled 2012-06-17: qty 236

## 2012-06-17 MED ORDER — GERHARDT'S BUTT CREAM
TOPICAL_CREAM | Freq: Two times a day (BID) | CUTANEOUS | Status: DC
  Administered 2012-06-17 – 2012-06-18 (×3): via TOPICAL
  Filled 2012-06-17: qty 1

## 2012-06-17 MED ORDER — FERROUS SULFATE 325 (65 FE) MG PO TABS
325.0000 mg | ORAL_TABLET | Freq: Two times a day (BID) | ORAL | Status: DC
  Administered 2012-06-17 – 2012-06-18 (×2): 325 mg via ORAL
  Filled 2012-06-17 (×4): qty 1

## 2012-06-17 MED ORDER — MORPHINE SULFATE 2 MG/ML IJ SOLN: 2.0000 mg | INTRAMUSCULAR | Status: DC | PRN

## 2012-06-17 MED ORDER — NALOXONE HCL 0.4 MG/ML IJ SOLN: 0.4000 mg | Freq: Once | INTRAMUSCULAR | Status: DC

## 2012-06-17 MED ORDER — WHITE PETROLATUM GEL
Status: AC
  Filled 2012-06-17: qty 5

## 2012-06-17 MED ORDER — MORPHINE SULFATE (CONCENTRATE) 10 MG /0.5 ML PO SOLN: 5.0000 mg | ORAL | Status: DC | PRN

## 2012-06-17 MED ORDER — BIOTENE DRY MOUTH MT LIQD
15.0000 mL | Freq: Two times a day (BID) | OROMUCOSAL | Status: DC
  Administered 2012-06-17 – 2012-06-18 (×3): 15 mL via OROMUCOSAL

## 2012-06-17 NOTE — Consult Note (Addendum)
WOC ostomy follow up Stoma type/location: RLQ, loop colostomy Stomal assessment/size: 1 3/4 round with rod support in place Peristomal assessment: intact, some mild erythema at the distal edges of the wafer, per wife pt has pulled pouch off several times during the weekend which has allowed output to leak on skin. Treatment options for stomal/peristomal skin:  Will add 2" barrier to mold around rod for better seal with next pouch change Output  Ostomy pouching: 2pc. 2 3/4" cut to fit around lateral portion of rod, pouched over the rod on the medial side, may need to adjust this with next pouch change.  Education provided: demonstrated pouch change with wife, she is very willing to learn. Will provide another session later in the week with her.   Wife mentioned some redness of the patients buttocks, he had a rectal pouch at one time to control diarhea,  Now irritated, appears to have some MASD (moisture associated skin damage), some peeling but the skin remains intact, bilateral buttocks in the skin fold and around the anus.  Will add some skin barrier cream for this.  WOC will follow along with you for ostomy education and support.  Tagen Brethauer [place], [place] [phone]

## 2012-06-17 NOTE — Progress Notes (Signed)
NUTRITION FOLLOW UP  Intervention:   1.  Supplements; continue Ensure Complete TID with meals and Magic cups.  Discussed progress and the potential need to add additional supplements if intake plateaus or pt experiences wt loss.  Nutrition Dx:   Inadequate oral intake related to pain and decreased appetite 2/2 gastropariesis as evidenced by 13% weight loss in 6 weeks. Ongoing   Monitoring:   1.  Food/Beverage; diet advancement once medically appropriate. Continue supplements to support intake.   Assessment:   Pt admitted with coffee-ground emesis and abdominal distention, found to have SBO.    Pt was NPO/CL x7 days on admission as he was thought to have ischemic colitis with ileus per endoscopy/colonoscopy. Pt previously had a NG tube for suction, but since pt pulled it out. Biopsy (3/19)revealed stomach cancer metastatic to the colon and peritoneum. This cancer is treatable but not curable. New dx of Stage IV stomach cancer.  Pt followed by PMT for support, but pt continues with aggressive care.  Pt s/p colectomy with ostomy placement which is functioning well.  Output remains appropriate.  Awaiting pathology results.   Pt reports improvement in intake up to 75% of meals and is taking Ensure Complete BID.  Pt with difficulty taking >2 supplements/day. Pt agrees intake remains suboptimal, but states he is progressing daily.  RD encouraged high calorie, high protein foods and a minimum of 2 supplements daily. Pt agreeable.   Plan for CIR soon. Pt is highly motivated and states frustration with current mobility status.    Height: Ht Readings from Last 1 Encounters:  06/11/12 6\' 1"  (1.854 m)    Weight Status:   Wt Readings from Last 1 Encounters:  06/13/12 194 lb 14.2 oz (88.4 kg)  05/30/12 187 lbs   Re-estimated needs:  Kcal: 1950- 2150 kcal  Protein: 105 0 120 gm  Fluid: >/= 2 L   Skin: intact  Diet Order: General   Intake/Output Summary (Last 24 hours) at 06/17/12  1303 Last data filed at 06/17/12 1047  Gross per 24 hour  Intake    123 ml  Output   1250 ml  Net  -1127 ml    Last BM: 06/17/2012   Labs:  No results found for this basename: NA, K, CL, CO2, BUN, CREATININE, CALCIUM, MG, PHOS, GLUCOSE,  in the last 168 hours  Sodium  Date/Time Value Range Status  06/10/2012  5:44 AM 138  135 - 145 mEq/L Final  06/09/2012  5:55 AM 141  135 - 145 mEq/L Final  06/08/2012  5:35 AM 138  135 - 145 mEq/L Final    Potassium  Date/Time Value Range Status  06/10/2012  5:44 AM 4.6  3.5 - 5.1 mEq/L Final     DELTA CHECK NOTED     SLIGHT HEMOLYSIS  06/09/2012  5:55 AM 3.8  3.5 - 5.1 mEq/L Final  06/08/2012  5:35 AM 3.5  3.5 - 5.1 mEq/L Final    Phosphorus  Date/Time Value Range Status  06/07/2012  4:05 AM 2.9  2.3 - 4.6 mg/dL Final  1/61/0960  4:54 AM 2.6  2.3 - 4.6 mg/dL Final  09/81/1914  7:82 PM 3.0  2.3 - 4.6 mg/dL Final    Magnesium  Date/Time Value Range Status  06/07/2012  4:05 AM 1.9  1.5 - 2.5 mg/dL Final  9/56/2130  8:65 AM 1.7  1.5 - 2.5 mg/dL Final  7/84/6962  9:52 AM 1.7  1.5 - 2.5 mg/dL Final      CBG (last  3)  No results found for this basename: GLUCAP,  in the last 72 hours  Scheduled Meds: . antiseptic oral rinse  15 mL Mouth Rinse BID  . diltiazem  120 mg Oral Daily  . feeding supplement  237 mL Oral TID WC  . Gerhardt's butt cream   Topical BID  . pantoprazole  40 mg Oral BID AC  . rivaroxaban  20 mg Oral Q supper  . sodium chloride  3 mL Intravenous Q12H  . white petrolatum        Continuous Infusions: . sodium chloride 50 mL/hr at 06/17/12 0125    Loyce Dys, MS RD LDN Clinical Inpatient Dietitian Pager: [phone] Weekend/After hours pager: [phone]

## 2012-06-17 NOTE — Telephone Encounter (Signed)
I left a message for this patient  To call office for his po f/u appt

## 2012-06-17 NOTE — Progress Notes (Signed)
Called as second set of eyes for patient with decreased LOC.  On arrival patient awake and alert - neuro intact - states he heard people call his name but was exhausted.  Pale.  Hot to touch on chest and abdomen - periphery cool.  No mottling noted.  Patient states he had a few chills.  Oral temp 99.5. Mouth breathing - oral mucosa dry.  Rectal temp 101. 8.  Patient started on liquid MSO4 today - family member states he does not do well with MSO4 - has some delirium when taking that - RN Okey Regal to speak with MD about med change. 128/64 HR 82 SSR - O2 97% on 2 liter nasal cannula.  Some fine rhonchi LLL - does IS well according to family. NAD noted.  RN to call MD with update.  Call as needed.

## 2012-06-17 NOTE — Consult Note (Signed)
Physical Medicine and Rehabilitation Consult Reason for Consult: Deconditioning Referring Physician:  Dr. Isidoro Donning   HPI: [name] [name] is a 79 y.o. male with h/o CVA 12/12 and recently 1/14, CAF on pradaxa, h/o of gastritis who was admitted on 05/30/12 with A Fib with RVR and GIB. CT suggestive of ischemic colitis and ileus with diffuse ascites. Started on IV cardizem for HR control. EGD with biopsy done positive for gastric CA with mets to colon. He did develop respiratory failure due to pulmonary edema and symptoms improved with diuresis. Dr. Darnelle Catalan consulted for input on stage 4 stomach cancer and treatment options discussed with recommendations for input from palliative care. On 03/21, Neurology consulted due to delirium with visual hallucinations and as CT negative recommended MRI brain for workup once stable. Patient elected to undergo exp lap with diverting loop ileostomy on 06/13/12 by Dr. Janee Morn.  Post op Xarelto initiated. Mentation continues to fluctuate with confusion at night. Tolerating po's and atrial fibrillation controlled on po Cardizem.  Therapies ongoing and recommending CIR for progression.    Review of Systems  HENT: Positive for neck pain.   Eyes: Negative for blurred vision and double vision.  Respiratory: Positive for shortness of breath. Negative for cough.   Cardiovascular: Negative for chest pain and palpitations.  Gastrointestinal: Positive for heartburn. Negative for nausea, vomiting and abdominal pain.  Musculoskeletal: Positive for myalgias.  Neurological: Positive for weakness. Negative for headaches.   Past Medical History  Diagnosis Date  . Ramsay Hunt auricular syndrome   . HTN (hypertension)   . Osteoarthritis   . Dysrhythmia 04/17/12  . Anxiety   . Stroke   . H/O Bell's palsy at age 80  . Atrial flutter   . Atrial fibrillation    Past Surgical History  Procedure Laterality Date  . Hernia repair    . Vasectomy    . Prostate biopsies      per  Vonita Moss  . Cataract extraction      SE eye  . Colonoscopy N/A 06/03/2012    Procedure: COLONOSCOPY;  Surgeon: Florencia Reasons, MD;  Location: [facility] ENDOSCOPY;  Service: Endoscopy;  Laterality: N/A;  . Esophagogastroduodenoscopy N/A 06/03/2012    Procedure: ESOPHAGOGASTRODUODENOSCOPY (EGD);  Surgeon: Florencia Reasons, MD;  Location: [facility] ENDOSCOPY;  Service: Endoscopy;  Laterality: N/A;  . Laparoscopy N/A 06/13/2012    Procedure: EXPLORATORY LAPAROSCOPY ;  Surgeon: Liz Malady, MD;  Location: [facility] OR;  Service: General;  Laterality: N/A;  . Diverting ileostomy N/A 06/13/2012    Procedure: DIVERTING LOOP ILEOSTOMY;  Surgeon: Liz Malady, MD;  Location: [facility] OR;  Service: General;  Laterality: N/A;  . Lymph node biopsy Left 06/13/2012    Procedure: LYMPH NODE BIOPSY;  Surgeon: Liz Malady, MD;  Location: [facility] OR;  Service: General;  Laterality: Left;  [place] Lymph Node Biopsy   Family History  Problem Relation Age of Onset  . Cancer Father   . Diabetes Father    Social History:  Married. Retired from [employer]. Active-walks daily and goes to the gym 5  days/wk. Wife retired recently and can provide supervision past discharge. Per reports that he quit smoking about 28 years ago. He has never used smokeless tobacco. He reports that he drinks about 6 ounces of wine daily. He reports that he does not use illicit drugs.   Allergies  Allergen Reactions  . Penicillins Rash    Rash appeared on palms of hands and feet.   Medications Prior to Admission  Medication Sig Dispense Refill  . b complex vitamins capsule Take 1 capsule by mouth daily.      . Cholecalciferol (VITAMIN D-3 PO) Take 1 tablet by mouth daily.       . dabigatran (PRADAXA) 150 MG CAPS Take 1 capsule (150 mg total) by mouth every 12 (twelve) hours.  60 capsule  2  . diltiazem (CARDIZEM CD) 120 MG 24 hr capsule Take 120 mg by mouth every morning.       . metoCLOPramide (REGLAN) 5 MG tablet Take 5 mg by mouth 3  (three) times daily before meals.       . Multiple Vitamins-Minerals (PRESERVISION AREDS PO) Take 1 tablet by mouth 2 (two) times daily.      . Omega-3 Fatty Acids (FISH OIL) 1000 MG CAPS Take 1 capsule by mouth daily.       Marland Kitchen omeprazole (PRILOSEC) 20 MG capsule Take 1 capsule by mouth 2 (two) times daily.       . rosuvastatin (CRESTOR) 10 MG tablet Take 10 mg by mouth daily.      . silodosin (RAPAFLO) 8 MG CAPS capsule Take 8 mg by mouth daily with breakfast.      . sucralfate (CARAFATE) 1 G tablet Take 1 g by mouth 3 (three) times daily before meals.         Home: Home Living Lives With: Spouse Available Help at Discharge: Available 24 hours/day Type of Home: House Home Access: Stairs to enter [employer] of Steps: 3 Home Layout: Two level;Able to live on main level with bedroom/bathroom Alternate Level Stairs-Number of Steps: can stay on main Bathroom Shower/Tub: Health visitor: Standard Home Adaptive Equipment: None  Functional History: Prior Function Driving: Yes Vocation: Retired Comments: was driving some first part of year prior to having a stroke on the way to the beach Functional Status:  Mobility: Bed Mobility Bed Mobility: Not assessed Rolling Right: With rail;3: Mod assist Right Sidelying to Sit: 3: Mod assist;With rails;HOB flat Supine to Sit: 3: Mod assist Sitting - Scoot to Edge of Bed: 5: Supervision Sit to Supine: 3: Mod assist Transfers Transfers: Sit to Stand;Stand to Sit;Stand Pivot Transfers Sit to Stand: 1: +2 Total assist;From chair/3-in-1 Sit to Stand: Patient Percentage: 70% Stand to Sit: 3: Mod assist;To chair/3-in-1 Stand Pivot Transfers: 4: Min assist Ambulation/Gait Ambulation/Gait Assistance: 3: Mod assist Ambulation Distance (Feet): 22 Feet (and 12') Assistive device: Rolling walker Ambulation/Gait Assistance Details: seated rest between two ambulation trials. Cues for step length, heel strike; initially off  balance back and to left Gait Pattern: Step-to pattern;Step-through pattern;Decreased step length - right;Decreased stance time - left;Shuffle;Trunk flexed    ADL: ADL Eating/Feeding: Independent (increased spillage due to position) Where Assessed - Eating/Feeding: Bed level Grooming: Wash/dry face;Performed;Wash/dry hands;Min guard Where Assessed - Grooming: Supported standing Upper Body Bathing: Minimal assistance Where Assessed - Upper Body Bathing: Unsupported sitting Lower Body Bathing: Maximal assistance Where Assessed - Lower Body Bathing: Unsupported sitting;Supported sit to stand Upper Body Dressing: Minimal assistance Where Assessed - Upper Body Dressing: Unsupported sitting Lower Body Dressing: Maximal assistance Where Assessed - Lower Body Dressing: Unsupported sitting;Supported sit to stand Toilet Transfer: Simulated;+2 Total assistance Toilet Transfer Method: Sit to Barista:  (chair) Equipment Used: Rolling walker Transfers/Ambulation Related to ADLs: +2 assist to stand from chair. Min-mod assist wtih RW ambulating to sink. ADL Comments: decreased activity tolerance. somewhat limited due to pain.  Cognition: Cognition Arousal/Alertness: Awake/alert Orientation Level: Oriented X4 Cognition Overall Cognitive  Status: Appears within functional limits for tasks assessed/performed Arousal/Alertness: Awake/alert Orientation Level: Appears intact for tasks assessed Behavior During Session: [facility] for tasks performed  Blood pressure 126/77, pulse 71, temperature 98.1 F (36.7 C), temperature source Oral, resp. rate 18, height 6\' 1"  (1.854 m), weight 88.4 kg (194 lb 14.2 oz), SpO2 95.00%. Physical Exam  Nursing note and vitals reviewed. Constitutional: He is oriented to person, place, and time. He appears well-developed and well-nourished.  HENT:  Head: Normocephalic and atraumatic.  Neck: Normal range of motion.  Cardiovascular: Normal rate.  An  irregular rhythm present.  Pulmonary/Chest: Effort normal. He has decreased breath sounds in the right lower field and the left lower field.  Abdominal: Soft. Normal appearance and bowel sounds are normal. He exhibits no distension.  Incisions clean and dry. Ileostomy with liquid stool.  Musculoskeletal: He exhibits no edema and no tenderness.  Neurological: He is alert and oriented to person, place, and time.  Left facial paresis with somewhat flat affect. Confused language initially-- improves with conversation.  Mild expressive deficits. Delayed processing. Mild STM deficits.  Follows basic commands without difficulty.  Strength 3+ prox to 4 distally UE. LE he is 3 prox to 4 distally. No sensory deficits.   Skin: Skin is warm and dry.  Psychiatric: He has a normal mood and affect. His behavior is normal.    Results for orders placed during the hospital encounter of 05/30/12 (from the past 24 hour(s))  CBC     Status: Abnormal   Collection Time    06/17/12  5:35 AM      Result Value Range   WBC 9.2  4.0 - 10.5 K/uL   RBC 3.10 (*) 4.22 - 5.81 MIL/uL   Hemoglobin 10.2 (*) 13.0 - 17.0 g/dL   HCT 46.9 (*) 62.9 - 52.8 %   MCV 94.2  78.0 - 100.0 fL   MCH 32.9  26.0 - 34.0 pg   MCHC 34.9  30.0 - 36.0 g/dL   RDW 41.3  24.4 - 01.0 %   Platelets 363  150 - 400 K/uL   No results found.  Assessment/Plan: Diagnosis: metastatic gastric cancer s/p ex lap with subsequent deconditioning, encephalopathy 1. Does the need for close, 24 hr/day medical supervision in concert with the patient's rehab needs make it unreasonable for this patient to be served in a less intensive setting? Yes 2. Co-Morbidities requiring supervision/potential complications: afib, htn, ileostomy 3. Due to bladder management, bowel management, safety, skin/wound care, disease management, medication administration, pain management and patient education, does the patient require 24 hr/day rehab nursing? Yes 4. Does the patient  require coordinated care of a physician, rehab nurse, PT (1-2 hrs/day, 5 days/week), OT (1-2 hrs/day, 5 days/week) and SLP (1-2 hrs/day, 5 days/week) to address physical and functional deficits in the context of the above medical diagnosis(es)? Yes Addressing deficits in the following areas: balance, endurance, locomotion, strength, transferring, bowel/bladder control, bathing, dressing, feeding, grooming, toileting, cognition and psychosocial support 5. Can the patient actively participate in an intensive therapy program of at least 3 hrs of therapy per day at least 5 days per week? Yes 6. The potential for patient to make measurable gains while on inpatient rehab is excellent 7. Anticipated functional outcomes upon discharge from inpatient rehab are supervision to minimal assist with PT, supervision to minimal assist with OT, supervision to mod I with SLP. 8. Estimated rehab length of stay to reach the above functional goals is: 8-12 days 9. Does the patient have  adequate social supports to accommodate these discharge functional goals? Yes 10. Anticipated D/C setting: Home 11. Anticipated post D/C treatments: HH therapy 12. Overall Rehab/Functional Prognosis: excellent  RECOMMENDATIONS: This patient's condition is appropriate for continued rehabilitative care in the following setting: CIR Patient has agreed to participate in recommended program. Yes Note that insurance prior authorization may be required for reimbursement for recommended care.  Comment: Pt was active PTA and is quite motivated to transition to inpatient rehab. Rehab RN to follow up.   Ranelle Oyster, MD, [place]     06/17/2012

## 2012-06-17 NOTE — Progress Notes (Signed)
Patient difficult to arouse.  Opens eyes when name is called, but falls back to sleep.  Pulse ox 83% on room air, O2 2L placed on patient.  BP and Heart rate stable.  Temp 99.5 oral.  CBG: 107.  Dr. Isidoro Donning notified.  Orders received.  Rapid Response here to assess patient.  At 1640 patient awake and talking, Neuro exam unchanged from morning assessment.  Tylenol PO given as ordered.  Wife at bedside.  Will continue to monitor.

## 2012-06-17 NOTE — Progress Notes (Signed)
    SUBJECTIVE: No chest pain or SOB. Eating and tolerating po.   BP 126/77  Pulse 71  Temp(Src) 98.1 F (36.7 C) (Oral)  Resp 18  Ht 6\' 1"  (1.854 m)  Wt 194 lb 14.2 oz (88.4 kg)  BMI 25.72 kg/m2  SpO2 95%  Intake/Output Summary (Last 24 hours) at 06/17/12 0747 Last data filed at 06/17/12 0534  Gross per 24 hour  Intake      0 ml  Output   2150 ml  Net  -2150 ml    PHYSICAL EXAM General: Well developed, well nourished, in no acute distress. Alert and oriented x 3.  Psych:  Good affect, responds appropriately Neck: No JVD. No masses noted.  Lungs: Clear bilaterally with no wheezes or rhonci noted.  Heart: RRR with no murmurs noted. Abdomen: Bowel sounds are present. Soft, non-tender.  Extremities: No lower extremity edema.   LABS: CBC:  Recent Labs  06/17/12 0535  WBC 9.2  HGB 10.2*  HCT 29.2*  MCV 94.2  PLT 363    Current Meds: . antiseptic oral rinse  15 mL Mouth Rinse BID  . diltiazem  120 mg Oral Daily  . feeding supplement  237 mL Oral TID WC  . pantoprazole  40 mg Oral BID AC  . rivaroxaban  20 mg Oral Q supper  . sodium chloride  3 mL Intravenous Q12H  . white petrolatum         ASSESSMENT AND PLAN:  1. Paroxysmal atrial fibrillation: One brief run of atrial fibrillation yesterday am with rate control. Maintaining sinus rhythm this am. He is on Cardizem CD and Xarelto.   2. Stage 4 metastatic adenocardinoma of stomach: Status post diverting loop ileostomy with scalene node biopsy. Further plans per oncology/surgical teams.     MCALHANY,CHRISTOPHER  3/31/20147:47 AM

## 2012-06-17 NOTE — Progress Notes (Signed)
Patient ID: [name] [name], male   DOB: 22-Apr-1930, 79 y.o.   MRN: [id number] 4 Days Post-Op  Subjective: Pt feels ok today.  Increase in speech thickness and facial droop today.  Objective: Vital signs in last 24 hours: Temp:  [98.1 F (36.7 C)-98.8 F (37.1 C)] 98.1 F (36.7 C) (03/31 0533) Pulse Rate:  [71-75] 71 (03/31 0533) Resp:  [18-19] 18 (03/31 0533) BP: (107-126)/(61-77) 126/77 mmHg (03/31 0533) SpO2:  [93 %-95 %] 95 % (03/31 0533) Last BM Date: 06/15/12  Intake/Output from previous day: 03/30 0701 - 03/31 0700 In: -  Out: 2150 [Urine:1850; Stool:300] Intake/Output this shift: Total I/O In: 123 [P.O.:120; I.V.:3] Out: -   PE: Abd: soft, minimally tender, ileostomy with good output and stoma is pink and viable.  Incisions c/d/i Neck: incision c/d/i Face: left sided facial droop  Lab Results:   Recent Labs  06/17/12 0535  WBC 9.2  HGB 10.2*  HCT 29.2*  PLT 363   BMET No results found for this basename: NA, K, CL, CO2, GLUCOSE, BUN, CREATININE, CALCIUM,  in the last 72 hours PT/INR No results found for this basename: LABPROT, INR,  in the last 72 hours CMP     Component Value Date/Time   NA 138 06/10/2012 0544   K 4.6 06/10/2012 0544   CL 102 06/10/2012 0544   CO2 28 06/10/2012 0544   GLUCOSE 114* 06/10/2012 0544   BUN 20 06/10/2012 0544   CREATININE 0.97 06/10/2012 0544   CALCIUM 9.1 06/10/2012 0544   PROT 5.0* 06/09/2012 0555   ALBUMIN 2.2* 06/09/2012 0555   AST 27 06/09/2012 0555   ALT 18 06/09/2012 0555   ALKPHOS 144* 06/09/2012 0555   BILITOT 0.4 06/09/2012 0555   GFRNONAA 76* 06/10/2012 0544   GFRAA 89* 06/10/2012 0544   Lipase  No results found for this basename: lipase       Studies/Results: No results found.  Anti-infectives: Anti-infectives   Start     Dose/Rate Route Frequency Ordered Stop   06/13/12 1342  ciprofloxacin (CIPRO) 400 MG/200ML IVPB    Comments:  MUMM,VALERIE: cabinet override      06/13/12 1342 06/13/12 1412   06/13/12  0000  ciprofloxacin (CIPRO) IVPB 400 mg    Comments:  On call to OR   400 mg 200 mL/hr over 60 Minutes Intravenous  Once 06/12/12 0834 06/13/12 0018   06/13/12 0000  metroNIDAZOLE (FLAGYL) IVPB 500 mg    Comments:  On call to OR   500 mg 100 mL/hr over 60 Minutes Intravenous  Once 06/12/12 0834 06/13/12 0017   05/30/12 2200  ciprofloxacin (CIPRO) IVPB 400 mg  Status:  Discontinued     400 mg 200 mL/hr over 60 Minutes Intravenous Every 12 hours 05/30/12 2123 06/04/12 0746   05/30/12 2200  metroNIDAZOLE (FLAGYL) IVPB 500 mg  Status:  Discontinued     500 mg 100 mL/hr over 60 Minutes Intravenous Every 8 hours 05/30/12 2123 06/04/12 0746       Assessment/Plan  1. Gastric adenocarcinoma with metastatic disease 2. S/p diverting loop ileostomy secondary to colonic obstruction from #1  Plan: 1. Cytology reveals adenocarcinoma 2. Cont with regular diet 3. Further treatment care per dr. Truett Perna. 4. Hopefully to rehab soon.   LOS: 18 days    [name] [name] E 06/17/2012, 10:53 AM Pager: [phone]

## 2012-06-17 NOTE — Progress Notes (Signed)
PT Cancellation Note  Patient Details Name: [name] [name] MRN: [id number] DOB: 1930/10/04   Cancelled Treatment:    Reason Treat Not Completed: Fatigue/lethargy limiting ability to participate. Friend present in room reports he was up in chair several hours this morning. Reports he has been alseep at least 2 hours. Attempted to awaken pt and he would briefly open eyes, utter a few words and drift back to sleep. Changed pt's position and tried again to awaken him x 2 with same results. Pt too fatigued to participate currently. Will allow him to rest and attempt to see him earlier in the day tomorrow.   Jeryl Umholtz 06/17/2012, 3:59 PM  06/17/2012 Veda Canning, PT Pager: [phone]

## 2012-06-17 NOTE — Progress Notes (Signed)
Work on Land out for dehydration - higher risk in elderly patient with ileostomy.  PRN pepto/loperamide.  Add iron for anemia (constipating as well)

## 2012-06-17 NOTE — Progress Notes (Signed)
Occupational Therapy Treatment Patient Details Name: [name] [name] MRN: [id number] DOB: Nov 05, 1930 Today's Date: 06/17/2012 Time: 1210-1250 OT Time Calculation (min): 40 min  OT Assessment / Plan / Recommendation Comments on Treatment Session Pt with improvements today in LE adls now able to reach feet by crossing them up.  Pt with increased tolerance for activity today as well.    Follow Up Recommendations  CIR    Barriers to Discharge       Equipment Recommendations  3 in 1 bedside comode    Recommendations for Other Services Rehab consult  Frequency Min 2X/week   Plan Discharge plan remains appropriate    Precautions / Restrictions Precautions Precautions: Fall Precaution Comments: ileostomy, abdominal incisional wound Restrictions Weight Bearing Restrictions: No   Pertinent Vitals/Pain Pt with 6/10 pain. Nurse gave pain meds during treatment.  BP in sitting 106/67.    ADL  Eating/Feeding: Performed;Independent Where Assessed - Eating/Feeding: Chair Grooming: Performed;Wash/dry hands;Wash/dry face Where Assessed - Grooming: Supported standing Upper Body Dressing: Performed;Minimal assistance Where Assessed - Upper Body Dressing: Unsupported sitting Lower Body Dressing: Performed;Minimal assistance Where Assessed - Lower Body Dressing: Unsupported sitting;Supported sit to stand Toilet Transfer: Performed;Minimal assistance Toilet Transfer Method: Stand pivot Toilet Transfer Equipment: Raised toilet seat with arms (or 3-in-1 over toilet) Toileting - Clothing Manipulation and Hygiene: Performed;Minimal assistance Where Assessed - Engineer, mining and Hygiene: Standing Equipment Used: Rolling walker Transfers/Ambulation Related to ADLs: min assist to transfer with RW.  Transferred sit to stand x4 reps and by last time pt was min guard.  Pt stood each time for more than 45 seconds. ADL Comments: Pt now able to access feet in sitting and did donn and  doff socks with minimal assist.      OT Diagnosis:    OT Problem List:   OT Treatment Interventions:     OT Goals Acute Rehab OT Goals OT Goal Formulation: With patient Time For Goal Achievement: 06/24/12 Potential to Achieve Goals: Good ADL Goals Pt Will Perform Grooming: with supervision;Standing at sink ADL Goal: Grooming - Progress: Progressing toward goals Pt Will Perform Upper Body Bathing: Sitting at sink;with supervision Pt Will Perform Lower Body Bathing: with min assist;Sitting at sink;Standing at sink Pt Will Perform Upper Body Dressing: with set-up;Sitting, bed ADL Goal: Upper Body Dressing - Progress: Progressing toward goals Pt Will Perform Lower Body Dressing: with min assist;Sit to stand from bed ADL Goal: Lower Body Dressing - Progress: Progressing toward goals Pt Will Transfer to Toilet: with supervision;Ambulation;with DME ADL Goal: Toilet Transfer - Progress: Progressing toward goals Pt Will Perform Toileting - Clothing Manipulation: with supervision;Standing ADL Goal: Toileting - Clothing Manipulation - Progress: Progressing toward goals Pt Will Perform Toileting - Hygiene: with supervision;Sit to stand from 3-in-1/toilet ADL Goal: Toileting - Hygiene - Progress: Progressing toward goals Miscellaneous OT Goals Miscellaneous OT Goal #1: Pt will perform bed mobility with supervision in preparation for ADL. OT Goal: Miscellaneous Goal #1 - Progress: Progressing toward goals Miscellaneous OT Goal #2: Pt will utilize energy conservation strategies during ADL and mobility with min verbal cues.  Visit Information  Last OT Received On: 06/17/12 Assistance Needed: +1    Subjective Data      Prior Functioning       Cognition  Cognition Overall Cognitive Status: Appears within functional limits for tasks assessed/performed Arousal/Alertness: Awake/alert Orientation Level: Oriented X4 / Intact Behavior During Session: [facility] for tasks performed Cognition - Other  Comments: appears grossly intact.     Mobility  Bed  Mobility Bed Mobility: Rolling Right;Right Sidelying to Sit;Sitting - Scoot to Edge of Bed Rolling Right: 4: Min assist;With rail Right Sidelying to Sit: 4: Min assist;HOB flat;With rails Sitting - Scoot to Edge of Bed: 5: Supervision Details for Bed Mobility Assistance: cues to use rails instead of pulling up on therapist. Transfers Transfers: Sit to Stand;Stand to Sit Sit to Stand: 4: Min assist;From bed;With armrests Stand to Sit: 4: Min assist;To chair/3-in-1;With armrests Details for Transfer Assistance: Pt needs repeated cues for hand placement during transfers.    Exercises      Balance Balance Balance Assessed: Yes Static Sitting Balance Static Sitting - Balance Support: Bilateral upper extremity supported;Feet supported Static Sitting - Level of Assistance: 5: Stand by assistance Static Sitting - Comment/# of Minutes: 5 Static Standing Balance Static Standing - Balance Support: Bilateral upper extremity supported Static Standing - Level of Assistance: 4: Min assist Static Standing - Comment/# of Minutes: 2 x3 trials.   End of Session OT - End of Session Activity Tolerance: Patient limited by fatigue Patient left: in chair;with call bell/phone within reach Nurse Communication: Mobility status  GO     [name] [name] 06/17/2012, 1:00 PM [phone]

## 2012-06-17 NOTE — Progress Notes (Addendum)
CIR Admissions: Met with patient at bedside with his son-in-law present to discuss possible CIR admission. Patient would benefit from inpatient rehab prior to returning home. Patient's wife will be available to assist as needed after discharge and he has a very supportive family living nearby. Pt was very active prior to this and is eager to come to rehab. Explained to pt that admission will be pending insurance approval and bed availability. I have begun the process for insurance approval and will keep the team informed.  For questions call [phone]

## 2012-06-17 NOTE — Progress Notes (Signed)
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 The facial droop is from bells palsy and chronic, no dysarthria at this time.   -CT Head negative  -had apparent admit for CVA Jan 2014 in [facility] and was also admitted to [facility] Jan 2014 for expressive aphasia   Diarrhea  -resolved in absence of significant intake   Rapid atrial fibrillation/flutter  - currently tolerating oral Cardizem 120mg  daily and xarelto  Elevated cardiac enzymes  -Likely due to rapid heart rate/ demand ischemia - normalized as of 3/19   Hx of HYPERTENSION, BENIGN SYSTEMIC  not an active issue at this time   DVT Prophylaxis:xarelto  Code Status:  Disposition: CIR consult placed, hopefull will have approval, bed by tomorrow    Subjective: Feeling fine, no dysarthria noted, the facial drooping is chronic ( see my note), son-in-law at the bedside, the patient has no complaints  Objective: Weight change:   Intake/Output Summary (Last 24 hours) at 06/17/12 1459 Last data filed at 06/17/12 1438  Gross per 24 hour  Intake    243 ml  Output   1700 ml   Net  -1457 ml   Blood pressure 116/76, pulse 71, temperature 97.7 F (36.5 C), temperature source Oral, resp. rate 18, height 6\' 1"  (1.854 m), weight 88.4 kg (194 lb 14.2 oz), SpO2 96.00%.  Physical Exam: General: A x O x 3 CVS: S1-S2 clear, ireg Chest: CTAB Abdomen: soft nontender, ileostomy Extremities: no c/c/e bilaterally   Lab Results: Basic Metabolic Panel: No results found for this basename: NA, K, CL, CO2, GLUCOSE, BUN, CREATININE, CALCIUM, MG, PHOS,  in the last 168 hours Liver Function Tests: No results found for this basename: AST, ALT, ALKPHOS, BILITOT, PROT, ALBUMIN,  in the last 168 hours CBC:  Recent Labs Lab 06/14/12 0526 06/17/12 0535  WBC 8.7 9.2  HGB 10.2* 10.2*  HCT 29.2* 29.2*  MCV 95.4 94.2  PLT 338 363     Recent Labs Lab 06/13/12 1307  GLUCAP 99     Micro Results: Recent Results (from the past 240 hour(s))  SURGICAL PCR SCREEN     Status: None   Collection Time    06/13/12  7:04 AM      Result Value Range Status   MRSA, PCR NEGATIVE  NEGATIVE Final   Staphylococcus aureus NEGATIVE  NEGATIVE Final   Comment:            The Xpert SA Assay (FDA     approved for NASAL specimens     in patients over 73 years of age),     is one component of     a comprehensive  surveillance     program.  Test performance has     been validated by [facility] for patients greater     than or equal to 63 year old.     It is not intended     to diagnose infection nor to     guide or monitor treatment.    Studies/Results: Dg Abd 1 View  06/01/2012  *RADIOLOGY REPORT*  Clinical Data: Obstruction  ABDOMEN - 1 VIEW  Comparison: 05/30/2012  Findings: Distended small bowel loops are scattered across the abdomen.  Minimal colonic gas.  NG tube coiled in the fundus of the stomach.  Phleboliths project over the pelvis.  No obvious free intraperitoneal gas. There are small gas bubbles within the region of the wall of a bowel loop along the left lower  quadrant.  The bubbles continue into the lumen of the bowel superiorly therefore it is not felt to represent pneumatosis.  IMPRESSION: Stable partial small bowel obstruction pattern.   Original Report Authenticated By: Jolaine Click, M.D.    Ct Head Wo Contrast  06/04/2012  *RADIOLOGY REPORT*  Clinical Data: New onset confusion and visual hallucinations. Prior history of stroke.  CT HEAD WITHOUT CONTRAST  Technique:  Contiguous axial images were obtained from the base of the skull through the vertex without contrast.  Comparison: Unenhanced cranial CT 04/17/2012, 03/12/2011.  MRI brain 03/12/2011.  Findings: Moderate cortical and deep atrophy, unchanged.  Moderate to severe changes of small vessel disease of the white matter,, including the brainstem, unchanged.  Old lacunar strokes in the deep white matter of the right frontal lobe and in the right inferior cerebellar hemisphere, unchanged.  No mass lesion.  No midline shift.  No acute hemorrhage or hematoma.  No extra-axial fluid collections.  No evidence of acute infarction.  No significant interval change.  Near complete opacification of the left maxillary sinus with thickening of the sinus walls.  Opacification of a middle right ethmoid air cell.  Remaining visualized paranasal sinuses, bilateral mastoid air cells, and both middle ear cavities well- aerated.  Bilateral carotid siphon atherosclerosis.  Calcified pannus posterior to the dens again noted.  IMPRESSION:  1.  No acute intracranial abnormality. 2.  Stable moderate generalized atrophy, moderate to severe chronic microvascular ischemic changes of the white matter, and old lacunar strokes in the right frontal lobe and right inferior cerebellar hemisphere. 3.  Chronic left maxillary sinusitis and minimal chronic right ethmoid sinusitis.   Original Report Authenticated By: Hulan Saas, M.D.    Ct Abdomen Pelvis W Contrast  05/30/2012  *RADIOLOGY REPORT*  Clinical Data: Near-syncope.  Abdominal pain  with bloating and diarrhea.  CT ABDOMEN AND PELVIS WITH CONTRAST  Technique:  Multidetector CT imaging of the abdomen and pelvis was performed following the standard protocol during bolus administration of intravenous contrast.  Contrast: 80mL OMNIPAQUE IOHEXOL 300 MG/ML  SOLN  Comparison: None.  Findings: Lung bases show dependent atelectasis bilaterally.  Heart size normal.  No pericardial effusion.  There may be trace left pleural fluid.  Distal esophagus is dilated and fluid-filled.  Intrahepatic biliary duct dilatation.  Extrahepatic bile duct is somewhat difficult to follow but is likely within normal limits. Intermediate attenuation material layers in the gallbladder.  Right kidney is unremarkable.  Low attenuation lesions in the left kidney measure up to 1.5 cm and are likely cysts.  Spleen, pancreas, stomach and duodenum are unremarkable.  There is rather diffuse distention of fluid-filled small bowel, without a  discrete transition point.  The cecum and proximal ascending colon are fluid filled and dilated as well.  However, there is a fairly long segment of under distention of the colon with noticeable wall thickening involving the distal ascending, transverse and proximal descending portions.  The distal descending and rectosigmoid colon are minimally dilated and contains fluid.  Moderate ascites.  Mesenteric edema.  No definite pathologically enlarged lymph nodes.  Atherosclerotic calcification of the arterial vasculature.  Prostate is enlarged.  No worrisome lytic or sclerotic lesions.  IMPRESSION:  1.  Long segment of nondistended and thickened colon, involving the ascending, transverse and descending portions, with diffuse fluid filled and dilated small bowel.  Ischemic and infectious/inflammatory etiologies involving the colon are considered, with resultant mechanical small bowel obstruction. Critical Value/emergent results were called by telephone at the time of interpretation on 05/30/2012 at 1225  hours to Dr. Radford Pax, who verbally acknowledged these results. 2.  Tiny left pleural effusion and moderate ascites. 3.  Intrahepatic biliary duct dilatation, of uncertain etiology. 4.  Question gallbladder sludge. 5.  Prostate enlargement.   Original Report Authenticated By: Leanna Battles, M.D.    Dg Chest Port 1 View  06/06/2012  *RADIOLOGY REPORT*  Clinical Data: Shortness of breath.  Congestion.  PORTABLE CHEST - 1 VIEW  Comparison: 06/05/2012.  Findings: Tortuous aorta particularly involving the ascending aspect.  Cardiomegaly.  Pulmonary vascular congestion most notable centrally  Poor inspiration.  Crowding markings lung bases versus atelectasis. Subtle infiltrate would be difficult to exclude in this setting. Appearance without significant change.  Right lung apex not entirely included present exam.  No gross pneumothorax.  IMPRESSION: Pulmonary vascular congestion most notable centrally.  Poor inspiration with basilar atelectasis.  Tortuous aorta.  Cardiomegaly.   Original Report Authenticated By: Lacy Duverney, M.D.    Dg Chest Port 1 View  06/05/2012  *RADIOLOGY REPORT*  Clinical Data: Shortness of breath.  PORTABLE CHEST - 1 VIEW  Comparison: 05/31/2012  Findings: Shallow inspiration.  Bilateral pleural effusions with basilar atelectasis or infiltration bilaterally.  These changes are progressing since previous study.  Mild cardiac enlargement with borderline normal pulmonary vascularity.  No pneumothorax. Tortuous aorta.  IMPRESSION: Shallow inspiration with increasing bilateral pleural effusions and bilateral basilar atelectasis or infiltration.   Original Report Authenticated By: Burman Nieves, M.D.    Dg Chest Port 1 View  05/31/2012  *RADIOLOGY REPORT*  Clinical Data: Ascites.  Pleural effusion.  PORTABLE CHEST - 1 VIEW  Comparison: 05/10/2012.  Findings: Nasogastric tube gastric fundus level.  Bibasilar atelectasis.  Poor inspiration.  Limited evaluation lung bases.  Central pulmonary  vascular prominence.  Tortuous aorta.  No gross pneumothorax.  Heart size top normal.  IMPRESSION: Poor inspiration with bibasilar atelectatic changes.  Limited evaluation lung bases.  Calcified tortuous aorta.  Central pulmonary vascular prominence.   Original Report Authenticated By: Lacy Duverney, M.D.    Dg Swallowing Func-speech Pathology  06/07/2012  Riley Nearing Deblois, CCC-SLP     06/07/2012 10:39 AM Objective Swallowing Evaluation: Modified Barium Swallowing Study   Patient Details  Name: KUNAAL WALKINS MRN: [id number] Date of Birth: 1931/02/10  Today's Date: 06/07/2012 Time: 0940-1005 SLP Time Calculation (min): 25 min  Past Medical History:  Past Medical History  Diagnosis Date  . Ramsay Hunt auricular syndrome   . HTN (hypertension)   . Osteoarthritis   . Dysrhythmia 04/17/12  . Anxiety   . Stroke   . H/O Bell's palsy at age 38  . Atrial flutter   . Atrial  fibrillation    Past Surgical History:  Past Surgical History  Procedure Laterality Date  . Hernia repair    . Vasectomy    . Prostate biopsies      per Vonita Moss  . Cataract extraction      SE eye  . Colonoscopy N/A 06/03/2012    Procedure: COLONOSCOPY;  Surgeon: Florencia Reasons, MD;   Location: [facility] ENDOSCOPY;  Service: Endoscopy;  Laterality: N/A;  . Esophagogastroduodenoscopy N/A 06/03/2012    Procedure: ESOPHAGOGASTRODUODENOSCOPY (EGD);  Surgeon: Florencia Reasons, MD;  Location: [facility] ENDOSCOPY;  Service: Endoscopy;   Laterality: N/A;   HPI:  79 yo WM with a pmh of stroke/caf on pradaxa who presented to  [facility] ED 3-13 with 2 days of abd pain and distension. Admitted to  hospital and underwent EGD 3-17 and multiple bxs were obtained  the gastric and the colonic biopsies show malignancy, probable  adenocarcinoma. He developed increased wob and clinical findings  consistent with pulmonary edema(crackles) and was transferred to  ICU from SDU. He did not tolerate NIMVS (claustrophobia) but  responded well to lasix and NTG. Note he has speech changes   (dysarthia and thick tongue) CT head 3-19 with no new changes.  Upper GI on 01/04/12 shows Moderate tertiary contractions in the  mid and distal esophagus, swallowing mechanism normal. Bedside  swallow revealed concerning signs of aspiration, objective test  needed.      Assessment / Plan / Recommendation Clinical Impression  Dysphagia Diagnosis: Mild oral phase dysphagia;Mild pharyngeal  phase dysphagia;Mild cervical esophageal phase  dysphagia;Suspected primary esophageal dysphagia Clinical impression: Pt demonstrates a mild oral, pharyngeal and  cervical esohageal phase dysphagia, which are likely all part of  pts baseline function. Pt has a baseline left lingual, labial  weakness resulting in mild anterior spillage on the left and oral  residuals. Pt is able to improve oral control by consuming  liquids with a straw place in the right side of the mouth. There  is a mild sensory motor pharyngeal deficits associated with  decreased base of tongue retraction and epiglottic deflection.  The swallow is delayed to the level of the pyriform sinuses with  thin liquids. Mild, unsensed residual remains post swallow with  liquids and solids likely due to weakness and limited passage of  bolus through UES. There was no penetration or aspiration  observed during this study.   Also of concern was the appearance of poor esophageal transit of  soldis and liquids. Stasis remains throughout the esophageal  column.  Pt is recommended to consume thin liquids, will defer to  surgery for diet textuere given GI pathology. Pt is physically  able to masticate regular solids but may need softer texture for  digestion. Offered pt and family compensatory strategies to clear  residuals, and esophageal precautions written and verbal. Pt and  wife verbalize and demonstrate understanding. As pt's speech is  much improved today, no further SLP f/u needed at this time.     Treatment Recommendation  No treatment recommended at this time    Diet  Recommendation Thin liquid   Liquid Administration via: Straw Medication Administration: Whole meds with liquid Supervision: Patient able to self feed;Full supervision/cueing  for compensatory strategies Compensations: Slow rate;Small sips/bites;Check for anterior  loss;Multiple dry swallows after each bite/sip;Follow solids with  liquid Postural Changes and/or Swallow Maneuvers: Seated upright 90  degrees;Upright 30-60 min after meal    Other  Recommendations Oral Care Recommendations: Oral care BID   Follow Up  Recommendations  None    Frequency and Duration        Pertinent Vitals/Pain NA    SLP Swallow Goals     General HPI: 79 yo WM with a pmh of stroke/caf on pradaxa who  presented to [facility] ED 3-13 with 2 days of abd pain and distension.  Admitted to hospital and underwent EGD 3-17 and multiple bxs were  obtained the gastric and the colonic biopsies show malignancy,  probable adenocarcinoma. He developed increased wob and clinical  findings consistent with pulmonary edema(crackles) and was  transferred to ICU from SDU. He did not tolerate NIMVS  (claustrophobia) but responded well to lasix and NTG. Note he has  speech changes (dysarthia and thick tongue) CT head 3-19 with no  new changes. Upper GI on 01/04/12 shows Moderate tertiary  contractions in the mid and distal esophagus, swallowing  mechanism normal. Bedside swallow revealed concerning signs of  aspiration, objective test needed.  Type of Study: Modified Barium Swallowing Study Reason for Referral: Objectively evaluate swallowing function Previous Swallow Assessment: noe Diet Prior to this Study: NPO Temperature Spikes Noted: No Respiratory Status: Room air History of Recent Intubation: No Behavior/Cognition: Cooperative;Pleasant mood;Lethargic Oral Cavity - Dentition: Adequate natural dentition Oral Motor / Sensory Function: Impaired - see Bedside swallow  eval Self-Feeding Abilities: Able to feed self Patient Positioning: Upright in chair Baseline  Vocal Quality: Clear Volitional Cough: Strong Volitional Swallow: Able to elicit Anatomy: Other (Comment) (Appearance of osteophytic changes at  C5/6/7) Pharyngeal Secretions: Not observed secondary MBS    Reason for Referral Objectively evaluate swallowing function   Oral Phase Oral Preparation/Oral Phase Oral Phase: Impaired Oral - Thin Oral - Thin Cup: Left anterior bolus loss;Weak lingual  manipulation;Left pocketing in lateral sulci;Pocketing in  anterior sulcus;Lingual/palatal residue Oral - Thin Straw: Weak lingual manipulation;Lingual/palatal  residue Oral - Solids Oral - Puree: Weak lingual manipulation;Lingual/palatal residue Oral - Regular: Weak lingual manipulation;Lingual/palatal residue Oral - Pill: Lingual/palatal residue   Pharyngeal Phase Pharyngeal Phase Pharyngeal Phase: Impaired Pharyngeal - Thin Pharyngeal - Thin Cup: Delayed swallow initiation;Premature  spillage to pyriform sinuses;Reduced tongue base  retraction;Reduced epiglottic inversion;Pharyngeal residue -  valleculae;Pharyngeal residue - pyriform sinuses;Pharyngeal  residue - cp segment Pharyngeal - Thin Straw: Delayed swallow initiation;Premature  spillage to pyriform sinuses;Reduced tongue base  retraction;Reduced epiglottic inversion;Pharyngeal residue -  valleculae;Pharyngeal residue - pyriform sinuses;Pharyngeal  residue - cp segment Pharyngeal - Solids Pharyngeal - Puree: Delayed swallow initiation;Reduced tongue  base retraction;Reduced epiglottic inversion;Pharyngeal residue -  valleculae;Pharyngeal residue - pyriform sinuses Pharyngeal - Regular: Delayed swallow initiation;Reduced tongue  base retraction;Reduced epiglottic inversion;Pharyngeal residue -  valleculae;Pharyngeal residue - pyriform sinuses  Cervical Esophageal Phase    GO    Cervical Esophageal Phase Cervical Esophageal Phase: Impaired Cervical Esophageal Phase - Comment Cervical Esophageal Comment: Due to apperance of impingement form  cervical spine at C5 to C7  there was reduced opening othe UES  with pild cervical esophageal residuals and 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 There was also mild residual in pyriform sinuses  due to decreased transit of bolus. No radiologist present to  confirm.          DeBlois, Riley Nearing 06/07/2012, 10:31 AM      Medications: Scheduled Meds: . antiseptic oral rinse  15 mL Mouth Rinse BID  . diltiazem  120 mg Oral Daily  . feeding supplement  237 mL Oral TID WC  . ferrous sulfate  325 mg Oral BID WC  . Gerhardt's butt cream  Topical BID  . pantoprazole  40 mg Oral BID AC  . rivaroxaban  20 mg Oral Q supper  . sodium chloride  3 mL Intravenous Q12H  . vitamin C  500 mg Oral BID  . white petrolatum          LOS: 18 days   Haruka Kowaleski M.D. [facility] 06/17/2012, 2:59 PM Pager: [phone]  If 7PM-7AM, please contact night-coverage [email or web address] Password [email or web address]

## 2012-06-18 ENCOUNTER — Telehealth: Payer: Self-pay | Admitting: Oncology

## 2012-06-18 ENCOUNTER — Inpatient Hospital Stay (HOSPITAL_COMMUNITY)
Admission: RE | Admit: 2012-06-18 | Discharge: 2012-06-26 | DRG: 945 | Disposition: A | Discharge status: Home Health | Payer: Medicare Other | Source: Intra-hospital | Attending: Physical Medicine & Rehabilitation | Admitting: Physical Medicine & Rehabilitation

## 2012-06-18 DIAGNOSIS — Z79899 Other long term (current) drug therapy: Secondary | ICD-10-CM | POA: Diagnosis not present

## 2012-06-18 DIAGNOSIS — Z5189 Encounter for other specified aftercare: Principal | ICD-10-CM

## 2012-06-18 DIAGNOSIS — G9349 Other encephalopathy: Secondary | ICD-10-CM | POA: Diagnosis not present

## 2012-06-18 DIAGNOSIS — C162 Malignant neoplasm of body of stomach: Secondary | ICD-10-CM

## 2012-06-18 DIAGNOSIS — C169 Malignant neoplasm of stomach, unspecified: Secondary | ICD-10-CM

## 2012-06-18 DIAGNOSIS — B958 Unspecified staphylococcus as the cause of diseases classified elsewhere: Secondary | ICD-10-CM | POA: Diagnosis not present

## 2012-06-18 DIAGNOSIS — I4891 Unspecified atrial fibrillation: Secondary | ICD-10-CM | POA: Diagnosis not present

## 2012-06-18 DIAGNOSIS — N390 Urinary tract infection, site not specified: Secondary | ICD-10-CM | POA: Diagnosis not present

## 2012-06-18 DIAGNOSIS — E871 Hypo-osmolality and hyponatremia: Secondary | ICD-10-CM | POA: Diagnosis not present

## 2012-06-18 DIAGNOSIS — R531 Weakness: Secondary | ICD-10-CM | POA: Diagnosis present

## 2012-06-18 DIAGNOSIS — A499 Bacterial infection, unspecified: Secondary | ICD-10-CM | POA: Diagnosis not present

## 2012-06-18 DIAGNOSIS — Z833 Family history of diabetes mellitus: Secondary | ICD-10-CM

## 2012-06-18 DIAGNOSIS — R5381 Other malaise: Secondary | ICD-10-CM

## 2012-06-18 DIAGNOSIS — G9340 Encephalopathy, unspecified: Secondary | ICD-10-CM | POA: Diagnosis present

## 2012-06-18 DIAGNOSIS — I639 Cerebral infarction, unspecified: Secondary | ICD-10-CM | POA: Diagnosis present

## 2012-06-18 DIAGNOSIS — I4892 Unspecified atrial flutter: Secondary | ICD-10-CM

## 2012-06-18 DIAGNOSIS — Z87891 Personal history of nicotine dependence: Secondary | ICD-10-CM | POA: Diagnosis not present

## 2012-06-18 DIAGNOSIS — I10 Essential (primary) hypertension: Secondary | ICD-10-CM | POA: Diagnosis present

## 2012-06-18 DIAGNOSIS — F4322 Adjustment disorder with anxiety: Secondary | ICD-10-CM | POA: Diagnosis not present

## 2012-06-18 DIAGNOSIS — Z880 Allergy status to penicillin: Secondary | ICD-10-CM

## 2012-06-18 DIAGNOSIS — G4700 Insomnia, unspecified: Secondary | ICD-10-CM

## 2012-06-18 DIAGNOSIS — G9341 Metabolic encephalopathy: Secondary | ICD-10-CM

## 2012-06-18 DIAGNOSIS — K922 Gastrointestinal hemorrhage, unspecified: Secondary | ICD-10-CM | POA: Diagnosis not present

## 2012-06-18 DIAGNOSIS — C785 Secondary malignant neoplasm of large intestine and rectum: Secondary | ICD-10-CM

## 2012-06-18 DIAGNOSIS — Z7901 Long term (current) use of anticoagulants: Secondary | ICD-10-CM

## 2012-06-18 DIAGNOSIS — F411 Generalized anxiety disorder: Secondary | ICD-10-CM | POA: Diagnosis present

## 2012-06-18 DIAGNOSIS — Z932 Ileostomy status: Secondary | ICD-10-CM | POA: Diagnosis not present

## 2012-06-18 DIAGNOSIS — D62 Acute posthemorrhagic anemia: Secondary | ICD-10-CM

## 2012-06-18 DIAGNOSIS — J189 Pneumonia, unspecified organism: Secondary | ICD-10-CM | POA: Diagnosis not present

## 2012-06-18 DIAGNOSIS — I69922 Dysarthria following unspecified cerebrovascular disease: Secondary | ICD-10-CM | POA: Diagnosis not present

## 2012-06-18 LAB — BASIC METABOLIC PANEL
Calcium: 9.1 mg/dL (ref 8.4–10.5)
Creatinine, Ser: 0.91 mg/dL (ref 0.50–1.35)
GFR calc Af Amer: >90 mL/min (ref >90)

## 2012-06-18 MED ORDER — LEVOFLOXACIN 750 MG PO TABS
750.0000 mg | ORAL_TABLET | Freq: Every day | ORAL | Status: DC
  Administered 2012-06-19 – 2012-06-23 (×5): 750 mg via ORAL
  Filled 2012-06-18 (×7): qty 1

## 2012-06-18 MED ORDER — ONDANSETRON HCL 4 MG/2ML IJ SOLN: 4.0000 mg | Freq: Four times a day (QID) | INTRAMUSCULAR | Status: DC | PRN

## 2012-06-18 MED ORDER — PANTOPRAZOLE SODIUM 40 MG PO TBEC
40.0000 mg | DELAYED_RELEASE_TABLET | Freq: Two times a day (BID) | ORAL | Status: DC
  Administered 2012-06-19 – 2012-06-26 (×15): 40 mg via ORAL
  Filled 2012-06-18 (×16): qty 1

## 2012-06-18 MED ORDER — RIVAROXABAN 20 MG PO TABS
20.0000 mg | ORAL_TABLET | Freq: Every day | ORAL | Status: DC
  Administered 2012-06-18 – 2012-06-25 (×8): 20 mg via ORAL
  Filled 2012-06-18 (×9): qty 1

## 2012-06-18 MED ORDER — FERROUS SULFATE 325 (65 FE) MG PO TABS
325.0000 mg | ORAL_TABLET | Freq: Two times a day (BID) | ORAL | Status: DC
  Administered 2012-06-18 – 2012-06-26 (×16): 325 mg via ORAL
  Filled 2012-06-18 (×18): qty 1

## 2012-06-18 MED ORDER — ONDANSETRON HCL 4 MG PO TABS: 4.0000 mg | ORAL_TABLET | Freq: Four times a day (QID) | ORAL | Status: DC | PRN

## 2012-06-18 MED ORDER — QUETIAPINE FUMARATE 25 MG PO TABS
25.0000 mg | ORAL_TABLET | Freq: Every day | ORAL | Status: DC
  Filled 2012-06-18: qty 1

## 2012-06-18 MED ORDER — VITAMIN C 500 MG PO TABS
500.0000 mg | ORAL_TABLET | Freq: Two times a day (BID) | ORAL | Status: DC
  Administered 2012-06-18 – 2012-06-26 (×16): 500 mg via ORAL
  Filled 2012-06-18 (×18): qty 1

## 2012-06-18 MED ORDER — TRAZODONE HCL 50 MG PO TABS: 25.0000 mg | ORAL_TABLET | Freq: Every evening | ORAL | Status: DC | PRN

## 2012-06-18 MED ORDER — GERHARDT'S BUTT CREAM
TOPICAL_CREAM | Freq: Two times a day (BID) | CUTANEOUS | Status: DC
  Administered 2012-06-18 – 2012-06-22 (×8): via TOPICAL
  Administered 2012-06-23: 1 via TOPICAL
  Administered 2012-06-23: 20:00:00 via TOPICAL
  Filled 2012-06-18: qty 1

## 2012-06-18 MED ORDER — DILTIAZEM HCL ER COATED BEADS 120 MG PO CP24
120.0000 mg | ORAL_CAPSULE | Freq: Every day | ORAL | Status: DC
  Administered 2012-06-19 – 2012-06-26 (×7): 120 mg via ORAL
  Filled 2012-06-18 (×10): qty 1

## 2012-06-18 MED ORDER — OXYCODONE-ACETAMINOPHEN 5-325 MG PO TABS: 1.0000 | ORAL_TABLET | ORAL | Status: DC | PRN

## 2012-06-18 MED ORDER — BISMUTH SUBSALICYLATE 262 MG/15ML PO SUSP
30.0000 mL | Freq: Three times a day (TID) | ORAL | Status: DC | PRN
  Filled 2012-06-18: qty 236

## 2012-06-18 MED ORDER — NYSTATIN 100000 UNIT/GM EX CREA
TOPICAL_CREAM | Freq: Two times a day (BID) | CUTANEOUS | Status: DC
  Administered 2012-06-19 – 2012-06-26 (×12): via TOPICAL
  Filled 2012-06-18 (×2): qty 15

## 2012-06-18 MED ORDER — GUAIFENESIN-DM 100-10 MG/5ML PO SYRP: 5.0000 mL | ORAL_SOLUTION | Freq: Four times a day (QID) | ORAL | Status: DC | PRN

## 2012-06-18 MED ORDER — TEMAZEPAM 7.5 MG PO CAPS: 7.5000 mg | ORAL_CAPSULE | Freq: Every day | ORAL | Status: DC

## 2012-06-18 MED ORDER — DIPHENHYDRAMINE HCL 12.5 MG/5ML PO ELIX: 12.5000 mg | ORAL_SOLUTION | Freq: Four times a day (QID) | ORAL | Status: DC | PRN

## 2012-06-18 MED ORDER — ENSURE COMPLETE PO LIQD
237.0000 mL | Freq: Three times a day (TID) | ORAL | Status: DC
  Administered 2012-06-18 – 2012-06-20 (×2): 237 mL via ORAL

## 2012-06-18 MED ORDER — ACETAMINOPHEN 325 MG PO TABS: 325.0000 mg | ORAL_TABLET | ORAL | Status: DC | PRN

## 2012-06-18 MED ORDER — LEVOFLOXACIN 750 MG PO TABS
750.0000 mg | ORAL_TABLET | Freq: Every day | ORAL | Status: DC
  Administered 2012-06-18: 750 mg via ORAL
  Filled 2012-06-18: qty 1

## 2012-06-18 NOTE — H&P (Signed)
Physical Medicine and Rehabilitation Admission H&P  Chief Complaint   Patient presents with   .  Deconditioning s/p exp lap with loop ileostomy for metastic gastric cancer.   [name] [name]   HPI: [name] [name] is a 79 y.o. male with h/o CVA 12/12 and recently 1/14, CAF on pradaxa, h/o of gastritis who was admitted on 05/30/12 with A Fib with RVR and GIB. CT suggestive of ischemic colitis and ileus with diffuse ascites. Started on IV cardizem for HR control. EGD with biopsy done positive for gastric CA with mets to colon. He did develop respiratory failure due to pulmonary edema and symptoms improved with diuresis. Dr. Darnelle Catalan consulted for input on stage 4 stomach cancer and treatment options discussed with recommendations for input from palliative care. Cytology/peritoneal fluid positive for Adeno CA--to follow up with Dr. Truett Perna past discharge. Has been evaluated by Palliative care and is Full Code.  On 03/21, Neurology consulted due to delirium with visual hallucinations and as CT negative recommended MRI of brain for workup once stable. Patient elected to undergo exp lap with diverting loop ileostomy on 06/13/12 by Dr. Janee Morn. Post op Xarelto initiated. Mentation continues to fluctuate with confusion at night. Tolerating po's and atrial fibrillation controlled on po Cardizem. Endurance slowly improving. Lethargic with hypoxia and difficult to arouse yesterday due to severe fatigue. CXR with persistent LLL consolidation with question of PNA And he was started on Levaquin today. Therapy team recommending CIR for progression.     ROS: Reports occasional cough. Improving stamina. Some irritation around the ostomy site. Stool has been loose. Improved mentation. Denies ans SOB at rest. Still a little restless at night. A 12 point review of systems has been performed and if not noted above is otherwise negative.   Past Medical History   Diagnosis  Date   .  Ramsay Hunt auricular syndrome     .  HTN (hypertension)    .  Osteoarthritis    .  Dysrhythmia  04/17/12   .  Anxiety    .  Stroke    .  H/O Bell's palsy  at age 59   .  Atrial flutter    .  Atrial fibrillation    .  Ileostomy in place  06/14/2012    Past Surgical History   Procedure  Laterality  Date   .  Hernia repair     .  Vasectomy     .  Prostate biopsies       per Vonita Moss   .  Cataract extraction       SE eye   .  Colonoscopy  N/A  06/03/2012     Procedure: COLONOSCOPY; Surgeon: Florencia Reasons, MD; Location: [facility] ENDOSCOPY; Service: Endoscopy; Laterality: N/A;   .  Esophagogastroduodenoscopy  N/A  06/03/2012     Procedure: ESOPHAGOGASTRODUODENOSCOPY (EGD); Surgeon: Florencia Reasons, MD; Location: [facility] ENDOSCOPY; Service: Endoscopy; Laterality: N/A;   .  Laparoscopy  N/A  06/13/2012     Procedure: EXPLORATORY LAPAROSCOPY ; Surgeon: Liz Malady, MD; Location[facility] OR; Service: General; Laterality: N/A;   .  Diverting ileostomy  N/A  06/13/2012     Procedure: DIVERTING LOOP ILEOSTOMY; Surgeon: Liz Malady, MD; Location: [facility] OR; Service: General; Laterality: N/A;   .  Lymph node biopsy  Left  06/13/2012     Procedure: LYMPH NODE BIOPSY; Surgeon: Liz Malady, MD; Location: [facility] OR; Service: General; Laterality: Left; Scalene Lymph Node Biopsy    Family History  Problem  Relation  Age of Onset   .  Cancer  Father    .  Diabetes  Father     Social History: Married. 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. Active-walks daily and goes to the gym 5 days/wk. Wife retired recently and can provide supervision past discharge. Per reports that he quit smoking about 28 years ago. He has never used smokeless tobacco. He reports that he drinks about 6 ounces of wine daily. He reports that he does not use illicit drugs  Allergies   Allergen  Reactions   .  Penicillins  Rash     Rash appeared on palms of hands and feet.    Medications Prior to Admission   Medication  Sig  Dispense  Refill   .  b complex  vitamins capsule  Take 1 capsule by mouth daily.     .  Cholecalciferol (VITAMIN D-3 PO)  Take 1 tablet by mouth daily.     .  dabigatran (PRADAXA) 150 MG CAPS  Take 1 capsule (150 mg total) by mouth every 12 (twelve) hours.  60 capsule  2   .  diltiazem (CARDIZEM CD) 120 MG 24 hr capsule  Take 120 mg by mouth every morning.     .  metoCLOPramide (REGLAN) 5 MG tablet  Take 5 mg by mouth 3 (three) times daily before meals.     .  Multiple Vitamins-Minerals (PRESERVISION AREDS PO)  Take 1 tablet by mouth 2 (two) times daily.     .  Omega-3 Fatty Acids (FISH OIL) 1000 MG CAPS  Take 1 capsule by mouth daily.     Marland Kitchen  omeprazole (PRILOSEC) 20 MG capsule  Take 1 capsule by mouth 2 (two) times daily.     .  rosuvastatin (CRESTOR) 10 MG tablet  Take 10 mg by mouth daily.     .  silodosin (RAPAFLO) 8 MG CAPS capsule  Take 8 mg by mouth daily with breakfast.     .  sucralfate (CARAFATE) 1 G tablet  Take 1 g by mouth 3 (three) times daily before meals.      Home:  Home Living  Lives With: Spouse  Available Help at Discharge: Available 24 hours/day  Type of Home: House  Home Access: Stairs to enter  [employer] of Steps: 3  Home Layout: Two level;Able to live on main level with bedroom/bathroom  Alternate Level Stairs-Number of Steps: can stay on main  Bathroom Shower/Tub: Pension scheme manager: Standard  Home Adaptive Equipment: None  Functional History:  Prior Function  Driving: Yes  Vocation: Retired  Comments: was driving some first part of year prior to having a stroke on the way to the beach  Functional Status:  Mobility:  Bed Mobility  Bed Mobility: Rolling Right;Right Sidelying to Sit;Sitting - Scoot to Edge of Bed  Rolling Right: 4: Min assist;With rail  Right Sidelying to Sit: 4: Min assist;HOB flat;With rails  Supine to Sit: 3: Mod assist  Sitting - Scoot to Edge of Bed: 5: Supervision  Sit to Supine: 3: Mod assist  Transfers  Transfers: Sit to Stand;Stand to  Sit;Stand Pivot Transfers  Sit to Stand: 4: Min assist;From bed;With armrests  Sit to Stand: Patient Percentage: 70%  Stand to Sit: 4: Min assist;To chair/3-in-1;With armrests  Stand Pivot Transfers: 4: Min assist  Ambulation/Gait  Ambulation/Gait Assistance: 3: Mod assist  Ambulation Distance (Feet): 22 Feet (and 12')  Assistive device: Rolling walker  Ambulation/Gait Assistance Details: seated rest between  two ambulation trials. Cues for step length, heel strike; initially off balance back and to left  Gait Pattern: Step-to pattern;Step-through pattern;Decreased step length - right;Decreased stance time - left;Shuffle;Trunk flexed   ADL:  ADL  Eating/Feeding: Performed;Independent  Where Assessed - Eating/Feeding: Chair  Grooming: Performed;Wash/dry hands;Wash/dry face  Where Assessed - Grooming: Supported standing  Upper Body Bathing: Minimal assistance  Where Assessed - Upper Body Bathing: Unsupported sitting  Lower Body Bathing: Maximal assistance  Where Assessed - Lower Body Bathing: Unsupported sitting;Supported sit to stand  Upper Body Dressing: Performed;Minimal assistance  Where Assessed - Upper Body Dressing: Unsupported sitting  Lower Body Dressing: Performed;Minimal assistance  Where Assessed - Lower Body Dressing: Unsupported sitting;Supported sit to stand  Toilet Transfer: Performed;Minimal assistance  Toilet Transfer Method: Stand pivot  Toilet Transfer Equipment: Raised toilet seat with arms (or 3-in-1 over toilet)  Equipment Used: Rolling walker  Transfers/Ambulation Related to ADLs: min assist to transfer with RW. Transferred sit to stand x4 reps and by last time pt was min guard. Pt stood each time for more than 45 seconds.  ADL Comments: Pt now able to access feet in sitting and did donn and doff socks with minimal assist.  Cognition:  Cognition  Arousal/Alertness: Awake/alert  Orientation Level: Oriented X4  Cognition  Overall Cognitive Status: Appears  within functional limits for tasks assessed/performed  Arousal/Alertness: Awake/alert  Orientation Level: Oriented X4 / Intact  Behavior During Session: [facility] for tasks performed  Cognition - Other Comments: appears grossly intact.   Physical Exam:  Blood pressure 113/65, pulse 86, temperature 97.5 F (36.4 C), temperature source Oral, resp. rate 18, height 6\' 1"  (1.854 m), weight 88.4 kg (194 lb 14.2 oz), SpO2 99.00%.    General: Alert and oriented x 3, appears a little fatigued. No distress. HEENT: Head is normocephalic, atraumatic, PERRLA, EOMI, sclera anicteric, oral mucosa pink and moist, dentition intact, ext ear canals clear,  Neck: Supple without JVD or lymphadenopathy Heart: Reg rate and rhythm. No murmurs rubs or gallops Chest: decreased breath sounds at bases. no wheezes, rales, or rhonchi; no distress Abdomen: Soft, non-tender, non-distended, bowel sounds positive. Tender around ostomy site, some irritation around the inferior border, appears fungal. Extremities: No clubbing, cyanosis, or edema. Pulses are 2+ Skin: wounds clean and well approximated. Ostomy well adhered. Neuro: Pt is cognitively appropriate with improved but still basic insight, memory, and awareness. Cranial nerves 2-12 are intact. Sensory exam appears intact for pinprick and light touch. Reflexes are 2+ in all 4's. Fine motor coordination is slightly diminished. Has mild intentional right greater than left tremors. Motor function is grossly 4/5 ue prox to distally. Lower ext 2+ to 3- at HF, 3 KE, 3-4 ADF and APf Musculoskeletal: Full ROM, No pain with  PROM in the neck, trunk, or extremities. Decreased truncal control as a whole Psych: Pt's affect is appropriate. Pt is cooperative. Conversationally fairly appropriate.       Results for orders placed during the hospital encounter of 05/30/12 (from the past 48 hour(s))   CBC Status: Abnormal    Collection Time    06/17/12 5:35 AM   Result  Value  Range    WBC   9.2  4.0 - 10.5 K/uL    RBC  3.10 (*)  4.22 - 5.81 MIL/uL    Hemoglobin  10.2 (*)  13.0 - 17.0 g/dL    HCT  82.9 (*)  56.2 - 52.0 %    MCV  94.2  78.0 - 100.0 fL  MCH  32.9  26.0 - 34.0 pg    MCHC  34.9  30.0 - 36.0 g/dL    RDW  16.1  09.6 - 04.5 %    Platelets  363  150 - 400 K/uL   GLUCOSE, CAPILLARY Status: Abnormal    Collection Time    06/17/12 4:40 PM   Result  Value  Range    Glucose-Capillary  109 (*)  70 - 99 mg/dL   BASIC METABOLIC PANEL Status: Abnormal    Collection Time    06/18/12 6:18 AM   Result  Value  Range    Sodium  134 (*)  135 - 145 mEq/L    Potassium  4.3  3.5 - 5.1 mEq/L    Chloride  98  96 - 112 mEq/L    CO2  28  19 - 32 mEq/L    Glucose, Bld  107 (*)  70 - 99 mg/dL    BUN  13  6 - 23 mg/dL    Creatinine, Ser  4.09  0.50 - 1.35 mg/dL    Calcium  9.1  8.4 - 10.5 mg/dL    GFR calc non Af Amer  78 (*)  >90 mL/min    GFR calc Af Amer  >90  >90 mL/min    Comment:      The eGFR has been calculated     using the CKD EPI equation.     This calculation has not been     validated in all clinical     situations.     eGFR's persistently     <90 mL/min signify     possible Chronic Kidney Disease.    Dg Chest Port 1 View  06/17/2012 *RADIOLOGY REPORT* Clinical Data: Fever PORTABLE CHEST - 1 VIEW Comparison: 06/06/2012 Findings: Left lower lobe consolidation remains and may represent pneumonia. Improvement small left effusion. Improved aeration in the right lung base. Improved lung volumes since the prior study. Negative for heart failure. IMPRESSION: Persistent left lower lobe consolidation, probable pneumonia Original Report Authenticated By: Janeece Riggers, M.D.   Post Admission Physician Evaluation:  1. Functional deficits secondary to deconditioning related to metastatic gastric cancer and exploratory lap. Post-op encephalopathy as well which is resolving 2. Patient is admitted to receive collaborative, interdisciplinary care between the physiatrist, rehab  nursing staff, and therapy team. 3. Patient's level of medical complexity and substantial therapy needs in context of that medical necessity cannot be provided at a lesser intensity of care such as a SNF. 4. Patient has experienced substantial functional loss from his/her baseline which was documented above under the "Functional History" and "Functional Status" headings. Judging by the patient's diagnosis, physical exam, and functional history, the patient has potential for functional progress which will result in measurable gains while on inpatient rehab. These gains will be of substantial and practical use upon discharge in facilitating mobility and self-care at the household level. 5. Physiatrist will provide 24 hour management of medical needs as well as oversight of the therapy plan/treatment and provide guidance as appropriate regarding the interaction of the two. 6. 24 hour rehab nursing will assist with bladder management, bowel management, safety, skin/wound care, disease management, medication administration, pain management and patient education and help integrate therapy concepts, techniques,education, etc. 7. PT will assess and treat for/with: Lower extremity strength, range of motion, stamina, balance, functional mobility, safety, adaptive techniques and equipment, NMR, education,. Goals are: supervision to mod I. 8. OT will assess and treat for/with: ADL's, functional mobility,  safety, upper extremity strength, adaptive techniques and equipment, NMR, education. Goals are: mod I to set up. 9. SLP will assess and treat for/with: cognition. Goals are: mod I to supervision. 10. Case Management and Social Worker will assess and treat for psychological issues and discharge planning. 11. Team conference will be held weekly to assess progress toward goals and to determine barriers to discharge. 12. Patient will receive at least 3 hours of therapy per day at least 5 days per week. 13. ELOS: 7-10  days Prognosis: excellent   Medical Problem List and Plan:  1. DVT Prophylaxis/Anticoagulation: Pharmaceutical: Xarelto  2. Pain Management: Limit narcotics due to sedative SE. Unable to tolerate oxycodone or morphine per reports. Will use tylenol prn and monitor for complaints.  3. Mood: seems to be appropriate. Wife and patient (to an extent aware of diagnosis) aware of diagnosis. Will have LCSW follow for support. Follow for sundowning at hs which seems to be improving 4. Neuropsych: This patient is generally capable of making decisions on his/her own behalf.  5. Hyponatremia: Will recheck in am.  6. CAF: Monitor HR with bid check. Continue Cardizem and Xarelto.  7. HTN: Will monitor with bid checks.  8. 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 Dysarthria fluctuates depending on fatigue.  9. GIB/ABLA: Hgb stable on pradexa. Will recheck in am and monitor for signs of bleeding.  10. LLL PNA: on levaquin D#1/ 7-10.  11. Stage IV metastatic AdenoCA of stomach: to follow up with Dr. Truett Perna past discharge?  12. Sundowning: Wife reports nights continue to be worse in part due to sleep wake disruption. This should hopefully resolve with increase in activity during the day. Follow sleep pattern. Sleep aid as needed. Will monitor closely.  Ranelle Oyster, MD, [place] [facility]  06/18/2012

## 2012-06-18 NOTE — Discharge Summary (Signed)
Physician Discharge Summary  Patient ID: [name] [name] MRN: [id number] DOB/AGE: [birth year] 79 y.o.  Admit date: 05/30/2012 Discharge date: 06/18/2012  Primary Care Physician:  Miguel Aschoff, MD  Discharge Diagnoses:   . Gastric adenocarcinoma- New diagnosis . Atrial fibrillation, rapid . GI bleed . Gastroparesis . Acute respiratory distress . Syncope . Paroxysmal a-fib/flutter . Delerium exacerbated by acute illness, morphine  . CVA (cerebral infarction) . Anemia of chronic disease  Consults: General surgery, Dr. Janee Morn                    Cardiology, [facility]                    Oncology, Dr. Truett Perna                      Palliative medicine                     Wound care                       Neurology                       Critical care.   Discharge Medications:   Medication List    STOP taking these medications       dabigatran 150 MG Caps  Commonly known as:  PRADAXA     metoCLOPramide 5 MG tablet  Commonly known as:  REGLAN      TAKE these medications       b complex vitamins capsule  Take 1 capsule by mouth daily.     CARDIZEM CD 120 MG 24 hr capsule  Generic drug:  diltiazem  Take 120 mg by mouth every morning.     Fish Oil 1000 MG Caps  Take 1 capsule by mouth daily.     omeprazole 20 MG capsule  Commonly known as:  PRILOSEC  Take 1 capsule by mouth 2 (two) times daily.     PRESERVISION AREDS PO  Take 1 tablet by mouth 2 (two) times daily.     rosuvastatin 10 MG tablet  Commonly known as:  CRESTOR  Take 10 mg by mouth daily.     silodosin 8 MG Caps capsule  Commonly known as:  RAPAFLO  Take 8 mg by mouth daily with breakfast.     sucralfate 1 G tablet  Commonly known as:  CARAFATE  Take 1 g by mouth 3 (three) times daily before meals.     VITAMIN D-3 PO  Take 1 tablet by mouth daily.       Inpatient hospital meds to be continued at the time of final discharge: Iron sulfate 325 mg BID Percocet 5/325 mg 1 tab every 4 hours as  needed Xarelto 20 mg daily with supper Restoril 7.5 mg qhs PRN Vitamin C 500mg  BID  Brief H and P: For complete details please refer to admission H and P on 05/30/2012, but in brief Patient is a 79 year old male with multiple medical problems including chronic atrial fibrillation on pradaxa, stroke, hypertension, gastroparesis presented to ED with above complaints. History was obtained from the patient and his wife present in the room. Apparently patient's the symptoms started 2 days prior to admission with abdominal pain, diffusely with cramping sensation, 7-8/10. Subsequently he noticed that his abdomen was getting distended. He was also having intermittent diarrhea recently in last month and saw his gastroenterologist (Dr. Madilyn Fireman).  Patient stated that he was advised lactose-free diet but that did not improve the diarrhea. On the morning of admission, patient was feeling dizzy, lightheaded and had an episode of diarrhea. He had a syncopal episode, patient denied hitting his head and was witnessed by his wife. Per his wife, he was out for only a few seconds. When patient presented to the ED, he was noted to be in rapid A. Fib, HR in 130's, hypotensive BP in 80's, he was placed on IV fluids. Patient had 2 episodes of coffee-ground emesis in the ED. FOBT was positive in ED.  CT scan of the abdomen was obtained due to the abdominal distention and coffee-ground emesis which showed moderate ascites with diffuse fluid filled and dilated small bowel likely mechanical small bowel obstruction with ischemic/infectious and inflammatory etiologies.   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 [facility] and oncology has been closely following the patient. Patient underwent loop ileostomy, lymph node biopsy and peritoneal fluid sampling. He is today 5 days postop, has good ostomy output but continues to have leaking ostomy pouch. Wound care has been closely working with ileostomy care. Unfortunately peritoneal/ascitic fluid also came back positive for adenocarcinoma (see path report 3/31).  PET scan as OP. Palliative medicine was also consulted on 06/07/12. Please see goals of care note on 3/21: Patient remains FULL CODE STATUS and continue with all medical interventions and explore treatment options for newly diagnosed cancer with Dr. Truett Perna. Please make an appointment with Dr. Truett Perna at discharge.    Hematemesis / gastric CA / anorexia:-no evidence of bleeding on Xarelto (chosen for decreased risk of GIB) at this time, [facility] has approved resume post op.   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 CT Head was negative on 3/18. He had apparent admit for CVA Jan 2014 in [facility] and was also admitted to [facility] Jan 2014 for expressive aphasia   Acute delerium: worse during night, sundowning and likely secondary to acute illness, narcotics. The patient was placed on IV morphine and Roxanol however he is noted to have worsening of his mental status after morphine. Patient and his wife requested to discontinue morphine and keep on Tylenol or Percocet PRN.   Rapid atrial fibrillation/flutter -  currently tolerating oral Cardizem 120mg  daily and xarelto   Elevated cardiac enzymes -Likely due to rapid heart rate/ demand ischemia - normalized as of 3/19, 2-D echocardiogram was done on 3/18 which showed EF of 50-55%.  Hx of HYPERTENSION, BENIGN SYSTEMIC not an active issue at this time   DVT  Prophylaxis:xarelto   Procedures:  EGD (06/03/12)  1. Mucosal edema and slight nonerosive gastric erythema. Findings are nonspecific.  2.? Obstructive sleep apnea based on tolerance of procedure as described above   Colonoscopy (06/03/12)  1. Significant mucosal edema and spasm in the mid transverse colon. The etiology is not clear, but I wonder about ischemia. Efforts to advance the scope more proximally were unsuccessful within the constraints of the exam in terms of patient sedation tolerance, see above discussion.  PATH REPORT: Metastatic adenocarcinoma  Day of Discharge BP 113/65  Pulse 86  Temp(Src) 97.5 F (36.4 C) (Oral)  Resp 18  Ht 6\' 1"  (1.854 m)  Wt 88.4 kg (194 lb 14.2 oz)  BMI 25.72 kg/m2  SpO2 99%  Physical Exam:  General: A x O x 3, no acute distress, chronic facial drooping  CVS: S1-S2 clear, ireg  Chest: CTAB  Abdomen: soft nontender, ileostomy  Extremities: no c/c/e bilaterally   The results of significant diagnostics from this hospitalization (including imaging, microbiology, ancillary and laboratory) are listed below for reference.    LAB RESULTS: Basic Metabolic Panel:  Recent Labs Lab 06/18/12 0618  NA 134*  K 4.3  CL 98  CO2 28  GLUCOSE 107*  BUN 13  CREATININE 0.91  CALCIUM 9.1  CBC:  Recent Labs Lab 06/14/12 0526 06/17/12 0535  WBC 8.7 9.2  HGB 10.2* 10.2*  HCT 29.2* 29.2*  MCV 95.4 94.2  PLT 338 363   Cardiac Enzymes: No results found for this basename: CKTOTAL, CKMB, CKMBINDEX, TROPONINI,  in the last 168 hours BNP: No components found with this basename: POCBNP,  CBG:  Recent Labs Lab 06/13/12 1307 06/17/12 1640  GLUCAP 99 109*    Significant Diagnostic Studies:  Ct Abdomen Pelvis W Contrast  05/30/2012  *RADIOLOGY REPORT*  Clinical Data: Near-syncope.  Abdominal pain with bloating and diarrhea.  CT ABDOMEN AND PELVIS WITH CONTRAST  Technique:  Multidetector CT imaging of the abdomen and pelvis was performed  following the standard protocol during bolus administration of intravenous contrast.  Contrast: 80mL OMNIPAQUE IOHEXOL 300 MG/ML  SOLN  Comparison: None.  Findings: Lung bases show dependent atelectasis bilaterally.  Heart size normal.  No pericardial effusion.  There may be trace left pleural fluid.  Distal esophagus is dilated and fluid-filled.  Intrahepatic biliary duct dilatation.  Extrahepatic bile duct is somewhat difficult to follow but is likely within normal limits. Intermediate attenuation material layers in the gallbladder.  Right kidney is unremarkable.  Low attenuation lesions in the left kidney measure up to 1.5 cm and are likely cysts.  Spleen, pancreas, stomach and duodenum are unremarkable.  There is rather diffuse distention of fluid-filled small bowel, without a discrete transition point.  The cecum and proximal ascending colon are fluid filled and dilated as well.  However, there is a fairly long segment of under distention of the colon with noticeable wall thickening involving the distal ascending, transverse and proximal descending portions.  The distal descending and rectosigmoid colon are minimally dilated and contains fluid.  Moderate ascites.  Mesenteric edema.  No definite pathologically enlarged lymph nodes.  Atherosclerotic calcification of the arterial vasculature.  Prostate is enlarged.  No worrisome lytic or sclerotic lesions.  IMPRESSION:  1.  Long segment of nondistended and thickened colon, involving the ascending, transverse and descending portions, with diffuse fluid filled and dilated small bowel.  Ischemic and infectious/inflammatory etiologies involving the colon are considered, with resultant mechanical small bowel obstruction. Critical Value/emergent results were called by telephone at the time of interpretation on 05/30/2012 at 1225 hours to Dr. Radford Pax, who verbally acknowledged these results. 2.  Tiny left pleural effusion and moderate ascites. 3.  Intrahepatic biliary  duct dilatation, of uncertain etiology. 4.  Question gallbladder sludge. 5.  Prostate enlargement.   Original Report Authenticated By: Leanna Battles, M.D.    Dg Chest Port 1 View  05/31/2012  *RADIOLOGY REPORT*  Clinical Data: Ascites.  Pleural effusion.  PORTABLE CHEST - 1 VIEW  Comparison: 05/10/2012.  Findings: Nasogastric tube gastric fundus level.  Bibasilar atelectasis.  Poor inspiration.  Limited evaluation lung bases.  Central pulmonary vascular prominence.  Tortuous aorta.  No gross pneumothorax.  Heart size top normal.  IMPRESSION: Poor inspiration with bibasilar atelectatic changes.  Limited evaluation lung bases.  Calcified tortuous aorta.  Central pulmonary vascular prominence.   Original Report Authenticated By: Lacy Duverney, M.D.     2D ECHO: Study Conclusions  - Left ventricle: The cavity size was mildly dilated. Wall thickness was normal. Systolic function was normal. The estimated ejection fraction was in the range of 50% to 55%. - Left atrium: The atrium was mildly dilated.    Disposition and Follow-up:  Future Appointments Provider Department Dept Phone   06/26/2012 2:00 PM Kathleene Hazel, MD [facility] Main Office [place]) [phone]   09/25/2012 3:00 PM Micki Riley, MD [facility] [phone]       DISPOSITION: Inpatient rehabilitation DIET: Regular diet  ACTIVITY: As tolerated  DISCHARGE FOLLOW-UP Follow-up Information   Follow up with [facility], MD. Schedule an appointment as soon as possible for a visit in 2 weeks.   Contact information:   [address] [place] [address] [phone]       Follow up with Thornton Papas, MD. Schedule an appointment as soon as possible for a visit in 2 weeks.   Contact information:   [address] [place] [address] [phone]       Time spent on Discharge: 48 MINS  Signed:   Nadira Single M.D. [facility] 06/18/2012, 1:26  PM Pager: [phone]

## 2012-06-18 NOTE — Progress Notes (Signed)
5 Days Post-Op  Subjective: Pt feels good, no complaints.  Ambulating, tolerating diet, good ostomy output.  Waiting for a bed at rehab floor.  Objective: Vital signs in last 24 hours: Temp:  [97.7 F (36.5 C)-101.8 F (38.8 C)] 98.6 F (37 C) (04/01 0553) Pulse Rate:  [71-84] 84 (04/01 0553) Resp:  [18-21] 18 (04/01 0553) BP: (95-128)/(59-76) 127/74 mmHg (04/01 0553) SpO2:  [83 %-98 %] 94 % (04/01 0553) Last BM Date: 06/17/12  Intake/Output from previous day: 03/31 0701 - 04/01 0700 In: 643 [P.O.:240; I.V.:403] Out: 700 [Urine:450; Stool:250] Intake/Output this shift:    PE: Gen:  Alert, NAD, pleasant Abd: Soft, NT/ND, +BS, no HSM, incisions C/D/I, ostomy patent with greenish brown BM Neck:  Biopsy site - derma bond in place   Lab Results:   Recent Labs  06/17/12 0535  WBC 9.2  HGB 10.2*  HCT 29.2*  PLT 363   BMET  Recent Labs  06/18/12 0618  NA 134*  K 4.3  CL 98  CO2 28  GLUCOSE 107*  BUN 13  CREATININE 0.91  CALCIUM 9.1   PT/INR No results found for this basename: LABPROT, INR,  in the last 72 hours CMP     Component Value Date/Time   NA 134* 06/18/2012 0618   K 4.3 06/18/2012 0618   CL 98 06/18/2012 0618   CO2 28 06/18/2012 0618   GLUCOSE 107* 06/18/2012 0618   BUN 13 06/18/2012 0618   CREATININE 0.91 06/18/2012 0618   CALCIUM 9.1 06/18/2012 0618   PROT 5.0* 06/09/2012 0555   ALBUMIN 2.2* 06/09/2012 0555   AST 27 06/09/2012 0555   ALT 18 06/09/2012 0555   ALKPHOS 144* 06/09/2012 0555   BILITOT 0.4 06/09/2012 0555   GFRNONAA 78* 06/18/2012 0618   GFRAA >90 06/18/2012 0618   Lipase  No results found for this basename: lipase       Studies/Results: Dg Chest Port 1 View  06/17/2012  *RADIOLOGY REPORT*  Clinical Data: Fever  PORTABLE CHEST - 1 VIEW  Comparison: 06/06/2012  Findings: Left lower lobe consolidation remains and may represent pneumonia.  Improvement small left effusion.  Improved aeration in the right lung base.  Improved lung volumes since the  prior study.  Negative for heart failure.  IMPRESSION: Persistent left lower lobe consolidation, probable pneumonia   Original Report Authenticated By: Janeece Riggers, M.D.     Anti-infectives: Anti-infectives   Start     Dose/Rate Route Frequency Ordered Stop   06/13/12 1342  ciprofloxacin (CIPRO) 400 MG/200ML IVPB    Comments:  MUMM,VALERIE: cabinet override      06/13/12 1342 06/13/12 1412   06/13/12 0000  ciprofloxacin (CIPRO) IVPB 400 mg    Comments:  On call to OR   400 mg 200 mL/hr over 60 Minutes Intravenous  Once 06/12/12 0834 06/13/12 0018   06/13/12 0000  metroNIDAZOLE (FLAGYL) IVPB 500 mg    Comments:  On call to OR   500 mg 100 mL/hr over 60 Minutes Intravenous  Once 06/12/12 0834 06/13/12 0017   05/30/12 2200  ciprofloxacin (CIPRO) IVPB 400 mg  Status:  Discontinued     400 mg 200 mL/hr over 60 Minutes Intravenous Every 12 hours 05/30/12 2123 06/04/12 0746   05/30/12 2200  metroNIDAZOLE (FLAGYL) IVPB 500 mg  Status:  Discontinued     500 mg 100 mL/hr over 60 Minutes Intravenous Every 8 hours 05/30/12 2123 06/04/12 0746       Assessment/Plan 1. Gastric adenocarcinoma with metastatic  disease  2. POD #5 S/p diverting loop ileostomy secondary to colonic obstruction from #1  3.  Mild Hyponatremia 134 - medicine following  Plan:  1. Cytology reveals adenocarcinoma  2. Cont with regular diet  3. Further treatment care per dr. Truett Perna.  4. CIR soon     LOS: 19 days    DORT, Bettie Swavely 06/18/2012, 8:54 AM Pager: [phone]

## 2012-06-18 NOTE — PMR Pre-admission (Signed)
PMR Admission Coordinator Pre-Admission Assessment  Patient: [name] [name] is an 79 y.o., male MRN: [id number] DOB: 11-21-1930 Height: 6\' 1"  (185.4 cm) Weight: 88.4 kg (194 lb 14.2 oz)              Insurance Information HMO: yes    PPO:      PCP:      IPA:      80/20:      OTHER:  PRIMARY: Blue Medicare      Policy#: [id number]      Subscriber: self CM Name: Santina Evans      Phone#: [phone]     Fax#: [phone] Pre-Cert#: [id number]      Employer: retired Benefits:  Phone #: [phone]     Name: Mammie Russian. Date: 04/19/12     Deduct: 0      Out of Pocket Max: $3400      Life Max: none CIR: 80/20%  Day 1-7 copay= $220 / Day 8+ = 0      SNF: Copay: Day 1-10= $0 / Day 11-100 = $50 Outpatient: no limits     Co-Pay: $40 copay Home Health: 100%      Co-Pay: 0 DME: 80/20%     Co-Pay: 0 Providers: in network  Emergency Contact Information Contact Information   Name Relation Home Work Mobile   Zamarron,Nancy Spouse [phone]  [phone]     Current Medical History  Patient Admitting Diagnosis: Metastatic gastric cancer s/p ex lap with subsequent deconditioning, encephalopathy  History of Present Illness: 79 y.o. male with h/o CVA 12/12 and recently 1/14, CAF on pradaxa, h/o of gastritis who was admitted on 05/30/12 with A Fib with RVR and GIB. CT suggestive of ischemic colitis and ileus with diffuse ascites. Started on IV cardizem for HR control. EGD with biopsy done positive for gastric CA with mets to colon. He did develop respiratory failure due to pulmonary edema and symptoms improved with diuresis. Dr. Darnelle Catalan consulted for input on stage 4 stomach cancer and treatment options discussed with recommendations for input from palliative care. On 03/21, Neurology consulted due to delirium with visual hallucinations and as CT negative recommended MRI brain for workup once stable. Patient elected to undergo exp lap with diverting loop ileostomy on 06/13/12 by Dr. Janee Morn. Post op  Xarelto initiated. Mentation continues to fluctuate with confusion at night. Tolerating po's and atrial fibrillation controlled on po Cardizem.     Past Medical History  Past Medical History  Diagnosis Date  . Ramsay Hunt auricular syndrome   . HTN (hypertension)   . Osteoarthritis   . Dysrhythmia 04/17/12  . Anxiety   . Stroke   . H/O Bell's palsy at age 72  . Atrial flutter   . Atrial fibrillation   . Ileostomy in place 06/14/2012    Family History  family history includes Cancer in his father and Diabetes in his father.  Prior Rehab/Hospitalizations: none   Current Medications  Current facility-administered medications:acetaminophen (TYLENOL) suppository 650 mg, 650 mg, Rectal, Q6H PRN, Ripudeep K Rai, MD;  acetaminophen (TYLENOL) tablet 650 mg, 650 mg, Oral, Q6H PRN, Lonia Blood, MD, 650 mg at 06/17/12 1702;  antiseptic oral rinse (BIOTENE) solution 15 mL, 15 mL, Mouth Rinse, BID, Currie Paris, MD, 15 mL at 06/18/12 0846 bismuth subsalicylate (PEPTO BISMOL) 262 MG/15ML suspension 30 mL, 30 mL, Oral, Q8H PRN, Ardeth Sportsman, MD;  diltiazem (CARDIZEM CD) 24 hr capsule 120 mg, 120 mg, Oral, Daily, Kathleene Hazel, MD, 120 mg  at 06/18/12 1001;  feeding supplement (ENSURE COMPLETE) liquid 237 mL, 237 mL, Oral, TID WC, Ashley Jacobs, RD, 237 mL at 06/18/12 1002;  ferrous sulfate tablet 325 mg, 325 mg, Oral, BID WC, Ardeth Sportsman, MD, 325 mg at 06/18/12 0845 Gerhardt's butt cream, , Topical, BID, Letha Cape, PA-C;  levofloxacin [facility]) tablet 750 mg, 750 mg, Oral, Daily, Ripudeep K Rai, MD, 750 mg at 06/18/12 1058;  loperamide (IMODIUM) capsule 2-4 mg, 2-4 mg, Oral, Q8H PRN, Ardeth Sportsman, MD;  naloxone [facility]) injection 0.4 mg, 0.4 mg, Intravenous, Once, Ripudeep K Rai, MD;  naloxone [facility]) injection 0.4 mg, 0.4 mg, Intravenous, Once, Ripudeep Jenna Luo, MD ondansetron (ZOFRAN) injection 4 mg, 4 mg, Intravenous, Q6H PRN, Ripudeep K Rai, MD, 4 mg at 06/02/12  0808;  oxyCODONE-acetaminophen (PERCOCET/ROXICET) 5-325 MG per tablet 1 tablet, 1 tablet, Oral, Q4H PRN, Ripudeep K Rai, MD;  pantoprazole (PROTONIX) EC tablet 40 mg, 40 mg, Oral, BID AC, Lonia Blood, MD, 40 mg at 06/18/12 0846;  Rivaroxaban (XARELTO) tablet 20 mg, 20 mg, Oral, Q supper, Calvert Cantor, MD, 20 mg at 06/17/12 1650 sodium chloride 0.9 % injection 3 mL, 3 mL, Intravenous, Q12H, Ripudeep K Rai, MD, 3 mL at 06/18/12 1004;  temazepam (RESTORIL) capsule 7.5 mg, 7.5 mg, Oral, QHS, Ripudeep K Rai, MD;  vitamin C (ASCORBIC ACID) tablet 500 mg, 500 mg, Oral, BID, Ardeth Sportsman, MD, 500 mg at 06/18/12 1001  Patients Current Diet: General  Precautions / Restrictions Precautions Precautions: Fall Precaution Comments: ileostomy, abdominal incisional wound Restrictions Weight Bearing Restrictions: No   Prior Activity Level Community (5-7x/wk): Very active daily Home Banker / Equipment Home Assistive Devices/Equipment: Eyeglasses Home Adaptive Equipment: None  Prior Functional Level Prior Function Level of Independence: Independent Driving: Yes Vocation: Retired Comments: Pt took long walks daily and worked out at [employer]. Pt was a marathon runner in past.  Current Functional Level Cognition  Arousal/Alertness: Awake/alert Overall Cognitive Status: Appears within functional limits for tasks assessed/performed Orientation Level: Oriented X4 Cognition - Other Comments: appears grossly intact.     Extremity Assessment (includes Sensation/Coordination)  RUE ROM/Strength/Tone: Deficits RUE ROM/Strength/Tone Deficits: AROM to 100 degrees in supine shoulder FF, full elbow to hand, first finger amputated RUE Sensation: WFL - Light Touch;WFL - Proprioception RUE Coordination: WFL - gross/fine motor  RLE ROM/Strength/Tone: Deficits RLE ROM/Strength/Tone Deficits: AROM WFL, strength hip flexion 4-/5, knee extension 4/5, ankle dorsiflexion 4/5 RLE Sensation: WFL - Light  Touch    ADLs  Eating/Feeding: Performed;Independent Where Assessed - Eating/Feeding: Chair Grooming: Performed;Wash/dry hands;Wash/dry face Where Assessed - Grooming: Supported standing Upper Body Bathing: Minimal assistance Where Assessed - Upper Body Bathing: Unsupported sitting Lower Body Bathing: Maximal assistance Where Assessed - Lower Body Bathing: Unsupported sitting;Supported sit to stand Upper Body Dressing: Performed;Minimal assistance Where Assessed - Upper Body Dressing: Unsupported sitting Lower Body Dressing: Performed;Minimal assistance Where Assessed - Lower Body Dressing: Unsupported sitting;Supported sit to stand Toilet Transfer: Performed;Minimal assistance Toilet Transfer: Patient Percentage: 70% Toilet Transfer Method: Stand pivot Acupuncturist: Raised toilet seat with arms (or 3-in-1 over toilet) Toileting - Clothing Manipulation and Hygiene: Performed;Minimal assistance Where Assessed - Engineer, mining and Hygiene: Standing Equipment Used: Rolling walker Transfers/Ambulation Related to ADLs: min assist to transfer with RW.  Transferred sit to stand x4 reps and by last time pt was min guard.  Pt stood each time for more than 45 seconds. ADL Comments: Pt now able to access feet in sitting  and did donn and doff socks with minimal assist.      Mobility  Bed Mobility: Rolling Right;Right Sidelying to Sit;Sitting - Scoot to Edge of Bed Rolling Right: 4: Min assist;With rail Right Sidelying to Sit: 4: Min assist;HOB flat;With rails Supine to Sit: 3: Mod assist Sitting - Scoot to Edge of Bed: 5: Supervision Sit to Supine: 3: Mod assist    Transfers  Transfers: Sit to Stand;Stand to Sit;Stand Pivot Transfers Sit to Stand: 4: Min assist;From bed;With armrests Sit to Stand: Patient Percentage: 70% Stand to Sit: 4: Min assist;To chair/3-in-1;With armrests Stand Pivot Transfers: 4: Min assist    Ambulation / Gait / Stairs / Wheelchair  Mobility  Ambulation/Gait Ambulation/Gait Assistance: 3: Mod assist Ambulation Distance (Feet): 22 Feet (and 12') Assistive device: Rolling walker Ambulation/Gait Assistance Details: seated rest between two ambulation trials. Cues for step length, heel strike; initially off balance back and to left Gait Pattern: Step-to pattern;Step-through pattern;Decreased step length - right;Decreased stance time - left;Shuffle;Trunk flexed    Posture / Balance Static Sitting Balance Static Sitting - Balance Support: Bilateral upper extremity supported;Feet supported Static Sitting - Level of Assistance: 5: Stand by assistance Static Sitting - Comment/# of Minutes: 5 Static Standing Balance Static Standing - Balance Support: Bilateral upper extremity supported Static Standing - Level of Assistance: 4: Min assist Static Standing - Comment/# of Minutes: 2 x3 trials.    Special needs/care consideration Continuous Drip IV: yes @ 37ml/hr Oxygen: 1L via n/c Skin: WDL                        Bowel mgmt:New ileostomy Bladder mgmt: WDL Ileostomy loop at RUQ (on 3/27). Ostomy bag leaking at times. WOC nurse following. Patient and spouse will require education on ostomy care.   Previous Home Environment Living Arrangements: Spouse/significant other Lives With: Spouse Available Help at Discharge: Available 24 hours/day Type of Home: House Home Layout: Two level;Able to live on main level with bedroom/bathroom Alternate Level Stairs-Number of Steps: can stay on main Home Access: Stairs to enter Entrance Stairs-Number of Steps: 3 Bathroom Shower/Tub: Health visitor: Standard Home Care Services: No  Discharge Living Setting Plans for Discharge Living Setting: Patient's home Type of Home at Discharge: House Discharge Home Layout: Two level;Able to live on main level with bedroom/bathroom Alternate Level Stairs-Number of Steps: flight Discharge Home Access: Stairs to enter Entrance  Stairs-Number of Steps: 3 Discharge Bathroom Shower/Tub: Walk-in shower Discharge Bathroom Toilet: Standard Discharge Bathroom Accessibility: Yes How Accessible: Accessible via walker Do you have any problems obtaining your medications?: No  Social/Family/Support Systems Patient Roles: Spouse;Parent;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 Will need Palliative Care consult prior to d/c home. May need hospice care. Patient has questions regarding his long term care policy coverage.   Goals/Additional Needs Patient/Family Goal for Rehab: PT&OT: Min assist - supervision Expected length of stay: 8-12 days Cultural Considerations: none Equipment Needs: TBD Pt/Family Agrees to Admission and willing to participate: Yes Program Orientation Provided & Reviewed with Pt/Caregiver Including Roles  & Responsibilities: Yes  Decrease burden of Care through IP rehab admission: Specialzed equipment needs, Decrease number of caregivers, Bowel/ileostomy care  and Patient/family education.  Possible need for SNF placement upon discharge: No  Patient Condition: This patient's condition remains as documented in the consult dated 06/17/12, in which the Rehabilitation Physician determined and documented that the patient's condition is appropriate for intensive rehabilitative care in an inpatient rehabilitation facility. Will admit to inpatient rehab today.  Preadmission Screen Completed By:  Meryl Dare, 06/18/2012 11:34 AM ______________________________________________________________________    Discussed status with Dr. Riley Kill on 06/18/12 at 1200 PM and received telephone approval for admission today.  Admission Coordinator:  Meryl Dare, time 1200 PM/Date 06/18/12

## 2012-06-18 NOTE — Progress Notes (Signed)
Pt arrived on unit from 6N, assigned to room 4011

## 2012-06-18 NOTE — Consult Note (Signed)
WOC ostomy follow up Stoma type/location: RLQ, loop ileostomy with support rod. This is making pouching more difficult at the current time. Called to the bedside for leaking ostomy pouch, has been change 3 x per bedside nursing staff including my teaching session with wife yesterday. Stomal assessment/size:  1 3/4" round with functional stoma os pointing towards 11 o'clock Peristomal assessment: beginning to have some denudation, but skin still intact at this time.  Noted some candida in at the edge of the pubic hair and along the lower abdomen.  Antifungal powder initiated for this area.    Treatment options for stomal/peristomal skin: utilized 2" barrier ring cut in half over the support rod on each side, then an additional 2" barrier ring cut in half along the upper and lower aspect of the stoma, the stoma is budded from the skin nicely. Output liquid green, not necessarily high output. I considered placing to BSD if the issue was the volume of output making the pouch leak however the nursing staff state the output has not been tremendous. Ostomy pouching: 2pc. 2 3/4" cut to fit with use of 2" barrier rings over the support rod and around the stoma.  WOC will follow along closely for continued problems. Pt to inpatient rehab today per nurse and wife.   [name] [name] Sumas RN,CWOCN [phone]

## 2012-06-18 NOTE — Progress Notes (Signed)
Physical Therapy Treatment Patient Details Name: [name] [name] MRN: [id number] DOB: [birth year] Today's Date: 06/18/2012 Time: 1225-1250 PT Time Calculation (min): 25 min  PT Assessment / Plan / Recommendation Comments on Treatment Session  Pt mobility, strength and activity tolerance much improved.       Follow Up Recommendations  CIR     Does the patient have the potential to tolerate intense rehabilitation     Barriers to Discharge        Equipment Recommendations  Rolling walker with 5" wheels    Recommendations for Other Services Rehab consult  Frequency Min 3X/week   Plan Discharge plan remains appropriate    Precautions / Restrictions Precautions Precautions: Fall Precaution Comments: ileostomy, abdominal incisional wound Restrictions Weight Bearing Restrictions: No   Pertinent Vitals/Pain No c/o pain.     Mobility  Bed Mobility Bed Mobility: Rolling Right;Right Sidelying to Sit Rolling Right: 4: Min guard Right Sidelying to Sit: 4: Min guard;HOB flat Details for Bed Mobility Assistance: vcs for technique no assistance required.  Transfers Transfers: Sit to Stand;Stand to Sit Sit to Stand: 4: Min guard;From bed;From chair/3-in-1;With upper extremity assist Stand to Sit: 4: Min guard;To chair/3-in-1 Stand Pivot Transfers: Not tested (comment) Details for Transfer Assistance: Vcs for technique.  Ambulation/Gait Ambulation/Gait Assistance: 4: Min guard Ambulation Distance (Feet): 290 Feet (90 feet; 200 feet) Assistive device: Rolling walker Ambulation/Gait Assistance Details: Vcs for upright posture, 1 sitting rest break  4 minutes between trials.   Gait Pattern: Step-through pattern;Decreased stride length Stairs: No    Exercises     PT Diagnosis:    PT Problem List:   PT Treatment Interventions:     PT Goals Acute Rehab PT Goals PT Goal Formulation: With patient/family Time For Goal Achievement: 06/21/12 Potential to Achieve Goals: Good Pt will  go Supine/Side to Sit: with modified independence PT Goal: Supine/Side to Sit - Progress: Progressing toward goal Pt will go Sit to Supine/Side: with min assist PT Goal: Sit to Supine/Side - Progress: Met Pt will go Sit to Stand: with supervision PT Goal: Sit to Stand - Progress: Progressing toward goal Pt will go Stand to Sit: with supervision PT Goal: Stand to Sit - Progress: Progressing toward goal Pt will Stand: with min assist;3 - 5 min;with unilateral upper extremity support PT Goal: Stand - Progress: Met Pt will Ambulate: with min assist;with least restrictive assistive device;with supervision;>150 feet PT Goal: Ambulate - Progress: Met Pt will Go Up / Down Stairs: 3-5 stairs;with min assist PT Goal: Up/Down Stairs - Progress: Not met  Visit Information  Last PT Received On: 06/18/12    Subjective Data  Patient Stated Goal: To return home   Cognition  Cognition Overall Cognitive Status: Appears within functional limits for tasks assessed/performed Arousal/Alertness: Awake/alert Orientation Level: Oriented X4 / Intact Behavior During Session: [facility] for tasks performed Cognition - Other Comments: appears grossly intact.     Balance  Balance Balance Assessed: No  End of Session PT - End of Session Equipment Utilized During Treatment: Gait belt Activity Tolerance: Patient tolerated treatment well Patient left: in chair;with call bell/phone within reach;with family/visitor present Nurse Communication: Other (comment)   GP     Alferd Apa 06/18/2012, 3:50 PM Shirleen Mcfaul L. Mahasin Riviere DPT [phone]

## 2012-06-18 NOTE — Progress Notes (Signed)
Patient transferred to Inpatient rehab.  Report called to RN.  Family aware of transfer.  Patient alert and oriented, vital signs stable, no complaints of pain.Transferred per wheelchair.

## 2012-06-18 NOTE — Progress Notes (Signed)
    SUBJECTIVE: No complaints.   BP 127/74  Pulse 84  Temp(Src) 98.6 F (37 C) (Oral)  Resp 18  Ht 6\' 1"  (1.854 m)  Wt 194 lb 14.2 oz (88.4 kg)  BMI 25.72 kg/m2  SpO2 94%  Intake/Output Summary (Last 24 hours) at 06/18/12 0827 Last data filed at 06/17/12 2159  Gross per 24 hour  Intake    643 ml  Output    700 ml  Net    -57 ml    PHYSICAL EXAM General: Well developed, well nourished, in no acute distress. Alert and oriented x 3.  Psych:  Good affect, responds appropriately Neck: No JVD. No masses noted.  Lungs: Clear bilaterally with no wheezes or rhonci noted.  Heart: RRR with no murmurs noted. Abdomen: Bowel sounds are present. Soft, non-tender.  Extremities: No lower extremity edema.   LABS: Basic Metabolic Panel:  Recent Labs  16/10/96 0618  NA 134*  K 4.3  CL 98  CO2 28  GLUCOSE 107*  BUN 13  CREATININE 0.91  CALCIUM 9.1   CBC:  Recent Labs  06/17/12 0535  WBC 9.2  HGB 10.2*  HCT 29.2*  MCV 94.2  PLT 363   Current Meds: . antiseptic oral rinse  15 mL Mouth Rinse BID  . diltiazem  120 mg Oral Daily  . feeding supplement  237 mL Oral TID WC  . ferrous sulfate  325 mg Oral BID WC  . Gerhardt's butt cream   Topical BID  . naLOXone (NARCAN)  injection  0.4 mg Intravenous Once  . naLOXone (NARCAN)  injection  0.4 mg Intravenous Once  . pantoprazole  40 mg Oral BID AC  . rivaroxaban  20 mg Oral Q supper  . sodium chloride  3 mL Intravenous Q12H  . vitamin C  500 mg Oral BID    ASSESSMENT AND PLAN:  1. Paroxysmal atrial fibrillation: One brief run of atrial fibrillation 06/16/12 am with rate control. Maintaining sinus rhythm this am. He is on Cardizem CD and Xarelto.   2. Stage 4 metastatic adenocarcinoma of stomach: Status post diverting loop ileostomy with scalene node biopsy. Further plans per oncology/surgical teams.     Tyeisha Dinan  4/1/20148:27 AM

## 2012-06-18 NOTE — Progress Notes (Signed)
PHYSICAL THERAPY PROGRESS NOTE 06/18/2012   06/18/12 1100  PT Visit Information  Last PT Received On 06/18/12  Assistance Needed +1  PT Time Calculation  PT Start Time 1139  PT Stop Time 1156  PT Time Calculation (min) 17 min  Subjective Data  Subjective I am pooped.  Been in bed since March 13th.   Patient Stated Goal To return home  Precautions  Precautions Fall  Precaution Comments ileostomy, abdominal incisional wound  Restrictions  Weight Bearing Restrictions No  Cognition  Overall Cognitive Status Appears within functional limits for tasks assessed/performed  Arousal/Alertness Awake/alert  Orientation Level Oriented X4 / Intact  Behavior During Session [facility] for tasks performed  Cognition - Other Comments appears grossly intact.   Bed Mobility  Bed Mobility Sit to Sidelying Right  Details for Bed Mobility Assistance vcs for technique no assistance required.   Transfers  Transfers Sit to Stand;Stand to [employer] Transfers  Sit to Stand 4: Min assist;From chair/3-in-1;With upper extremity assist  Sit to Stand: Patient Percentage 80%  Stand to Sit 4: Min assist;To chair/3-in-1;With armrests  Stand Pivot Transfers 3: Mod assist  Details for Transfer Assistance vcs for hand placement and safe technique.  Assist to steady pt with stand pivot transfer secondary to bilateral LE weakness.    Ambulation/Gait  Ambulation/Gait Assistance Not tested (comment)  Balance  Balance Assessed No  PT - End of Session  Equipment Utilized During Treatment Gait belt  Activity Tolerance Treatment limited secondary to medical complications (Comment) (Illiostomoy bag leaking.  )  Patient left in bed;with nursing in room;with family/visitor present  Nurse Communication Other (comment)  PT - Assessment/Plan  Comments on Treatment Session Session limited by pt's illiostomy bag leaking. RN notified and replacing bag.  Will attempt to see pt again after bag is changed.    PT Plan Discharge plan  remains appropriate  PT Frequency Min 3X/week  Recommendations for Other Services Rehab consult  Follow Up Recommendations CIR  PT equipment Rolling walker with 5" wheels  Acute Rehab PT Goals  PT Goal Formulation With patient/family  Time For Goal Achievement 06/21/12  Potential to Achieve Goals Fair  Pt will go Supine/Side to Sit with modified independence  PT Goal: Supine/Side to Sit - Progress Progressing toward goal  Pt will go Sit to Supine/Side with min assist  PT Goal: Sit to Supine/Side - Progress Progressing toward goal  Pt will go Sit to Stand with supervision  PT Goal: Sit to Stand - Progress Progressing toward goal  Pt will go Stand to Sit with supervision  PT Goal: Stand to Sit - Progress Goal set today  Pt will Stand with min assist;3 - 5 min;with unilateral upper extremity support  PT Goal: Stand - Progress Progressing toward goal  Pt will Ambulate with min assist;with least restrictive assistive device;with supervision;>150 feet  PT General Charges  $$ ACUTE PT VISIT 1 Procedure  PT Treatments  $Therapeutic Activity 8-22 mins  [name] [name] DPT [phone]

## 2012-06-18 NOTE — Progress Notes (Signed)
MS better.  Incisions CDI, ostomy not sealing well. Awaiting CIR.  I spoke to his wife as well. Appreciate Dr. Kalman Drape F/U.  Patient examined and I agree with the assessment and plan  Violeta Gelinas, MD, MPH, FACS Pager: [phone]  06/18/2012 9:54 AM

## 2012-06-18 NOTE — Progress Notes (Signed)
IP PROGRESS NOTE  Subjective:   He is tolerating a diet. He reports improvement in the abdominal pain and nausea. The ileostomy is "leaking". He is being evaluated for a rehabilitation medicines today.   Objective: Vital signs in last 24 hours: Blood pressure 113/65, pulse 86, temperature 97.5 F (36.4 C), temperature source Oral, resp. rate 18, height 6\' 1"  (1.854 m), weight 194 lb 14.2 oz (88.4 kg), SpO2 99.00%.  Intake/Output from previous day: 03/31 0701 - 04/01 0700 In: 643 [P.O.:240; I.V.:403] Out: 700 [Urine:450; Stool:250]  Physical Exam:  HEENT: Neck without mass Lungs: Decreased breath sounds at the bases, no respiratory distress Cardiac: Regular rate and rhythm Abdomen: Liquid stool in the ileostomy bag Extremities: No leg edema Lymph nodes: There is a 1/2 cm mobile left scalene node.  Lab Results:  Recent Labs  06/17/12 0535  WBC 9.2  HGB 10.2*  HCT 29.2*  PLT 363    BMET  Recent Labs  06/18/12 0618  NA 134*  K 4.3  CL 98  CO2 28  GLUCOSE 107*  BUN 13  CREATININE 0.91  CALCIUM 9.1    Medications: I have reviewed the patient's current medications.   Impression:  1. Adenocarcinoma involving biopsies of the stomach and transverse colon-likely primary gastric cancer with direct extension to the transverse colon, status post an exploratory laparoscopy 06/13/2012 with no tumor seen.  2. Partial colonic obstruction secondary to #1-status post a palliative diverting distal ileostomy 06/13/2012  3. History of atrial fibrillation-now maintained on xarelto  4. Ascites on the CT scan 05/30/2012-peritoneal fluid on 06/13/2012 was positive for metastatic adenocarcinoma  5. Left neck lymph nodes-biopsy of a left scalene node 06/13/2012 was positive for metastatic adenocarcinoma  I discussed the pathology from the left scalene node in peritoneal fluid with [name] [name] and his wife. He has metastatic gastric cancer. No therapy will be curative. I  discussed the poor prognosis associated with carcinomatosis. No "bulk "disease was seen in the abdominal cavity at the time of surgery.  He plans to undergo a physical therapy program in an attempt to improve his performance status. He had an excellent performance status prior to becoming ill with the bowel obstruction. I will continue to follow him on the rehabilitation medicine service and arrange for outpatient followup. We will consider a trial of systemic chemotherapy if his performance status improves. I will request  HER-2/neu testing on the gastric biopsy specimen.     LOS: 19 days   Warner Laduca  06/18/2012, 1:41 PM

## 2012-06-18 NOTE — Plan of Care (Signed)
Overall Plan of Care [facility]) Patient Details Name: [name] [name] MRN: [id number] DOB: 12-Apr-1930  Diagnosis:  Metastatic gastric cancer with deconditioning  Co-morbidities: ex lap, sun down syndrome, ostomy, urinary frequency  Functional Problem List  Patient demonstrates impairments in the following areas: Balance, Cognition, Edema, Endurance, Safety and Skin Integrity  Basic ADL's: grooming, bathing, dressing and toileting Advanced ADL's: simple meal preparation  Transfers:  bed mobility, bed to chair, toilet, tub/shower, car and furniture Locomotion:  ambulation, wheelchair mobility and stairs  Additional Impairments:  None  Anticipated Outcomes Item Anticipated Outcome  Eating/Swallowing  Mod I  Basic self-care  Modified independent  Tolieting  Modified independent  Bowel/Bladder  Mod I bladder (min assist with ostomy)  Transfers  Basic transfers mod I; car S  Locomotion  S gait; mod I w/c mobility  Communication    Cognition    Pain    Safety/Judgment    Other     Therapy Plan: PT Intensity: Minimum of 1-2 x/day ,45 to 90 minutes (may benefit from 15/7 - will monitor) PT Frequency: 5 out of 7 days PT Duration Estimated Length of Stay: 7-10 days OT Intensity: Minimum of 1-2 x/day, 45 to 90 minutes OT Frequency: 5 out of 7 days OT Duration/Estimated Length of Stay: 7-9 days      Team Interventions: Item RN PT OT SLP SW TR Other  Self Care/Advanced ADL Retraining x x x      Neuromuscular Re-Education  x x      Therapeutic Activities x x x      UE/LE Strength Training/ROM  x x      UE/LE Coordination Activities  x x      Visual/Perceptual Remediation/Compensation         DME/Adaptive Equipment Instruction  x x      Therapeutic Exercise  x x      Balance/Vestibular Training  x x      Patient/Family Education  x x      Cognitive Remediation/Compensation  x x      Functional Mobility Training  x x      Ambulation/Gait Training  x       Stair  Training  x       Wheelchair Propulsion/Positioning  x       Functional Tourist information centre manager Reintegration  x x      Dysphagia/Aspiration Film/video editor         Bladder Management x        Bowel Management x        Disease Management/Prevention x x x      Pain Management x x x      Medication Management x        Skin Care/Wound Management x x       Splinting/Orthotics  x       Discharge Planning x x x      Psychosocial Support x x x                             Team Discharge Planning: Destination: PT-Home ,OT- Home , SLP-  Projected Follow-up: PT-Home health PT;24 hour supervision/assistance, OT-  Home health OT;Outpatient OT, SLP-  Projected Equipment Needs: PT-Rolling walker with 5" wheels;Wheelchair (measurements);Wheelchair cushion (measurements), OT- Tub/shower seat, SLP-  Patient/family involved in discharge planning: PT- Patient;Family member/caregiver,  OT-Patient;Family  member/caregiver, SLP-   MD ELOS: 7-10 days Medical Rehab Prognosis:  Excellent Assessment:  The patient has been admitted for CIR therapies. The team will be addressing, functional mobility, strength, stamina, balance, safety, adaptive techniques/equipment, self-care, bowel and bladder mgt, patient and caregiver education, ostomy care. Goals have been set at mod I to min assist with ostomy care.Ranelle Oyster, MD, FAAPMR     See Team Conference Notes for weekly updates to the plan of care

## 2012-06-18 NOTE — Progress Notes (Signed)
Admitting patient to inpatient rehab today. I have received authorization from [facility] and Dr Isidoro Donning reports pt is ready for d/c to CIR.  For questions call [phone]

## 2012-06-18 NOTE — Telephone Encounter (Signed)
3/31 pof for hospital f/u sent by Vicie Mutters forwarded to HIM. Pt never seen @ [facility].

## 2012-06-18 NOTE — Interval H&P Note (Signed)
[name] [name] was admitted today to Inpatient Rehabilitation with the diagnosis of deconditioning due to metastatic gastric cancer and associated complications.  The patient's history has been reviewed, patient examined, and there is no change in status.  Patient continues to be appropriate for intensive inpatient rehabilitation.  I have reviewed the patient's chart and labs.  Questions were answered to the patient's satisfaction.  Roby Spalla T 06/18/2012, 9:15 PM

## 2012-06-19 ENCOUNTER — Inpatient Hospital Stay (HOSPITAL_COMMUNITY): Payer: Medicare Other

## 2012-06-19 ENCOUNTER — Inpatient Hospital Stay (HOSPITAL_COMMUNITY): Payer: Medicare Other | Admitting: Occupational Therapy

## 2012-06-19 DIAGNOSIS — G9341 Metabolic encephalopathy: Secondary | ICD-10-CM

## 2012-06-19 DIAGNOSIS — R5381 Other malaise: Secondary | ICD-10-CM

## 2012-06-19 DIAGNOSIS — C169 Malignant neoplasm of stomach, unspecified: Secondary | ICD-10-CM

## 2012-06-19 LAB — CBC WITH DIFFERENTIAL/PLATELET
Basophils Absolute: 0 K/uL (ref 0.0–0.1)
Basophils Relative: 0 % (ref 0–1)
Eosinophils Absolute: 0.1 K/uL (ref 0.0–0.7)
Eosinophils Relative: 2 % (ref 0–5)
HCT: 28.7 % — ABNORMAL LOW (ref 39.0–52.0)
Hemoglobin: 10 g/dL — ABNORMAL LOW (ref 13.0–17.0)
Lymphocytes Relative: 15 % (ref 12–46)
Lymphs Abs: 1.1 K/uL (ref 0.7–4.0)
MCH: 32.6 pg (ref 26.0–34.0)
MCHC: 34.8 g/dL (ref 30.0–36.0)
MCV: 93.5 fL (ref 78.0–100.0)
Monocytes Absolute: 0.9 K/uL (ref 0.1–1.0)
Monocytes Relative: 13 % — ABNORMAL HIGH (ref 3–12)
Neutro Abs: 5.3 K/uL (ref 1.7–7.7)
Neutrophils Relative %: 71 % (ref 43–77)
Platelets: 377 K/uL (ref 150–400)
RBC: 3.07 MIL/uL — ABNORMAL LOW (ref 4.22–5.81)
RDW: 13.7 % (ref 11.5–15.5)
WBC: 7.5 K/uL (ref 4.0–10.5)

## 2012-06-19 LAB — URINALYSIS, ROUTINE W REFLEX MICROSCOPIC
Bilirubin Urine: NEGATIVE
Ketones, ur: NEGATIVE mg/dL
Nitrite: NEGATIVE
Protein, ur: 30 mg/dL — AB
Urobilinogen, UA: 0.2 mg/dL (ref 0.0–1.0)
pH: 6 (ref 5.0–8.0)

## 2012-06-19 LAB — COMPREHENSIVE METABOLIC PANEL
Albumin: 2.2 g/dL — ABNORMAL LOW (ref 3.5–5.2)
Alkaline Phosphatase: 126 U/L — ABNORMAL HIGH (ref 39–117)
BUN: 15 mg/dL (ref 6–23)
Chloride: 95 mEq/L — ABNORMAL LOW (ref 96–112)
Creatinine, Ser: 0.87 mg/dL (ref 0.50–1.35)
GFR calc Af Amer: >90 mL/min (ref >90)
GFR calc non Af Amer: 80 mL/min — ABNORMAL LOW (ref >90)
Glucose, Bld: 107 mg/dL — ABNORMAL HIGH (ref 70–99)
Potassium: 3.9 mEq/L (ref 3.5–5.1)
Total Bilirubin: 0.4 mg/dL (ref 0.3–1.2)

## 2012-06-19 LAB — URINE MICROSCOPIC-ADD ON

## 2012-06-19 MED ORDER — TRAZODONE HCL 50 MG PO TABS
25.0000 mg | ORAL_TABLET | Freq: Every day | ORAL | Status: DC
  Administered 2012-06-19 – 2012-06-20 (×2): 25 mg via ORAL
  Filled 2012-06-19 (×2): qty 1

## 2012-06-19 MED ORDER — TROLAMINE SALICYLATE 10 % EX CREA: TOPICAL_CREAM | Freq: Two times a day (BID) | CUTANEOUS | Status: DC | PRN

## 2012-06-19 MED ORDER — MUSCLE RUB 10-15 % EX CREA
TOPICAL_CREAM | Freq: Two times a day (BID) | CUTANEOUS | Status: DC | PRN
  Administered 2012-06-19: 17:00:00 via TOPICAL
  Filled 2012-06-19: qty 85

## 2012-06-19 NOTE — Progress Notes (Signed)
Occupational Therapy Session Note  Patient Details  Name: [name] [name] MRN: [id number] Date of Birth: 1931/03/08  Today's Date: 06/19/2012 Time: 1430-1510 Time Calculation (min): 40 min  Short Term Goals: Week 1:  OT Short Term Goal 1 (Week 1): STGs equal LTGs secondary to ELOS.  Overall modified independent level expected.  Skilled Therapeutic Interventions/Progress Updates:  Patient found seated in w/c with wife in room. Patient propelled self from room -> ADL apartment for focus on functional ambulation using rolling walker, safety with use of rolling walker, furniture transfer on/off couch, sit<>stands, dynamic standing balance/tolerance/endurance, and simulated walk-in shower transfer on/off shower seat (using simulated blue square). Patient then ambulated back to room with rolling walker, focusing on less tension throughout bilateral shoulders. Patient's wife present during entire session and education provided to her regarding safety with sit<>stands, safety with rolling walker, and shower stall transfer. Recommending patient have a BSC and shower seat at d/c. Patient left seated in recliner with wife present at end of session.   Precautions:  Precautions Precautions: Fall Precaution Comments: ileostomy, abdominal incisional wound Restrictions Weight Bearing Restrictions: No  ADL: ADL Eating: Set up Where Assessed-Eating: Chair Grooming: Setup Where Assessed-Grooming: Chair;Sitting at sink Upper Body Bathing: Supervision/safety Where Assessed-Upper Body Bathing: Chair Lower Body Bathing: Minimal assistance Where Assessed-Lower Body Bathing: Shower;Standing at sink;Sitting at sink Upper Body Dressing: Supervision/safety Where Assessed-Upper Body Dressing: Chair;Sitting at sink Lower Body Dressing: Minimal assistance Where Assessed-Lower Body Dressing: Chair Toileting: Minimal assistance Where Assessed-Toileting: Teacher, adult education: Company secretary Method: Proofreader: Chiropractor Transfer: Minimal Radiation protection practitioner Method: Ship broker: Grab bars;Shower seat with back Film/video editor: Minimal TEFL teacher Method: Designer, industrial/product: Shower seat with back;Grab bars  See FIM for current functional status  Therapy/Group: Individual Therapy  Ajna Moors 06/19/2012, 4:02 PM

## 2012-06-19 NOTE — Progress Notes (Signed)
Subjective/Complaints: Had a pretty good night. Did have some incontinence last night with frequency. Minimal confusion reported A 12 point review of systems has been performed and if not noted above is otherwise negative.   Objective: Vital Signs: Blood pressure 125/68, pulse 70, temperature 97.8 F (36.6 C), temperature source Oral, resp. rate 20, height 6\' 1"  (1.854 m), weight 81.6 kg (179 lb 14.3 oz), SpO2 95.00%. Dg Chest Port 1 View  06/17/2012  *RADIOLOGY REPORT*  Clinical Data: Fever  PORTABLE CHEST - 1 VIEW  Comparison: 06/06/2012  Findings: Left lower lobe consolidation remains and may represent pneumonia.  Improvement small left effusion.  Improved aeration in the right lung base.  Improved lung volumes since the prior study.  Negative for heart failure.  IMPRESSION: Persistent left lower lobe consolidation, probable pneumonia   Original Report Authenticated By: Janeece Riggers, M.D.     Recent Labs  06/17/12 0535 06/19/12 0720  WBC 9.2 7.5  HGB 10.2* 10.0*  HCT 29.2* 28.7*  PLT 363 377    Recent Labs  06/18/12 0618  NA 134*  K 4.3  CL 98  GLUCOSE 107*  BUN 13  CREATININE 0.91  CALCIUM 9.1   CBG (last 3)   Recent Labs  06/17/12 1640  GLUCAP 109*    Wt Readings from Last 3 Encounters:  06/18/12 81.6 kg (179 lb 14.3 oz)  06/13/12 88.4 kg (194 lb 14.2 oz)  06/13/12 88.4 kg (194 lb 14.2 oz)    Physical Exam:  General: Alert and oriented x 3, appears a little fatigued. No distress.  HEENT: Head is normocephalic, atraumatic, PERRLA, EOMI, sclera anicteric, oral mucosa pink and moist, dentition intact, ext ear canals clear,  Neck: Supple without JVD or lymphadenopathy  Heart: Reg rate and rhythm. No murmurs rubs or gallops  Chest: decreased breath sounds at bases. no wheezes, rales, or rhonchi; no distress  Abdomen: Soft, non-tender, non-distended, bowel sounds positive. Tender around ostomy site, some irritation around the inferior border, appears fungal.   Extremities: No clubbing, cyanosis, or edema. Pulses are 2+  Skin: wounds clean and well approximated. Ostomy well adhered.  Neuro: Pt is cognitively appropriate with improved but still basic insight, memory, and awareness. Sometimes processing delayed. Cranial nerves 2-12 are intact. Sensory exam appears intact for pinprick and light touch. Reflexes are 2+ in all 4's. Fine motor coordination is slightly diminished. Has mild intentional right greater than left tremors. Motor function is grossly 4/5 ue prox to distally. Lower ext 2+ to 3- at HF, 3 KE, 3-4 ADF and APf  Musculoskeletal: Full ROM, No pain with PROM in the neck, trunk, or extremities. Decreased truncal control as a whole  Psych: Pt's affect is appropriate. Pt is cooperative. Conversationally fairly appropriate.  Assessment/Plan: 1. Functional deficits secondary to metastatic gastric cancer and ex lap. Post-op encephalopathy which require 3+ hours per day of interdisciplinary therapy in a comprehensive inpatient rehab setting. Physiatrist is providing close team supervision and 24 hour management of active medical problems listed below. Physiatrist and rehab team continue to assess barriers to discharge/monitor patient progress toward functional and medical goals. FIM:                   Comprehension Comprehension Mode: Auditory Comprehension: 5-Follows basic conversation/direction: With no assist  Expression Expression Mode: Verbal Expression: 5-Expresses basic needs/ideas: With extra time/assistive device  Social Interaction Social Interaction: 5-Interacts appropriately 90% of the time - Needs monitoring or encouragement for participation or interaction.     Memory  Memory: 5-Recognizes or recalls 90% of the time/requires cueing < 10% of the time  Medical Problem List and Plan:  1. DVT Prophylaxis/Anticoagulation: Pharmaceutical: Xarelto  2. Pain Management: Limit narcotics due to sedative SE. Unable to tolerate  oxycodone or morphine per reports. Will use tylenol prn and monitor for complaints.  3. Mood: seems to be appropriate. Wife and patient (to an extent aware of diagnosis) aware of diagnosis. Will have LCSW follow for support.  4. Neuropsych: This patient is generally capable of making decisions on his/her own behalf.  5. Hyponatremia: follow up labs 6. CAF: Monitor HR with bid check. Continue Cardizem and Xarelto.  7. HTN: Will monitor with bid checks.  8. 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 Dysarthria fluctuates depending on fatigue.  9. GIB/ABLA: Hgb stable on pradexa. Will recheck in am and monitor for signs of bleeding.  10. LLL PNA: on levaquin D#2/ 7-10. Afebrile, no oxygen presently 11. Stage IV metastatic AdenoCA of stomach: to follow up with Dr. Truett Perna post discharge  12. Sundowning: had a good night. Hopefully this will continue to improve. Avoid medication if possible 13. Bladder. Timed voids. Fluid rationing at night  LOS (Days) 1 A FACE TO FACE EVALUATION WAS PERFORMED  Barnard Sharps T 06/19/2012 8:17 AM

## 2012-06-19 NOTE — Progress Notes (Signed)
Physical Therapy Session Note  Patient Details  Name: [name] [name] MRN: [id number] Date of Birth: 1930/11/25  Today's Date: 06/19/2012 Time: 1530-1600 Time Calculation (min): 30 min  Short Term Goals: Week 1:  PT Short Term Goal 1 (Week 1): = LTGs  Skilled Therapeutic Interventions/Progress Updates:   Gait with RW on unit down to therapy gym with steady A; as fatigued, required a little more assist with turns. Focused on dynamic standing balance activity to reach outside BOS for horseshoes and place on basketball hoop. Then increased challenge while standing on compliant surface. Required overall min A and seated rest breaks due to fatigue. Excellent participation.   Therapy Documentation Precautions:  Precautions Precautions: Fall Precaution Comments: ileostomy, abdominal incisional wound Restrictions Weight Bearing Restrictions: No   Pain:  No complaints of pain.  See FIM for current functional status  Therapy/Group: Individual Therapy  Karolee Stamps [facility] 06/19/2012, 4:16 PM

## 2012-06-19 NOTE — Evaluation (Signed)
Occupational Therapy Assessment and Plan  Patient Details  Name: [name] [name] MRN: [id number] Date of Birth: [birth year]  OT Diagnosis: muscle weakness (generalized) Rehab Potential: Rehab Potential: Excellent ELOS: 7-9 days   Today's Date: 06/19/2012 Time: 1005-1103 Time Calculation (min): 58 min  Problem List:  Patient Active Problem List  Diagnosis  . ERECTILE DYSFUNCTION  . TOBACCO DEPENDENCE  . NEUROPATHY, IDIOPATHIC PERIPHERAL  . HYPERTENSION, BENIGN SYSTEMIC  . DIVERTICULOSIS OF COLON  . BPH  . SCIATICA, RIGHT  . METATARSALGIA  . NUMBNESS  . Left knee pain  . Knee effusion, left  . Chest pain  . CVA (cerebral infarction)  . HTN (hypertension), malignant  . TIA (transient ischemic attack)  . Atrial fibrillation  . Gastroparesis  . Atrial fibrillation, rapid  . GI bleed  . Abdominal distention  . Syncope  . SBO (small bowel obstruction)  . Diastolic CHF, acute  . Acute diastolic heart failure  . Acute respiratory distress  . Acute encephalopathy  . Dysarthria, acute  . Paroxysmal a-fib/flutter  . Hypokalemia  . Weakness generalized  . Pain, abdominal, generalized  . Neck stiffness  . Gastric adenocarcinoma  . Ileostomy in place  . Anemia of chronic disease  . Encephalopathy    Past Medical History:  Past Medical History  Diagnosis Date  . Ramsay Hunt auricular syndrome   . HTN (hypertension)   . Osteoarthritis   . Dysrhythmia 04/17/12  . Anxiety   . Stroke   . H/O Bell's palsy at age 8  . Atrial flutter   . Atrial fibrillation   . Ileostomy in place 06/14/2012   Past Surgical History:  Past Surgical History  Procedure Laterality Date  . Hernia repair    . Vasectomy    . Prostate biopsies      per Vonita Moss  . Cataract extraction      SE eye  . Colonoscopy N/A 06/03/2012    Procedure: COLONOSCOPY;  Surgeon: Florencia Reasons, MD;  Location: [facility] ENDOSCOPY;  Service: Endoscopy;  Laterality: N/A;  . Esophagogastroduodenoscopy N/A 06/03/2012     Procedure: ESOPHAGOGASTRODUODENOSCOPY (EGD);  Surgeon: Florencia Reasons, MD;  Location: [facility] ENDOSCOPY;  Service: Endoscopy;  Laterality: N/A;  . Laparoscopy N/A 06/13/2012    Procedure: EXPLORATORY LAPAROSCOPY ;  Surgeon: Liz Malady, MD;  Location: [facility] OR;  Service: General;  Laterality: N/A;  . Diverting ileostomy N/A 06/13/2012    Procedure: DIVERTING LOOP ILEOSTOMY;  Surgeon: Liz Malady, MD;  Location: [facility] OR;  Service: General;  Laterality: N/A;  . Lymph node biopsy Left 06/13/2012    Procedure: LYMPH NODE BIOPSY;  Surgeon: Liz Malady, MD;  Location: [facility] OR;  Service: General;  Laterality: Left;  [place] Lymph Node Biopsy    Assessment & Plan Clinical Impression: Patient is a 79 y.o. year old male with recent admission to the hospital on 05/30/12 with A Fib with RVR and GIB. CT suggestive of ischemic colitis and ileus with diffuse ascites. Started on IV cardizem for HR control. EGD with biopsy done positive for gastric CA with mets to colon. He did develop respiratory failure due to pulmonary edema and symptoms improved with diuresis. Dr. Darnelle Catalan consulted for input on stage 4 stomach cancer and treatment options discussed with recommendations for input from palliative care. Cytology/peritoneal fluid positive for Adeno CA--to follow up with Dr. Truett Perna past discharge. Has been evaluated by Palliative care and is Full Code. On 03/21, Neurology consulted due to delirium with visual hallucinations and as CT  negative recommended MRI of brain for workup once stable. Patient elected to undergo exp lap with diverting loop ileostomy on 06/13/12 by Dr. Janee Morn.  Patient transferred to CIR on 06/18/2012 .    Patient currently requires min with basic self-care skills secondary to muscle weakness and decreased cardiorespiratoy endurance.  Prior to hospitalization, patient could complete ADLs and IADLs with independent .  Patient will benefit from skilled intervention to increase level of  independence with iADL prior to discharge home with care partner.  Anticipate patient will require intermittent supervision but most goals are projected to be modified independent level and follow up home health.  OT - End of Session Activity Tolerance: Tolerates 10 - 20 min activity with multiple rests Endurance Deficit: Yes Endurance Deficit Description: Needed multiple rest breaks during session secondary to fatigue. OT Assessment Rehab Potential: Excellent Barriers to Discharge: None OT Plan OT Intensity: Minimum of 1-2 x/day, 45 to 90 minutes OT Frequency: 5 out of 7 days OT Duration/Estimated Length of Stay: 7-9 days OT Treatment/Interventions: Metallurgist training;Community reintegration;Discharge planning;DME/adaptive equipment instruction;Psychosocial support;Patient/family education;Pain management;Neuromuscular re-education;Functional mobility training;Self Care/advanced ADL retraining;UE/LE Coordination activities;Therapeutic Activities;Therapeutic Exercise;UE/LE Strength taining/ROM OT Recommendation Patient destination: Home Follow Up Recommendations: Home health OT;Outpatient OT Equipment Recommended: Tub/shower seat   OT Evaluation Precautions/Restrictions  Precautions Precautions: Fall Precaution Comments: ileostomy, abdominal incisional wound Restrictions Weight Bearing Restrictions: No   Pain Pain Assessment Pain Assessment: No/denies pain Home Living/Prior Functioning Home Living Lives With: Spouse Available Help at Discharge: Available 24 hours/day Type of Home: House Home Access: Stairs to enter [employer] of Steps: 2 into the garage; no rails but has something he can hold onto per wife report Entrance Stairs-Rails: None Home Layout: Two level;Able to live on main level with bedroom/bathroom Bathroom Shower/Tub: Health visitor: Standard Bathroom Accessibility: Yes How Accessible: Accessible via walker Home Adaptive  Equipment: None Prior Function Driving: Yes Vocation: Retired Comments: Past marathon runner. Worked out at [employer] and him and wife took daily walks ADL ADL Eating: Set up Where Assessed-Eating: Chair Grooming: Setup Where Assessed-Grooming: Chair;Sitting at sink Upper Body Bathing: Supervision/safety Where Assessed-Upper Body Bathing: Chair Lower Body Bathing: Minimal assistance Where Assessed-Lower Body Bathing: Shower;Standing at sink;Sitting at sink Upper Body Dressing: Supervision/safety Where Assessed-Upper Body Dressing: Chair;Sitting at sink Lower Body Dressing: Minimal assistance Where Assessed-Lower Body Dressing: Chair Toileting: Minimal assistance Where Assessed-Toileting: Teacher, adult education: Curator Method: Proofreader: Chiropractor Transfer: Minimal Radiation protection practitioner Method: Ship broker: Grab bars;Shower seat with back Film/video editor: Insurance underwriter Method: Designer, industrial/product: Information systems manager with back;Grab bars Vision/Perception  Vision - History Baseline Vision: Wears glasses all the time Patient Visual Report: No change from baseline Vision - Assessment Vision Assessment: Vision not tested Additional Comments: Noted pt having difficulty seeing hand held shower fitting to replace after use. Perception Perception: Within Functional Limits Praxis Praxis: Intact  Cognition Overall Cognitive Status: Appears within functional limits for tasks assessed Arousal/Alertness: Awake/alert Orientation Level: Oriented X4 Attention: Sustained Memory: Impaired Memory Impairment: Decreased short term memory Awareness: Appears intact Problem Solving: Appears intact Safety/Judgment: Appears intact Sensation Sensation Light Touch: Appears Intact Stereognosis: Appears Intact Hot/Cold: Appears Intact Proprioception: Appears  Intact Coordination Gross Motor Movements are Fluid and Coordinated: Yes Fine Motor Movements are Fluid and Coordinated: No Coordination and Movement Description: Pt with slight tremors in his hands but able to open his toothpaste and apply to the toothbrish with increased time. Motor  Motor Motor: Within Functional Limits Mobility  Transfers Sit to Stand: 4: Min assist;With upper extremity assist;With armrests;From chair/3-in-1 Sit to Stand Details: Manual facilitation for weight shifting Stand to Sit: 4: Min assist;With upper extremity assist;To chair/3-in-1 Stand to Sit Details (indicate cue type and reason): Manual facilitation for weight shifting  Trunk/Postural Assessment  Cervical Assessment Cervical Assessment: Within Functional Limits Thoracic Assessment Thoracic Assessment: Exceptions to [facility] Thoracic Strength Overall Thoracic Strength Comments: thoracic kyphosis present Lumbar Assessment Lumbar Assessment: Within Functional Limits Postural Control Postural Control: Within Functional Limits  Balance Balance Balance Assessed: Yes Static Sitting Balance Static Sitting - Balance Support: No upper extremity supported Static Sitting - Level of Assistance: 7: Independent Dynamic Sitting Balance Dynamic Sitting - Level of Assistance: 5: Stand by assistance Static Standing Balance Static Standing - Balance Support: No upper extremity supported Static Standing - Level of Assistance: 4: Min assist Dynamic Standing Balance Dynamic Standing - Balance Support: No upper extremity supported Dynamic Standing - Level of Assistance: 4: Min assist Extremity/Trunk Assessment RUE Assessment RUE Assessment: Exceptions to [facility] RUE Strength RUE Overall Strength Comments: Pt with AROM shoulder flexion WFLS however strength 3+/5, noted weak scapular muscles as well with winging at inferior border. LUE Assessment LUE Assessment: Exceptions to [facility] LUE Strength LUE Overall Strength  Comments: Pt with AROM shoulder flexion WFLS however strength 3+/5, noted weak scapular muscles as well with winging at inferior border.  FIM:  FIM - Eating Eating Activity: 5: Set-up assist for open containers FIM - Grooming Grooming Steps: Wash, rinse, dry face;Wash, rinse, dry hands;Oral care, brush teeth, clean dentures Grooming: 5: Supervision: safety issues or verbal cues FIM - Bathing Bathing Steps Patient Completed: Chest;Right Arm;Left Arm;Abdomen;Front perineal area;Buttocks;Right upper leg;Left upper leg Bathing: 4: Min-Patient completes 8-9 77f 10 parts or 75+ percent FIM - Upper Body Dressing/Undressing Upper body dressing/undressing steps patient completed: Thread/unthread left sleeve of pullover shirt/dress;Thread/unthread right sleeve of pullover shirt/dresss;Pull shirt over trunk;Put head through opening of pull over shirt/dress Upper body dressing/undressing: 5: Supervision: Safety issues/verbal cues FIM - Lower Body Dressing/Undressing Lower body dressing/undressing steps patient completed: Thread/unthread right pants leg;Thread/unthread left pants leg;Pull pants up/down;Don/Doff right sock;Don/Doff left sock;Don/Doff right shoe;Don/Doff left shoe;Fasten/unfasten right shoe;Fasten/unfasten left shoe Lower body dressing/undressing: 4: Steadying Assist FIM - Banker Devices: Arm rests Bed/Chair Transfer: 4: Supine > Sit: Min A (steadying Pt. > 75%/lift 1 leg);4: Bed > Chair or W/C: Min A (steadying Pt. > 75%);4: Chair or W/C > Bed: Min A (steadying Pt. > 75%) FIM - Secretary/administrator Devices: Shower chair;Grab bars Tub/shower Transfers: 4-Out of Tub/Shower: Min A (steadying Pt. > 75%/lift 1 leg);4-Into Tub/Shower: Min A (steadying Pt. > 75%/lift 1 leg)   Refer to Care Plan for Long Term Goals  Recommendations for other services: None  Discharge Criteria: Patient will be discharged from OT if patient refuses  treatment 3 consecutive times without medical reason, if treatment goals not met, if there is a change in medical status, if patient makes no progress towards goals or if patient is discharged from hospital.  The above assessment, treatment plan, treatment alternatives and goals were discussed and mutually agreed upon: by patient  Began work and education on selfcare re-training this am.  Wife present and also began discussion on possible DME needs at home as well.  Lyssa Hackley OTR/L 06/19/2012, 11:49 AM

## 2012-06-19 NOTE — Progress Notes (Addendum)
INITIAL NUTRITION ASSESSMENT  DOCUMENTATION CODES Per approved criteria  -Not Applicable   INTERVENTION: 1.  Supplements; [employer] daily. RD placed request in [employer]. 2.  Modify diet; RD discussed lactose intolerance with family.  Removed limitation for lactose in [employer] 3.  Brief education; provided.  Nutrition therapy recommendations for new ileostomy education provided to wife using Teach Back method.   4.  Please obtain new wt  NUTRITION DIAGNOSIS: Increased nutrient needs related to healing, increased energy expenditure, wt loss as evidenced by pt s/p abdominal surgery, participating in intensive rehab program, wt loss PTA reported by wife  Monitor:  1.  Food/Beverage; intake with meal and supplements sufficient to meet >/=90% estimated needs 2.  Wt/wt change; monitor trend, deter loss 3.  Knowledge; monitor for questions related to nutrition therapy/diet.  Reason for Assessment: consult; new ileostomy edu  79 y.o. male  Admitting Dx: Encephalopathy  ASSESSMENT: Pt admitted to CIR from [facility] s/p ex lap with ileostomy for metastatic gastric cancer.  Pt known to clinical nutrition staff during inpatient admission.  Pt with difficulty eating and slow diet advancement, eventually able to achieve Regular diet with improved intake at 50-75% of meals.  Pt was taking Ensure Complete BID with encouragement from family.  RD last met with pt (3/31) to discuss diet and progress.  Encouraged high kcal foods and increased intake as able to meet increased needs for healing and therapy participation. Consult received for ileostomy education. RD met with wife in room to discuss nutrition needs and recommended foods.  Wife engaged in conversation and has appropriate questions of her own which are answered.  Handout left for pt/family "Colostomy, Ileostomy, and Rectal Pouch Diet."  Reviewed areas of concern with wife using teach back method.  Wife reports bringing foods  from home to supplement intake. Reviewed supplement options with wife who reports pt has tried and liked [employer] in the past. RD to order.   Wife reports unknown amount of wt loss PTA.  Pt with stable wt as inpatient.  Note current weight of 179 lbs.  Recommend re-weigh (with standing scale if able) for accurate assessment of nutrition status.   Height: Ht Readings from Last 1 Encounters:  06/18/12 6\' 1"  (1.854 m)    Weight: Wt Readings from Last 1 Encounters:  06/18/12 179 lb 14.3 oz (81.6 kg)    Ideal Body Weight: 83.6 kg  % Ideal Body Weight: 102%  Wt Readings from Last 10 Encounters:  06/18/12 179 lb 14.3 oz (81.6 kg)  06/13/12 194 lb 14.2 oz (88.4 kg)  06/13/12 194 lb 14.2 oz (88.4 kg)  06/13/12 194 lb 14.2 oz (88.4 kg)  05/07/12 187 lb 12.8 oz (85.186 kg)  04/17/12 185 lb (83.915 kg)  11/08/11 201 lb (91.173 kg)  04/06/11 212 lb (96.163 kg)  03/12/11 205 lb (92.987 kg)  02/03/11 211 lb (95.709 kg)    Usual Body Weight: 210 lbs  % Usual Body Weight: 85%  BMI:  Body mass index is 23.74 kg/(m^2).  Estimated Nutritional Needs: Kcal: 2050-2280 Protein: 105-120g Fluid: >2 L/day  Skin: incision, otherwise intact  Diet Order: General  EDUCATION NEEDS: -Education needs addressed with family   Intake/Output Summary (Last 24 hours) at 06/19/12 1141 Last data filed at 06/19/12 1118  Gross per 24 hour  Intake    120 ml  Output   1650 ml  Net  -1530 ml    Last BM: 4/2  Labs:   Recent Labs Lab 06/18/12 0618  06/19/12 0720  NA 134* 131*  K 4.3 3.9  CL 98 95*  CO2 28 28  BUN 13 15  CREATININE 0.91 0.87  CALCIUM 9.1 9.5  GLUCOSE 107* 107*    CBG (last 3)   Recent Labs  06/17/12 1640  GLUCAP 109*    Scheduled Meds: . diltiazem  120 mg Oral Daily  . feeding supplement  237 mL Oral TID WC  . ferrous sulfate  325 mg Oral BID WC  . Gerhardt's butt cream   Topical BID  . levofloxacin  750 mg Oral Daily  . nystatin cream    Topical BID  . pantoprazole  40 mg Oral BID AC  . rivaroxaban  20 mg Oral Q supper  . vitamin C  500 mg Oral BID    Continuous Infusions:   Past Medical History  Diagnosis Date  . Ramsay Hunt auricular syndrome   . HTN (hypertension)   . Osteoarthritis   . Dysrhythmia 04/17/12  . Anxiety   . Stroke   . H/O Bell's palsy at age 18  . Atrial flutter   . Atrial fibrillation   . Ileostomy in place 06/14/2012    Past Surgical History  Procedure Laterality Date  . Hernia repair    . Vasectomy    . Prostate biopsies      per Vonita Moss  . Cataract extraction      SE eye  . Colonoscopy N/A 06/03/2012    Procedure: COLONOSCOPY;  Surgeon: Florencia Reasons, MD;  Location: [facility] ENDOSCOPY;  Service: Endoscopy;  Laterality: N/A;  . Esophagogastroduodenoscopy N/A 06/03/2012    Procedure: ESOPHAGOGASTRODUODENOSCOPY (EGD);  Surgeon: Florencia Reasons, MD;  Location: [facility] ENDOSCOPY;  Service: Endoscopy;  Laterality: N/A;  . Laparoscopy N/A 06/13/2012    Procedure: EXPLORATORY LAPAROSCOPY ;  Surgeon: Liz Malady, MD;  Location: [facility] OR;  Service: General;  Laterality: N/A;  . Diverting ileostomy N/A 06/13/2012    Procedure: DIVERTING LOOP ILEOSTOMY;  Surgeon: Liz Malady, MD;  Location: [facility] OR;  Service: General;  Laterality: N/A;  . Lymph node biopsy Left 06/13/2012    Procedure: LYMPH NODE BIOPSY;  Surgeon: Liz Malady, MD;  Location: [facility] OR;  Service: General;  Laterality: Left;  [place] Lymph Node Biopsy    Loyce Dys, MS RD LDN Clinical Inpatient Dietitian Pager: [phone] Weekend/After hours pager: [phone]

## 2012-06-19 NOTE — Progress Notes (Signed)
Patient information reviewed and entered into eRehab system by Arletha Marschke, RN, CRRN, PPS Coordinator.  Information including medical coding and functional independence measure will be reviewed and updated through discharge.     Per nursing patient was given "Data Collection Information Summary for Patients in Inpatient Rehabilitation Facilities with attached "Privacy Act Statement-Health Care Records" upon admission.  

## 2012-06-19 NOTE — Evaluation (Signed)
Physical Therapy Assessment and Plan  Patient Details  Name: [name] [name] MRN: [id number] Date of Birth: [birth year]  PT Diagnosis: Abnormal posture, Difficulty walking, Edema and Muscle weakness, Pain Rehab Potential: Good ELOS: 7-10 days   Today's Date: 06/19/2012 Time: 0825-0925 Time Calculation (min): 60 min  Problem List:  Patient Active Problem List  Diagnosis  . ERECTILE DYSFUNCTION  . TOBACCO DEPENDENCE  . NEUROPATHY, IDIOPATHIC PERIPHERAL  . HYPERTENSION, BENIGN SYSTEMIC  . DIVERTICULOSIS OF COLON  . BPH  . SCIATICA, RIGHT  . METATARSALGIA  . NUMBNESS  . Left knee pain  . Knee effusion, left  . Chest pain  . CVA (cerebral infarction)  . HTN (hypertension), malignant  . TIA (transient ischemic attack)  . Atrial fibrillation  . Gastroparesis  . Atrial fibrillation, rapid  . GI bleed  . Abdominal distention  . Syncope  . SBO (small bowel obstruction)  . Diastolic CHF, acute  . Acute diastolic heart failure  . Acute respiratory distress  . Acute encephalopathy  . Dysarthria, acute  . Paroxysmal a-fib/flutter  . Hypokalemia  . Weakness generalized  . Pain, abdominal, generalized  . Neck stiffness  . Gastric adenocarcinoma  . Ileostomy in place  . Anemia of chronic disease  . Encephalopathy    Past Medical History:  Past Medical History  Diagnosis Date  . Ramsay Hunt auricular syndrome   . HTN (hypertension)   . Osteoarthritis   . Dysrhythmia 04/17/12  . Anxiety   . Stroke   . H/O Bell's palsy at age 51  . Atrial flutter   . Atrial fibrillation   . Ileostomy in place 06/14/2012   Past Surgical History:  Past Surgical History  Procedure Laterality Date  . Hernia repair    . Vasectomy    . Prostate biopsies      per Vonita Moss  . Cataract extraction      SE eye  . Colonoscopy N/A 06/03/2012    Procedure: COLONOSCOPY;  Surgeon: Florencia Reasons, MD;  Location: [facility] ENDOSCOPY;  Service: Endoscopy;  Laterality: N/A;  .  Esophagogastroduodenoscopy N/A 06/03/2012    Procedure: ESOPHAGOGASTRODUODENOSCOPY (EGD);  Surgeon: Florencia Reasons, MD;  Location: [facility] ENDOSCOPY;  Service: Endoscopy;  Laterality: N/A;  . Laparoscopy N/A 06/13/2012    Procedure: EXPLORATORY LAPAROSCOPY ;  Surgeon: Liz Malady, MD;  Location: [facility] OR;  Service: General;  Laterality: N/A;  . Diverting ileostomy N/A 06/13/2012    Procedure: DIVERTING LOOP ILEOSTOMY;  Surgeon: Liz Malady, MD;  Location: [facility] OR;  Service: General;  Laterality: N/A;  . Lymph node biopsy Left 06/13/2012    Procedure: LYMPH NODE BIOPSY;  Surgeon: Liz Malady, MD;  Location: [facility] OR;  Service: General;  Laterality: Left;  [place] Lymph Node Biopsy    Assessment & Plan Clinical Impression: Patient is a 79 y.o. year old male with h/o CVA 12/12 and recently 1/14, CAF on pradaxa, h/o of gastritis who was admitted on 05/30/12 with A Fib with RVR and GIB. CT suggestive of ischemic colitis and ileus with diffuse ascites. Started on IV cardizem for HR control. EGD with biopsy done positive for gastric CA with mets to colon. He did develop respiratory failure due to pulmonary edema and symptoms improved with diuresis. Dr. Darnelle Catalan consulted for input on stage 4 stomach cancer and treatment options discussed with recommendations for input from palliative care. Cytology/peritoneal fluid positive for Adeno CA--to follow up with Dr. Truett Perna past discharge. Has been evaluated by Palliative care and is Full  Code.   On 03/21, Neurology consulted due to delirium with visual hallucinations and as CT negative recommended MRI of brain for workup once stable. Patient elected to undergo exp lap with diverting loop ileostomy on 06/13/12 by Dr. Janee Morn. Post op Xarelto initiated. Mentation continues to fluctuate with confusion at night. Tolerating po's and atrial fibrillation controlled on po Cardizem. Endurance slowly improving. Lethargic with hypoxia and difficult to arouse yesterday due to  severe fatigue. CXR with persistent LLL consolidation with question of PNA And he was started on Levaquin today. Therapy team recommending CIR for progression. Patient transferred to CIR on 06/18/2012 .   Patient currently requires min with mobility secondary to muscle weakness, decreased cardiorespiratoy endurance, decreased memory and decreased standing balance and decreased balance strategies.  Prior to hospitalization, patient was independent  with mobility and lived with Spouse in a House home.  Home access is 2 into the garage; no rails but has something he can hold onto per wife report. Recommended rail for increased ease of accessibility.  Patient will benefit from skilled PT intervention to maximize safe functional mobility, minimize fall risk and decrease caregiver burden for planned discharge home with 24 hour supervision.  Anticipate patient will benefit from follow up HH at discharge.  PT - End of Session Activity Tolerance: Decreased this session Endurance Deficit: Yes Endurance Deficit Description: fatigues easily with minimal exertion PT Assessment Rehab Potential: Good Barriers to Discharge:  (recommended rails for garage entry) PT Plan PT Intensity: Minimum of 1-2 x/day ,45 to 90 minutes (may benefit from 15/7 - will monitor) PT Frequency: 5 out of 7 days PT Duration Estimated Length of Stay: 7-10 days PT Treatment/Interventions: Ambulation/gait training;Balance/vestibular training;Cognitive remediation/compensation;Community reintegration;Discharge planning;Disease management/prevention;DME/adaptive equipment instruction;Functional mobility training;Neuromuscular re-education;Pain management;Patient/family education;Psychosocial support;Skin care/wound management;Splinting/orthotics;Stair training;Therapeutic Activities;Therapeutic Exercise;UE/LE Strength taining/ROM;UE/LE Coordination activities;Wheelchair propulsion/positioning PT Recommendation Follow Up Recommendations: Home  health PT;24 hour supervision/assistance Patient destination: Home Equipment Recommended: Rolling walker with 5" wheels;Wheelchair (measurements);Wheelchair cushion (measurements)  Skilled Therapeutic Intervention Individual treatment initiated with focus on transfer training (cues for hand placement and technique), gait with RW for endurance and functional mobility training, stair training for home entry, and education on energy conservation techniques/modifications to routine. Pt is motivated and works hard - frequent rest breaks due to fatigue. Family also very supportive and appears to have some insight into modifications that may be needed in future due to prognosis. Some memory deficits noted with history intake.  PT Evaluation Precautions/Restrictions Precautions Precautions: Fall Precaution Comments: ileostomy, abdominal incisional wound Pain  Reports stiff neck. Home Living/Prior Functioning Home Living Lives With: Spouse Available Help at Discharge: Available 24 hours/day Type of Home: House Home Access: Stairs to enter [employer] of Steps: 2 into the garage; no rails but has something he can hold onto per wife report Entrance Stairs-Rails: None Home Layout: Two level;Able to live on main level with bedroom/bathroom Bathroom Accessibility: Yes How Accessible: Accessible via walker Home Adaptive Equipment: None Prior Function Comments: Past marathon runner. Worked out at [employer] and him and wife took daily walks Vision/Perception  Clinical biochemist: Within Systems developer Praxis Praxis: Intact  Cognition Overall Cognitive Status: Appears within functional limits for tasks assessed Memory: Impaired Memory Impairment: Decreased short term memory (difficult with recall of history intake; wife able to correc) Safety/Judgment: Appears intact Sensation Sensation Light Touch: Appears Intact Proprioception: Appears Intact Coordination Gross Motor Movements  are Fluid and Coordinated: Yes Motor  Motor Motor: Within Functional Limits    Locomotion  Ambulation Ambulation/Gait Assistance: 4:  Min assist  Trunk/Postural Assessment  Cervical Assessment Cervical Assessment: Within Functional Limits Thoracic Assessment Thoracic Assessment: Exceptions to [facility] (slightly kyphotic posture) Lumbar Assessment Lumbar Assessment: Within Functional Limits  Balance Balance Balance Assessed: Yes Static Sitting Balance Static Sitting - Level of Assistance: 6: Modified independent (Device/Increase time) Dynamic Sitting Balance Dynamic Sitting - Level of Assistance: 5: Stand by assistance Static Standing Balance Static Standing - Level of Assistance: 4: Min assist Dynamic Standing Balance Dynamic Standing - Level of Assistance: 4: Min assist Extremity Assessment  RLE Assessment RLE Assessment: Exceptions to [facility] (decreased muscular endurance; grossly 4/5); edema in ankles LLE Assessment LLE Assessment: Exceptions to [facility] (decreased muscular endurance; grossly 4/5); edema in ankles  FIM:  FIM - Banker Devices: Arm rests Bed/Chair Transfer: 4: Supine > Sit: Min A (steadying Pt. > 75%/lift 1 leg);4: Bed > Chair or W/C: Min A (steadying Pt. > 75%);4: Chair or W/C > Bed: Min A (steadying Pt. > 75%) FIM - Locomotion: Wheelchair Locomotion: Wheelchair: 1: Total Assistance/staff pushes wheelchair (Pt<25%) FIM - Locomotion: Ambulation Locomotion: Ambulation Assistive Devices: Designer, industrial/product Ambulation/Gait Assistance: 4: Min assist Locomotion: Ambulation: 2: Travels 50 - 149 ft with minimal assistance (Pt.>75%) FIM - Locomotion: Stairs Locomotion: Building control surveyor: Hand rail - 1 Locomotion: Stairs: 1: Up and Down < 4 stairs with minimal assistance (Pt.>75%)   Refer to Care Plan for Long Term Goals  Recommendations for other services: None  Discharge Criteria: Patient will be discharged from PT if patient  refuses treatment 3 consecutive times without medical reason, if treatment goals not met, if there is a change in medical status, if patient makes no progress towards goals or if patient is discharged from hospital.  The above assessment, treatment plan, treatment alternatives and goals were discussed and mutually agreed upon: by patient and by family  Tedd Sias 06/19/2012, 9:37 AM

## 2012-06-19 NOTE — Consult Note (Signed)
WOC ostomy follow up  Pouching system from yesterday seems to working better, I will plan another pouch change with wife later in the week.   Output liquid green Ostomy pouching: 2pc. With barrier ring over support rod and around proximal and distal edges of stoma. Education provided: answered questions from wife on diet and hydration today. MASD inner gluteal folds reported per pt to be "no worse".  I will assess at my next visit with the patient.Continue barrier ointment.  WOC will follow for continued ostomy education and support.  Jahsiah Carpenter [place] RN,CWOCN [phone]

## 2012-06-20 ENCOUNTER — Inpatient Hospital Stay (HOSPITAL_COMMUNITY): Payer: Medicare Other

## 2012-06-20 ENCOUNTER — Inpatient Hospital Stay (HOSPITAL_COMMUNITY): Payer: Medicare Other | Admitting: Occupational Therapy

## 2012-06-20 MED ORDER — TAMSULOSIN HCL 0.4 MG PO CAPS
0.4000 mg | ORAL_CAPSULE | Freq: Every day | ORAL | Status: DC
  Administered 2012-06-20 – 2012-06-24 (×5): 0.4 mg via ORAL
  Filled 2012-06-20 (×6): qty 1

## 2012-06-20 NOTE — Progress Notes (Signed)
Physical Therapy Session Note  Patient Details  Name: [name] [name] MRN: [id number] Date of Birth: Feb 10, 1931  Today's Date: 06/20/2012 Time: 1478-2956 Time Calculation (min): 41 min  Short Term Goals: Week 1:  PT Short Term Goal 1 (Week 1): = LTGs  Skilled Therapeutic Interventions/Progress Updates:    Gait training with RW for endurance, safety, and generalized strengthening to/from therapy with close S/intermittent steady A. Nustep for endurance and strengthening x 10 min on level 4 with rated Borg level of 13. Gait without AD to challenge balance with overall min A. Education and discussion with pt in regards to d/c planning including thinking ahead to home modifications, fatigue level depending of type of treatment selected for cancer, and importance of continuing mobility/exercise despite treatment for cancer at an appropriate level. Pt appears to have a good and realistic outlook on situation.  Therapy Documentation Precautions:  Precautions Precautions: Fall Precaution Comments: ileostomy, abdominal incisional wound Restrictions Weight Bearing Restrictions: No   Pain:  No complaints.   See FIM for current functional status  Therapy/Group: Individual Therapy  Karolee Stamps [facility] 06/20/2012, 3:38 PM

## 2012-06-20 NOTE — Progress Notes (Signed)
Physical Therapy Session Note  Patient Details  Name: [name] [name] MRN: [id number] Date of Birth: 09-Jul-1930  Today's Date: 06/20/2012 Time: 0800-0858 Time Calculation (min): 58 min  Short Term Goals: Week 1:  PT Short Term Goal 1 (Week 1): = LTGs  Skilled Therapeutic Interventions/Progress Updates:    Gait on unit for endurance and strengthening with steady A with RW; cues for posture down to the therapy gym. Dynamic gait for household environment simulation through obstacles, stepping over threshold and stair negotiation with overall min A; cues for safety with RW. Simulated car transfer using RW with S overall; cues for safe technique. Both pt and wife verbalized and demonstrated understanding. Bed mobility and transfers in ADL apartment for home set up - determined getting out of bed on R side would be most beneficial for pt due to weakness in L UE. W/c propulsion with UEs for endurance and strengthening while holding LE up for quad activation back to pt room with S.  Therapy Documentation Precautions:  Precautions Precautions: Fall Precaution Comments: ileostomy, abdominal incisional wound Restrictions Weight Bearing Restrictions: No  Pain:  Denies pain.  See FIM for current functional status  Therapy/Group: Individual Therapy  Karolee Stamps [facility] 06/20/2012, 8:59 AM

## 2012-06-20 NOTE — Progress Notes (Signed)
Subjective/Complaints: Still with significant frequency at night. Had some of this at baseline A 12 point review of systems has been performed and if not noted above is otherwise negative.   Objective: Vital Signs: Blood pressure 129/77, pulse 67, temperature 97.4 F (36.3 C), temperature source Oral, resp. rate 16, height 6\' 1"  (1.854 m), weight 81.6 kg (179 lb 14.3 oz), SpO2 97.00%. No results found.  Recent Labs  06/19/12 0720  WBC 7.5  HGB 10.0*  HCT 28.7*  PLT 377    Recent Labs  06/18/12 0618 06/19/12 0720  NA 134* 131*  K 4.3 3.9  CL 98 95*  GLUCOSE 107* 107*  BUN 13 15  CREATININE 0.91 0.87  CALCIUM 9.1 9.5   CBG (last 3)   Recent Labs  06/17/12 1640  GLUCAP 109*    Wt Readings from Last 3 Encounters:  06/18/12 81.6 kg (179 lb 14.3 oz)  06/13/12 88.4 kg (194 lb 14.2 oz)  06/13/12 88.4 kg (194 lb 14.2 oz)    Physical Exam:  General: Alert and oriented x 3, appears a little fatigued. No distress.  HEENT: Head is normocephalic, atraumatic, PERRLA, EOMI, sclera anicteric, oral mucosa pink and moist, dentition intact, ext ear canals clear,  Neck: Supple without JVD or lymphadenopathy  Heart: Reg rate and rhythm. No murmurs rubs or gallops  Chest: decreased breath sounds at bases. no wheezes, rales, or rhonchi; no distress  Abdomen: Soft, non-tender, non-distended, bowel sounds positive. Tender around ostomy site, some irritation around the inferior border, appears fungal.  Extremities: No clubbing, cyanosis, or edema. Pulses are 2+  Skin: wounds clean and well approximated. Ostomy well adhered.  Neuro: Pt is cognitively appropriate with improved but still basic insight, memory, and awareness. Sometimes processing delayed. Cranial nerves 2-12 are intact. Sensory exam appears intact for pinprick and light touch. Reflexes are 2+ in all 4's. Fine motor coordination is slightly diminished. Has mild intentional right greater than left tremors. Motor function is  grossly 4/5 ue prox to distally. Lower ext 2+ to 3- at HF, 3 KE, 3-4 ADF and APF Musculoskeletal: Full ROM, No pain with PROM in the neck, trunk, or extremities. Decreased truncal control as a whole  Psych: Pt's affect is appropriate. Pt is cooperative. Conversationally fairly appropriate.  Assessment/Plan: 1. Functional deficits secondary to metastatic gastric cancer and ex lap. Post-op encephalopathy which require 3+ hours per day of interdisciplinary therapy in a comprehensive inpatient rehab setting. Physiatrist is providing close team supervision and 24 hour management of active medical problems listed below. Physiatrist and rehab team continue to assess barriers to discharge/monitor patient progress toward functional and medical goals. FIM: FIM - Bathing Bathing Steps Patient Completed: Chest;Right Arm;Left Arm;Abdomen;Front perineal area;Buttocks;Right upper leg;Left upper leg Bathing: 4: Min-Patient completes 8-9 37f 10 parts or 75+ percent  FIM - Upper Body Dressing/Undressing Upper body dressing/undressing steps patient completed: Thread/unthread left sleeve of pullover shirt/dress;Thread/unthread right sleeve of pullover shirt/dresss;Pull shirt over trunk;Put head through opening of pull over shirt/dress Upper body dressing/undressing: 5: Supervision: Safety issues/verbal cues FIM - Lower Body Dressing/Undressing Lower body dressing/undressing steps patient completed: Thread/unthread right pants leg;Thread/unthread left pants leg;Pull pants up/down;Don/Doff right sock;Don/Doff left sock;Don/Doff right shoe;Don/Doff left shoe;Fasten/unfasten right shoe;Fasten/unfasten left shoe Lower body dressing/undressing: 4: Steadying Assist        FIM - Banker Devices: Arm rests Bed/Chair Transfer: 4: Supine > Sit: Min A (steadying Pt. > 75%/lift 1 leg);4: Bed > Chair or W/C: Min A (steadying Pt. >  75%);4: Chair or W/C > Bed: Min A (steadying Pt. >  75%)  FIM - Locomotion: Wheelchair Locomotion: Wheelchair: 5: Travels 150 ft or more: maneuvers on rugs and over door sills with supervision, cueing or coaxing FIM - Locomotion: Ambulation Locomotion: Ambulation Assistive Devices: Designer, industrial/product Ambulation/Gait Assistance: 4: Min assist Locomotion: Ambulation: 2: Travels 50 - 149 ft with minimal assistance (Pt.>75%)  Comprehension Comprehension Mode: Auditory Comprehension: 5-Understands basic 90% of the time/requires cueing < 10% of the time  Expression Expression Mode: Verbal Expression: 5-Expresses basic needs/ideas: With extra time/assistive device  Social Interaction Social Interaction: 5-Interacts appropriately 90% of the time - Needs monitoring or encouragement for participation or interaction.  Problem Solving Problem Solving: 5-Solves basic 90% of the time/requires cueing < 10% of the time  Memory Memory: 5-Recognizes or recalls 90% of the time/requires cueing < 10% of the time  Medical Problem List and Plan:  1. DVT Prophylaxis/Anticoagulation: Pharmaceutical: Xarelto  2. Pain Management: Limit narcotics due to sedative SE. Unable to tolerate oxycodone or morphine per reports. Will use tylenol prn and monitor for complaints.  3. Mood: seems to be appropriate. Wife and patient (to an extent aware of diagnosis) aware of diagnosis. Will have LCSW follow for support.  4. Neuropsych: This patient is generally capable of making decisions on his/her own behalf.  5. Hyponatremia: follow up labs 6. CAF: Monitor HR with bid check. Continue Cardizem and Xarelto.  7. HTN: Will monitor with bid checks.  8. 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 Dysarthria fluctuates depending on fatigue.  9. GIB/ABLA: Hgb stable on pradaxa. Will recheck in am and monitor for signs of bleeding.  10. LLL PNA: on levaquin D#3/ 7-10. Afebrile, no oxygen presently--moving air better 11. Stage IV metastatic AdenoCA of stomach: to follow up with Dr. Truett Perna  post discharge  12. Sundowning: continuing to improve 13. Bladder. Timed voids. Fluid rationing at night  -ua neg, culture pending  -need pvr's. Consider medicating based on volumes  LOS (Days) 2 A FACE TO FACE EVALUATION WAS PERFORMED  SWARTZ,ZACHARY T 06/20/2012 7:59 AM

## 2012-06-20 NOTE — Progress Notes (Signed)
Occupational Therapy Session Notes  Patient Details  Name: [name] [name] MRN: [id number] Date of Birth: 12/12/1930  Today's Date: 06/20/2012  Short Term Goals: Week 1:  OT Short Term Goal 1 (Week 1): STGs equal LTGs secondary to ELOS.  Overall modified independent level expected.  Skilled Therapeutic Interventions/Progress Updates:   Session #1 4098-1191 - 55 Minutes Individual Therapy No complaints of pain Patient found seated in recliner. Patient engaged in functional ambulation/mobility from recliner -> gather necessary items then to bathroom for shower stall transfer. Patient then engaged in ADL retraining in sit<>stand position using grab bars. Patient ambulated out of bathroom for UB/LB dressing in sit<>stand position from recliner. Patient then stood at sink for grooming task of brushing teeth. Therapist donned TEDs -> bilateral LEs and encouraged patient to keep BLEs elevated while seated. Left patient seated in recliner with call bell & phone within reach.   Session #2 1400-1430 - 30 Minutes Individual Therapy No complaints of pain Patient found seated in recliner. Patient ambulated from recliner -> family room for furniture transfer on/off couch, then ambulated -> therapy gym. Once in therapy gym focused on education regarding UE strengthening using weighted dumbbells. Patient ambulated back to room and therapist left patient seated in recliner with call bell & phone within reach.   Precautions:  Precautions Precautions: Fall Precaution Comments: ileostomy, abdominal incisional wound Restrictions Weight Bearing Restrictions: No  See FIM for current functional status  [name] 06/20/2012, 7:31 AM

## 2012-06-21 ENCOUNTER — Inpatient Hospital Stay (HOSPITAL_COMMUNITY): Payer: Medicare Other | Admitting: *Deleted

## 2012-06-21 ENCOUNTER — Inpatient Hospital Stay (HOSPITAL_COMMUNITY): Payer: Medicare Other

## 2012-06-21 DIAGNOSIS — R5381 Other malaise: Secondary | ICD-10-CM

## 2012-06-21 DIAGNOSIS — G9341 Metabolic encephalopathy: Secondary | ICD-10-CM

## 2012-06-21 DIAGNOSIS — C169 Malignant neoplasm of stomach, unspecified: Secondary | ICD-10-CM

## 2012-06-21 LAB — URINE CULTURE: Colony Count: >=100000

## 2012-06-21 NOTE — Progress Notes (Signed)
Physical Therapy Session Note  Patient Details  Name: TYSHEEM ACCARDO MRN: [id number] Date of Birth: Dec 25, 1930  Today's Date: 06/21/2012 Time:  830-930 (60 min)    Short Term Goals: Week 1:  PT Short Term Goal 1 (Week 1): = LTGs  Skilled Therapeutic Interventions/Progress Updates:    Pt reports having vivid and intense dreams last night. Bed mobility mod I using rail and donned shoes sitting EOB independently. Gait with RW down to therapy gym with close S; increased cadence noted and improved posture. Stair training for home entry with 1 rail ascending on R with steady A 2 steps x 2 reps. Balance activity for alternating toe taps on step x 10 reps each bilaterally x 2 sets with seated rest break - easily fatigued. Initially required heavy min A and progressed to light min A with repetition. LE therex for functional strengthening using 3# ankle weights including LAQ, seated marches, standing hip abduction, and hamstring curls. Propelled w/c back to room with S and left up in recliner with safety belt in tact.  Therapy Documentation  Precautions:  Precautions Precautions: Fall Precaution Comments: ileostomy, abdominal incisional wound Restrictions Weight Bearing Restrictions: No General:   Pain:  Denies pain  See FIM for current functional status  Therapy/Group: Individual Therapy  Karolee Stamps [facility] 06/21/2012, 8:24 AM

## 2012-06-21 NOTE — Progress Notes (Signed)
[facility] Individual Statement of Services  Patient Name:  [name] [name]  Date:  06/21/2012  Welcome to the [facility].  Our goal is to provide you with an individualized program based on your diagnosis and situation, designed to meet your specific needs.  With this comprehensive rehabilitation program, you will be expected to participate in at least 3 hours of rehabilitation therapies Monday-Friday, with modified therapy programming on the weekends.  Your rehabilitation program will include the following services:  Physical Therapy (PT), Occupational Therapy (OT), Speech Therapy (ST), 24 hour per day rehabilitation nursing, Therapeutic Recreaction (TR), Case Management (Social Worker), Rehabilitation Medicine, Nutrition Services and Pharmacy Services  Weekly team conferences will be held on Tuesdays to discuss your progress.  Your Social Worker will talk with you frequently to get your input and to update you on team discussions.  Team conferences with you and your family in attendance may also be held.  Expected length of stay: 7-10 days  Overall anticipated outcome: supervision to modified                                                                                                                         independent  Depending on your progress and recovery, your program may change. Your Social Worker will coordinate services and will keep you informed of any changes. Your Social Worker's name and contact numbers are listed  below.  The following services may also be recommended but are not provided by the [facility]:   Driving Evaluations  Home Health Rehabiltiation Services  Outpatient Rehabilitatation [facility]  Vocational Rehabilitation   Arrangements will be made to provide these services after discharge if needed.  Arrangements include referral to agencies that provide these services.  Your insurance has been  verified to be:  [employer] Your primary doctor is:  Dr. Miguel Aschoff  Pertinent information will be shared with your doctor and your insurance company.  Social Worker:  [place], [place] [phone] or ([phone]  Information discussed with and copy given to patient by: Amada Jupiter, 06/21/2012, 4:37 PM

## 2012-06-21 NOTE — Consult Note (Signed)
WOC ostomy follow up Stoma type/location: RLQ, loop ileostomy Stomal assessment/size: 1 3/4" slightly vertical oval shaped Peristomal assessment: intact with support rod in place Treatment options for stomal/peristomal skin: using 2" barrier ring cut in half to cover and mold over support rod for seal until it can be removed Output: liquid green Ostomy pouching:2pc. 2 3/4" used today this has been working well for patient Education provided:  Pt with sense of feeling burped pouch with minimal cuing today, taught to use belt tab on pouch to grab hold of it to separate from the wafer.  He needs to be encourage by the wife and staff to continue to do this. Pt removed most of the wafer today, however I did assist the pt with removing the wafer around the support rod to make sure we did not pull on it since it is sutured to the skin.   Wife cut new wafer and she assisted with placement of the new ostomy barrier rings over support rod and then we place 1/2 of the barrier ring distal and proximal along the edge of the stoma. Wife placed new wafer on pt with me just lifting the patients abdomen slightly.  Wife placed new pouch onto the wafer and closed lock and roll closure independently.    WOC will follow along with you for continued support for ostomy education. Baani Bober Ruch RN,CWOCN [phone]

## 2012-06-21 NOTE — Progress Notes (Signed)
Social Work   Social Work Assessment and Plan  Patient Details  Name: [name] [name] MRN: [id number] Date of Birth: 11/05/1930  Today's Date: 06/21/2012  Problem List:  Patient Active Problem List  Diagnosis  . ERECTILE DYSFUNCTION  . TOBACCO DEPENDENCE  . NEUROPATHY, IDIOPATHIC PERIPHERAL  . HYPERTENSION, BENIGN SYSTEMIC  . DIVERTICULOSIS OF COLON  . BPH  . SCIATICA, RIGHT  . METATARSALGIA  . NUMBNESS  . Left knee pain  . Knee effusion, left  . Chest pain  . CVA (cerebral infarction)  . HTN (hypertension), malignant  . TIA (transient ischemic attack)  . Atrial fibrillation  . Gastroparesis  . Atrial fibrillation, rapid  . GI bleed  . Abdominal distention  . Syncope  . SBO (small bowel obstruction)  . Diastolic CHF, acute  . Acute diastolic heart failure  . Acute respiratory distress  . Acute encephalopathy  . Dysarthria, acute  . Paroxysmal a-fib/flutter  . Hypokalemia  . Weakness generalized  . Pain, abdominal, generalized  . Neck stiffness  . Gastric adenocarcinoma  . Ileostomy in place  . Anemia of chronic disease  . Encephalopathy   Past Medical History:  Past Medical History  Diagnosis Date  . Ramsay Hunt auricular syndrome   . HTN (hypertension)   . Osteoarthritis   . Dysrhythmia 04/17/12  . Anxiety   . Stroke   . H/O Bell's palsy at age 37  . Atrial flutter   . Atrial fibrillation   . Ileostomy in place 06/14/2012   Past Surgical History:  Past Surgical History  Procedure Laterality Date  . Hernia repair    . Vasectomy    . Prostate biopsies      per Vonita Moss  . Cataract extraction      SE eye  . Colonoscopy N/A 06/03/2012    Procedure: COLONOSCOPY;  Surgeon: Florencia Reasons, MD;  Location: [facility] ENDOSCOPY;  Service: Endoscopy;  Laterality: N/A;  . Esophagogastroduodenoscopy N/A 06/03/2012    Procedure: ESOPHAGOGASTRODUODENOSCOPY (EGD);  Surgeon: Florencia Reasons, MD;  Location: [facility] ENDOSCOPY;  Service: Endoscopy;  Laterality: N/A;  .  Laparoscopy N/A 06/13/2012    Procedure: EXPLORATORY LAPAROSCOPY ;  Surgeon: Liz Malady, MD;  Location: [facility] OR;  Service: General;  Laterality: N/A;  . Diverting ileostomy N/A 06/13/2012    Procedure: DIVERTING LOOP ILEOSTOMY;  Surgeon: Liz Malady, MD;  Location: [facility] OR;  Service: General;  Laterality: N/A;  . Lymph node biopsy Left 06/13/2012    Procedure: LYMPH NODE BIOPSY;  Surgeon: Liz Malady, MD;  Location: [facility] OR;  Service: General;  Laterality: Left;  [place] Lymph Node Biopsy   Social History:  reports that he quit smoking about 28 years ago. He has never used smokeless tobacco. He reports that he drinks about 3.5 ounces of alcohol per week. He reports that he does not use illicit drugs.  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  Social History Preferred language: English Religion: Protestant Cultural Background: NA Education: college Read: Yes Write: Yes Employment Status: Retired Fish farm manager Issues: none Guardian/Conservator: none   Abuse/Neglect Physical Abuse: Denies Verbal Abuse: Denies Sexual Abuse: Denies Exploitation of  patient/patient's resources: Denies Self-Neglect: Denies  Emotional Status Pt's affect, behavior adn adjustment status: Very pleasant, talkative gentleman who speaks easily with me about his new CA diagnosis and his emotional adjustment which he sums up as "... not much I can do to change it ... gotta move on ...that's my [place] upbringing..." laughing easily with daughter  and wife and denies any significant emotional distress and no s/s of depression/ anxiety.  Does note that he has not yet told his brother of the diagnosis and feels that will be (emotionally) difficult. Recent Psychosocial Issues: two light strokes in Jan 2014 - no follow up therapy Pyschiatric History: none Substance Abuse History: none  Patient / Family Perceptions, Expectations & Goals Pt/Family understanding of illness & functional limitations: Pt and wife able to give good description of what they understand in regards to medical status and training they are undergoing with ileostomy care. Premorbid pt/family roles/activities: Pt and wife were very active at home and in the community. Anticipated changes in roles/activities/participation: Wife and children may need to provide some new caregiver support. Pt/family expectations/goals: "I want to build my strength up"  [employer]: None Premorbid Home Care/DME Agencies: None Transportation available at discharge: yes Resource referrals recommended: Support group (specify) (Cancer groups via [facility])  Discharge Planning Living Arrangements: Spouse/significant other Support Systems: Spouse/significant other;Children;Friends/neighbors;Church/faith community Type of Residence: Private residence Insurance Resources: [employer] Financial Resources: Social Security Financial Screen Referred: No Living Expenses: Own Money Management: Patient Do you have any problems obtaining your medications?: No Home Management: pt and wife share Patient/Family Preliminary Plans: Pt to d/c home with wife who can provide 24/7 assist and local, adult children who can provide intermittent support. Social Work Anticipated Follow Up Needs: HH/OP;Support Group Expected length of stay: 8-12 days  Clinical Impression Very pleasant gentleman here for deconditioning following surgery for gastric/ colon CA (newly diagnosed).  Very  pleased with progress to date and positive about moving forward with CA treatments.  Excellent family support.  No s/s of any significant emotional distress - will monitor.  Allen Basista 06/21/2012, 5:56 PM

## 2012-06-21 NOTE — Progress Notes (Signed)
Subjective/Complaints: Confusion noted last night. Still having a lot of urinary frequency. A 12 point review of systems has been performed and if not noted above is otherwise negative.   Objective: Vital Signs: Blood pressure 115/66, pulse 69, temperature 97.4 F (36.3 C), temperature source Oral, resp. rate 18, height 6\' 1"  (1.854 m), weight 81.6 kg (179 lb 14.3 oz), SpO2 96.00%. No results found.  Recent Labs  06/19/12 0720  WBC 7.5  HGB 10.0*  HCT 28.7*  PLT 377    Recent Labs  06/19/12 0720  NA 131*  K 3.9  CL 95*  GLUCOSE 107*  BUN 15  CREATININE 0.87  CALCIUM 9.5   CBG (last 3)  No results found for this basename: GLUCAP,  in the last 72 hours  Wt Readings from Last 3 Encounters:  06/18/12 81.6 kg (179 lb 14.3 oz)  06/13/12 88.4 kg (194 lb 14.2 oz)  06/13/12 88.4 kg (194 lb 14.2 oz)    Physical Exam:  General: Alert and oriented x 3, appears a little fatigued. No distress.  HEENT: Head is normocephalic, atraumatic, PERRLA, EOMI, sclera anicteric, oral mucosa pink and moist, dentition intact, ext ear canals clear,  Neck: Supple without JVD or lymphadenopathy  Heart: Reg rate and rhythm. No murmurs rubs or gallops  Chest: decreased breath sounds at bases. no wheezes, rales, or rhonchi; no distress  Abdomen: Soft, non-tender, non-distended, bowel sounds positive. Tender around ostomy site, some irritation around the inferior border, appears fungal.  Extremities: No clubbing, cyanosis, or edema. Pulses are 2+  Skin: wounds clean and well approximated. Ostomy well adhered.  Neuro: Pt is cognitively appropriate with improved but still basic insight, memory, and awareness. Sometimes processing delayed. Cranial nerves 2-12 are intact. Sensory exam appears intact for pinprick and light touch. Reflexes are 2+ in all 4's. Fine motor coordination is slightly diminished. Has mild intentional right greater than left tremors. Motor function is grossly 4/5 ue prox to distally.  Lower ext 2+ to 3- at HF, 3 KE, 3-4 ADF and APF Musculoskeletal: Full ROM, No pain with PROM in the neck, trunk, or extremities. Decreased truncal control as a whole  Psych: Pt's affect is appropriate. Pt is cooperative. Conversationally fairly appropriate.  Assessment/Plan: 1. Functional deficits secondary to metastatic gastric cancer and ex lap. Post-op encephalopathy which require 3+ hours per day of interdisciplinary therapy in a comprehensive inpatient rehab setting. Physiatrist is providing close team supervision and 24 hour management of active medical problems listed below. Physiatrist and rehab team continue to assess barriers to discharge/monitor patient progress toward functional and medical goals. FIM: FIM - Bathing Bathing Steps Patient Completed: Chest;Right Arm;Left Arm;Abdomen;Front perineal area;Buttocks;Right upper leg;Left upper leg;Right lower leg (including foot);Left lower leg (including foot) Bathing: 5: Supervision: Safety issues/verbal cues  FIM - Upper Body Dressing/Undressing Upper body dressing/undressing steps patient completed: Thread/unthread right sleeve of pullover shirt/dresss;Thread/unthread left sleeve of pullover shirt/dress;Put head through opening of pull over shirt/dress;Pull shirt over trunk Upper body dressing/undressing: 5: Supervision: Safety issues/verbal cues FIM - Lower Body Dressing/Undressing Lower body dressing/undressing steps patient completed: Thread/unthread right pants leg;Thread/unthread left pants leg;Pull pants up/down;Don/Doff left sock;Don/Doff right shoe Lower body dressing/undressing: 5: Supervision: Safety issues/verbal cues  FIM - Toileting Toileting: 0: Activity did not occur  FIM - Archivist Transfers: 0-Activity did not occur  FIM - Banker Devices: Arm rests Bed/Chair Transfer: 4: Supine > Sit: Min A (steadying Pt. > 75%/lift 1 leg);4: Bed > Chair or W/C: Min  A  (steadying Pt. > 75%);4: Chair or W/C > Bed: Min A (steadying Pt. > 75%)  FIM - Locomotion: Wheelchair Locomotion: Wheelchair: 5: Travels 150 ft or more: maneuvers on rugs and over door sills with supervision, cueing or coaxing FIM - Locomotion: Ambulation Locomotion: Ambulation Assistive Devices: Designer, industrial/product Ambulation/Gait Assistance: 4: Min assist Locomotion: Ambulation: 2: Travels 50 - 149 ft with minimal assistance (Pt.>75%)  Comprehension Comprehension Mode: Auditory Comprehension: 3-Understands basic 50 - 74% of the time/requires cueing 25 - 50%  of the time  Expression Expression Mode: Verbal Expression: 3-Expresses basic 50 - 74% of the time/requires cueing 25 - 50% of the time. Needs to repeat parts of sentences.  Social Interaction Social Interaction: 2-Interacts appropriately 25 - 49% of time - Needs frequent redirection.  Problem Solving Problem Solving: 1-Solves basic less than 25% of the time - needs direction nearly all the time or does not effectively solve problems and may need a restraint for safety  Memory Memory: 2-Recognizes or recalls 25 - 49% of the time/requires cueing 51 - 75% of the time  Medical Problem List and Plan:  1. DVT Prophylaxis/Anticoagulation: Pharmaceutical: Xarelto  2. Pain Management: Limit narcotics due to sedative SE. Unable to tolerate oxycodone or morphine per reports. Will use tylenol prn and monitor for complaints.  3. Mood: seems to be appropriate. Wife and patient (to an extent aware of diagnosis) aware of diagnosis. Will have LCSW follow for support.  4. Neuropsych: This patient is generally capable of making decisions on his/her own behalf.  5. Hyponatremia: follow up labs 6. CAF: Monitor HR with bid check. Continue Cardizem and Xarelto.  7. HTN: Will monitor with bid checks.  8. 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 Dysarthria fluctuates depending on fatigue.  9. GIB/ABLA: Hgb stable on pradaxa. Will recheck in am and monitor for  signs of bleeding.  10. LLL PNA: on levaquin D#4/ 7-10. Afebrile, no oxygen presently--moving air better 11. Stage IV metastatic AdenoCA of stomach: to follow up with Dr. Truett Perna post discharge  12. Sundowning: a little worse last night without his wife in room. Back to baseline this am 13. Bladder. Timed voids. Fluid rationing at night  -ua neg, culture pending still  -pvr's a little high thus far, has decreased flow  -flomax started last night.  LOS (Days) 3 A FACE TO FACE EVALUATION WAS PERFORMED  Kristalyn Bergstresser T 06/21/2012 8:49 AM

## 2012-06-21 NOTE — Progress Notes (Signed)
Physical Therapy Session Note  Patient Details  Name: DICKEY CAAMANO MRN: [id number] Date of Birth: 04-May-1930  Today's Date: 06/21/2012 Time: 5621-3086 Time Calculation (min): 30 min  Short Term Goals: Week 1:  PT Short Term Goal 1 (Week 1): = LTGs  Skilled Therapeutic Interventions/Progress Updates:    Focused session on overall activity tolerance, gait training with and without AD, dynamic gait and balance activities, and basic transfers. Pt close S using RW for gait; and HHA without AD. Standing balance activity while holding therapy ball in PNF diagonals and vertical movement patterns with steady A.   Therapy Documentation Precautions:  Precautions Precautions: Fall Precaution Comments: ileostomy, abdominal incisional wound Restrictions Weight Bearing Restrictions: No   Pain:  No complaints.   Locomotion : Ambulation Ambulation/Gait Assistance: 5: Supervision   See FIM for current functional status  Therapy/Group: Individual Therapy  Karolee Stamps [facility] 06/21/2012, 4:00 PM

## 2012-06-21 NOTE — Progress Notes (Signed)
OccupationalTherapy Note  Patient Details  Name: [name] [name] MRN: [id number] Date of Birth: 08-13-1930 Today's Date: 06/21/2012  Time:  1100-1200  (60 min)  1st session  Pain:  None   Individual session Patient found seated in recliner. Patient engaged in functional ambulation/mobility from recliner -> gather necessary items then to bathroom for shower stall transfer. Patient then engaged in ADL retraining in sit<>stand position using grab bars.  Pt. Needed assist with regulating shower temperature control.  Provided set up at end of shower for towels to dry and dried floor for safety.  Pt. Ambulated to toilet and completed dressing.    2nd session:  1400-1445  (45 min)  Pain:  None   Individual session Pt. Ambulated to sink with RW to engage in grooming activities.  Pt. Stood for 15 mins while shaving and brushing teeth.  Used sink as support for balance.  Ppt ambulated to bathroom.  Educated pt on emptying colon ostomy bag.  Showed pt steps, but he did not practice.  This needs reinforcement.      Humberto Seals 06/21/2012, 11:21 AM

## 2012-06-22 ENCOUNTER — Inpatient Hospital Stay (HOSPITAL_COMMUNITY): Payer: Medicare Other | Admitting: Physical Therapy

## 2012-06-22 ENCOUNTER — Inpatient Hospital Stay (HOSPITAL_COMMUNITY): Payer: Medicare Other

## 2012-06-22 ENCOUNTER — Inpatient Hospital Stay (HOSPITAL_COMMUNITY): Payer: Medicare Other | Admitting: Occupational Therapy

## 2012-06-22 DIAGNOSIS — G9341 Metabolic encephalopathy: Secondary | ICD-10-CM

## 2012-06-22 DIAGNOSIS — C169 Malignant neoplasm of stomach, unspecified: Secondary | ICD-10-CM

## 2012-06-22 DIAGNOSIS — R5381 Other malaise: Secondary | ICD-10-CM

## 2012-06-22 NOTE — Progress Notes (Signed)
Physical Therapy Session Note  Patient Details  Name: [name] [name] MRN: [id number] Date of Birth: May 21, 1930  Today's Date: 06/22/2012 Time: 0800-0855 Time Calculation (min): 55 min  Short Term Goals: Week 1:  PT Short Term Goal 1 (Week 1): = LTGs  Skilled Therapeutic Interventions/Progress Updates:    Dressing edge of bed independently working on dynamic sitting and standing balance. Gait down to/from therapy gym for endurance and strengthening with close S using RW. Standing therex on Kinetron initially on resistance of 30 and progressed down to 20 with 1 minute bouts of activity and rest breaks as needed. Dynamic standing balance and coordination activity to tap and then step to the numbered plates on the floor with a given pattern by the therapist overall steady A without AD. Seated therex including heel/toe raises, LAQ and seated marches x 10 reps x 2 sets.   Therapy Documentation Precautions:  Precautions Precautions: Fall Precaution Comments: ileostomy, abdominal incisional wound Restrictions Weight Bearing Restrictions: No Pain:  Denies pain.  See FIM for current functional status  Therapy/Group: Individual Therapy  [name] [name] [facility] 06/22/2012, 8:59 AM

## 2012-06-22 NOTE — Progress Notes (Signed)
Occupational Therapy Session Note  Patient Details  Name: [name] [name] MRN: [id number] Date of Birth: 1931/01/06  Today's Date: 06/22/2012 Time: 1300-1355 and 0935-1020 Time Calculation (min): 55 minn and 45 minutes=45 total minutes    Skilled Therapeutic Interventions/Progress Updates: first session:  Scheduled for ADL but patient nauseated and elected to wash face and don teds and shoes, and this clinician emptied his colostomy bag of 250 thin dark green liquid.  Second session:   Patient stated no nausea present.  He completed dynamic tasks standing without assistive device and without nausea for about 6 minutes per try.   Upon observation of scapular mobilization, very tight trapezius and rhomboid muscles on left were noted.  Manual therapy to help patient stretch these muscles and increase strength and ROM were completed.   This clinician also applied moist heat to the whole upper to middle back for about 20 minutes as patient c/o tightness in the aforementioned area.     Therapy Documentation Precautions:  Precautions Precautions: Fall Precaution Comments: ileostomy, abdominal incisional wound Restrictions Weight Bearing Restrictions: No  Pain: denied except on very tight upper and middle left back upon stretching and ROM with clinician   See FIM for current functional status  Therapy/Group: Individual Therapy  Bud Face [facility] 06/22/2012, 4:13 PM

## 2012-06-22 NOTE — Progress Notes (Signed)
Patient ID: [name] [name], male   DOB: [birth year], 79 y.o.   MRN: [id number] Subjective/Complaints: Ambulating in hall with PT no c/os.  Uses walker with min guard A 12 point review of systems has been performed and if not noted above is otherwise negative.   Objective: Vital Signs: Blood pressure 117/78, pulse 72, temperature 97.9 F (36.6 C), temperature source Oral, resp. rate 18, height 6\' 1"  (1.854 m), weight 81.6 kg (179 lb 14.3 oz), SpO2 95.00%. No results found. No results found for this basename: WBC, HGB, HCT, PLT,  in the last 72 hours No results found for this basename: NA, K, CL, CO, GLUCOSE, BUN, CREATININE, CALCIUM,  in the last 72 hours CBG (last 3)  No results found for this basename: GLUCAP,  in the last 72 hours  Wt Readings from Last 3 Encounters:  06/18/12 81.6 kg (179 lb 14.3 oz)  06/13/12 88.4 kg (194 lb 14.2 oz)  06/13/12 88.4 kg (194 lb 14.2 oz)    Physical Exam:  General: Alert and oriented x 3, appears a little fatigued. No distress.  HEENT: Head is normocephalic, atraumatic, PERRLA, EOMI, sclera anicteric, oral mucosa pink and moist, dentition intact, ext ear canals clear,  Neck: Supple without JVD or lymphadenopathy  Heart: Reg rate and rhythm. No murmurs rubs or gallops  Chest: decreased breath sounds at bases. no wheezes, rales, or rhonchi; no distress  Abdomen: Soft, non-tender, non-distended, bowel sounds positive. Tender around ostomy site, some irritation around the inferior border, appears fungal.  Extremities: No clubbing, cyanosis, or edema. Pulses are 2+  Skin: wounds clean and well approximated. Ostomy well adhered.   Psych: Pt's affect is appropriate. Pt is cooperative. Conversationally fairly appropriate.  Assessment/Plan: 1. Functional deficits secondary to metastatic gastric cancer and ex lap. Post-op encephalopathy which require 3+ hours per day of interdisciplinary therapy in a comprehensive inpatient rehab setting. Physiatrist is  providing close team supervision and 24 hour management of active medical problems listed below. Physiatrist and rehab team continue to assess barriers to discharge/monitor patient progress toward functional and medical goals. FIM: FIM - Bathing Bathing Steps Patient Completed: Chest;Right Arm;Left Arm;Abdomen;Front perineal area;Buttocks;Right upper leg;Left upper leg;Right lower leg (including foot);Left lower leg (including foot) Bathing: 5: Supervision: Safety issues/verbal cues  FIM - Upper Body Dressing/Undressing Upper body dressing/undressing steps patient completed: Thread/unthread right sleeve of pullover shirt/dresss;Thread/unthread left sleeve of pullover shirt/dress;Put head through opening of pull over shirt/dress;Pull shirt over trunk Upper body dressing/undressing: 5: Supervision: Safety issues/verbal cues FIM - Lower Body Dressing/Undressing Lower body dressing/undressing steps patient completed: Thread/unthread right pants leg;Thread/unthread left pants leg;Pull pants up/down;Don/Doff left sock;Don/Doff right shoe Lower body dressing/undressing: 4: Steadying Assist  FIM - Toileting Toileting: 0: Activity did not occur  FIM - Diplomatic Services operational officer Devices: Grab bars;Walker Toilet Transfers: 4-From toilet/BSC: Min A (steadying Pt. > 75%);4-To toilet/BSC: Min A (steadying Pt. > 75%)  FIM - Bed/Chair Transfer Bed/Chair Transfer Assistive Devices: Arm rests;Walker Bed/Chair Transfer: 5: Supine > Sit: Supervision (verbal cues/safety issues);5: Bed > Chair or W/C: Supervision (verbal cues/safety issues)  FIM - Locomotion: Wheelchair Locomotion: Wheelchair: 5: Travels 150 ft or more: maneuvers on rugs and over door sills with supervision, cueing or coaxing FIM - Locomotion: Ambulation Locomotion: Ambulation Assistive Devices: Designer, industrial/product Ambulation/Gait Assistance: 5: Supervision Locomotion: Ambulation: 1: Travels less than 50 ft with  supervision/safety issues  Comprehension Comprehension Mode: Auditory Comprehension: 4-Understands basic 75 - 89% of the time/requires cueing 10 - 24% of the time  Expression Expression Mode: Verbal Expression: 5-Expresses complex 90% of the time/cues < 10% of the time  Social Interaction Social Interaction: 4-Interacts appropriately 75 - 89% of the time - Needs redirection for appropriate language or to initiate interaction.  Problem Solving Problem Solving: 3-Solves basic 50 - 74% of the time/requires cueing 25 - 49% of the time  Memory Memory: 3-Recognizes or recalls 50 - 74% of the time/requires cueing 25 - 49% of the time  Medical Problem List and Plan:  1. DVT Prophylaxis/Anticoagulation: Pharmaceutical: Xarelto  2. Pain Management: Limit narcotics due to sedative SE. Unable to tolerate oxycodone or morphine per reports. Will use tylenol prn and monitor for complaints.  3. Mood: seems to be appropriate. Wife and patient (to an extent aware of diagnosis) aware of diagnosis. Will have LCSW follow for support.  4. Neuropsych: This patient is generally capable of making decisions on his/her own behalf.  5. Hyponatremia: follow up labs 6. CAF: Monitor HR with bid check. Continue Cardizem and Xarelto.  7. HTN: Will monitor with bid checks.  8. 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 Dysarthria fluctuates depending on fatigue.  9. GIB/ABLA: Hgb stable on pradaxa. Will recheck in am and monitor for signs of bleeding.  10. LLL PNA: on levaquin D#5/ 7-10. Afebrile, no oxygen presently--moving air better 11. Stage IV metastatic AdenoCA of stomach: to follow up with Dr. Truett Perna post discharge  12. Sundowning: a little worse last night without his wife in room. Back to baseline this am 13. Bladder. Timed voids. Fluid rationing at night  -ua neg, culture pending still  -pvr's a little high thus far, has decreased flow  -flomax started last night.  LOS (Days) 4 A FACE TO FACE EVALUATION WAS  PERFORMED  Erick Colace 06/22/2012 8:13 AM

## 2012-06-22 NOTE — Progress Notes (Signed)
Physical Therapy Note  Patient Details  Name: [name] [name] MRN: [id number] Date of Birth: Jul 29, 1930 Today's Date: 06/22/2012  1430-1525 (55 minutes) group Pain: no reported pain Pt participated in PT group session focused on gait training/safety/endurance. Therapeutic exercise- Nustep Level 4 LEs only X 10 minutes; gait 150 feet X 3 RW close SBA.    Liliyana Thobe,JIM 06/22/2012, 3:41 PM

## 2012-06-23 ENCOUNTER — Inpatient Hospital Stay (HOSPITAL_COMMUNITY): Payer: Medicare Other | Admitting: Occupational Therapy

## 2012-06-23 NOTE — Progress Notes (Signed)
Patient ID: [name] [name], male   DOB: 19-Jul-1930, 79 y.o.   MRN: [id number] Subjective/Complaints: Ambulating in hall with PT no c/os.  Uses walker with min guard A 12 point review of systems has been performed and if not noted above is otherwise negative.   Objective: Vital Signs: Blood pressure 124/75, pulse 71, temperature 97.7 F (36.5 C), temperature source Oral, resp. rate 19, height 6\' 1"  (1.854 m), weight 81.6 kg (179 lb 14.3 oz), SpO2 95.00%. No results found. No results found for this basename: WBC, HGB, HCT, PLT,  in the last 72 hours No results found for this basename: NA, K, CL, CO, GLUCOSE, BUN, CREATININE, CALCIUM,  in the last 72 hours CBG (last 3)  No results found for this basename: GLUCAP,  in the last 72 hours  Wt Readings from Last 3 Encounters:  06/18/12 81.6 kg (179 lb 14.3 oz)  06/13/12 88.4 kg (194 lb 14.2 oz)  06/13/12 88.4 kg (194 lb 14.2 oz)    Physical Exam:  General: Alert and oriented x 3, appears a little fatigued. No distress.  HEENT: Head is normocephalic, atraumatic, PERRLA, EOMI, sclera anicteric, oral mucosa pink and moist, dentition intact, ext ear canals clear,  Neck: Supple without JVD or lymphadenopathy  Heart: Reg rate and rhythm. No murmurs rubs or gallops  Chest: decreased breath sounds at bases. no wheezes, rales, or rhonchi; no distress  Abdomen: Soft, non-tender, non-distended, bowel sounds positive. Tender around ostomy site, some irritation around the inferior border, appears fungal.  Extremities: No clubbing, cyanosis, or edema. Pulses are 2+  Skin: wounds clean and well approximated. Ostomy well adhered.   Psych: Pt's affect is appropriate. Pt is cooperative. Conversationally fairly appropriate.  Assessment/Plan: 1. Functional deficits secondary to metastatic gastric cancer and ex lap. Post-op encephalopathy which require 3+ hours per day of interdisciplinary therapy in a comprehensive inpatient rehab setting. Physiatrist is  providing close team supervision and 24 hour management of active medical problems listed below. Physiatrist and rehab team continue to assess barriers to discharge/monitor patient progress toward functional and medical goals. FIM: FIM - Bathing Bathing Steps Patient Completed: Chest;Right Arm;Left Arm;Abdomen;Front perineal area;Buttocks;Right upper leg;Left upper leg;Right lower leg (including foot);Left lower leg (including foot) Bathing: 5: Supervision: Safety issues/verbal cues  FIM - Upper Body Dressing/Undressing Upper body dressing/undressing steps patient completed: Thread/unthread right sleeve of pullover shirt/dresss;Thread/unthread left sleeve of pullover shirt/dress;Put head through opening of pull over shirt/dress;Pull shirt over trunk Upper body dressing/undressing: 5: Supervision: Safety issues/verbal cues FIM - Lower Body Dressing/Undressing Lower body dressing/undressing steps patient completed: Thread/unthread right pants leg;Thread/unthread left pants leg;Pull pants up/down;Don/Doff left sock;Don/Doff right shoe Lower body dressing/undressing: 4: Steadying Assist  FIM - Toileting Toileting: 0: Activity did not occur  FIM - Diplomatic Services operational officer Devices: Grab bars;Walker Toilet Transfers: 4-From toilet/BSC: Min A (steadying Pt. > 75%);4-To toilet/BSC: Min A (steadying Pt. > 75%)  FIM - Bed/Chair Transfer Bed/Chair Transfer Assistive Devices: Arm rests;Walker;Bed rails Bed/Chair Transfer: 6: Supine > Sit: No assist;5: Bed > Chair or W/C: Supervision (verbal cues/safety issues)  FIM - Locomotion: Wheelchair Locomotion: Wheelchair: 0: Activity did not occur (gait to/from therapy) FIM - Locomotion: Ambulation Locomotion: Ambulation Assistive Devices: Designer, industrial/product Ambulation/Gait Assistance: 5: Supervision Locomotion: Ambulation: 5: Travels 150 ft or more with supervision/safety issues  Comprehension Comprehension Mode: Auditory Comprehension:  5-Understands complex 90% of the time/Cues < 10% of the time  Expression Expression Mode: Verbal Expression: 5-Expresses complex 90% of the time/cues < 10% of  the time  Social Interaction Social Interaction: 4-Interacts appropriately 75 - 89% of the time - Needs redirection for appropriate language or to initiate interaction.  Problem Solving Problem Solving: 3-Solves basic 50 - 74% of the time/requires cueing 25 - 49% of the time  Memory Memory: 4-Recognizes or recalls 75 - 89% of the time/requires cueing 10 - 24% of the time  Medical Problem List and Plan:  1. DVT Prophylaxis/Anticoagulation: Pharmaceutical: Xarelto  2. Pain Management: Limit narcotics due to sedative SE. Unable to tolerate oxycodone or morphine per reports. Will use tylenol prn and monitor for complaints.  3. Mood: seems to be appropriate. Wife and patient (to an extent aware of diagnosis) aware of diagnosis. Will have LCSW follow for support.  4. Neuropsych: This patient is generally capable of making decisions on his/her own behalf.  5. Hyponatremia: follow up labs 6. CAF: Monitor HR with bid check. Continue Cardizem and Xarelto.  7. HTN: Will monitor with bid checks.  8. 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 Dysarthria fluctuates depending on fatigue.  9. GIB/ABLA: Hgb stable on pradaxa. Will recheck in am and monitor for signs of bleeding.  10. LLL PNA: on levaquin D#6/ 7-10. Afebrile, no oxygen presently--moving air better 11. Stage IV metastatic AdenoCA of stomach: to follow up with Dr. Truett Perna post discharge  12. Sundowning: does not appear to be an issue at this time 13. Bladder. Timed voids. Fluid rationing at night  -ua neg, culture shows coag neg staph prob contaminant, no antibiotic tx  -pvr's a little high thus far, has decreased flow  -flomax started last night.  LOS (Days) 5 A FACE TO FACE EVALUATION WAS PERFORMED  Mekiyah Gladwell E 06/23/2012 8:02 AM

## 2012-06-23 NOTE — Progress Notes (Signed)
Refusing scheduled trazodone, reports increased confusion when taking. Yeast like rash to sacrum, gerharts butt cream applied per orders. Wife at bedside at [facility] voicing concerns about patient's urinary incontinence during night. Discussed with wife about trying condom cath at night. Attempted condom cath, but either came off or patient pulled off. Required total assist with linen change and hygiene. Used urinal 2 times after condom cath came off. Decreased safety awareness, bed alarm alerted staff  twice of patient getting up without assistance. BLE's with pitting edema, left >right. Stool liquid and green from ileostomy. Alfredo Martinez A

## 2012-06-23 NOTE — Progress Notes (Signed)
1245 Patient showered and ileostomy bag leaking.  Wife at bedside and RN observed return demonstration of wife changing wafer and bag on patient.  Return demonstration successful.  Wife to provide daily stoma care at home upon discharge.

## 2012-06-23 NOTE — Progress Notes (Signed)
Occupational Therapy Session Note  Patient Details  Name: [name] [name] MRN: [id number] Date of Birth: 1930/09/15  Today's Date: 06/23/2012 Time: 1130-1230 Time Calculation (min): 60 min   Skilled Therapeutic Interventions/Progress Updates: ADL in room shower with focus on balance with functional mobility and standing in wet shower.    Colostomy bag not attached in one area during end of shower and leaked out.    Wife able to recall training from wound nurse to demonstrate to patient's nurse today how to apply new colostomy bag.   Patient positive and motivated for completing therapy and returning home.  Wife supportive of patient's progress     Therapy Documentation Precautions:  Precautions Precautions: Fall Precaution Comments: ileostomy, abdominal incisional wound Restrictions Weight Bearing Restrictions: No  Pain: Pain Assessment Pain Assessment: No/denies pain Pain Score: 0-No pain  See FIM for current functional status  Therapy/Group: Individual Therapy  Bud Face [facility] 06/23/2012, 4:37 PM

## 2012-06-24 ENCOUNTER — Inpatient Hospital Stay (HOSPITAL_COMMUNITY): Payer: Medicare Other

## 2012-06-24 ENCOUNTER — Inpatient Hospital Stay (HOSPITAL_COMMUNITY): Payer: Medicare Other | Admitting: Physical Therapy

## 2012-06-24 ENCOUNTER — Telehealth: Payer: Self-pay | Admitting: *Deleted

## 2012-06-24 ENCOUNTER — Inpatient Hospital Stay (HOSPITAL_COMMUNITY): Payer: Medicare Other | Admitting: Occupational Therapy

## 2012-06-24 ENCOUNTER — Inpatient Hospital Stay (HOSPITAL_COMMUNITY): Payer: Medicare Other | Admitting: *Deleted

## 2012-06-24 DIAGNOSIS — C169 Malignant neoplasm of stomach, unspecified: Secondary | ICD-10-CM

## 2012-06-24 LAB — BASIC METABOLIC PANEL
BUN: 17 mg/dL (ref 6–23)
Chloride: 100 mEq/L (ref 96–112)
GFR calc non Af Amer: 64 mL/min — ABNORMAL LOW (ref >90)
Glucose, Bld: 97 mg/dL (ref 70–99)
Potassium: 4.1 mEq/L (ref 3.5–5.1)
Sodium: 136 mEq/L (ref 135–145)

## 2012-06-24 MED ORDER — NYSTATIN 100000 UNIT/GM EX CREA: TOPICAL_CREAM | Freq: Two times a day (BID) | CUTANEOUS | Status: DC

## 2012-06-24 MED ORDER — NITROFURANTOIN MONOHYD MACRO 100 MG PO CAPS
100.0000 mg | ORAL_CAPSULE | Freq: Two times a day (BID) | ORAL | Status: DC
  Administered 2012-06-24 – 2012-06-26 (×5): 100 mg via ORAL
  Filled 2012-06-24 (×9): qty 1

## 2012-06-24 MED ORDER — QUETIAPINE FUMARATE 25 MG PO TABS
25.0000 mg | ORAL_TABLET | Freq: Every day | ORAL | Status: DC
  Administered 2012-06-24 – 2012-06-25 (×2): 25 mg via ORAL
  Filled 2012-06-24 (×4): qty 1

## 2012-06-24 MED ORDER — FLUCONAZOLE 200 MG PO TABS
200.0000 mg | ORAL_TABLET | Freq: Once | ORAL | Status: AC
  Administered 2012-06-24: 200 mg via ORAL
  Filled 2012-06-24: qty 1

## 2012-06-24 NOTE — Progress Notes (Addendum)
Patient ID: [name] [name], male   DOB: 1931-01-15, 79 y.o.   MRN: [id number] Subjective/Complaints: Pt aware of confusion and restlessness at night. Refused trazodone A 12 point review of systems has been performed and if not noted above is otherwise negative.   Objective: Vital Signs: Blood pressure 131/79, pulse 65, temperature 98.1 F (36.7 C), temperature source Oral, resp. rate 19, height 6\' 1"  (1.854 m), weight 81.6 kg (179 lb 14.3 oz), SpO2 98.00%. No results found. No results found for this basename: WBC, HGB, HCT, PLT,  in the last 72 hours  Recent Labs  06/24/12 0540  NA 136  K 4.1  CL 100  GLUCOSE 97  BUN 17  CREATININE 1.07  CALCIUM 9.6   CBG (last 3)  No results found for this basename: GLUCAP,  in the last 72 hours  Wt Readings from Last 3 Encounters:  06/18/12 81.6 kg (179 lb 14.3 oz)  06/13/12 88.4 kg (194 lb 14.2 oz)  06/13/12 88.4 kg (194 lb 14.2 oz)    Physical Exam:  General: Alert and oriented x 3, appears a little fatigued. No distress.  HEENT: Head is normocephalic, atraumatic, PERRLA, EOMI, sclera anicteric, oral mucosa pink and moist, dentition intact, ext ear canals clear,  Neck: Supple without JVD or lymphadenopathy  Heart: Reg rate and rhythm. No murmurs rubs or gallops  Chest: decreased breath sounds at bases. no wheezes, rales, or rhonchi; no distress  Abdomen: Soft, non-tender, non-distended, bowel sounds positive. Tender around ostomy site, some irritation around the inferior border, appears fungal.  Extremities: No clubbing, cyanosis, or edema. Pulses are 2+  Skin: wounds clean and well approximated. Ostomy well adhered.   Psych: Pt's affect is appropriate. Pt is cooperative. Conversationally fairly appropriate.  Assessment/Plan: 1. Functional deficits secondary to metastatic gastric cancer and ex lap. Post-op encephalopathy which require 3+ hours per day of interdisciplinary therapy in a comprehensive inpatient rehab  setting. Physiatrist is providing close team supervision and 24 hour management of active medical problems listed below. Physiatrist and rehab team continue to assess barriers to discharge/monitor patient progress toward functional and medical goals. FIM: FIM - Bathing Bathing Steps Patient Completed: Chest;Right Arm;Left Arm;Abdomen;Front perineal area;Buttocks;Right upper leg;Left upper leg;Right lower leg (including foot);Left lower leg (including foot) Bathing: 5: Supervision: Safety issues/verbal cues  FIM - Upper Body Dressing/Undressing Upper body dressing/undressing steps patient completed: Thread/unthread right sleeve of pullover shirt/dresss;Thread/unthread left sleeve of pullover shirt/dress;Put head through opening of pull over shirt/dress;Pull shirt over trunk Upper body dressing/undressing: 5: Supervision: Safety issues/verbal cues FIM - Lower Body Dressing/Undressing Lower body dressing/undressing steps patient completed: Thread/unthread right underwear leg;Thread/unthread left underwear leg;Pull underwear up/down;Thread/unthread right pants leg;Thread/unthread left pants leg;Pull pants up/down;Fasten/unfasten pants;Don/Doff right sock;Don/Doff left sock Lower body dressing/undressing: 5: Supervision: Safety issues/verbal cues  FIM - Toileting Toileting: 0: Activity did not occur  FIM - Diplomatic Services operational officer Devices: Grab bars;Walker Toilet Transfers: 0-Activity did not occur  FIM - Banker Devices: Arm rests;Walker;Bed rails Bed/Chair Transfer: 6: Supine > Sit: No assist;5: Bed > Chair or W/C: Supervision (verbal cues/safety issues)  FIM - Locomotion: Wheelchair Locomotion: Wheelchair: 0: Activity did not occur (gait to/from therapy) FIM - Locomotion: Ambulation Locomotion: Ambulation Assistive Devices: Designer, industrial/product Ambulation/Gait Assistance: 5: Supervision Locomotion: Ambulation: 5: Travels 150 ft or  more with supervision/safety issues  Comprehension Comprehension Mode: Auditory Comprehension: 5-Understands complex 90% of the time/Cues < 10% of the time  Expression Expression Mode: Verbal Expression: 5-Expresses complex 90%  of the time/cues < 10% of the time  Social Interaction Social Interaction: 4-Interacts appropriately 75 - 89% of the time - Needs redirection for appropriate language or to initiate interaction.  Problem Solving Problem Solving: 3-Solves basic 50 - 74% of the time/requires cueing 25 - 49% of the time  Memory Memory: 4-Recognizes or recalls 75 - 89% of the time/requires cueing 10 - 24% of the time  Medical Problem List and Plan:  1. DVT Prophylaxis/Anticoagulation: Pharmaceutical: Xarelto  2. Pain Management: Limit narcotics due to sedative SE. Unable to tolerate oxycodone or morphine per reports. Will use tylenol prn and monitor for complaints.  3. Mood: seems to be appropriate. Wife and patient (to an extent aware of diagnosis) aware of diagnosis. Will have LCSW follow for support.  4. Neuropsych: This patient is generally capable of making decisions on his/her own behalf.  5. Hyponatremia: follow up labs 6. CAF: Monitor HR with bid check. Continue Cardizem and Xarelto.  7. HTN: Will monitor with bid checks.  8. 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 Dysarthria fluctuates depending on fatigue.  9. GIB/ABLA: Hgb stable on pradaxa. Will recheck in am and monitor for signs of bleeding.  10. LLL PNA: dc levaquin, recheck cxr. 11. Stage IV metastatic AdenoCA of stomach: to follow up with Dr. Truett Perna post discharge  12. Sundowning: continued problems over the weekend  -rx UTI  -add low dose seroquel  -had been doing much better when wife was present  -will discuss with his wife as well. 13. Bladder. Timed voids. Fluid rationing at night  -culture positive for coag neg staph. Will add macrobid for 5 days given options  -continue flomax  LOS (Days) 6 A FACE TO FACE  EVALUATION WAS PERFORMED  Kacyn Souder T 06/24/2012 7:56 AM

## 2012-06-24 NOTE — Progress Notes (Signed)
Refusing scheduled trazodone this weekend. Attempted condom cath Saturday night, but not effective, pulled off within an hour. Wife concerned about patient being incontinent in bed at home. Talked with patient and wife at [facility] about attempting to toilet patient tonight every 2-3 hours, both agreeable. Continent with urinal at 2200, but at 2345 went to assist with timed toileting, Patient was incontinent of urine and had ripped the ostomy bag, almost in half!!! Stool all over bed. Patient reports dreaming about a high school ball game and balls being thrown at him. "I get so violent at night!" Patient concerned about increased confusion at night and how he will manage that at home. Alert and oriented to self only at times during the night. At 0200 incontinent of urine, PVR=126. Yeast like rash to buttocks and groin-using scheduled butt cream, also using microgaurd powder PRN. BLE's with pitting edema, left > right. Assisted with I.S., able to do 2500! [name] [name] A

## 2012-06-24 NOTE — Progress Notes (Signed)
Physical Therapy Session Note  Patient Details  Name: [name] [name] MRN: [id number] Date of Birth: November 01, 1930  Today's Date: 06/24/2012 Time: 4098-1191 Time Calculation (min): 41 min  Short Term Goals: Week 1:  PT Short Term Goal 1 (Week 1): = LTGs  Skilled Therapeutic Interventions/Progress Updates:    Wife providing supervision with ambulation to/from gym with RW, provides appropriate cuing for safety learned from previous session. Practiced 2 floor transfers after PT demonstration, first with PT providing supervision and second attempt with wife providing supervision/cues. Educated pt and wife on risk of injury and when to call for assistance post fall via teach back method.   Ambulation without device 2 x 120' with frequent min assist for lateral sway and narrow base that worsens with distraction or visual scanning. Added head movements and direction changes to increase challenge, pt significantly slows gait speed and has more loss of balance with these challenges. Wife and pt aware pt will continue to need RW at home. Standing balance on compliant surface with ball toss at limits of stability and at varied speeds.   Therapy Documentation Precautions:  Precautions Precautions: Fall Precaution Comments: ileostomy, abdominal incisional wound Restrictions Weight Bearing Restrictions: No Pain: Pain Assessment Pain Assessment: No/denies pain  See FIM for current functional status  Therapy/Group: Individual Therapy  Wilhemina Bonito 06/24/2012, 11:40 AM

## 2012-06-24 NOTE — Progress Notes (Signed)
Occupational Therapy Session Note  Patient Details  Name: ZAFAR DEBROSSE MRN: [id number] Date of Birth: 16-Apr-1930  Today's Date: 06/24/2012 Time:  -  0800-0900  ( )    Short Term Goals: Week 1:  OT Short Term Goal 1 (Week 1): STGs equal LTGs secondary to ELOS.  Overall modified independent level expected. :    :     Skilled Therapeutic Interventions/Progress Updates:    Engaged in bathing and dressing at sink.  Addressed balance, sit to stand, organizing and sequencing, functional mobility, endurance, transfers.  Pt ambulated to gather clothes and went to bathroom.  OT emptied ostomy bag as pt watched.  Provided instructional cues on how to empty.  Pt. Remained in bathroom and sponged bath and dressed.  Pt. Went from sit to stand with supervision . Pt. Exhibited no LOB during session.  Reported having a rough night with restlessness and wetting the bed.     Therapy Documentation Precautions:  Precautions Precautions: Fall Precaution Comments: ileostomy, abdominal incisional wound Restrictions Weight Bearing Restrictions: No    Vital Signs: O2 Device: None (Room air) Pain:  none   ADL: See FIM for current functional status  Therapy/Group: Individual Therapy  Humberto Seals 06/24/2012, 8:55 AM

## 2012-06-24 NOTE — Telephone Encounter (Signed)
Spoke with [name] in pathology and requested HER2-Neu testing on Accession #: [id number] per request of Dr. Truett Perna.

## 2012-06-24 NOTE — Progress Notes (Signed)
Physical Therapy Session Note  Patient Details  Name: [name] [name] MRN: [id number] Date of Birth: [birth year]  Today's Date: 06/24/2012 Time: 0930-1025 Time Calculation (min): 55 min  Short Term Goals: Week 1:  PT Short Term Goal 1 (Week 1): = LTGs  Skilled Therapeutic Interventions/Progress Updates:    Pt and wife report pt still having difficulties at night with disorientation - MD is aware. Focused session on family education with pt's wife in regards to basic transfers, gait with RW, dynamic balance, simulated car transfer with RW, w/c parts management and breakdown, energy conservation, community mobility (modfications for w/c level), HEP for strengthening and balance, stairs and general safety using teach back method for the above. Pt still requiring cues intermittently for safety (hand placement with transfers) and use of RW (downgraded basic transfer goals to S for this reason), but wife able to give appropriate cueing at needed. Also discussed ways and body mechanics to provide assist for transfers if needed upon d/c as well as DME and follow up equipment recommended.  Therapy Documentation Precautions:  Precautions Precautions: Fall Precaution Comments: ileostomy, abdominal incisional wound Restrictions Weight Bearing Restrictions: No Pain:  Denies pain   Locomotion : Ambulation Ambulation/Gait Assistance: 5: Supervision   See FIM for current functional status  Therapy/Group: Individual Therapy  Karolee Stamps [facility] 06/24/2012, 10:28 AM

## 2012-06-24 NOTE — Plan of Care (Signed)
Problem: RH SKIN INTEGRITY Goal: RH STG SKIN FREE OF INFECTION/BREAKDOWN Outcome: Not Progressing Currently being treated for yeasty rash to body.

## 2012-06-24 NOTE — Progress Notes (Signed)
Occupational Therapy Session Note  Patient Details  Name: KIRIN PASTORINO MRN: [id number] Date of Birth: 1930/04/24  Today's Date: 06/24/2012 Time: 1400-1430 Time Calculation (min): 30 min  Short Term Goals: Week 1:  OT Short Term Goal 1 (Week 1): STGs equal LTGs secondary to ELOS.  Overall modified independent level expected.  Skilled Therapeutic Interventions/Progress Updates:    1:1 Education and review of emptying ostomy bag; how to perform sitting on toilet versus emptying into container, management of ostomy bag with daily routine. Continued work on functional ambulation and activity tolerance with going down to gift shop. Pt did require closer supervision with turning - pt difficulty with remaining inside of walker with turns, but was able to self monitor he needed to slow down.   Therapy Documentation Precautions:  Precautions Precautions: Fall Precaution Comments: ileostomy, abdominal incisional wound Restrictions Weight Bearing Restrictions: No Pain: Pain Assessment Pain Assessment: No/denies pain  See FIM for current functional status  Therapy/Group: Individual Therapy  Roney Mans [facility] 06/24/2012, 2:41 PM

## 2012-06-25 ENCOUNTER — Inpatient Hospital Stay (HOSPITAL_COMMUNITY): Payer: Medicare Other | Admitting: Physical Therapy

## 2012-06-25 ENCOUNTER — Encounter (HOSPITAL_COMMUNITY): Payer: Medicare Other | Admitting: Occupational Therapy

## 2012-06-25 ENCOUNTER — Inpatient Hospital Stay (HOSPITAL_COMMUNITY): Payer: Medicare Other

## 2012-06-25 ENCOUNTER — Telehealth: Payer: Self-pay | Admitting: Oncology

## 2012-06-25 LAB — BASIC METABOLIC PANEL
BUN: 16 mg/dL (ref 6–23)
CO2: 27 mEq/L (ref 19–32)
Glucose, Bld: 120 mg/dL — ABNORMAL HIGH (ref 70–99)
Potassium: 4.2 mEq/L (ref 3.5–5.1)
Sodium: 137 mEq/L (ref 135–145)

## 2012-06-25 LAB — CBC
HCT: 28.8 % — ABNORMAL LOW (ref 39.0–52.0)
Hemoglobin: 10.1 g/dL — ABNORMAL LOW (ref 13.0–17.0)
MCH: 32.6 pg (ref 26.0–34.0)
RBC: 3.1 MIL/uL — ABNORMAL LOW (ref 4.22–5.81)

## 2012-06-25 NOTE — Progress Notes (Signed)
Physical Therapy Session Note  Patient Details  Name: [name] [name] MRN: [id number] Date of Birth: 1931-01-05  Today's Date: 06/25/2012 Time: 4098-1191 Time Calculation (min): 45 min  Short Term Goals: Week 1:  PT Short Term Goal 1 (Week 1): = LTGs  Skilled Therapeutic Interventions/Progress Updates:   Pt up in recliner, reports he is feeling better since this morning with BP. Discussed what to do at home in case he has dizziness or low BP and pt able to return verbalize appropriate response. Attempted to gait with RW out of room but pt became orthostatic and requested to sit down; BP taken (see doc flow sheets). W/c mobility for endurance and strengthening once symptoms resolved. Attempted standing again but pt became symptomatic and BP dropped. RN notified and pt returned to room. Worked on transfer in the room without becoming symptomatic with S and bed mobility monitoring BP in supine, sitting EOB, and standing again. Returned to supine and completed SLR, bridging and trunk rotation as well as UE resistance band exercise with 10 reps each exercise.   Therapy Documentation Precautions:  Precautions Precautions: Fall Precaution Comments: ileostomy, abdominal incisional wound Restrictions Weight Bearing Restrictions: No Vital Signs: (see Doc Flow Sheets for complete list of vitals) Pain:  No complaints.   See FIM for current functional status  Therapy/Group: Individual Therapy  Karolee Stamps [facility] 06/25/2012, 4:14 PM

## 2012-06-25 NOTE — Telephone Encounter (Signed)
C/D 06/25/12 for appt. 07/04/12

## 2012-06-25 NOTE — Progress Notes (Signed)
Pt walking with therapy blood pressure dropped to 84/48-68 pt complained of dizziness. Pt placed back in bed encouraged to drink fluids, Dr.Swartz made aware

## 2012-06-25 NOTE — Progress Notes (Signed)
Physical Therapy Note  Patient Details  Name: [name] [name] MRN: [id number] Date of Birth: 11/19/1930 Today's Date: 06/25/2012  Time: 1100-1134 34 minutes  No c/o pain.  Pt reports he feels less dizzy.  BP 120/73, RN aware, OK for out of bed activities.  Nu step x 10 minutes level 4 for UE/LE strength and endurance.  Attempt standing therex, pt reports dizziness after 90 seconds on feet, resolved with seated rest.  Seated B LE therex 3# wt each LE.  Pt with good participation, no further c/o dizziness.  Individual therapy   Demetrica Zipp 06/25/2012, 11:34 AM

## 2012-06-25 NOTE — Progress Notes (Signed)
Pt denies dizziness, VS-120/73-78

## 2012-06-25 NOTE — Progress Notes (Addendum)
NUTRITION FOLLOW UP  DOCUMENTATION CODES  Per approved criteria   -Not Applicable    Intervention:   1. Continue [employer] daily. RD placed request in [employer].  2. Please obtain new wt 3. RD to continue to follow nutrition care plan  Nutrition Dx:   Increased nutrient needs related to healing, increased energy expenditure, wt loss as evidenced by pt s/p abdominal surgery, participating in intensive rehab program, wt loss PTA reported by wife. Ongoing.  Goal/Monitor: 1. Food/Beverage; intake with meal and supplements sufficient to meet >/=90% estimated needs  2. Wt/wt change; monitor trend, deter loss  3. Knowledge; monitor for questions related to nutrition therapy/diet.  Assessment:   Pt admitted to CIR from [facility] s/p ex lap with ileostomy for metastatic gastric cancer. Pt known to clinical nutrition staff during inpatient admission. Pt with difficulty eating and slow diet advancement, eventually able to achieve Regular diet with improved intake at 50-75% of meals. Pt was taking Ensure Complete BID with encouragement from family.   Continues with order for Regular diet. Meal intake is approximately 50-85%. Continues with snack of [employer].  Wife reports that meal intake is improving and he enjoys [employer].  Height: Ht Readings from Last 1 Encounters:  06/18/12 6\' 1"  (1.854 m)    Weight Status:   Wt Readings from Last 1 Encounters:  06/18/12 179 lb 14.3 oz (81.6 kg)  No new weight available.  Re-estimated needs:  Kcal: 2050 - 2280 Protein: 105 - 120 grams Fluid: at least 2 liters  Skin: incision, otherwise intact  Diet Order: General   Intake/Output Summary (Last 24 hours) at 06/25/12 1057 Last data filed at 06/25/12 0900  Gross per 24 hour  Intake    720 ml  Output   2345 ml  Net  -1625 ml    Last BM: 4/7 (diarrhea)   Labs:   Recent Labs Lab 06/19/12 0720 06/24/12 0540 06/25/12 0924  NA 131*  136 137  K 3.9 4.1 4.2  CL 95* 100 102  CO2 28 29 27   BUN 15 17 16   CREATININE 0.87 1.07 1.12  CALCIUM 9.5 9.6 9.6  GLUCOSE 107* 97 120*    CBG (last 3)  No results found for this basename: GLUCAP,  in the last 72 hours  Scheduled Meds: . diltiazem  120 mg Oral Daily  . ferrous sulfate  325 mg Oral BID WC  . nitrofurantoin (macrocrystal-monohydrate)  100 mg Oral Q12H  . nystatin cream   Topical BID  . pantoprazole  40 mg Oral BID AC  . QUEtiapine  25 mg Oral QHS  . rivaroxaban  20 mg Oral Q supper  . tamsulosin  0.4 mg Oral QPC supper  . vitamin C  500 mg Oral BID    Continuous Infusions:  none  Jarold Motto MS, RD, LDN Pager: [phone] After-hours pager: [phone]

## 2012-06-25 NOTE — Consult Note (Signed)
06/24/12  PSYCHOSOCIAL NOTE - CONFIDENTIAL  [facility]   [name] was seen on the [facility] Unit.  Records indicated that he was admitted to the rehab unit owing to functional deficits secondary to metastatic gastric cancer. A neuropsychology consult was ordered to assess his current emotional status subsequent to his present medical situation and to assist with treatment planning.  [name] presented as a very bright and vibrant gentlemen who acknowledged experiencing a significant degree of difficulty adjusting to his present situation. He was reportedly diagnosed with a terminal illness and admittedly has not begun to "deal with" the situation. He has also started having chaotic nightmares and is thrashing around in his sleep. Upon awakening, he realizes that he has also urinated on himself. This is quite embarrassing for him.   These dreams of being out of control may very well represent suppressed emotions about [name] present situation, and these symptoms have particularly worsened since his wife stopped staying overnight with him. Thankfully, she will be returning this evening. In addition, after trying a "sleep aid" to no avail, his medical doctor plans to try a "sedative" tonight to see if this will help ameliorate some of his symptoms.   [name] was very enthusiastic about the prospect of being followed by neuropsychology. However, he was not sure whether he would still be on the unit next week.   Assessment: The patient is in an unfortunate state at this time. It is understandable that he is utilizing a healthy dose of denial about his present situation. However, he needs to be prepared for when his defenses break down and the reality of the situation sinks in. As such, we will provide him with the best coping mechanisms we can while he is on the unit, as well as provide resources he can utilize upon discharge.    Plan:    Schedule him for follow-up coping and ego support for next Monday   Provide supportive psychotherapy in the interim, as needed.   Greater than 50% of this visit was spent educating the patient about the possible diagnosis, prognosis, management plan, and in coordination of care.   REFERRING DIAGNOSIS: Gastric adenocarcinoma  FINAL DIAGNOSES:  Gastric adenocarcinoma Adjustment disorder with anxiety (moderate)   Total professional time including medical record review and feedback equals 4 units (16109).   Debbe Mounts, Psy.D.  Clinical Neuropsychologist

## 2012-06-25 NOTE — Progress Notes (Signed)
Physical Therapy Session Note  Patient Details  Name: [name] [name] MRN: [id number] Date of Birth: 10-19-1930  Today's Date: 06/25/2012 Time: 0830-0925 Time Calculation (min): 55 min  Short Term Goals: Week 1:  PT Short Term Goal 1 (Week 1): = LTGs  Skilled Therapeutic Interventions/Progress Updates:   Gait down to therapy gym for general endurance and strengthening with RW with S. Dynamic gait and balance activity through obstacle course while scanning for horseshoes and collecting them with overall S; cues for safe use of RW when needing to side step. Dynamic standing balance and cognitive task while throwing and catching ball with wife and identifying a food item that started with a particular. Pt became "woozy" feeling and needed to sit down. BP taken in sitting and standing positions and notified RN of low BP. Pt in supine when RN returned and manual BP taken (still low). Per PA pt brought back to room by w/c and completed supine therex including heel slides, ankle pumps, SLR, and bridging x 2 sets of 10 reps each.  Therapy Documentation Precautions:  Precautions Precautions: Fall Precaution Comments: ileostomy, abdominal incisional wound Restrictions Weight Bearing Restrictions: No   Vital Signs: Therapy Vitals Pulse Rate: 94 BP: 92/58 mmHg Patient Position, if appropriate: Sitting  (see doc flow sheets for all readings; BP = 80/50 with HR = 105 in standing) Pain:  Denies pain.  See FIM for current functional status  Therapy/Group: Individual Therapy  Karolee Stamps [facility] 06/25/2012, 9:29 AM

## 2012-06-25 NOTE — Progress Notes (Signed)
Patient ID: [name] [name], male   DOB: 14-May-1930, 79 y.o.   MRN: [id number] Subjective/Complaints: Had a much better night. Wife stayed with him. Still emptied his bladder 6 times. A 12 point review of systems has been performed and if not noted above is otherwise negative.   Objective: Vital Signs: Blood pressure 113/70, pulse 64, temperature 98.2 F (36.8 C), temperature source Oral, resp. rate 18, height 6\' 1"  (1.854 m), weight 81.6 kg (179 lb 14.3 oz), SpO2 95.00%. Dg Chest 2 View  06/24/2012  *RADIOLOGY REPORT*  Clinical Data: Left lower lobe pneumonia.  CHEST - 2 VIEW  Comparison: 03/31 and 04/17/2012  Findings: There has been complete resolution of the infiltrate at the left lung base.  Lungs are clear.  Heart size and vascularity are normal.  No acute osseous abnormality.  IMPRESSION: Complete clearing of the left lower lobe pneumonia.   Original Report Authenticated By: Francene Boyers, M.D.    No results found for this basename: WBC, HGB, HCT, PLT,  in the last 72 hours  Recent Labs  06/24/12 0540  NA 136  K 4.1  CL 100  GLUCOSE 97  BUN 17  CREATININE 1.07  CALCIUM 9.6   CBG (last 3)  No results found for this basename: GLUCAP,  in the last 72 hours  Wt Readings from Last 3 Encounters:  06/18/12 81.6 kg (179 lb 14.3 oz)  06/13/12 88.4 kg (194 lb 14.2 oz)  06/13/12 88.4 kg (194 lb 14.2 oz)    Physical Exam:  General: Alert and oriented x 3, appears a little fatigued. No distress.  HEENT: Head is normocephalic, atraumatic, PERRLA, EOMI, sclera anicteric, oral mucosa pink and moist, dentition intact, ext ear canals clear,  Neck: Supple without JVD or lymphadenopathy  Heart: Reg rate and rhythm. No murmurs rubs or gallops  Chest: decreased breath sounds at bases. no wheezes, rales, or rhonchi; no distress  Abdomen: Soft, non-tender, non-distended, bowel sounds positive. Tender around ostomy site, some irritation around the inferior border, appears fungal.   Extremities: No clubbing, cyanosis, or edema. Pulses are 2+  Skin: wounds clean and well approximated. Ostomy well adhered.   Psych: Pt's affect is appropriate. Pt is cooperative. Conversationally fairly appropriate.  Assessment/Plan: 1. Functional deficits secondary to metastatic gastric cancer and ex lap. Post-op encephalopathy which require 3+ hours per day of interdisciplinary therapy in a comprehensive inpatient rehab setting. Physiatrist is providing close team supervision and 24 hour management of active medical problems listed below. Physiatrist and rehab team continue to assess barriers to discharge/monitor patient progress toward functional and medical goals. FIM: FIM - Bathing Bathing Steps Patient Completed: Chest;Right Arm;Left Arm;Abdomen;Front perineal area;Buttocks;Right upper leg;Left upper leg;Right lower leg (including foot);Left lower leg (including foot) Bathing: 5: Supervision: Safety issues/verbal cues  FIM - Upper Body Dressing/Undressing Upper body dressing/undressing steps patient completed: Thread/unthread right sleeve of pullover shirt/dresss;Thread/unthread left sleeve of pullover shirt/dress;Put head through opening of pull over shirt/dress;Pull shirt over trunk Upper body dressing/undressing: 5: Supervision: Safety issues/verbal cues FIM - Lower Body Dressing/Undressing Lower body dressing/undressing steps patient completed: Thread/unthread right underwear leg;Thread/unthread left underwear leg;Pull underwear up/down;Thread/unthread right pants leg;Thread/unthread left pants leg;Pull pants up/down;Fasten/unfasten pants;Don/Doff right sock;Don/Doff left sock Lower body dressing/undressing: 5: Supervision: Safety issues/verbal cues  FIM - Toileting Toileting: 0: Activity did not occur  FIM - Diplomatic Services operational officer Devices: Grab bars;Walker Toilet Transfers: 0-Activity did not occur  FIM - Banker  Devices: Arm rests;Walker;Bed rails Bed/Chair Transfer: 6:  Supine > Sit: No assist;5: Bed > Chair or W/C: Supervision (verbal cues/safety issues)  FIM - Locomotion: Wheelchair Locomotion: Wheelchair: 0: Activity did not occur FIM - Locomotion: Ambulation Locomotion: Ambulation Assistive Devices: Designer, industrial/product Ambulation/Gait Assistance: 5: Supervision Locomotion: Ambulation: 5: Travels 150 ft or more with supervision/safety issues  Comprehension Comprehension Mode: Auditory Comprehension: 5-Understands complex 90% of the time/Cues < 10% of the time  Expression Expression Mode: Verbal Expression: 5-Expresses complex 90% of the time/cues < 10% of the time  Social Interaction Social Interaction: 4-Interacts appropriately 75 - 89% of the time - Needs redirection for appropriate language or to initiate interaction.  Problem Solving Problem Solving: 3-Solves basic 50 - 74% of the time/requires cueing 25 - 49% of the time  Memory Memory: 5-Recognizes or recalls 90% of the time/requires cueing < 10% of the time  Medical Problem List and Plan:  1. DVT Prophylaxis/Anticoagulation: Pharmaceutical: Xarelto  2. Pain Management: Limit narcotics due to sedative SE. Unable to tolerate oxycodone or morphine per reports. Will use tylenol prn and monitor for complaints.  3. Mood: seems to be appropriate. Wife and patient (to an extent aware of diagnosis) aware of diagnosis. Will have LCSW follow for support.  4. Neuropsych: This patient is generally capable of making decisions on his/her own behalf.  5. Hyponatremia: follow up labs 6. CAF: Monitor HR with bid check. Continue Cardizem and Xarelto.  7. HTN: Will monitor with bid checks.  8. 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 Dysarthria fluctuates depending on fatigue.  9. GIB/ABLA: Hgb stable on pradaxa. Will recheck in am and monitor for signs of bleeding.  10. LLL PNA: dc levaquin, recheck cxr. 11. Stage IV metastatic AdenoCA of stomach: to follow  up with Dr. Truett Perna post discharge  12. Sundowning: continued problems over the weekend  -rx UTI  -added low dose seroquel which helped (in addition to wife being in room)  -had been doing much better when wife was present  -will discuss with his wife as well. 13. Bladder. Timed voids. Fluid rationing at night  -culture positive for coag neg staph.  macrobid for 5 days given options  -continue flomax  -was continent last night  LOS (Days) 7 A FACE TO FACE EVALUATION WAS PERFORMED  SWARTZ,ZACHARY T 06/25/2012 7:48 AM

## 2012-06-25 NOTE — Progress Notes (Addendum)
Occupational Therapy Session Note  Patient Details  Name: [name] [name] MRN: [id number] Date of Birth: 06-May-1930  Today's Date: 06/25/2012 Time: 0932-1026 Time Calculation (min): 54 min  Skilled Therapeutic Interventions/Progress Updates:    Pt seen for individual OT treatment session with focus on ADL retraining (@ bed level & sitting EOB due to low BP earlier in PT) as well as UB strengthening supine in bed w/ yellow/level 1 theraband. Pt resting BP prior to treatment 124/75 99% O2 HR 70. After bathe and dressing EOB & sit to stand after LB ADL's, pt BP123/79 96% O2 HR 76. Pt did not report any symptoms as he had experienced earlier in PT and was able to complete bilateral UB strengthening ex's in supine for bilateral shoulder horizontal ABD, shoulder flexion, triceps & biceps x2 sets 10 reps ex w/ Mod VC's for positioning. BP after ther ex 120/77 98% pulse 69. Pt left in bed w/ call bell, phone and bed alarm on.  Therapy Documentation Precautions:  Precautions Precautions: Fall Precaution Comments: ileostomy, abdominal incisional wound Restrictions Weight Bearing Restrictions: No   Vital Signs: Therapy Vitals: See treatment note Pain:  No, pt denies pain     See FIM for current functional status  Therapy/Group: Individual Therapy  Alm Bustard 06/25/2012, 10:33 AM

## 2012-06-25 NOTE — Progress Notes (Signed)
Called to gym by PT, pt c/o dizziness, orthostatic vital signs performed:sitting: 95/62: 68,standing: 80/50;105, 10 mins after lying(maual pressure) 96/60; 68. Pam Love PA notified, Cardizem held, pt put back to bed B/P after lying down in bed 98/60; 68. RN encouraged pt to drink fluids

## 2012-06-26 ENCOUNTER — Encounter (INDEPENDENT_AMBULATORY_CARE_PROVIDER_SITE_OTHER): Payer: Medicare Other | Admitting: General Surgery

## 2012-06-26 ENCOUNTER — Telehealth: Payer: Self-pay | Admitting: *Deleted

## 2012-06-26 ENCOUNTER — Encounter (HOSPITAL_COMMUNITY): Payer: Medicare Other | Admitting: Occupational Therapy

## 2012-06-26 ENCOUNTER — Ambulatory Visit: Payer: Medicare Other | Admitting: Cardiovascular Disease

## 2012-06-26 ENCOUNTER — Inpatient Hospital Stay (HOSPITAL_COMMUNITY): Payer: Medicare Other

## 2012-06-26 DIAGNOSIS — F411 Generalized anxiety disorder: Secondary | ICD-10-CM | POA: Diagnosis present

## 2012-06-26 DIAGNOSIS — A499 Bacterial infection, unspecified: Secondary | ICD-10-CM | POA: Diagnosis not present

## 2012-06-26 DIAGNOSIS — J189 Pneumonia, unspecified organism: Secondary | ICD-10-CM | POA: Diagnosis present

## 2012-06-26 MED ORDER — RIVAROXABAN 20 MG PO TABS: 20.0000 mg | ORAL_TABLET | Freq: Every day | ORAL | Status: DC

## 2012-06-26 MED ORDER — DILTIAZEM HCL ER COATED BEADS 120 MG PO CP24: 120.0000 mg | ORAL_CAPSULE | ORAL | Status: DC

## 2012-06-26 MED ORDER — FERROUS SULFATE 325 (65 FE) MG PO TABS: 325.0000 mg | ORAL_TABLET | Freq: Two times a day (BID) | ORAL | Status: DC

## 2012-06-26 MED ORDER — NITROFURANTOIN MONOHYD MACRO 100 MG PO CAPS: 100.0000 mg | ORAL_CAPSULE | Freq: Two times a day (BID) | ORAL | Status: DC

## 2012-06-26 MED ORDER — ASCORBIC ACID 500 MG PO TABS: 500.0000 mg | ORAL_TABLET | Freq: Two times a day (BID) | ORAL | Status: DC

## 2012-06-26 MED ORDER — QUETIAPINE FUMARATE 25 MG PO TABS: 25.0000 mg | ORAL_TABLET | Freq: Every day | ORAL | Status: DC

## 2012-06-26 MED ORDER — ACETAMINOPHEN 325 MG PO TABS: 325.0000 mg | ORAL_TABLET | Freq: Four times a day (QID) | ORAL | Status: DC | PRN

## 2012-06-26 NOTE — Telephone Encounter (Signed)
Spoke with patient's wife, nancy, by phone and confirmed appointment with Dr. Truett Perna for 07/04/12.  Contact names and phone numbers were provided.

## 2012-06-26 NOTE — Progress Notes (Signed)
Social Work Patient ID: [name] [name], male   DOB: 1930-10-06, 79 y.o.   MRN: [id number]   Met yesterday afternoon with patient and wife - both pleased and agreeable with planned d/c today.  Addressed questions about follow up and DME.  Wife has completed family ed.    Jamison Soward, LCSW

## 2012-06-26 NOTE — Progress Notes (Signed)
[facility]  Patient Status: New  [facility] is providing the following services: RN and PT  If patient discharges after hours, please call [phone].   [name] [name] 06/26/2012, 9:26 AM

## 2012-06-26 NOTE — Progress Notes (Signed)
Social Work  Discharge Note  The overall goal for the admission was met for:   Discharge location: Yes - home with wife who can provide 24/7 supervision  Length of Stay: Yes - 8 days  Discharge activity level: Yes  - supervision to modified independent  Home/community participation: Yes  Services provided included: MD, RD, PT, OT, RN, TR, Pharmacy, Neuropsych and SW  Financial Services: Medicare  (* Blue Medicare)  Follow-up services arranged: Home Health: RN, PT via [facility], DME: 18x18 Breezy w/c, cushion, rolling walker and tub seat - [facility] and Patient/Family has no preference for HH/DME agencies  Comments (or additional information): Provided information on support resources at [facility]  Patient/Family verbalized understanding of follow-up arrangements: Yes  Individual responsible for coordination of the follow-up plan: patient  Confirmed correct DME delivered: Jazelyn Sipe 06/26/2012    Alain Deschene

## 2012-06-26 NOTE — Progress Notes (Signed)
Pt discharged home with wife, Marissa Nestle PA provided discharge instructions and prescriptions, pt and wife verbalized an understanding, Melody WOC saw pt before he left and provided ostomy teaching, pt discharged via wheelchair to private vehicle

## 2012-06-26 NOTE — Progress Notes (Signed)
Occupational Therapy Session Note  Patient Details  Name: [name] [name] MRN: [id number] Date of Birth: [birth year]  Today's Date: 06/26/2012 Time: 0728-0825 Time Calculation (min): 57 min  Skilled Therapeutic Interventions/Progress Updates:    Tami Ribas Day! Pt seen for individual OT treatment session with his wife present throughout for pt/family education, ADL retraining at shower level, monitoring BP etc. Pt resting BP 132/80, after shower (ambulating into BR w/ RW @ supervision level) BP 126/76 seated on shower chair. After completion of ADL's (including sit-stand at sink for grooming and dressing) BP 111/70. Pt seated in chair at conclusion of session and pt/wife report that they do not have any further questions/concerns re:OT & ADL's. Family was educated in energy conservation tech's, taking rest breaks w/ fluctuating BP & shower chair/grab bar in shower at home as well as HHOT recommended for home safety assessment. Pt's wife completed emptying colostomy bag independently.  Therapy Documentation Precautions:  Precautions Precautions: Fall Precaution Comments: ileostomy, abdominal incisional wound Restrictions Weight Bearing Restrictions: No   Vital Signs: BP Resting = 132/80 BP after shower level ADL= 126/76 BP after all ADL's for bathing and dressing & sit-stand at sink = 111/70 RN made aware of all of the above. Pain:  No, denies pain ADL: ADL Eating: Set up Where Assessed-Eating: Chair Grooming: Setup Where Assessed-Grooming: Chair;Sitting at sink Upper Body Bathing: Supervision/safety Where Assessed-Upper Body Bathing: Chair Lower Body Bathing: Minimal assistance Where Assessed-Lower Body Bathing: Shower;Standing at sink;Sitting at sink Upper Body Dressing: Supervision/safety Where Assessed-Upper Body Dressing: Chair;Sitting at sink Lower Body Dressing: Minimal assistance Where Assessed-Lower Body Dressing: Chair Toileting: Minimal assistance Where  Assessed-Toileting: Teacher, adult education: Curator Method: Proofreader: Chiropractor Transfer: Minimal Radiation protection practitioner Method: Ship broker: Grab bars;Shower seat with back Film/video editor: Minimal TEFL teacher Method: Designer, industrial/product: Shower seat with back;Grab bars     See FIM for current functional status  Therapy/Group: Individual Therapy  Alm Bustard 06/26/2012, 8:28 AM

## 2012-06-26 NOTE — Patient Care Conference (Signed)
Inpatient RehabilitationTeam Conference and Plan of Care Update Date: 06/25/2012   Time: 2:25 PM    Patient Name: [name] [name]      Medical Record Number: [id number]  Date of Birth: 02-18-1931 Sex: Male         Room/Bed: 4011/4011-01 Payor Info: Payor: [employer] [place] [employer]ICARE  Plan: BLUE MEDICARE  Product Type: *No Product type*     Admitting Diagnosis: Deconditioned met gastric CA  Encephalopathy  Admit Date/Time:  06/18/2012  3:04 PM Admission Comments: No comment available   Primary Diagnosis:  Encephalopathy Principal Problem: Encephalopathy  Patient Active Problem List   Diagnosis Date Noted  . Adjustment disorder with anxiety 06/26/2012  . LLL PNA (pneumonia) 06/26/2012  . Staph UTI (urinary tract infection) 06/26/2012  . Encephalopathy with sundowning. 06/18/2012  . Anemia of chronic disease 06/17/2012  . Ileostomy in place 06/14/2012  . Gastric adenocarcinoma 06/11/2012  . Neck stiffness 06/09/2012  . Hypokalemia 06/07/2012  . Weakness generalized 06/07/2012  . Pain, abdominal, generalized 06/07/2012  . Diastolic CHF, acute 06/05/2012  . Acute diastolic heart failure 06/05/2012  . Acute respiratory distress 06/05/2012  . Acute encephalopathy 06/05/2012  . Dysarthria, acute 06/05/2012  . Paroxysmal a-fib/flutter 06/05/2012  . Atrial fibrillation, rapid 05/30/2012  . GI bleed 05/30/2012  . Abdominal distention 05/30/2012  . Syncope 05/30/2012  . SBO (small bowel obstruction) 05/30/2012  . TIA (transient ischemic attack) 04/17/2012  . Atrial fibrillation 04/17/2012  . Gastroparesis 04/17/2012  . CVA (cerebral infarction) 03/12/2011  . HTN (hypertension), malignant 03/12/2011  . Chest pain 07/13/2010  . Left knee pain 06/14/2010  . Knee effusion, left 06/14/2010  . SCIATICA, RIGHT 10/06/2009  . NUMBNESS 08/25/2009  . METATARSALGIA 08/30/2006  . NEUROPATHY, IDIOPATHIC PERIPHERAL 08/02/2006  . ERECTILE DYSFUNCTION 05/17/2006  . TOBACCO DEPENDENCE  05/17/2006  . HYPERTENSION, BENIGN SYSTEMIC 05/17/2006  . DIVERTICULOSIS OF COLON 05/17/2006  . BPH 05/17/2006    Expected Discharge Date: Expected Discharge Date: 06/26/12  Team Members Present: Physician leading conference: Dr. Faith Rogue Social Worker Present: Amada Jupiter, LCSW Nurse Present: Rosalio Macadamia, RN PT Present: Karolee Stamps, PT;Other (comment) Sherrine Maples, PT) OT Present: Mackie Pai, OT SLP Present: Feliberto Gottron, SLP Other (Discipline and Name): Tora Duck, PPS Coordinator and Bayard Hugger, RN     Current Status/Progress Goal Weekly Team Focus  Medical   metastatic gastric ca with severe deconditioing after ex lap. sundowning, urinary tract infrection  improve sleep, stabilize medically for dc  rx uti and frequency, improve sleep, prepare skin/ostomy care for dc   Bowel/Bladder   Pt is continent of urine; ileostomy  Pt will be able to care for ileostomy  Continued Teaching for patient and wife   Swallow/Nutrition/ Hydration             ADL's   S level ADL's while seated EOB & sit0stand (secondary to low BP)  Mod I-Supervision level overall  d/c planning, pt/family education, ADL retraining   Mobility   S overall with gait and transfers; min A stairs  S overall  d/c planning, family education, dynamic balance and gait, endurance   Communication             Safety/Cognition/ Behavioral Observations            Pain   N/A         Skin   Sacrum is red and healing  Heal red areas and prevenet further breakdown to sacrum  Continue to monitor  Rehab Goals Patient on target to meet rehab goals: Yes *See Care Plan and progress notes for long and short-term goals.  Barriers to Discharge: safety, skin care issues    Possible Resolutions to Barriers:  family education, normalize sleep cylcle    Discharge Planning/Teaching Needs:  Home with wife and other family available to provide 24/7 supervision      Team Discussion:  Reaching supervision  goals. Still with some confusion at night, however, better if family stays.  No other concerns  Revisions to Treatment Plan:  None   Continued Need for Acute Rehabilitation Level of Care: The patient requires daily medical management by a physician with specialized training in physical medicine and rehabilitation for the following conditions: Daily direction of a multidisciplinary physical rehabilitation program to ensure safe treatment while eliciting the highest outcome that is of practical value to the patient.: Yes Daily medical management of patient stability for increased activity during participation in an intensive rehabilitation regime.: Yes Daily analysis of laboratory values and/or radiology reports with any subsequent need for medication adjustment of medical intervention for : Neurological problems;Post surgical problems;Other (cancer)  Lawerance Matsuo 06/26/2012, 9:46 AM

## 2012-06-26 NOTE — Discharge Summary (Signed)
Physician Discharge Summary  Patient ID: [name] [name] MRN: [id number] DOB/AGE: 04-21-1930 79 y.o.  Admit date: 06/18/2012 Discharge date: 06/26/2012  Discharge Diagnoses:  Principal Problem:   Encephalopathy with sundowning. Active Problems:   CVA (cerebral infarction)   HTN (hypertension), malignant   Atrial fibrillation   Weakness generalized   Gastric adenocarcinoma   Adjustment disorder with anxiety   LLL PNA (pneumonia)   Staph UTI (urinary tract infection)   Discharged Condition: Good  Significant Diagnostic Studies:  Dg Chest 2 View  06/24/2012  *RADIOLOGY REPORT*  Clinical Data: Left lower lobe pneumonia.  CHEST - 2 VIEW  Comparison: 03/31 and 04/17/2012  Findings: There has been complete resolution of the infiltrate at the left lung base.  Lungs are clear.  Heart size and vascularity are normal.  No acute osseous abnormality.  IMPRESSION: Complete clearing of the left lower lobe pneumonia.   Original Report Authenticated By: Francene Boyers, M.D.      Labs:  Basic Metabolic Panel:  Recent Labs Lab 06/24/12 0540 06/25/12 0924  NA 136 137  K 4.1 4.2  CL 100 102  CO2 29 27  GLUCOSE 97 120*  BUN 17 16  CREATININE 1.07 1.12  CALCIUM 9.6 9.6    CBC:  Recent Labs Lab 06/25/12 0924  WBC 5.7  HGB 10.1*  HCT 28.8*  MCV 92.9  PLT 325    CBG: No results found for this basename: GLUCAP,  in the last 168 hours  HPI:   [name] [name] is a 79 y.o. male with h/o CVA 12/12 and recently 1/14, CAF on pradaxa, h/o of gastritis who was admitted on 05/30/12 with A Fib with RVR and GIB. Work up indicated gastric cancer with mets to colon and patient underwent exp lap with diverting loop ileostomy on 06/13/12 by Dr. Janee Morn. Peritoneal fluid positive for Adeno CA.  Post op changed to xarelto to decrease risk of GIB. Hospital course complicated by encephalopathy with fluctuating bouts of lethargy as well as sundowning and LLL PNA. He was evaluated by therapy team and  CIR recommended for progression.   Hospital Course: [name] [name] was admitted to rehab 06/18/2012 for inpatient therapies to consist of PT, ST and OT at least three hours five days a week. Past admission physiatrist, therapy team and rehab RN have worked together to provide customized collaborative inpatient rehab. He completed 10 day course of po Levaquin. Follow up CXR showed resolution of PNA.  H/H has been stable on xarelto. Hyponatremia has resolved.   He continued to have problems with insomnia and sundowning exacerbated by frequency. UCS revealed staph UTI and he was started on Macrobid for treatment. He was orthostatic due to Flomax therefore this was discontinued.   Dr. MCDermot was consulted for input on mood and anxiety. He felt that patient had adjustment disorder with anxiety due to his current situation.  He was started on Seroquel with resolution of symptoms.  WOC has followed up for ileostomy education with patient and wife. Barrier ring was removed prior to discharge. Midline incision has healed well without signs or symptoms of infection. Blood pressure and heart rate has been controlled on Cardizem.  He has made good progress during his stay and has been set up for follow up HHPT and RN per [facility].   During patient's stay in rehab weekly team conferences were held to monitor patient's progress, set goals and discuss barriers to discharge. Speech therapy- Patient is at supervision level for ADLs.  He is showing   Improvement in activity tolerance, improved balance, increased strength and improved coordination. Patient is discharged ambulating at supervision level using RW. Family education was done with wife who will provide the necessary supervision/assistance past discharge      Disposition: 01-Home or Self Care       Future Appointments Provider Department Dept Phone   07/04/2012 2:00 PM [facility]  [phone]   07/04/2012 2:30 PM Ladene Artist, MD [facility] MEDICAL ONCOLOGY [phone]   09/25/2012 3:00 PM Micki Riley, MD [facility] [phone]       Medication List    STOP taking these medications       Fish Oil 1000 MG Caps      TAKE these medications       acetaminophen 325 MG tablet  Commonly known as:  TYLENOL  Take 1-2 tablets (325-650 mg total) by mouth every 6 (six) hours as needed.     ascorbic acid 500 MG tablet  Commonly known as:  VITAMIN C  Take 1 tablet (500 mg total) by mouth 2 (two) times daily.     b complex vitamins capsule  Take 1 capsule by mouth daily.     diltiazem 120 MG 24 hr capsule  Commonly known as:  CARDIZEM CD  Take 1 capsule (120 mg total) by mouth every morning. For atrial fibrillation     ferrous sulfate 325 (65 FE) MG tablet  Take 1 tablet (325 mg total) by mouth 2 (two) times daily with a meal.     nitrofurantoin (macrocrystal-monohydrate) 100 MG capsule  Commonly known as:  MACROBID  Take 1 capsule (100 mg total) by mouth every 12 (twelve) hours. For urinary tract inmf     omeprazole 20 MG capsule  Commonly known as:  PRILOSEC  Take 20 mg by mouth 2 (two) times daily.     PRESERVISION AREDS PO  Take 1 tablet by mouth 2 (two) times daily.     QUEtiapine 25 MG tablet  Commonly known as:  SEROQUEL  Take 1 tablet (25 mg total) by mouth at bedtime. For sleep/anxiety     Rivaroxaban 20 MG Tabs  Commonly known as:  XARELTO  Take 1 tablet (20 mg total) by mouth daily with supper. For atrial fibrillation     rosuvastatin 10 MG tablet  Commonly known as:  CRESTOR  Take 10 mg by mouth daily.     silodosin 8 MG Caps capsule  Commonly known as:  RAPAFLO  Take 8 mg by mouth daily with breakfast.     sucralfate 1 G tablet  Commonly known as:  CARAFATE  Take 1 g by mouth 3 (three) times daily before meals.     VITAMIN D-3 PO  Take 1 tablet by mouth daily.       Follow-up  Information   Follow up with Ranelle Oyster, MD. Call on 06/26/2012. (As needed)    Contact information:   [address], [address] [place] [address] [phone]       Follow up with Gates Rigg, MD On 09/25/2012. (appointment at 3 pm)    Contact information:   [address] [place] [address] [phone]       Follow up with Verne Carrow, MD. Call today. (for follow up on Atrial fibrillation)    Contact information:   [address] [place] [address] [phone]  Follow up with Liz Malady, MD. Call today. (for post op check. )    Contact information:   [address] [place] [address] [phone]       Follow up with Thornton Papas, MD On 07/04/2012. (appointment at 2 pm)    Contact information:   [address] [place] [address] [phone]       Signed: Jacquelynn Cree 06/26/2012, 12:54 PM

## 2012-06-26 NOTE — Progress Notes (Signed)
Patient ID: [name] [name], male   DOB: 04-21-1930, 79 y.o.   MRN: [id number] Subjective/Complaints: Had another good night. Feels ready to go home. Only voided once last night. A 12 point review of systems has been performed and if not noted above is otherwise negative.   Objective: Vital Signs: Blood pressure 119/70, pulse 70, temperature 97.8 F (36.6 C), temperature source Oral, resp. rate 18, height 6\' 1"  (1.854 m), weight 81.6 kg (179 lb 14.3 oz), SpO2 94.00%. Dg Chest 2 View  06/24/2012  *RADIOLOGY REPORT*  Clinical Data: Left lower lobe pneumonia.  CHEST - 2 VIEW  Comparison: 03/31 and 04/17/2012  Findings: There has been complete resolution of the infiltrate at the left lung base.  Lungs are clear.  Heart size and vascularity are normal.  No acute osseous abnormality.  IMPRESSION: Complete clearing of the left lower lobe pneumonia.   Original Report Authenticated By: Francene Boyers, M.D.     Recent Labs  06/25/12 0924  WBC 5.7  HGB 10.1*  HCT 28.8*  PLT 325    Recent Labs  06/24/12 0540 06/25/12 0924  NA 136 137  K 4.1 4.2  CL 100 102  GLUCOSE 97 120*  BUN 17 16  CREATININE 1.07 1.12  CALCIUM 9.6 9.6   CBG (last 3)  No results found for this basename: GLUCAP,  in the last 72 hours  Wt Readings from Last 3 Encounters:  06/18/12 81.6 kg (179 lb 14.3 oz)  06/13/12 88.4 kg (194 lb 14.2 oz)  06/13/12 88.4 kg (194 lb 14.2 oz)    Physical Exam:  General: Alert and oriented x 3, appears a little fatigued. No distress.  HEENT: Head is normocephalic, atraumatic, PERRLA, EOMI, sclera anicteric, oral mucosa pink and moist, dentition intact, ext ear canals clear,  Neck: Supple without JVD or lymphadenopathy  Heart: Reg rate and rhythm. No murmurs rubs or gallops  Chest: decreased breath sounds at bases. no wheezes, rales, or rhonchi; no distress  Abdomen: Soft, non-tender, non-distended, bowel sounds positive. Tender around ostomy site, some irritation around the  inferior border, appears fungal.  Extremities: No clubbing, cyanosis, or edema. Pulses are 2+  Skin: wounds clean and well approximated. Ostomy well adhered.   Psych: Pt's affect is appropriate. Pt is cooperative. Conversationally fairly appropriate.  Assessment/Plan: 1. Functional deficits secondary to metastatic gastric cancer and ex lap. Post-op encephalopathy which require 3+ hours per day of interdisciplinary therapy in a comprehensive inpatient rehab setting. Physiatrist is providing close team supervision and 24 hour management of active medical problems listed below. Physiatrist and rehab team continue to assess barriers to discharge/monitor patient progress toward functional and medical goals.  DC Home today. HH therapy FIM: FIM - Bathing Bathing Steps Patient Completed: Chest;Right Arm;Left Arm;Abdomen;Front perineal area;Buttocks;Right upper leg;Left upper leg (Sitting @ EOB due to low BP) Bathing: 5: Supervision: Safety issues/verbal cues (Sitting at EOB due to low BP earlier)  FIM - Upper Body Dressing/Undressing Upper body dressing/undressing steps patient completed: Thread/unthread right sleeve of pullover shirt/dresss;Thread/unthread left sleeve of pullover shirt/dress;Put head through opening of pull over shirt/dress;Pull shirt over trunk Upper body dressing/undressing: 5: Supervision: Safety issues/verbal cues FIM - Lower Body Dressing/Undressing Lower body dressing/undressing steps patient completed: Thread/unthread right underwear leg;Thread/unthread left underwear leg;Pull underwear up/down;Thread/unthread right pants leg;Thread/unthread left pants leg;Pull pants up/down;Fasten/unfasten pants;Don/Doff right sock;Don/Doff left sock Lower body dressing/undressing: 5: Supervision: Safety issues/verbal cues  FIM - Toileting Toileting: 0: Activity did not occur  FIM - Diplomatic Services operational officer  Devices: Grab bars;Walker [employer]ensions consultant Transfers: 0-Activity did  not occur  FIM - Midwife: Environmental consultant;Arm rests Bed/Chair Transfer: 6: Supine > Sit: No assist;6: Sit > Supine: No assist;5: Bed > Chair or W/C: Supervision (verbal cues/safety issues);5: Chair or W/C > Bed: Supervision (verbal cues/safety issues)  FIM - Locomotion: Wheelchair Locomotion: Wheelchair: 6: Travels 150 ft or more, turns around, maneuvers to table, bed or toilet, negotiates 3% grade: maneuvers on rugs and over door sills independently FIM - Locomotion: Ambulation Locomotion: Ambulation Assistive Devices: Designer, industrial/product Ambulation/Gait Assistance: 5: Supervision Locomotion: Ambulation: 5: Travels 150 ft or more with supervision/safety issues  Comprehension Comprehension Mode: Auditory Comprehension: 5-Understands complex 90% of the time/Cues < 10% of the time  Expression Expression Mode: Verbal Expression: 5-Expresses complex 90% of the time/cues < 10% of the time  Social Interaction Social Interaction: 4-Interacts appropriately 75 - 89% of the time - Needs redirection for appropriate language or to initiate interaction.  Problem Solving Problem Solving: 3-Solves basic 50 - 74% of the time/requires cueing 25 - 49% of the time  Memory Memory: 5-Recognizes or recalls 90% of the time/requires cueing < 10% of the time  Medical Problem List and Plan:  1. DVT Prophylaxis/Anticoagulation: Pharmaceutical: Xarelto  2. Pain Management: Limit narcotics due to sedative SE. Unable to tolerate oxycodone or morphine per reports. Will use tylenol prn and monitor for complaints.  3. Mood: seems to be appropriate. Wife and patient (to an extent aware of diagnosis) aware of diagnosis. Will have LCSW follow for support.  4. Neuropsych: This patient is generally capable of making decisions on his/her own behalf.  5. Hyponatremia: follow up labs 6. CAF: Monitor HR with bid check. Continue Cardizem and Xarelto.  7. HTN: Will monitor with bid  checks.  8. 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   9. GIB/ABLA: Hgb stable on pradaxa. Will recheck in am and monitor for signs of bleeding.  10. LLL PNA: dc levaquin, recheck cxr. 11. Stage IV metastatic AdenoCA of stomach: to follow up with Dr. Truett Perna post discharge  12. Sundowning: continued problems over the weekend  -rx UTI which has helped  -may continue scheduled seroquel for 2-3 nights once home then change to prn.  -i would expect him to be better in the familiar confines of his home 13. Bladder. Timed voids. Fluid rationing at night  -macrobid for a total of 5 days for UTI  -rapiflo at home    LOS (Days) 8 A FACE TO FACE EVALUATION WAS PERFORMED  Katsumi Wisler T 06/26/2012 7:59 AM

## 2012-06-26 NOTE — Progress Notes (Signed)
Physical Therapy Discharge Summary  Patient Details  Name: [name] [name] MRN: [id number] Date of Birth: Apr 24, 1930  Today's Date: 06/26/2012 Time: 4098-1191 Time Calculation (min): 53 min Individual therapy focused on graduation day activities including gait with and without AD, reviewing and practicing floor transfer in case of fall/emergency, balance activities, w/c mobility, stair negotiation and reviewing general safety recommendations. Pt overall S with all mobility; intermittent cues for safety needed at times. Wife and pt both state they feel prepared for d/c today. Pt was not orthostatic at all during session and completed session successfully. Nustep at end of session for endurance and strengthening on  Level 6 x 12 min.   Patient has met 10 of 10 long term goals due to improved activity tolerance, improved balance, increased strength and improved coordination.  Patient to discharge at an ambulatory level Supervision using RW.  Patient's care partner is independent to provide the necessary supervision assistance at discharge.  Reasons goals not met: n/a  Recommendation:  Patient will benefit from ongoing skilled PT services in home health setting to continue to advance safe functional mobility, address ongoing impairments in endurance, muscular endurance, strength, balance, gait, safety, and minimize fall risk.  Equipment: RW, 18x18 w/c with basic cushion  Reasons for discharge: treatment goals met and discharge from hospital  Patient/family agrees with progress made and goals achieved: Yes  PT Discharge Precautions/Restrictions Precautions Precautions: Fall Precaution Comments: ileostomy, abdominal incisional wound Vital Signs Therapy Vitals Pulse Rate: 70 BP: 111/70 mmHg Pain  Denies pain. Vision/Perception  Perception Perception: Within Functional Limits Praxis Praxis: Intact  Cognition Overall Cognitive Status: Appears within functional limits for tasks  assessed Memory Impairment: Decreased short term memory Safety/Judgment: Appears intact Sensation Sensation Light Touch: Appears Intact Proprioception: Appears Intact Coordination Gross Motor Movements are Fluid and Coordinated: Yes Motor  Motor Motor: Within Functional Limits  Locomotion  Ambulation Ambulation/Gait Assistance: 5: Supervision  Trunk/Postural Assessment  Cervical Assessment Cervical Assessment: Within Functional Limits Thoracic Assessment Thoracic Assessment: Within Functional Limits Thoracic Strength Overall Thoracic Strength Comments: thoracic kyphosis present Lumbar Assessment Lumbar Assessment: Within Functional Limits Postural Control Postural Control: Within Functional Limits  Balance Balance Balance Assessed: Yes Static Sitting Balance Static Sitting - Level of Assistance: 7: Independent Dynamic Sitting Balance Dynamic Sitting - Level of Assistance: 6: Modified independent (Device/Increase time) Static Standing Balance Static Standing - Level of Assistance: 6: Modified independent (Device/Increase time) Dynamic Standing Balance Dynamic Standing - Level of Assistance: 6: Modified independent (Device/Increase time) Extremity Assessment      RLE Assessment RLE Assessment:  (decreased muscular endurance; grossly 4+/5) LLE Assessment LLE Assessment:  (decreasaed muscular endurance; grossly 4+/5)  See FIM for current functional status  Karolee Stamps [facility] 06/26/2012, 9:26 AM

## 2012-06-26 NOTE — Consult Note (Signed)
WOC follow up Stoma type/location: RLQ, loop ileostomy  Stomal assessment/size: 1 3/4" vertically oval shaped Output liquid green Ostomy pouching: 2pc. 2 1/4" in place from pouch change yesterday when the support rod was removed Education provided:  Provided with several samples of 2pc. Pouches for use at home and 2" barrier rings. Marked [employer] catalog with items needed for ostomy care, as well as osteosecrets (customized undergarments for patients with ostomies).  Reviewed pouch change one additional time with wife. Wife independent with pouch change and empty of pouch.  Support group info left with wife also.    Dennie Moltz Kittredge RN,CWOCN [phone]

## 2012-06-26 NOTE — Progress Notes (Signed)
Occupational Therapy Discharge Summary  Patient Details  Name: [name] [name] MRN: [id number] Date of Birth: 1930-08-22  Today's Date: 06/26/2012 Time: 4132-4401 Time Calculation (min): 53 min  Today's Date: 06/26/2012  Time: 0728-0825  Time Calculation (min): 57 min   Skilled Therapeutic Interventions/Progress Updates:  Tami Ribas Day! Pt seen for individual OT treatment session with his wife present throughout for pt/family education, ADL retraining at shower level, monitoring BP etc. Pt resting BP 132/80, after shower (ambulating into BR w/ RW @ supervision level) BP 126/76 seated on shower chair. After completion of ADL's (including sit-stand at sink for grooming and dressing) BP 111/70. Pt seated in chair at conclusion of session and pt/wife report that they do not have any further questions/concerns re:OT & ADL's. Family was educated in energy conservation tech's, taking rest breaks w/ fluctuating BP & shower chair/grab bar in shower at home as well as HHOT recommended for home safety assessment. Pt's wife completed emptying colostomy bag independently. Pt/Wife expressed that they are ready for discharge.  Discharge Summary: Patient has met 9 of 9 long term goals due to improved activity tolerance, improved balance and postural control.  Patient to discharge at overall Supervision/PRN assist level.  Patient's care partner is independent to provide the necessary physical assistance at discharge.    Reasons goals not met: N/A  Recommendation:  Patient will benefit from ongoing skilled OT services in home health setting to continue to advance functional skills in the area of BADL, iADL and home safety assessment and recommendations.  Equipment: Pt/Wife were educated on recommendation of shower chair and grab bars in bathroom/shower as well as consideration of removing shower door.  Reasons for discharge: Treatment goals met/adequate for discharge.  Patient/family agrees with progress made  and goals achieved: Yes  OT Discharge Precautions/Restrictions  Precautions Precautions: Fall Precaution Comments: ileostomy, abdominal incisional wound Restrictions Weight Bearing Restrictions: No   Vital Signs See therapy note from today. Pt w/o orthostatic BP throughout session. Pain  No, pt denies pain ADL ADL Eating: Set up Where Assessed-Eating: Chair Grooming: Mod I Where Assessed-Grooming: Chair;Sitting at sink Upper Body Bathing: Mod I Where Assessed-Upper Body Bathing: Seated shower chair Lower Body Bathing: Mod I-supervision Where Assessed-Lower Body Bathing: Shower;Sitting, sit-stand w/ use of grab bar Upper Body Dressing: Independent Where Assessed-Upper Body Dressing: Chair;Sitting at sink Lower Body Dressing: Mod I Where Assessed-Lower Body Dressing: Chair/seated Toileting: Mod I Where Assessed-Toileting: Teacher, adult education: Mod I-supervision Statistician Method: Proofreader: Financial planner: Theme park manager Method: Ship broker: RW, Grab bars;Shower seat with back Film/video editor: Restaurant manager, fast food Method: Designer, industrial/product: Information systems manager with back;Grab bars Vision/Perception  Perception Perception: Within Financial controller Praxis: Intact  Cognition Overall Cognitive Status: Appears within functional limits for tasks assessed Orientation Level: Oriented X4 Memory Impairment: Decreased short term memory Safety/Judgment: Appears intact Sensation Sensation Light Touch: Appears Intact Proprioception: Appears Intact Coordination Gross Motor Movements are Fluid and Coordinated: Yes Motor  Motor Motor: Within Functional Limits Mobility    Mod I-supervision w/ RW, occasional VC's Trunk/Postural Assessment  Cervical Assessment Cervical Assessment: Within Functional Limits Thoracic Assessment Thoracic Assessment: Within  Functional Limits Thoracic Strength Overall Thoracic Strength Comments: thoracic kyphosis present Lumbar Assessment Lumbar Assessment: Within Functional Limits Postural Control Postural Control: Within Functional Limits  Balance Balance Balance Assessed: Yes Static Sitting Balance Static Sitting - Level of Assistance: 7: Independent Dynamic Sitting Balance Dynamic Sitting - Level of Assistance: 6: Modified independent (Device/Increase  time) Static Standing Balance Static Standing - Level of Assistance: 6: Modified independent (Device/Increase time) Dynamic Standing Balance Dynamic Standing - Level of Assistance: 6: Modified independent (Device/Increase time) Extremity/Trunk Assessment  Bilateral UE's WFL's for ADL/self care tasks. Grossly 4/5 thoughout   See FIM for current functional status  Alm Bustard 06/26/2012, 10:32 AM

## 2012-07-04 ENCOUNTER — Ambulatory Visit (HOSPITAL_BASED_OUTPATIENT_CLINIC_OR_DEPARTMENT_OTHER): Payer: Medicare Other | Admitting: Oncology

## 2012-07-04 ENCOUNTER — Ambulatory Visit: Payer: Medicare Other

## 2012-07-04 ENCOUNTER — Telehealth: Payer: Self-pay | Admitting: *Deleted

## 2012-07-04 ENCOUNTER — Telehealth: Payer: Self-pay | Admitting: Oncology

## 2012-07-04 VITALS — BP 99/63 | HR 71 | Temp 96.3°F | Resp 18 | Ht 73.0 in | Wt 169.3 lb

## 2012-07-04 DIAGNOSIS — C801 Malignant (primary) neoplasm, unspecified: Secondary | ICD-10-CM

## 2012-07-04 DIAGNOSIS — C169 Malignant neoplasm of stomach, unspecified: Secondary | ICD-10-CM

## 2012-07-04 NOTE — Telephone Encounter (Signed)
gv and printed appt schedule for pt for April and may with avs....michelle added tx and nut appt on 4.24.14

## 2012-07-04 NOTE — Telephone Encounter (Signed)
Per staff phone call and POF I have schedueld appts.  JMW  

## 2012-07-04 NOTE — Progress Notes (Signed)
[name] [name]    OFFICE PROGRESS NOTE   INTERVAL HISTORY:   [name] [name] returns as scheduled. I saw him in the hospital when he was diagnosed with metastatic gastric cancer. He completed a rehabilitation medicines day after undergoing a palliative diverting ileostomy. He was discharged on 06/26/2012. He is continuing physical therapy at home.  [name] [name] is out of bed all day and is ambulating with a walker. He is tolerating a diet including Carnation instant breakfast. The ileostomy is functioning. He reports emptying her bag 4-5 times per day. The stool is  Semi-formed. The pain he had on admission to the hospital last month has resolved. He reports several episodes of urinary incontinence at night. [name] appetite is poor, but he has maintained [name] weight since discharge from the hospital.  Objective:  Vital signs in last 24 hours:  Blood pressure 99/63, pulse 71, temperature 96.3 F (35.7 C), temperature source Oral, resp. rate 18, height 6\' 1"  (1.854 m), weight 169 lb 4.8 oz (76.794 kg).    HEENT: Neck without mass Lymphatics: ? Pea-sized left scalene node lateral to the surgical scar, no other neck nodes. "Shotty "bilateral axillary nodes, no inguinal nodes Resp: Clear bilaterally Cardio: Regular rate and rhythm GI: Low abdominal ileostomy with semi-formed stool, no hepatomegaly, no apparent ascites, nodularity at the left abdomen near a trocar site-no other mass Vascular: Trace pitting ankle edema bilaterally     Lab Results: 06/25/2012-BUN 16, creatinine 1.12, white count 5.7, hemoglobin 10.1, platelets 325, ANC on 06/19/2012-5.3    Medications: I have reviewed the patient's current medications.  Assessment/Plan: 1. Metastatic gastric cancer-adenocarcinoma documented on the biopsy of the stomach and transverse colon. Exploratory laparoscopy 06/13/2012 with no tumor seen, positive cytology for metastatic adenocarcinoma. Biopsy of a left scalene nodal  06/13/2012 confirmed metastatic adenocarcinoma.  2. Partial colonic obstruction secondary to tumor invading the transverse colon-status post a palliative diverting distal ileostomy 06/10/2012  3. History of atrial fibrillation-maintained on xarelto  4. Anorexia/weight loss secondary to metastatic gastric cancer and the colonic obstruction  Disposition:  [name] [name] has been diagnosed with metastatic gastric cancer. I discussed the prognosis and treatment options with [name] [name] and [name] [name]. He understands no therapy will be curative. We discussed a trial of systemic chemotherapy versus comfort care. [name] performance status has improved significantly since undergoing the ileostomy procedure. He is tolerating a diet and ambulating. He appears to be a candidate for systemic chemotherapy. We discussed CAPOX and FOLFOX chemotherapy. I explained there is no "standard "best chemotherapy regimen for metastatic gastric cancer. The tumor returned HER-2 negative. In patients with bulk carcinomatosis the prognosis is poor and chemotherapy is generally not recommended. There was no gross evidence of metastatic disease at the exploratory laparoscopy on 06/13/2012.  He would like to proceed with a trial of chemotherapy. The plan is to proceed with CAPOX. We reviewed the potential toxicities associated with this regimen including the chance for nausea/vomiting, mucositis, diarrhea, and hematologic toxicity. We discussed the skin rash, hyperpigmentation, and hand/foot syndrome associated with capecitabine. We reviewed the various types of neuropathy associated with oxaliplatin. He will attend a chemotherapy teaching class.  The plan is to begin a first cycle of CAPOX on 07/11/2012. He will return for an office visit and cycle 2 on Aug 01 2012. [name] [name] will contact us in the interim for new symptoms. We will schedule an appointment with the cancer [name] nutritionist.  It will be difficult to follow [name] response  to  therapy. The CEA was normal. We will follow him for reaccumulation of ascites with the plan to obtain a repeat CT at a 3 to four-month interval to followup on the thickened transverse colon. We may also considering repeat colonoscopy or upper endoscopy for restaging.  We decided to eliminate several of [name] nonessential medications.  Approximately 45 minutes were spent with patient today. The majority of time was spent in counseling/coordination of care.   Thornton Papas, MD  07/04/2012  5:23 PM

## 2012-07-05 NOTE — Addendum Note (Signed)
Addended by: Gala Romney on: 07/05/2012 10:27 AM   Modules accepted: Orders, Medications

## 2012-07-08 ENCOUNTER — Other Ambulatory Visit: Payer: Self-pay | Admitting: Oncology

## 2012-07-08 ENCOUNTER — Encounter: Payer: Self-pay | Admitting: Pharmacist

## 2012-07-08 ENCOUNTER — Other Ambulatory Visit: Payer: Self-pay | Admitting: *Deleted

## 2012-07-08 DIAGNOSIS — C169 Malignant neoplasm of stomach, unspecified: Secondary | ICD-10-CM

## 2012-07-08 MED ORDER — PROCHLORPERAZINE MALEATE 10 MG PO TABS: 10.0000 mg | ORAL_TABLET | Freq: Four times a day (QID) | ORAL | Status: DC | PRN

## 2012-07-08 MED ORDER — CAPECITABINE 500 MG PO TABS: 1500.0000 mg | ORAL_TABLET | Freq: Two times a day (BID) | ORAL | Status: DC

## 2012-07-08 NOTE — Progress Notes (Signed)
New script for Xeloda forwarded to [facility] in managed care department.

## 2012-07-09 ENCOUNTER — Ambulatory Visit: Payer: Medicare Other

## 2012-07-09 ENCOUNTER — Encounter: Payer: Self-pay | Admitting: Oncology

## 2012-07-09 ENCOUNTER — Encounter: Payer: Self-pay | Admitting: *Deleted

## 2012-07-09 NOTE — Progress Notes (Signed)
Sent xeloda prescription to [facility] but they are unable to fill it, sent to [facility].

## 2012-07-10 ENCOUNTER — Other Ambulatory Visit: Payer: Self-pay | Admitting: Oncology

## 2012-07-11 ENCOUNTER — Other Ambulatory Visit: Payer: Medicare Other | Admitting: Lab

## 2012-07-11 ENCOUNTER — Ambulatory Visit (HOSPITAL_BASED_OUTPATIENT_CLINIC_OR_DEPARTMENT_OTHER): Payer: Medicare Other

## 2012-07-11 ENCOUNTER — Telehealth: Payer: Self-pay | Admitting: *Deleted

## 2012-07-11 ENCOUNTER — Ambulatory Visit: Payer: Medicare Other | Admitting: Nutrition

## 2012-07-11 VITALS — BP 111/71 | HR 81 | Temp 95.8°F

## 2012-07-11 DIAGNOSIS — C785 Secondary malignant neoplasm of large intestine and rectum: Secondary | ICD-10-CM

## 2012-07-11 DIAGNOSIS — Z5111 Encounter for antineoplastic chemotherapy: Secondary | ICD-10-CM

## 2012-07-11 DIAGNOSIS — C169 Malignant neoplasm of stomach, unspecified: Secondary | ICD-10-CM

## 2012-07-11 MED ORDER — DEXTROSE 5 % IV SOLN
Freq: Once | INTRAVENOUS | Status: AC
  Administered 2012-07-11: 12:00:00 via INTRAVENOUS

## 2012-07-11 MED ORDER — DEXAMETHASONE SODIUM PHOSPHATE 10 MG/ML IJ SOLN
10.0000 mg | Freq: Once | INTRAMUSCULAR | Status: AC
  Administered 2012-07-11: 10 mg via INTRAVENOUS

## 2012-07-11 MED ORDER — OXALIPLATIN CHEMO INJECTION 100 MG/20ML
85.0000 mg/m2 | Freq: Once | INTRAVENOUS | Status: AC
  Administered 2012-07-11: 170 mg via INTRAVENOUS
  Filled 2012-07-11: qty 34

## 2012-07-11 MED ORDER — ONDANSETRON 8 MG/50ML IVPB (CHCC)
8.0000 mg | Freq: Once | INTRAVENOUS | Status: AC
  Administered 2012-07-11: 8 mg via INTRAVENOUS

## 2012-07-11 NOTE — Telephone Encounter (Signed)
Call from pt reporting [facility] will not be able to deliver Xeloda until 07/15/12. Pt had his Oxaliplatin treatment today. Pt is also requesting a port a cath for his next chemotherapy treatment. Left message for [facility] with [facility] to follow up.

## 2012-07-11 NOTE — Progress Notes (Signed)
This is a 79 year old male patient of Dr. Truett Perna diagnosed with gastric cancer.   Past medical history includes hypertension, osteoarthritis, anxiety, stroke, Bell's palsy, atrial fibrillation, and ileostomy on 06/14/2012.  Medications include Cardizem, ferrous sulfate, Prilosec, and Carafate.  Labs include glucose of 120 on April 8.  Height: 6 feet 1 inch. Weight: 169.3 pounds. Usual body weight: 212 pounds January 17. BMI: 22.34.  Patient receiving first chemotherapy today. He has lost approximately 40 pounds over the last 3 months. He reports eating less but did not provided specific diet history. Patient does report poor appetite.  Nutrition diagnosis: Unintended weight loss related to new diagnosis of gastric cancer as evidenced by 20% weight loss over the past 3 months.  Patient meets criteria for severe malnutrition in the context of chronic illness secondary to greater than 7% weight loss in 3 months and both fat and muscle depletion.  Intervention: Patient was educated to continue high-calorie, high-protein foods and frequent, small meals throughout the day. I've educated him on strategies for eating with his ileostomy. We have discussed foods to consume and foods he may want to avoid. I've educated him on the importance of protein and adequate calories. I have encouraged increased fluid intake. Patient is to continue Carnation breakfast essentials as tolerated. Recipes provided to patient. Fact sheets were provided and teach back method used. Contact information given.  Monitoring, evaluation, goals: Patient will tolerate increased calories and protein to minimize further weight loss.  Next visit: Thursday, May 15, during chemotherapy.

## 2012-07-11 NOTE — Progress Notes (Signed)
Venous access is less than optimal-required #2 sticks for adequate vein for Oxaliplatin infusion. Patient is requesting PAC for next treatment. Dr. Truett Perna notified of request. No adverse event related to oxaliplatin infusion. No pain or swelling or redness at IV site. Reviewed PAC booklet with patient and wife and showed him example of one.

## 2012-07-11 NOTE — Patient Instructions (Signed)
[facility] Discharge Instructions for Patients Receiving Chemotherapy  Today you received the following chemotherapy agent: Oxaliplatin Call office when you receive your Xeloda medication.  To help prevent nausea and vomiting after your treatment, we encourage you to take your nausea medication: Compazine 10 mg every 6 hours as needed Begin taking it at 1st sign of nausea and take it as often as prescribed.   If you develop nausea and vomiting that is not controlled by your nausea medication, call the clinic. If it is after clinic hours your family physician or the after hours number for the clinic or go to the Emergency Department.   BELOW ARE SYMPTOMS THAT SHOULD BE REPORTED IMMEDIATELY:  *FEVER GREATER THAN 100.5 F  *CHILLS WITH OR WITHOUT FEVER  NAUSEA AND VOMITING THAT IS NOT CONTROLLED WITH YOUR NAUSEA MEDICATION  *UNUSUAL SHORTNESS OF BREATH  *UNUSUAL BRUISING OR BLEEDING  TENDERNESS IN MOUTH AND THROAT WITH OR WITHOUT PRESENCE OF ULCERS  *URINARY PROBLEMS  *BOWEL PROBLEMS  UNUSUAL RASH Items with * indicate a potential emergency and should be followed up as soon as possible.  One of the nurses will contact you 24 hours after your treatment. Please let the nurse know about any problems that you may have experienced. Feel free to call the clinic you have any questions or concerns. The clinic phone number is [phone]. REMEMBER TO AVOID EATING/TOUCHING COLD FOR AT LEAST 5-7 DAYS, then slowly try small amounts as tolerated.   I have been informed and understand all the instructions given to me. I know to contact the clinic, my physician, or go to the Emergency Department if any problems should occur. I do not have any questions at this time, but understand that I may call the clinic during office hours   should I have any questions or need assistance in obtaining follow up care.    __________________________________________  _____________   __________ Signature of Patient or Authorized Representative            Date                   Time    __________________________________________ Nurse's Signature

## 2012-07-12 ENCOUNTER — Telehealth: Payer: Self-pay | Admitting: *Deleted

## 2012-07-12 NOTE — Telephone Encounter (Signed)
Spoke with wife-no adverse event from chemo. No pain at IV site. Still has concerns about ostomy output being so liquid. Reinforced dietician advise from yesterday to try to bulk up stool with metamucil. Also suggested eating some marshmallows, which has proven helpful by patients. Received the Xeloda today and took 1st dose this morning after breakfast. Still requests PAC.

## 2012-07-12 NOTE — Telephone Encounter (Signed)
Message from pt's wife reporting they received Xeloda today. Pt will begin med today.

## 2012-07-14 ENCOUNTER — Telehealth: Payer: Self-pay | Admitting: Oncology

## 2012-07-14 ENCOUNTER — Other Ambulatory Visit: Payer: Self-pay

## 2012-07-14 ENCOUNTER — Encounter (HOSPITAL_COMMUNITY): Payer: Self-pay | Admitting: Adult Health

## 2012-07-14 DIAGNOSIS — Z8673 Personal history of transient ischemic attack (TIA), and cerebral infarction without residual deficits: Secondary | ICD-10-CM | POA: Insufficient documentation

## 2012-07-14 DIAGNOSIS — M1990 Unspecified osteoarthritis, unspecified site: Secondary | ICD-10-CM | POA: Insufficient documentation

## 2012-07-14 DIAGNOSIS — I4891 Unspecified atrial fibrillation: Secondary | ICD-10-CM | POA: Insufficient documentation

## 2012-07-14 DIAGNOSIS — Z932 Ileostomy status: Secondary | ICD-10-CM | POA: Insufficient documentation

## 2012-07-14 DIAGNOSIS — R1013 Epigastric pain: Secondary | ICD-10-CM | POA: Insufficient documentation

## 2012-07-14 DIAGNOSIS — Z87891 Personal history of nicotine dependence: Secondary | ICD-10-CM | POA: Insufficient documentation

## 2012-07-14 DIAGNOSIS — I10 Essential (primary) hypertension: Secondary | ICD-10-CM | POA: Insufficient documentation

## 2012-07-14 DIAGNOSIS — Z8669 Personal history of other diseases of the nervous system and sense organs: Secondary | ICD-10-CM | POA: Insufficient documentation

## 2012-07-14 DIAGNOSIS — I4892 Unspecified atrial flutter: Secondary | ICD-10-CM | POA: Insufficient documentation

## 2012-07-14 DIAGNOSIS — Z880 Allergy status to penicillin: Secondary | ICD-10-CM | POA: Insufficient documentation

## 2012-07-14 DIAGNOSIS — Z79899 Other long term (current) drug therapy: Secondary | ICD-10-CM | POA: Insufficient documentation

## 2012-07-14 DIAGNOSIS — C169 Malignant neoplasm of stomach, unspecified: Secondary | ICD-10-CM | POA: Insufficient documentation

## 2012-07-14 NOTE — ED Notes (Signed)
Presents with epigastric pain described as burning began this afternoon and has not gone away despite the use of tums and pepto. Pain is intermittent. Described as "heartburn" denies SOB and nausea.

## 2012-07-14 NOTE — Telephone Encounter (Signed)
Medical Oncology on call  Patient on cycle 1 CAPOX for metastatic gastric, began xeloda 07-12-12. Had episode of severe heartburn with some nausea this afternoon, no vomiting, lasted ~ 30 min and improved after he drank some warm milk. Is already on prilosec 20 mg bid, carafate tid, took compazine ~ an hour ago.  No appetite tho has been able to tolerate [employer] as "hot chocolate" drinking ~ 2 daily but concerned that he is still losing weight. Told patient to take his pm prilosec now and continue carafate. Called in zofran 8 mg 1-2 q 12 hr prn nausea #20 1 RF to [employer]. Told him to try to increase Carnation Instant to 3-4 daily. ( He has already met with Vernell Leep) Asked him to call Dr Kalman Drape RN tomorrow to let us know how he is.  Ila Mcgill, MD

## 2012-07-15 ENCOUNTER — Other Ambulatory Visit: Payer: Self-pay | Admitting: *Deleted

## 2012-07-15 ENCOUNTER — Emergency Department (HOSPITAL_COMMUNITY)
Admission: EM | Admit: 2012-07-15 | Discharge: 2012-07-15 | Disposition: A | Discharge status: Home / Self Care | Payer: Medicare Other | Attending: Emergency Medicine | Admitting: Emergency Medicine

## 2012-07-15 ENCOUNTER — Emergency Department (HOSPITAL_COMMUNITY)
Admission: EM | Admit: 2012-07-15 | Discharge: 2012-07-16 | Disposition: A | Discharge status: Home / Self Care | Payer: Medicare Other | Attending: Emergency Medicine | Admitting: Emergency Medicine

## 2012-07-15 ENCOUNTER — Encounter (HOSPITAL_COMMUNITY): Payer: Self-pay | Admitting: *Deleted

## 2012-07-15 ENCOUNTER — Telehealth: Payer: Self-pay | Admitting: *Deleted

## 2012-07-15 DIAGNOSIS — C169 Malignant neoplasm of stomach, unspecified: Secondary | ICD-10-CM

## 2012-07-15 DIAGNOSIS — Z8739 Personal history of other diseases of the musculoskeletal system and connective tissue: Secondary | ICD-10-CM | POA: Insufficient documentation

## 2012-07-15 DIAGNOSIS — I4891 Unspecified atrial fibrillation: Secondary | ICD-10-CM | POA: Insufficient documentation

## 2012-07-15 DIAGNOSIS — Z8669 Personal history of other diseases of the nervous system and sense organs: Secondary | ICD-10-CM | POA: Insufficient documentation

## 2012-07-15 DIAGNOSIS — I10 Essential (primary) hypertension: Secondary | ICD-10-CM | POA: Insufficient documentation

## 2012-07-15 DIAGNOSIS — R1013 Epigastric pain: Secondary | ICD-10-CM | POA: Insufficient documentation

## 2012-07-15 DIAGNOSIS — Z8679 Personal history of other diseases of the circulatory system: Secondary | ICD-10-CM | POA: Insufficient documentation

## 2012-07-15 DIAGNOSIS — Z880 Allergy status to penicillin: Secondary | ICD-10-CM | POA: Insufficient documentation

## 2012-07-15 DIAGNOSIS — Z79899 Other long term (current) drug therapy: Secondary | ICD-10-CM | POA: Insufficient documentation

## 2012-07-15 DIAGNOSIS — Z87891 Personal history of nicotine dependence: Secondary | ICD-10-CM | POA: Insufficient documentation

## 2012-07-15 DIAGNOSIS — Z8673 Personal history of transient ischemic attack (TIA), and cerebral infarction without residual deficits: Secondary | ICD-10-CM | POA: Insufficient documentation

## 2012-07-15 LAB — COMPREHENSIVE METABOLIC PANEL
BUN: 23 mg/dL (ref 6–23)
Calcium: 10.5 mg/dL (ref 8.4–10.5)
GFR calc Af Amer: 69 mL/min — ABNORMAL LOW (ref >90)
Glucose, Bld: 132 mg/dL — ABNORMAL HIGH (ref 70–99)
Sodium: 132 mEq/L — ABNORMAL LOW (ref 135–145)
Total Protein: 6.7 g/dL (ref 6.0–8.3)

## 2012-07-15 LAB — CBC WITH DIFFERENTIAL/PLATELET
Basophils Absolute: 0 10*3/uL (ref 0.0–0.1)
HCT: 33 % — ABNORMAL LOW (ref 39.0–52.0)
Hemoglobin: 11.7 g/dL — ABNORMAL LOW (ref 13.0–17.0)
Lymphocytes Relative: 21 % (ref 12–46)
Monocytes Absolute: 0.8 10*3/uL (ref 0.1–1.0)
Monocytes Relative: 13 % — ABNORMAL HIGH (ref 3–12)
Neutro Abs: 4 10*3/uL (ref 1.7–7.7)
RBC: 3.63 MIL/uL — ABNORMAL LOW (ref 4.22–5.81)
WBC: 6.1 10*3/uL (ref 4.0–10.5)

## 2012-07-15 LAB — TROPONIN I: Troponin I: <0.3 ng/mL (ref <0.30)

## 2012-07-15 LAB — LIPASE, BLOOD: Lipase: 57 U/L (ref 11–59)

## 2012-07-15 MED ORDER — MEGESTROL ACETATE 40 MG/ML PO SUSP: 200.0000 mg | Freq: Two times a day (BID) | ORAL | Status: DC

## 2012-07-15 MED ORDER — GI COCKTAIL ~~LOC~~
30.0000 mL | Freq: Once | ORAL | Status: AC
  Administered 2012-07-15: 30 mL via ORAL
  Filled 2012-07-15: qty 30

## 2012-07-15 NOTE — ED Notes (Signed)
ZOX:[id number]<AV> Expected date:07/15/12<BR> Expected time: 8:39 PM<BR> Means of arrival:Ambulance<BR> Comments:<BR> Cancer patient; hypotensive

## 2012-07-15 NOTE — ED Notes (Signed)
EMS called to home.  Found patient sitting alert and oriented x3..  Patient complains of  Weakness with indigestion that started since yesterday.  Patient was seen at [facility]  For same complaints yesterday.  Denies pain or nausea

## 2012-07-15 NOTE — Telephone Encounter (Signed)
Called patient to follow up on status of his heartburn/reflux. Reports it is better today with bid Omeprazole and liquid antacid prn. Is asking for appetite stimulant-can't eat. OK for megace per Dr. Truett Perna.

## 2012-07-15 NOTE — ED Notes (Signed)
Pharmacy in room at this time.

## 2012-07-15 NOTE — ED Provider Notes (Signed)
History     CSN: [id number]  Arrival date & time 07/14/12  2301   First MD Initiated Contact with Patient 07/15/12 [phone]      Chief Complaint  Patient presents with  . Abdominal Pain   HPI Ranson [name] [name] is a 79 y.o. male with a history of gastric cancer that had spread to the colon was fairly status post resection and ileostomy presents with epigastric pain. He says this started this afternoon, as moderate, occurs about every 45 minutes then goes away, is a burning sensation, it did not go omeprazole. TUMS did not work either. Patient was recently put on omeprazole by Dr. Alcide Evener his oncologist.  There is no chest pain, no shortness of breath, there have been some mild nausea, no vomiting, no diarrhea. Laying down makes his symptoms worse.   Past Medical History  Diagnosis Date  . Ramsay Hunt auricular syndrome   . HTN (hypertension)   . Osteoarthritis   . Dysrhythmia 04/17/12  . Anxiety   . Stroke   . H/O Bell's palsy at age 61  . Atrial flutter   . Atrial fibrillation   . Ileostomy in place 06/14/2012    Past Surgical History  Procedure Laterality Date  . Hernia repair    . Vasectomy    . Prostate biopsies      per Vonita Moss  . Cataract extraction      SE eye  . Colonoscopy N/A 06/03/2012    Procedure: COLONOSCOPY;  Surgeon: Florencia Reasons, MD;  Location: [facility] ENDOSCOPY;  Service: Endoscopy;  Laterality: N/A;  . Esophagogastroduodenoscopy N/A 06/03/2012    Procedure: ESOPHAGOGASTRODUODENOSCOPY (EGD);  Surgeon: Florencia Reasons, MD;  Location: [facility] ENDOSCOPY;  Service: Endoscopy;  Laterality: N/A;  . Laparoscopy N/A 06/13/2012    Procedure: EXPLORATORY LAPAROSCOPY ;  Surgeon: Liz Malady, MD;  Location: [facility] OR;  Service: General;  Laterality: N/A;  . Diverting ileostomy N/A 06/13/2012    Procedure: DIVERTING LOOP ILEOSTOMY;  Surgeon: Liz Malady, MD;  Location: [facility] OR;  Service: General;  Laterality: N/A;  . Lymph node biopsy Left 06/13/2012    Procedure: LYMPH NODE  BIOPSY;  Surgeon: Liz Malady, MD;  Location: [facility] OR;  Service: General;  Laterality: Left;  Scalene Lymph Node Biopsy    Family History  Problem Relation Age of Onset  . Cancer Father   . Diabetes Father     History  Substance Use Topics  . Smoking status: Former Smoker    Quit date: 06/22/1984  . Smokeless tobacco: Never Used  . Alcohol Use: 3.5 oz/week    7 drink(s) per week     Comment: 1 glass of wine a day      Review of Systems At least 10pt or greater review of systems completed and are negative except where specified in the HPI.  Allergies  Penicillins  Home Medications   Current Outpatient Rx  Name  Route  Sig  Dispense  Refill  . capecitabine (XELODA) 500 MG tablet   Oral   Take 1,500 mg by mouth 2 (two) times daily after a meal. X 14 days, 7 days rest Start on 07/11/12         . diltiazem (CARDIZEM CD) 120 MG 24 hr capsule   Oral   Take 1 capsule (120 mg total) by mouth every morning. For atrial fibrillation   30 capsule   1   . ferrous sulfate 325 (65 FE) MG tablet   Oral   Take  1 tablet (325 mg total) by mouth 2 (two) times daily with a meal.   60 tablet   1   . Multiple Vitamins-Minerals (PRESERVISION AREDS PO)   Oral   Take 1 tablet by mouth 2 (two) times daily.         Marland Kitchen omeprazole (PRILOSEC) 20 MG capsule   Oral   Take 20 mg by mouth 2 (two) times daily.          . ondansetron (ZOFRAN) 4 MG tablet   Oral   Take 4 mg by mouth every 8 (eight) hours as needed for nausea.         . prochlorperazine (COMPAZINE) 10 MG tablet   Oral   Take 10 mg by mouth every 6 (six) hours as needed (for nausea).         . sucralfate (CARAFATE) 1 G tablet   Oral   Take 1 g by mouth 3 (three) times daily before meals.            BP 134/82  Pulse 73  Temp(Src) 98 F (36.7 C) (Oral)  Resp 20  SpO2 100%  Physical Exam  Nursing notes reviewed.  Electronic medical record reviewed. VITAL SIGNS:   Filed Vitals:   07/15/12 0530  07/15/12 0600 07/15/12 0630 07/15/12 0700  BP: 121/72 133/85 113/76 134/82  Pulse: 84 75 76 73  Temp:      TempSrc:      Resp: 23 23 20    SpO2: 97% 99% 98% 100%   CONSTITUTIONAL: Awake, oriented, appears non-toxic HENT: Atraumatic, normocephalic, oral mucosa pink and moist, airway patent. Nares patent without drainage. External ears normal. EYES: Conjunctiva clear, EOMI, PERRLA NECK: Trachea midline, non-tender, supple CARDIOVASCULAR: Normal heart rate, Normal rhythm, No murmurs, rubs, gallops PULMONARY/CHEST: Clear to auscultation, no rhonchi, wheezes, or rales. Symmetrical breath sounds. Non-tender. ABDOMINAL: Non-distended, soft, non-tender - no rebound or guarding.  BS normal. NEUROLOGIC: Non-focal, moving all four extremities, no gross sensory or motor deficits. EXTREMITIES: No clubbing, cyanosis, or edema SKIN: Warm, Dry, No erythema, No rash  ED Course  Procedures (including critical care time)  Labs Reviewed  COMPREHENSIVE METABOLIC PANEL - Abnormal; Notable for the following:    Sodium 132 (*)    Glucose, Bld 132 (*)    Albumin 3.2 (*)    GFR calc non Af Amer 60 (*)    GFR calc Af Amer 69 (*)    All other components within normal limits  CBC WITH DIFFERENTIAL - Abnormal; Notable for the following:    RBC 3.63 (*)    Hemoglobin 11.7 (*)    HCT 33.0 (*)    Monocytes Relative 13 (*)    All other components within normal limits  LIPASE, BLOOD  TROPONIN I  POCT I-STAT TROPONIN I   No results found.   1. Epigastric abdominal pain   2. Stomach cancer       MDM  Patient history of gastric cancer and chemotherapy with a poor appetite presents with epigastric pain, this is a burning sensation worse when he lays down occurring about 45 minutes cycles. Currently he is pain-free. Labs were obtained per protocol, troponin is negative, LFTs are unremarkable.  Troponin and lipase are also unremarkable. Patient got complete relief with a GI cocktail, the pain has not come  back.  I doubt ACS or anginal equivalent.  Patient was recently started on omeprazole for worsening symptoms per his oncologist, likely the omeprazole has not had time to become completely effective.  Patient is only taking TUMS at home and has a poor appetite. Per his wife he has not eaten very much at all, has eaten an instant breakfast and not much else all day.  With the intermittent pain, nonsurgical abdomen, normal vital signs, patient did nontoxic do not think the patient has a perforation, do not think any further labs or imaging is indicated at this time.  Patient will start taking Maalox or other liquid antacid as well as TUMS and continue to take omeprazole. Patient to call Dr. Truett Perna and followup and also discuss his poor appetite.        Jones Skene, MD 07/15/12 [phone]

## 2012-07-16 ENCOUNTER — Encounter: Payer: Self-pay | Admitting: Oncology

## 2012-07-16 ENCOUNTER — Other Ambulatory Visit: Payer: Self-pay | Admitting: *Deleted

## 2012-07-16 ENCOUNTER — Ambulatory Visit (HOSPITAL_BASED_OUTPATIENT_CLINIC_OR_DEPARTMENT_OTHER): Payer: Medicare Other | Admitting: Nurse Practitioner

## 2012-07-16 ENCOUNTER — Other Ambulatory Visit: Payer: Self-pay | Admitting: General Surgery

## 2012-07-16 ENCOUNTER — Encounter (HOSPITAL_BASED_OUTPATIENT_CLINIC_OR_DEPARTMENT_OTHER): Payer: Self-pay | Admitting: *Deleted

## 2012-07-16 ENCOUNTER — Encounter: Payer: Self-pay | Admitting: Nurse Practitioner

## 2012-07-16 ENCOUNTER — Telehealth: Payer: Self-pay | Admitting: Oncology

## 2012-07-16 ENCOUNTER — Encounter (HOSPITAL_COMMUNITY): Payer: Self-pay

## 2012-07-16 ENCOUNTER — Emergency Department (HOSPITAL_COMMUNITY): Payer: Medicare Other

## 2012-07-16 VITALS — BP 100/64 | HR 73 | Temp 96.4°F | Resp 18 | Ht 73.0 in | Wt 164.6 lb

## 2012-07-16 DIAGNOSIS — I4891 Unspecified atrial fibrillation: Secondary | ICD-10-CM

## 2012-07-16 DIAGNOSIS — C169 Malignant neoplasm of stomach, unspecified: Secondary | ICD-10-CM

## 2012-07-16 DIAGNOSIS — R6881 Early satiety: Secondary | ICD-10-CM | POA: Insufficient documentation

## 2012-07-16 DIAGNOSIS — E860 Dehydration: Secondary | ICD-10-CM

## 2012-07-16 DIAGNOSIS — R52 Pain, unspecified: Secondary | ICD-10-CM

## 2012-07-16 DIAGNOSIS — R630 Anorexia: Secondary | ICD-10-CM

## 2012-07-16 DIAGNOSIS — K5730 Diverticulosis of large intestine without perforation or abscess without bleeding: Secondary | ICD-10-CM

## 2012-07-16 LAB — URINALYSIS, ROUTINE W REFLEX MICROSCOPIC
Bilirubin Urine: NEGATIVE
Nitrite: NEGATIVE
Specific Gravity, Urine: 1.031 — ABNORMAL HIGH (ref 1.005–1.030)
Urobilinogen, UA: 0.2 mg/dL (ref 0.0–1.0)

## 2012-07-16 LAB — URINE MICROSCOPIC-ADD ON

## 2012-07-16 MED ORDER — HYDROCODONE-ACETAMINOPHEN 5-325 MG PO TABS: 1.0000 | ORAL_TABLET | ORAL | Status: DC | PRN

## 2012-07-16 MED ORDER — SODIUM CHLORIDE 0.9 % IV BOLUS (SEPSIS)
1000.0000 mL | Freq: Once | INTRAVENOUS | Status: AC
  Administered 2012-07-16: 1000 mL via INTRAVENOUS

## 2012-07-16 MED ORDER — SODIUM CHLORIDE 0.9 % IV SOLN: 1000.0000 mL | Freq: Once | INTRAVENOUS | Status: DC

## 2012-07-16 MED ORDER — GI COCKTAIL ~~LOC~~
30.0000 mL | Freq: Once | ORAL | Status: AC
  Administered 2012-07-16: 30 mL via ORAL
  Filled 2012-07-16: qty 30

## 2012-07-16 MED ORDER — MORPHINE SULFATE 2 MG/ML IJ SOLN
2.0000 mg | Freq: Once | INTRAMUSCULAR | Status: AC
  Administered 2012-07-16: 2 mg via INTRAVENOUS
  Filled 2012-07-16: qty 1

## 2012-07-16 MED ORDER — FENTANYL CITRATE 0.05 MG/ML IJ SOLN
50.0000 ug | Freq: Once | INTRAMUSCULAR | Status: AC
  Administered 2012-07-16: 50 ug via INTRAVENOUS
  Filled 2012-07-16: qty 2

## 2012-07-16 NOTE — Telephone Encounter (Signed)
gv and printed appt sched and avs for pt for April and May....MB added IVF

## 2012-07-16 NOTE — ED Notes (Signed)
Pt is dehydrated and is not getting a return for  labs.  Will try again in 30 minutes.  Informed RN.

## 2012-07-16 NOTE — ED Notes (Signed)
Lab called to inform that they would assist after morning rounds.

## 2012-07-16 NOTE — Progress Notes (Signed)
Pt was in hospital for gastric cancer surgery 3/14-has ileostomy Had n/v and dehydration 07/15/12 went to er-was given fluids and labs done-got home this am-feels much better-sounds better than I expected-he is going to cancer center 07/17/12 for more iv fluids-will be here 07/18/12 for [facility]

## 2012-07-16 NOTE — ED Provider Notes (Signed)
History     CSN: [id number]  Arrival date & time 07/15/12  2041   First MD Initiated Contact with Patient 07/16/12 0143      Chief Complaint  Patient presents with  . Weakness    (Consider location/radiation/quality/duration/timing/severity/associated sxs/prior treatment) HPI This is a 79 year old male with recently diagnosed stomach cancer and also involving his colon. He had a prolonged hospitalization in March during which a diverting ileostomy was performed. He has recently been started on chemotherapy. He has had 2-3 days of epigastric pain. The pain is episodic. It is described as burning and like indigestion. It is moderate to severe at its worst. There is no specific exacerbating or mitigating factors. It has not been relieved by antacids or proton pump inhibitor. He was seen at [facility] 2 days ago for the same complaint where antacids were added to his PPI. There is no associated frank chest pain, shortness of breath, diaphoresis, nausea or vomiting.   He has had a poor appetite and poor intake. He was noted to be hypotensive in the ED. He was given 500 mL of normal saline with some improvement the  Past Medical History  Diagnosis Date  . Ramsay Hunt auricular syndrome   . HTN (hypertension)   . Osteoarthritis   . Dysrhythmia 04/17/12  . Anxiety   . Stroke   . H/O Bell's palsy at age 68  . Atrial flutter   . Atrial fibrillation   . Ileostomy in place 06/14/2012    Past Surgical History  Procedure Laterality Date  . Hernia repair    . Vasectomy    . Prostate biopsies      per Vonita Moss  . Cataract extraction      SE eye  . Colonoscopy N/A 06/03/2012    Procedure: COLONOSCOPY;  Surgeon: Florencia Reasons, MD;  Location: [facility] ENDOSCOPY;  Service: Endoscopy;  Laterality: N/A;  . Esophagogastroduodenoscopy N/A 06/03/2012    Procedure: ESOPHAGOGASTRODUODENOSCOPY (EGD);  Surgeon: Florencia Reasons, MD;  Location: [facility] ENDOSCOPY;  Service: Endoscopy;  Laterality: N/A;  .  Laparoscopy N/A 06/13/2012    Procedure: EXPLORATORY LAPAROSCOPY ;  Surgeon: Liz Malady, MD;  Location[facility] OR;  Service: General;  Laterality: N/A;  . Diverting ileostomy N/A 06/13/2012    Procedure: DIVERTING LOOP ILEOSTOMY;  Surgeon: Liz Malady, MD;  Location: [facility] OR;  Service: General;  Laterality: N/A;  . Lymph node biopsy Left 06/13/2012    Procedure: LYMPH NODE BIOPSY;  Surgeon: Liz Malady, MD;  Location: [facility] OR;  Service: General;  Laterality: Left;  Scalene Lymph Node Biopsy    Family History  Problem Relation Age of Onset  . Cancer Father   . Diabetes Father     History  Substance Use Topics  . Smoking status: Former Smoker    Quit date: 06/22/1984  . Smokeless tobacco: Never Used  . Alcohol Use: 3.5 oz/week    7 drink(s) per week     Comment: 1 glass of wine a day      Review of Systems  All other systems reviewed and are negative.    Allergies  Penicillins  Home Medications   Current Outpatient Rx  Name  Route  Sig  Dispense  Refill  . acetaminophen (TYLENOL) 500 MG tablet   Oral   Take 1,000 mg by mouth once.         . capecitabine (XELODA) 500 MG tablet   Oral   Take 1,500 mg by mouth 2 (two) times  daily after a meal. X 14 days, 7 days rest Start on 07/11/12         . diltiazem (CARDIZEM CD) 120 MG 24 hr capsule   Oral   Take 1 capsule (120 mg total) by mouth every morning. For atrial fibrillation   30 capsule   1   . ferrous sulfate 325 (65 FE) MG tablet   Oral   Take 1 tablet (325 mg total) by mouth 2 (two) times daily with a meal.   60 tablet   1   . Multiple Vitamins-Minerals (PRESERVISION AREDS PO)   Oral   Take 1 tablet by mouth 2 (two) times daily.         Marland Kitchen omeprazole (PRILOSEC) 20 MG capsule   Oral   Take 20 mg by mouth 2 (two) times daily.          . ondansetron (ZOFRAN) 4 MG tablet   Oral   Take 4 mg by mouth every 8 (eight) hours as needed for nausea.         . prochlorperazine (COMPAZINE) 10 MG  tablet   Oral   Take 10 mg by mouth every 6 (six) hours as needed (nausea).         . Rivaroxaban (XARELTO) 20 MG TABS   Oral   Take 20 mg by mouth every evening.         . sucralfate (CARAFATE) 1 G tablet   Oral   Take 1 g by mouth 3 (three) times daily before meals.            BP 99/70  Pulse 76  Temp(Src) 98.1 F (36.7 C) (Oral)  Resp 17  SpO2 99%  Physical Exam General: Well-developed, well-nourished male in no acute distress; appearance consistent with age of record HENT: normocephalic, atraumatic Eyes: pupils equal round and reactive to light; extraocular muscles intact Neck: supple Heart: regular rate and rhythm Lungs: clear to auscultation bilaterally Abdomen: soft; nondistended; mild epigastric tenderness; bowel sounds hyperactive; ileostomy right lower quadrant Extremities: Arthritic changes; pulses normal; no edema Neurologic: Awake, alert and oriented; motor function intact in all extremities and symmetric; left facial droop involving the left eyelids Skin: Warm and dry Psychiatric: Normal mood and affect    ED Course  Procedures (including critical care time)     MDM   Nursing notes and vitals signs, including pulse oximetry, reviewed.  Summary of this visit's results, reviewed by myself:  Labs:  Results for orders placed during the hospital encounter of 07/15/12  URINALYSIS, ROUTINE W REFLEX MICROSCOPIC      Result Value Range   Color, Urine YELLOW  YELLOW   APPearance CLOUDY (*) CLEAR   Specific Gravity, Urine 1.031 (*) 1.005 - 1.030   pH 6.0  5.0 - 8.0   Glucose, UA NEGATIVE  NEGATIVE mg/dL   Hgb urine dipstick MODERATE (*) NEGATIVE   Bilirubin Urine NEGATIVE  NEGATIVE   Ketones, ur NEGATIVE  NEGATIVE mg/dL   Protein, ur NEGATIVE  NEGATIVE mg/dL   Urobilinogen, UA 0.2  0.0 - 1.0 mg/dL   Nitrite NEGATIVE  NEGATIVE   Leukocytes, UA NEGATIVE  NEGATIVE  URINE MICROSCOPIC-ADD ON      Result Value Range   Squamous Epithelial / LPF RARE   RARE   RBC / HPF 7-10  <3 RBC/hpf   Crystals CA OXALATE CRYSTALS (*) NEGATIVE  LIPASE, BLOOD      Result Value Range   Lipase 60 (*) 11 - 59 U/L  TROPONIN I      Result Value Range   Troponin I <0.30  <0.30 ng/mL      Imaging Studies: Ct Abdomen Pelvis Wo Contrast  07/16/2012  *RADIOLOGY REPORT*  Clinical Data: Abdominal pain.  Weakness and indigestion for 2 days.  CT ABDOMEN AND PELVIS WITHOUT CONTRAST  Technique:  Multidetector CT imaging of the abdomen and pelvis was performed following the standard protocol without intravenous contrast.  Comparison: 05/30/2012  Findings: Infiltration and interstitial fibrosis in the lung bases, greater than on previous study. Coronary artery calcification.  Diffuse free fluid in the upper abdomen.  Hounsfield unit measurements are consistent with ascites.  The amount of ascites appears relatively stable since previous study. The unenhanced appearance of the liver, spleen, gallbladder, pancreas, adrenal glands, and inferior vena cava is unremarkable. There is probably residual intrahepatic bile duct dilatation although the bile ducts are not well demonstrated on unenhanced scanning.  No extrahepatic ductal dilatation is visualized.  Hyperdense lesions demonstrated in both kidneys consistent with hemorrhagic cysts.  No hydronephrosis in either kidney.  Calcified and tortuous aorta without aneurysm.  Ectatic common iliac arteries, right measuring 1.5 cm diameter and left 1.5 cm diameter.  There is interval placement of right lower quadrant ostomy which appears to represent a diverting colostomy.  There is residual contrast material in the right colon and in the rectosigmoid colon.  The rest of the colon is mostly decompressed and cannot be evaluated for wall thickness. Residual or wall thickening is not excluded.  Small bowel loops are not distended but are diffusely fluid-filled.  Stomach is incompletely distended.  Gastric wall thickening is not excluded. No free  air in the abdomen.  Diffuse mesenteric edema.  Pelvis:  Prostate gland is enlarged.  Bladder wall appears thickened which could be due to under distension, hypertrophy, or cystitis.  Mild fluid in the pelvis extending down from the abdomen.  No evidence of abscess.  Degenerative changes in the lumbar spine.  Mild anterior subluxation of L4 on L5.  IMPRESSION: There appears to the interval placement of a diverting colostomy. Stomach, small bowel, and colon are decompressed which precludes evaluation for wall thickening. Residual inflammatory process in the stomach, small bowel, colon cannot be excluded on this examination.  No evidence of distension to suggest obstruction. Diffuse abdominal ascites appears relatively stable since previous study.  Infiltration in the lung bases.   Original Report Authenticated By: Burman Nieves, M.D.    7:06 AM Patient is significantly dehydrated based on blood pressures, urine specific gravity and difficulty obtaining venous access. He's been given over 2 L of normal saline by this time. His blood pressure is 134/82 with a pulse of 73.  There is no obvious etiology for the patient's pain per lab work and CT scan but he given his recent abdominal surgery, stomach cancer with involvement of the colon and current chemotherapy it may represent a multifactorial etiology. He is not currently on any pain control medications. Will start him on analgesia he will contact Dr. Truett Perna today.         Hanley Seamen, MD 07/16/12 [phone]

## 2012-07-16 NOTE — Progress Notes (Signed)
Called [employer] @ [phone] for a megestrol pa form.

## 2012-07-16 NOTE — ED Notes (Signed)
Called lab phleb.for assistance with blood draw.

## 2012-07-16 NOTE — Progress Notes (Signed)
Informed by [facility] that Megestrol requires prior authorization by insurance company. Call #[phone]. Forwarded to managed care department.

## 2012-07-16 NOTE — ED Notes (Signed)
RN attempted to get labs.  Pt is still dehydrated.  RN is giving anther bag of fluids.

## 2012-07-16 NOTE — Progress Notes (Signed)
[facility]    OFFICE PROGRESS NOTE   INTERVAL HISTORY:   [name] [name] returns for unscheduled visit due to numerous trips to the ER, the most recent to [facility] ER for 12 hours last night. He was taken by ambulance with a systolic blood pressure in the 60's - along with hypotension, he had extreme weakness and fatigue. He has had numerous loose stools with copious amounts of fluid from his ileostomy (wife reports emptying it at least 5 times a day). He denies any rash, and has not had nausea or vomiting, no mucositis or skin rash, but has had absolutely no appetite and no desire to drink oral fluids. He reports early satiety.  He has had intermittent severe mid-epigastric pain. We called in Megace for him but he was not able to fill it due to insurance requirement for pre-authorization. He has not tried any over-the counter anti-diarrhea agents.   Objective:  Vital signs in last 24 hours:  Blood pressure 100/64, pulse 73, temperature 96.4 F (35.8 C), temperature source Oral, resp. rate 18, height 6\' 1"  (1.854 m), weight 164 lb 9.6 oz (74.662 kg).    HEENT: Neck without mass, EOMI. Resp: Clear bilaterally Cardio: Regular rate and rhythm GI: Low abdominal ileostomy with largely liquid stool, no hepatomegaly, no apparent ascites, nodularity at the left abdomen near a trocar site-no other mass Vascular: no edema noted      Lab Results:  Lab Results  Component Value Date   WBC 6.1 07/14/2012   HGB 11.7* 07/14/2012   HCT 33.0* 07/14/2012   PLT 318 07/14/2012   GLUCOSE 132* 07/14/2012   CHOL 100 04/18/2012   TRIG 61 04/18/2012   HDL 60 04/18/2012   LDLDIRECT 84 08/02/2006   LDLCALC 28 04/18/2012   ALT 10 07/14/2012   AST 18 07/14/2012   NA 132* 07/14/2012   K 4.4 07/14/2012   CL 99 07/14/2012   CREATININE 1.13 07/14/2012   BUN 23 07/14/2012   CO2 23 07/14/2012   INR 1.06 04/17/2012   HGBA1C 5.7* 04/18/2012       Medications: I have reviewed the patient's current  medications.  Assessment/Plan: 1. Metastatic gastric cancer-adenocarcinoma documented on the biopsy of the stomach and transverse colon. Exploratory laparoscopy 06/13/2012 with no tumor seen, positive cytology for metastatic adenocarcinoma. Biopsy of a left scalene nodal 06/13/2012 confirmed metastatic adenocarcinoma. Received first cycle CAPOX last Thursday on 07/11/2012. He started Xeloda but held it starting 2 days ago due to hypotension, diarrhea and dehydration. We will leave it on hold for now.   2. Partial colonic obstruction secondary to tumor invading the transverse colon-status post a palliative diverting distal ileostomy 06/10/2012  3. History of atrial fibrillation-maintained on xarelto  4. Anorexia/weight loss secondary to metastatic gastric cancer and the colonic obstruction. He has early satiety. Megace was prescribed but requires prior authorization from insurance, and he is also interested in trying Marinol. He agreed with the plan to address diarrhea and dehydration  4. Dehydration - due to copious output from ileostomy, poor oral intake and diarrhea. We will give him IV fluids in office tomorrow and have recommended he take both Imodium  - 1 QID and Metamucil powder twice a day. If diarrhea persists, he is to increase Imodium to 2 pills four times a day. He receives his port-a-cath on Thursday.   5. Subxiphoid episodic pain - he received morphine in the ER and was discharged with a prescription for Norco. He was encouraged to  take this and let us know if his pain is unrelieved.   Disposition: He received his first cycle of CAPOX on 07/11/2012. He stopped taking his Xeloda 2 days ago when he went to [facility] ER for treatment of dehydration and sub xyphoid pain. He unfortunately also was seen last night in the [facility] ER and received IV fluids.   [name] [name] will take pain medicine and imodium and metamucil. He will receive IV fluids here tomorrow and a Port-a-cath will be placed on  Thursday. He will continue to hold the Xeloda. We will see him back in one week to assess his oral intake, weight loss and hydration status. Hopefully he can remain out of the ER. He knows to call us for additional concerns including uncontrolled pain.  He was strongly encouraged to force oral fluids and eat as he is able. If not considerably better next week, we will add a course of prednisone or decadron, and the marinol and/or megace.    Jobie Quaker, NP-C   07/16/2012  3:29 PM

## 2012-07-17 ENCOUNTER — Ambulatory Visit (HOSPITAL_BASED_OUTPATIENT_CLINIC_OR_DEPARTMENT_OTHER): Payer: Medicare Other

## 2012-07-17 ENCOUNTER — Other Ambulatory Visit: Payer: Self-pay | Admitting: *Deleted

## 2012-07-17 ENCOUNTER — Encounter: Payer: Self-pay | Admitting: Oncology

## 2012-07-17 ENCOUNTER — Telehealth: Payer: Self-pay | Admitting: Certified Registered Nurse Anesthetist

## 2012-07-17 VITALS — BP 100/62 | HR 74 | Temp 97.5°F

## 2012-07-17 DIAGNOSIS — C169 Malignant neoplasm of stomach, unspecified: Secondary | ICD-10-CM

## 2012-07-17 DIAGNOSIS — E860 Dehydration: Secondary | ICD-10-CM

## 2012-07-17 MED ORDER — SODIUM CHLORIDE 0.9 % IV SOLN
1000.0000 mL | Freq: Once | INTRAVENOUS | Status: AC
  Administered 2012-07-17: 1000 mL via INTRAVENOUS

## 2012-07-17 NOTE — Progress Notes (Signed)
[employer] approved megestrol 40mg /ml susp from 07/16/12-07/16/13.

## 2012-07-17 NOTE — Patient Instructions (Addendum)
Dehydration, Adult Dehydration is when you lose more fluids from the body than you take in. Vital organs like the kidneys, brain, and heart cannot function without a proper amount of fluids and salt. Any loss of fluids from the body can cause dehydration.  CAUSES   Vomiting.  Diarrhea.  Excessive sweating.  Excessive urine output.  Fever. SYMPTOMS  Mild dehydration  Thirst.  Dry lips.  Slightly dry mouth. Moderate dehydration  Very dry mouth.  Sunken eyes.  Skin does not bounce back quickly when lightly pinched and released.  Dark urine and decreased urine production.  Decreased tear production.  Headache. Severe dehydration  Very dry mouth.  Extreme thirst.  Rapid, weak pulse (more than 100 beats per minute at rest).  Cold hands and feet.  Not able to sweat in spite of heat and temperature.  Rapid breathing.  Blue lips.  Confusion and lethargy.  Difficulty being awakened.  Minimal urine production.  No tears. DIAGNOSIS  Your caregiver will diagnose dehydration based on your symptoms and your exam. Blood and urine tests will help confirm the diagnosis. The diagnostic evaluation should also identify the cause of dehydration. TREATMENT  Treatment of mild or moderate dehydration can often be done at home by increasing the amount of fluids that you drink. It is best to drink small amounts of fluid more often. Drinking too much at one time can make vomiting worse. Refer to the home care instructions below. Severe dehydration needs to be treated at the hospital where you will probably be given intravenous (IV) fluids that contain water and electrolytes. HOME CARE INSTRUCTIONS   Ask your caregiver about specific rehydration instructions.  Drink enough fluids to keep your urine clear or pale yellow.  Drink small amounts frequently if you have nausea and vomiting.  Eat as you normally do.  Avoid:  Foods or drinks high in sugar.  Carbonated  drinks.  Juice.  Extremely hot or cold fluids.  Drinks with caffeine.  Fatty, greasy foods.  Alcohol.  Tobacco.  Overeating.  Gelatin desserts.  Wash your hands well to avoid spreading bacteria and viruses.  Only take over-the-counter or prescription medicines for pain, discomfort, or fever as directed by your caregiver.  Ask your caregiver if you should continue all prescribed and over-the-counter medicines.  Keep all follow-up appointments with your caregiver. SEEK MEDICAL CARE IF:  You have abdominal pain and it increases or stays in one area (localizes).  You have a rash, stiff neck, or severe headache.  You are irritable, sleepy, or difficult to awaken.  You are weak, dizzy, or extremely thirsty. SEEK IMMEDIATE MEDICAL CARE IF:   You are unable to keep fluids down or you get worse despite treatment.  You have frequent episodes of vomiting or diarrhea.  You have blood or green matter (bile) in your vomit.  You have blood in your stool or your stool looks black and tarry.  You have not urinated in 6 to 8 hours, or you have only urinated a small amount of very dark urine.  You have a fever.  You faint. MAKE SURE YOU:   Understand these instructions.  Will watch your condition.  Will get help right away if you are not doing well or get worse. Document Released: 03/06/2005 Document Revised: 05/29/2011 Document Reviewed: 10/24/2010 ExitCare Patient Information 2013 [employer].  

## 2012-07-17 NOTE — Telephone Encounter (Signed)
Call received from [facility] looking for contact for this pt.  Received fax from [facility] without contact information. She had  left messages this morning for MD but have not heard back yet.   She is inquiring about Megace prescription. Per Anise Salvo, if I or anyone from [facility] cannot help her within 15 minutes, she would send this request to the medical director.  This may cause denial.  I contacted Axel Filler in Managed care and explained this above call.  Reviewed copy of fax sent  By  [facility], and  her direct extension was on the paperwork as contact.  Ebony contacted [employer] via phone to follow up.  HL

## 2012-07-18 ENCOUNTER — Other Ambulatory Visit: Payer: Self-pay | Admitting: *Deleted

## 2012-07-18 ENCOUNTER — Telehealth: Payer: Self-pay | Admitting: *Deleted

## 2012-07-18 ENCOUNTER — Telehealth: Payer: Self-pay

## 2012-07-18 ENCOUNTER — Encounter (HOSPITAL_BASED_OUTPATIENT_CLINIC_OR_DEPARTMENT_OTHER): Admission: RE | Payer: Self-pay | Source: Ambulatory Visit

## 2012-07-18 ENCOUNTER — Telehealth: Payer: Self-pay | Admitting: General Surgery

## 2012-07-18 ENCOUNTER — Ambulatory Visit (HOSPITAL_BASED_OUTPATIENT_CLINIC_OR_DEPARTMENT_OTHER): Admission: RE | Admit: 2012-07-18 | Payer: Medicare Other | Source: Ambulatory Visit | Admitting: General Surgery

## 2012-07-18 DIAGNOSIS — C169 Malignant neoplasm of stomach, unspecified: Secondary | ICD-10-CM

## 2012-07-18 SURGERY — INSERTION, TUNNELED CENTRAL VENOUS DEVICE, WITH PORT
Anesthesia: General

## 2012-07-18 NOTE — Telephone Encounter (Signed)
Spoke with pts wife and advised her of appt for 5/2. Voiced understanding and knows to call with any questions. TMB

## 2012-07-18 NOTE — Telephone Encounter (Signed)
Wife called to report patient having a lot of diarrhea from ileostomy and also has had diarrhea rectally also and has not had stool here since ostomy placed (loop ileostomy). Have been taking Imodium #1 tablet about every 4 hours prn-. NO nausea and is drinking Gatorade. Minimal urine output this morning. They notified Dr. Janee Morn today and have cancelled the [facility] placement. Asking if there is anything else to do? Instructed her to continue to push the po fluids and increase Imodium to #2 qid as needed. Repeat dose now. Add the metamucil as suggested by dietician and MD to try to bulk up stool. Do not resume Xeloda. Will arrange for IV fluids at [facility] tomorrow to help him build up for weekend. POF to scheduler.

## 2012-07-18 NOTE — Telephone Encounter (Signed)
Occult on the surgery this morning reporting watery diarrhea. He is status post infusion at the cancer center yesterday. Due to significant dehydration, I will cancel his Port-A-Cath insertion today.I advised her to have him start drinking Gatorade. Several bottles. I advised her to watch his urine output and the character of it in regards to its color. I advised her to return to [facility] long emergency room for evaluation and hydration if his urine output does not remain with adequate volume and not concentrated. She expressed understanding for the plan. I also notified Dr Truett Perna.

## 2012-07-18 NOTE — Progress Notes (Signed)
Wife phoned Pre-op this am scheduled for PAC insertion. Patient received fluids yesterday at cancer center. Wife reports patient is having diarrhea this am and feels weak, has been NPO for surgery. Instructed wife to give patient fluids and call MD. Dr Ivin Booty and Dr Janee Morn made aware of situation.

## 2012-07-19 ENCOUNTER — Other Ambulatory Visit: Payer: Self-pay | Admitting: *Deleted

## 2012-07-19 ENCOUNTER — Encounter (HOSPITAL_COMMUNITY): Payer: Self-pay | Admitting: Emergency Medicine

## 2012-07-19 ENCOUNTER — Telehealth: Payer: Self-pay | Admitting: *Deleted

## 2012-07-19 ENCOUNTER — Emergency Department (HOSPITAL_COMMUNITY): Payer: Medicare Other

## 2012-07-19 ENCOUNTER — Emergency Department (HOSPITAL_COMMUNITY)
Admission: EM | Admit: 2012-07-19 | Discharge: 2012-07-19 | Disposition: A | Discharge status: Home / Self Care | Payer: Medicare Other | Attending: Emergency Medicine | Admitting: Emergency Medicine

## 2012-07-19 ENCOUNTER — Ambulatory Visit: Payer: Medicare Other

## 2012-07-19 DIAGNOSIS — Z9849 Cataract extraction status, unspecified eye: Secondary | ICD-10-CM | POA: Insufficient documentation

## 2012-07-19 DIAGNOSIS — Z932 Ileostomy status: Secondary | ICD-10-CM | POA: Insufficient documentation

## 2012-07-19 DIAGNOSIS — M1990 Unspecified osteoarthritis, unspecified site: Secondary | ICD-10-CM | POA: Insufficient documentation

## 2012-07-19 DIAGNOSIS — F411 Generalized anxiety disorder: Secondary | ICD-10-CM | POA: Insufficient documentation

## 2012-07-19 DIAGNOSIS — Z8679 Personal history of other diseases of the circulatory system: Secondary | ICD-10-CM | POA: Insufficient documentation

## 2012-07-19 DIAGNOSIS — Z8673 Personal history of transient ischemic attack (TIA), and cerebral infarction without residual deficits: Secondary | ICD-10-CM | POA: Insufficient documentation

## 2012-07-19 DIAGNOSIS — Z791 Long term (current) use of non-steroidal anti-inflammatories (NSAID): Secondary | ICD-10-CM | POA: Insufficient documentation

## 2012-07-19 DIAGNOSIS — Z87891 Personal history of nicotine dependence: Secondary | ICD-10-CM | POA: Insufficient documentation

## 2012-07-19 DIAGNOSIS — R197 Diarrhea, unspecified: Secondary | ICD-10-CM

## 2012-07-19 DIAGNOSIS — C169 Malignant neoplasm of stomach, unspecified: Secondary | ICD-10-CM

## 2012-07-19 DIAGNOSIS — Z79899 Other long term (current) drug therapy: Secondary | ICD-10-CM | POA: Insufficient documentation

## 2012-07-19 DIAGNOSIS — B0221 Postherpetic geniculate ganglionitis: Secondary | ICD-10-CM | POA: Insufficient documentation

## 2012-07-19 DIAGNOSIS — Z8669 Personal history of other diseases of the nervous system and sense organs: Secondary | ICD-10-CM | POA: Insufficient documentation

## 2012-07-19 DIAGNOSIS — R110 Nausea: Secondary | ICD-10-CM | POA: Insufficient documentation

## 2012-07-19 DIAGNOSIS — E860 Dehydration: Secondary | ICD-10-CM

## 2012-07-19 DIAGNOSIS — R531 Weakness: Secondary | ICD-10-CM

## 2012-07-19 DIAGNOSIS — I10 Essential (primary) hypertension: Secondary | ICD-10-CM | POA: Insufficient documentation

## 2012-07-19 DIAGNOSIS — R5381 Other malaise: Secondary | ICD-10-CM | POA: Insufficient documentation

## 2012-07-19 LAB — COMPREHENSIVE METABOLIC PANEL
AST: 116 U/L — ABNORMAL HIGH (ref 0–37)
Albumin: 2.9 g/dL — ABNORMAL LOW (ref 3.5–5.2)
Chloride: 102 mEq/L (ref 96–112)
Creatinine, Ser: 1.02 mg/dL (ref 0.50–1.35)
Potassium: 4.2 mEq/L (ref 3.5–5.1)
Total Bilirubin: 0.5 mg/dL (ref 0.3–1.2)
Total Protein: 5.8 g/dL — ABNORMAL LOW (ref 6.0–8.3)

## 2012-07-19 LAB — URINE MICROSCOPIC-ADD ON

## 2012-07-19 LAB — URINALYSIS, ROUTINE W REFLEX MICROSCOPIC
Glucose, UA: NEGATIVE mg/dL
Ketones, ur: NEGATIVE mg/dL
Leukocytes, UA: NEGATIVE
Nitrite: NEGATIVE
Specific Gravity, Urine: 1.025 (ref 1.005–1.030)
pH: 6 (ref 5.0–8.0)

## 2012-07-19 LAB — CBC WITH DIFFERENTIAL/PLATELET
Basophils Absolute: 0 10*3/uL (ref 0.0–0.1)
Basophils Relative: 0 % (ref 0–1)
Eosinophils Absolute: 0.1 10*3/uL (ref 0.0–0.7)
MCH: 31.3 pg (ref 26.0–34.0)
MCHC: 33.4 g/dL (ref 30.0–36.0)
Neutro Abs: 6.9 10*3/uL (ref 1.7–7.7)
Neutrophils Relative %: 79 % — ABNORMAL HIGH (ref 43–77)
RDW: 14.9 % (ref 11.5–15.5)

## 2012-07-19 LAB — LIPASE, BLOOD: Lipase: 27 U/L (ref 11–59)

## 2012-07-19 LAB — CG4 I-STAT (LACTIC ACID): Lactic Acid, Venous: 0.7 mmol/L (ref 0.5–2.2)

## 2012-07-19 MED ORDER — SODIUM CHLORIDE 0.9 % IV SOLN
INTRAVENOUS | Status: DC
  Administered 2012-07-19: 10:00:00 via INTRAVENOUS

## 2012-07-19 MED ORDER — PAREGORIC 2 MG/5ML PO TINC: 5.0000 mL | Freq: Four times a day (QID) | ORAL | Status: DC | PRN

## 2012-07-19 MED ORDER — SODIUM CHLORIDE 0.9 % IV BOLUS (SEPSIS): 1000.0000 mL | Freq: Once | INTRAVENOUS | Status: DC

## 2012-07-19 MED ORDER — LIDOCAINE HCL 1 % IJ SOLN
INTRAMUSCULAR | Status: AC
  Filled 2012-07-19: qty 20

## 2012-07-19 MED ORDER — SODIUM CHLORIDE 0.9 % IV SOLN: Freq: Once | INTRAVENOUS | Status: DC

## 2012-07-19 NOTE — ED Provider Notes (Signed)
History     CSN: [id number]  Arrival date & time 07/19/12  4782   First MD Initiated Contact with Patient 07/19/12 [phone]      Chief Complaint  Patient presents with  . Diarrhea     HPI  Patient presents with concerns of ongoing diarrhea and weakness. The patient has been seen here previously for similar concerns, including 2 days ago, and 40s ago. He and his wife state that over the past day the patient has been persistently generally weak, with ongoing frequent diarrhea. There is new rectal diarrhea.  This is notable since the patient had an ileostomy placed approximately 2 months ago. Since onset of his diarrhea several days ago, the patient has had no improvement following initiation of therapy with loperamide and drinking substantial quantities of Gatorade. No significant abdominal pain, nausea, vomiting, fever, chills, chest pain. Today, just prior to arrival the patient episode of near-syncope, prompting evaluation.  Past Medical History  Diagnosis Date  . Ramsay Hunt auricular syndrome   . HTN (hypertension)   . Osteoarthritis   . Dysrhythmia 04/17/12  . Anxiety   . Stroke   . H/O Bell's palsy at age 32  . Atrial flutter   . Atrial fibrillation   . Ileostomy in place 06/14/2012    Past Surgical History  Procedure Laterality Date  . Hernia repair    . Vasectomy    . Prostate biopsies      per Vonita Moss  . Cataract extraction      SE eye  . Colonoscopy N/A 06/03/2012    Procedure: COLONOSCOPY;  Surgeon: Florencia Reasons, MD;  Location: [facility] ENDOSCOPY;  Service: Endoscopy;  Laterality: N/A;  . Esophagogastroduodenoscopy N/A 06/03/2012    Procedure: ESOPHAGOGASTRODUODENOSCOPY (EGD);  Surgeon: Florencia Reasons, MD;  Location: [facility] ENDOSCOPY;  Service: Endoscopy;  Laterality: N/A;  . Laparoscopy N/A 06/13/2012    Procedure: EXPLORATORY LAPAROSCOPY ;  Surgeon: Liz Malady, MD;  Location: [facility] OR;  Service: General;  Laterality: N/A;  . Diverting ileostomy N/A 06/13/2012   Procedure: DIVERTING LOOP ILEOSTOMY;  Surgeon: Liz Malady, MD;  Location: [facility] OR;  Service: General;  Laterality: N/A;  . Lymph node biopsy Left 06/13/2012    Procedure: LYMPH NODE BIOPSY;  Surgeon: Liz Malady, MD;  Location: [facility] OR;  Service: General;  Laterality: Left;  Scalene Lymph Node Biopsy    Family History  Problem Relation Age of Onset  . Cancer Father   . Diabetes Father     History  Substance Use Topics  . Smoking status: Former Smoker    Quit date: 06/22/1984  . Smokeless tobacco: Never Used  . Alcohol Use: 3.5 oz/week    7 drink(s) per week     Comment: 1 glass of wine a day      Review of Systems  Constitutional: Negative for fever.  HENT: Negative.   Respiratory: Negative for chest tightness and shortness of breath.   Cardiovascular: Negative for chest pain.  Gastrointestinal: Positive for nausea and diarrhea. Negative for vomiting.  Endocrine: Negative.   Genitourinary: Negative.   Musculoskeletal: Negative.   Skin: Negative for rash.  Neurological: Negative.     Allergies  Penicillins  Home Medications   Current Outpatient Rx  Name  Route  Sig  Dispense  Refill  . acetaminophen (TYLENOL) 500 MG tablet   Oral   Take 1,000 mg by mouth once.         . capecitabine (XELODA) 500 MG tablet  Oral   Take 1,500 mg by mouth 2 (two) times daily after a meal. X 14 days, 7 days rest Start on 07/11/12         . diltiazem (CARDIZEM CD) 120 MG 24 hr capsule   Oral   Take 1 capsule (120 mg total) by mouth every morning. For atrial fibrillation   30 capsule   1   . ferrous sulfate 325 (65 FE) MG tablet   Oral   Take 1 tablet (325 mg total) by mouth 2 (two) times daily with a meal.   60 tablet   1   . HYDROcodone-acetaminophen (NORCO) 5-325 MG per tablet   Oral   Take 1 tablet by mouth every 4 (four) hours as needed for pain.   30 tablet   0   . megestrol (MEGACE ORAL) 40 MG/ML suspension   Oral   Take 5 mLs (200 mg total) by mouth 2  (two) times daily.   300 mL   2   . Multiple Vitamins-Minerals (PRESERVISION AREDS PO)   Oral   Take 1 tablet by mouth 2 (two) times daily.         Marland Kitchen omeprazole (PRILOSEC) 20 MG capsule   Oral   Take 20 mg by mouth 2 (two) times daily.          . ondansetron (ZOFRAN) 4 MG tablet   Oral   Take 4 mg by mouth every 8 (eight) hours as needed for nausea.         . prochlorperazine (COMPAZINE) 10 MG tablet   Oral   Take 10 mg by mouth every 6 (six) hours as needed (nausea).         . Rivaroxaban (XARELTO) 20 MG TABS   Oral   Take 20 mg by mouth every evening.         . sucralfate (CARAFATE) 1 G tablet   Oral   Take 1 g by mouth 3 (three) times daily before meals.            BP 114/68  Pulse 72  Temp(Src) 97.6 F (36.4 C) (Oral)  Resp 18  SpO2 99%  Physical Exam  Nursing note and vitals reviewed. Constitutional: He is oriented to person, place, and time. He appears well-developed. No distress.  HENT:  Head: Normocephalic and atraumatic.  Eyes: Conjunctivae and EOM are normal.  Cardiovascular: Normal rate and regular rhythm.   Pulmonary/Chest: Effort normal. No stridor. No respiratory distress.  Abdominal: He exhibits no distension.  Ileostomy tube with no surrounding erythema, drainage. The abdomen is soft, with appreciable bowel sounds throughout.  Musculoskeletal: He exhibits no edema.  Neurological: He is alert and oriented to person, place, and time. No cranial nerve deficit. He exhibits normal muscle tone. Coordination normal.  Skin: Skin is warm and dry. No rash noted. No erythema.  Psychiatric: He has a normal mood and affect. His behavior is normal.    ED Course  Procedures (including critical care time)  Labs Reviewed  CBC WITH DIFFERENTIAL  COMPREHENSIVE METABOLIC PANEL  LIPASE, BLOOD  URINALYSIS, ROUTINE W REFLEX MICROSCOPIC   No results found.   No diagnosis found.  O2- 99%ra, normal  I reviewed the patient's chart, including the  recent CT scan and two evaluations.  5:37 AM Patient appears calm - cuddling in bed with wife.  6:35 AM Patient and wife informed of results, including slight elevation in the transaminases. He continues to appear calm, vital signs are stable.  He states  that he feels significantly better. We discussed the need for followup with both oncology and possibly surgery for further evaluation of the patient's rectal diarrhea.   MDM  This patient presents with concern of ongoing diarrhea, weakness.  On exam the patient is awake alert, appropriately interactive, neurologically intact with unremarkable vital signs.  The patient's ongoing cancer therapy there suspicion for either original disease or medication complications.  The patient improved substantially following IV fluids, and although his labs are mildly different, given this improvement, the absence of any diarrhea while here, he is appropriate for discharge with continued evaluation and management as an out patient.  The patient has capacity to follow up as needed.        Gerhard Munch, MD 07/19/12 [phone]

## 2012-07-19 NOTE — Procedures (Signed)
Successful placement of right basilic vein approach 41 cm dual lumen PICC line with tip at the superior caval-atrial junction.  The PICC line is ready for immediate use. 

## 2012-07-19 NOTE — Discharge Instructions (Signed)
As discussed, there are several slight abnormalities in your blood work done today, but overall, with stable vital signs, and your improvement, you have been discharged to followup with your primary care team.  Please make sure to speak with your physicians today to ensure appropriate ongoing care.  If you develop any new, or concerning changes in your condition, please make sure to return here for further evaluation and management.

## 2012-07-19 NOTE — Progress Notes (Signed)
IP PROGRESS NOTE  Subjective:   He presented to the emergency room early this morning with presyncope. He feels "weak ". He has received intravenous fluids in the emergency room 3 days this week. He began Imodium and Metamucil when we saw him on 07/16/2012. The liquid stool has persisted despite these medications. He also has loose stool per rectum. The ileostomy bag is empty approximately 5 times per day. He has been drinking Gatorade. The upper abdominal pain has resolved. No fever. No complaint aside from generalized weakness and "dizziness "when he stands.  Objective: Vital signs in last 24 hours: Blood pressure 104/64, pulse 64, temperature 98.1 F (36.7 C), temperature source Oral, resp. rate 18, SpO2 99.00%.  Intake/Output from previous day:    Physical Exam:  HEENT: The mucous membranes are dry. No thrush the Lungs: Good air movement bilaterally, and inspiratory rhonchi at the bases, no respiratory distress Cardiac: Regular rate and rhythm Abdomen: Nontender, no hepatomegaly, ileostomy with dark liquid stool Extremities: No edema Vascular: The skin turgor is diminished   Lab Results:  Recent Labs  07/19/12 0435  WBC 8.8  HGB 10.1*  HCT 30.2*  PLT 295    BMET  Recent Labs  07/19/12 0435  NA 132*  K 4.2  CL 102  CO2 24  GLUCOSE 108*  BUN 20  CREATININE 1.02  CALCIUM 9.7    Studies/Results: No results found.  Medications: I have reviewed the patient's current medications.  Assessment/Plan:  1. Metastatic gastric cancer-adenocarcinoma documented on the biopsy of the stomach and transverse colon. Exploratory laparoscopy 06/13/2012 with no tumor seen, positive cytology for metastatic adenocarcinoma. Biopsy of a left scalene nodal 06/13/2012 confirmed metastatic adenocarcinoma. Received first cycle CAPOX last Thursday on 07/11/2012. He started Xeloda but held it starting 2 days ago due to hypotension, diarrhea and dehydration. The Xeloda remains on hold.  2.  Partial colonic obstruction secondary to tumor invading the transverse colon-status post a palliative diverting distal ileostomy 06/10/2012  3. History of atrial fibrillation-maintained on xarelto  4. Anorexia/weight loss secondary to metastatic gastric cancer and the colonic obstruction. He has early satiety. Megace was prescribed but requires prior authorization from insurance. 5. Dehydration - due to copious output from ileostomy, poor oral intake and diarrhea. Not improved with Imodium and Metamucil. 6. Subxiphoid episodic pain on 07/16/2012 -resolved  [name] [name] has been seen in the emergency room 3 days this week with dehydration. The dehydration appears to be related to high output from the ileostomy. It would be unusual for a few doses of Xeloda to calls this degree of diarrhea. We discussed hospital admission versus home IV fluids. The plan is to pursue placement of a PICC for home IV fluids. I discussed the case with Dr. Janee Morn.  Plan: 1. Continue Imodium and add paregoric 2. PICC and home intravenous fluids 3. Check stool for the C. difficile toxin 4. Hold Xeloda  He will return for an office visit on 07/22/2012 as scheduled.    LOS: 0 days   [name] [name]  07/19/2012, 10:03 AM

## 2012-07-19 NOTE — Progress Notes (Signed)
[facility] ED CM received call from [facility] in [facility] in regards to unavailability of NS. Dr. Truett Perna notified TO obtained and placed in EPIC to change IVF to D5NS, message left on voicemail for Pam.

## 2012-07-19 NOTE — ED Notes (Signed)
Patient received PICC line, intact.

## 2012-07-19 NOTE — ED Notes (Signed)
Unable to draw labs off of line, phlebotomy aware of need to draw labs.

## 2012-07-19 NOTE — ED Notes (Signed)
ZOX:[id number]<AV> Expected date:<BR> Expected time:<BR> Means of arrival:<BR> Comments:<BR> EMS/stomach and colon cancer/diarrhea

## 2012-07-19 NOTE — ED Notes (Signed)
Patient transported to IR 

## 2012-07-19 NOTE — Telephone Encounter (Signed)
Notified wife that Paregoric is in limited supply, but [facility] does carry this. Will send script there.

## 2012-07-19 NOTE — Progress Notes (Signed)
[facility] ED CM  noted CM consult. Spoke with pt in regards to EDP recommendation for 2 liters of NS daily at home. Pt is currently active with [facility] (RN[facility]PT[facility]) Pt choice  is [facility] services  wife being primary care giver. Pt confirms that he has at home walker/w/c/cane and denies any need for additional DME's. Obtained orders from EDP for [facility] needs and services. Notified Jaime at [facility]. In regards to additional services ordered. Notified and updated patient's RN. Explained to patient he will receive a call from [facility].to set up services.

## 2012-07-19 NOTE — ED Notes (Signed)
Per EMS, pt from home, being treated for stomach and colon cancer here.  Pt has had diarrhea for the past week to the point of being orthostatic hypotensive.  Pt unable to walk at times, pt was found to have BP 70/50 but was 110/60 on the monitor.  Pt hx of a fib, but not currently in a fib.  Pt alert and oriented.  8mg  Zofran given en route, no other complaints at this time.

## 2012-07-22 ENCOUNTER — Other Ambulatory Visit: Payer: Self-pay

## 2012-07-22 ENCOUNTER — Telehealth: Payer: Self-pay | Admitting: Oncology

## 2012-07-22 ENCOUNTER — Ambulatory Visit (HOSPITAL_BASED_OUTPATIENT_CLINIC_OR_DEPARTMENT_OTHER): Payer: Medicare Other | Admitting: Oncology

## 2012-07-22 ENCOUNTER — Other Ambulatory Visit: Payer: Self-pay | Admitting: *Deleted

## 2012-07-22 VITALS — BP 108/66 | HR 94 | Temp 96.0°F | Resp 17 | Ht 73.0 in | Wt 175.5 lb

## 2012-07-22 DIAGNOSIS — E860 Dehydration: Secondary | ICD-10-CM

## 2012-07-22 DIAGNOSIS — C785 Secondary malignant neoplasm of large intestine and rectum: Secondary | ICD-10-CM

## 2012-07-22 DIAGNOSIS — I4891 Unspecified atrial fibrillation: Secondary | ICD-10-CM

## 2012-07-22 DIAGNOSIS — C169 Malignant neoplasm of stomach, unspecified: Secondary | ICD-10-CM

## 2012-07-22 MED ORDER — METOPROLOL SUCCINATE ER 25 MG PO TB24: 25.0000 mg | ORAL_TABLET | Freq: Every day | ORAL | Status: DC

## 2012-07-22 NOTE — Progress Notes (Signed)
    Cancer Center    OFFICE PROGRESS NOTE   INTERVAL HISTORY:   He returns as scheduled. I saw him in the emergency room on 07/19/2012. He appeared dehydrated. 8 PICC was placed and he has been receiving 2 L of D5 normal saline daily. He was started on paregoric for high output from the ileostomy site. He reports a good appetite. He continues to feel "weak ". No further presyncope. He had an episode of "reflux "last night. The subxiphoid discomfort has resolved. He has noted a significant decrease in stool output with paregoric. The stool became "thicker "when he took paregoric over the weekend.  Objective:  Vital signs in last 24 hours:  Blood pressure 108/66, pulse 94, temperature 96 F (35.6 C), temperature source Oral, resp. rate 17, height 6\' 1"  (1.854 m), weight 175 lb 8 oz (79.606 kg).    HEENT: No thrush or ulcers Resp: End inspiratory rhonchi at the left posterior base, no respiratory distress Cardio: Irregular, tachycardia GI: Nontender, no hepatomegaly Vascular: Pitting edema at the right greater than left lower leg    Portacath/PICC-without erythema-resolving ecchymosis at the right PICC  Lab Results:  Lab Results  Component Value Date   WBC 8.8 07/19/2012   HGB 10.1* 07/19/2012   HCT 30.2* 07/19/2012   MCV 93.5 07/19/2012   PLT 295 07/19/2012   Stool C. difficile toxin on 07/19/2012-negative   Medications: I have reviewed the patient's current medications.  Assessment/Plan: 1. Metastatic gastric cancer-adenocarcinoma documented on the biopsy of the stomach and transverse colon. Exploratory laparoscopy 06/13/2012 with no tumor seen, positive cytology for metastatic adenocarcinoma. Biopsy of a left scalene nodal 06/13/2012 confirmed metastatic adenocarcinoma. Received first cycle CAPOX last Thursday on 07/11/2012. He started Xeloda but held it after 2 days due to hypotension, diarrhea and dehydration. The Xeloda remains on hold.  2. Partial colonic obstruction  secondary to tumor invading the transverse colon-status post a palliative diverting distal ileostomy 06/10/2012  3. History of atrial fibrillation-maintained on xarelto , he is tachycardic today 4. Anorexia/weight loss secondary to metastatic gastric cancer and the colonic obstruction. Now taking Megace with an improved appetite  5. Dehydration - due to copious output from ileostomy, poor oral intake and diarrhea. Not improved with Imodium and Metamucil. He is now taking paregoric and on intravenous fluids. The diarrhea improved when he began paregoric.  6. Subxiphoid episodic pain on 07/16/2012 -resolved   Disposition:  The dehydration has improved with home IV fluids and paregoric. He will continue paregoric with the plan to decrease the IV fluids 1 L daily on 07/24/2012 if the stool output remains improved. He will resume Xeloda today.  Mr. Casto continues to feel "weak ". This may be in part related to tachycardia. We will check an EKG and consult with cardiology regarding his medical regimen. He is now taking Cardizem daily. I have a low clinical suspicion for a pulmonary embolism.  Mr. Mutchler will return for an office visit as scheduled on 08/01/2012.   Thornton Papas, MD  07/22/2012  10:51 AM

## 2012-07-24 ENCOUNTER — Telehealth: Payer: Self-pay | Admitting: *Deleted

## 2012-07-24 ENCOUNTER — Emergency Department (HOSPITAL_COMMUNITY): Payer: Medicare Other

## 2012-07-24 ENCOUNTER — Observation Stay (HOSPITAL_COMMUNITY)
Admission: EM | Admit: 2012-07-24 | Discharge: 2012-07-26 | Disposition: A | Discharge status: Home / Self Care | Payer: Medicare Other | Attending: Internal Medicine | Admitting: Internal Medicine

## 2012-07-24 ENCOUNTER — Other Ambulatory Visit: Payer: Self-pay

## 2012-07-24 ENCOUNTER — Encounter (HOSPITAL_COMMUNITY): Payer: Self-pay | Admitting: Physical Medicine and Rehabilitation

## 2012-07-24 DIAGNOSIS — J189 Pneumonia, unspecified organism: Secondary | ICD-10-CM

## 2012-07-24 DIAGNOSIS — I639 Cerebral infarction, unspecified: Secondary | ICD-10-CM

## 2012-07-24 DIAGNOSIS — G459 Transient cerebral ischemic attack, unspecified: Principal | ICD-10-CM

## 2012-07-24 DIAGNOSIS — M7750 Other enthesopathy of unspecified foot: Secondary | ICD-10-CM

## 2012-07-24 DIAGNOSIS — K5730 Diverticulosis of large intestine without perforation or abscess without bleeding: Secondary | ICD-10-CM

## 2012-07-24 DIAGNOSIS — K56609 Unspecified intestinal obstruction, unspecified as to partial versus complete obstruction: Secondary | ICD-10-CM

## 2012-07-24 DIAGNOSIS — I4892 Unspecified atrial flutter: Secondary | ICD-10-CM | POA: Insufficient documentation

## 2012-07-24 DIAGNOSIS — G609 Hereditary and idiopathic neuropathy, unspecified: Secondary | ICD-10-CM

## 2012-07-24 DIAGNOSIS — R140 Abdominal distension (gaseous): Secondary | ICD-10-CM

## 2012-07-24 DIAGNOSIS — R630 Anorexia: Secondary | ICD-10-CM

## 2012-07-24 DIAGNOSIS — E876 Hypokalemia: Secondary | ICD-10-CM

## 2012-07-24 DIAGNOSIS — K922 Gastrointestinal hemorrhage, unspecified: Secondary | ICD-10-CM

## 2012-07-24 DIAGNOSIS — M25562 Pain in left knee: Secondary | ICD-10-CM

## 2012-07-24 DIAGNOSIS — I480 Paroxysmal atrial fibrillation: Secondary | ICD-10-CM

## 2012-07-24 DIAGNOSIS — G9340 Encephalopathy, unspecified: Secondary | ICD-10-CM

## 2012-07-24 DIAGNOSIS — Z8673 Personal history of transient ischemic attack (TIA), and cerebral infarction without residual deficits: Secondary | ICD-10-CM | POA: Insufficient documentation

## 2012-07-24 DIAGNOSIS — M436 Torticollis: Secondary | ICD-10-CM

## 2012-07-24 DIAGNOSIS — F411 Generalized anxiety disorder: Secondary | ICD-10-CM

## 2012-07-24 DIAGNOSIS — K3184 Gastroparesis: Secondary | ICD-10-CM

## 2012-07-24 DIAGNOSIS — F17200 Nicotine dependence, unspecified, uncomplicated: Secondary | ICD-10-CM

## 2012-07-24 DIAGNOSIS — R55 Syncope and collapse: Secondary | ICD-10-CM

## 2012-07-24 DIAGNOSIS — R6881 Early satiety: Secondary | ICD-10-CM

## 2012-07-24 DIAGNOSIS — Z7901 Long term (current) use of anticoagulants: Secondary | ICD-10-CM | POA: Insufficient documentation

## 2012-07-24 DIAGNOSIS — M25462 Effusion, left knee: Secondary | ICD-10-CM

## 2012-07-24 DIAGNOSIS — I5031 Acute diastolic (congestive) heart failure: Secondary | ICD-10-CM

## 2012-07-24 DIAGNOSIS — E860 Dehydration: Secondary | ICD-10-CM

## 2012-07-24 DIAGNOSIS — G510 Bell's palsy: Secondary | ICD-10-CM | POA: Insufficient documentation

## 2012-07-24 DIAGNOSIS — C169 Malignant neoplasm of stomach, unspecified: Secondary | ICD-10-CM

## 2012-07-24 DIAGNOSIS — F5232 Male orgasmic disorder: Secondary | ICD-10-CM

## 2012-07-24 DIAGNOSIS — A499 Bacterial infection, unspecified: Secondary | ICD-10-CM

## 2012-07-24 DIAGNOSIS — R1084 Generalized abdominal pain: Secondary | ICD-10-CM

## 2012-07-24 DIAGNOSIS — Z932 Ileostomy status: Secondary | ICD-10-CM

## 2012-07-24 DIAGNOSIS — N390 Urinary tract infection, site not specified: Secondary | ICD-10-CM

## 2012-07-24 DIAGNOSIS — I10 Essential (primary) hypertension: Secondary | ICD-10-CM

## 2012-07-24 DIAGNOSIS — R0603 Acute respiratory distress: Secondary | ICD-10-CM

## 2012-07-24 DIAGNOSIS — R471 Dysarthria and anarthria: Secondary | ICD-10-CM

## 2012-07-24 DIAGNOSIS — I951 Orthostatic hypotension: Secondary | ICD-10-CM

## 2012-07-24 DIAGNOSIS — I4891 Unspecified atrial fibrillation: Secondary | ICD-10-CM

## 2012-07-24 DIAGNOSIS — R531 Weakness: Secondary | ICD-10-CM

## 2012-07-24 DIAGNOSIS — D638 Anemia in other chronic diseases classified elsewhere: Secondary | ICD-10-CM

## 2012-07-24 DIAGNOSIS — R209 Unspecified disturbances of skin sensation: Secondary | ICD-10-CM

## 2012-07-24 LAB — URINALYSIS, ROUTINE W REFLEX MICROSCOPIC
Bilirubin Urine: NEGATIVE
Glucose, UA: NEGATIVE mg/dL
Ketones, ur: NEGATIVE mg/dL
Leukocytes, UA: NEGATIVE
Nitrite: NEGATIVE
Protein, ur: NEGATIVE mg/dL
pH: 5 (ref 5.0–8.0)

## 2012-07-24 LAB — CBC
MCH: 32.7 pg (ref 26.0–34.0)
MCHC: 36 g/dL (ref 30.0–36.0)
MCV: 90.7 fL (ref 78.0–100.0)
Platelets: 261 10*3/uL (ref 150–400)
RBC: 3.12 MIL/uL — ABNORMAL LOW (ref 4.22–5.81)
RDW: 16 % — ABNORMAL HIGH (ref 11.5–15.5)

## 2012-07-24 LAB — ETHANOL: Alcohol, Ethyl (B): <11 mg/dL (ref 0–11)

## 2012-07-24 LAB — RAPID URINE DRUG SCREEN, HOSP PERFORMED
Amphetamines: NOT DETECTED
Barbiturates: POSITIVE — AB
Benzodiazepines: NOT DETECTED
Cocaine: NOT DETECTED

## 2012-07-24 LAB — POCT I-STAT, CHEM 8
BUN: 13 mg/dL (ref 6–23)
Calcium, Ion: 1.37 mmol/L — ABNORMAL HIGH (ref 1.13–1.30)
Chloride: 107 mEq/L (ref 96–112)
Creatinine, Ser: 0.9 mg/dL (ref 0.50–1.35)
Glucose, Bld: 115 mg/dL — ABNORMAL HIGH (ref 70–99)

## 2012-07-24 LAB — COMPREHENSIVE METABOLIC PANEL
ALT: 41 U/L (ref 0–53)
Albumin: 2.6 g/dL — ABNORMAL LOW (ref 3.5–5.2)
Alkaline Phosphatase: 167 U/L — ABNORMAL HIGH (ref 39–117)
Calcium: 9.5 mg/dL (ref 8.4–10.5)
GFR calc Af Amer: 89 mL/min — ABNORMAL LOW (ref >90)
Glucose, Bld: 116 mg/dL — ABNORMAL HIGH (ref 70–99)
Potassium: 4.3 mEq/L (ref 3.5–5.1)
Sodium: 134 mEq/L — ABNORMAL LOW (ref 135–145)
Total Protein: 5.7 g/dL — ABNORMAL LOW (ref 6.0–8.3)

## 2012-07-24 LAB — DIFFERENTIAL
Basophils Absolute: 0 10*3/uL (ref 0.0–0.1)
Basophils Relative: 0 % (ref 0–1)
Eosinophils Absolute: 0.1 10*3/uL (ref 0.0–0.7)
Eosinophils Relative: 1 % (ref 0–5)
Neutrophils Relative %: 69 % (ref 43–77)

## 2012-07-24 LAB — PROTIME-INR
INR: 1.47 (ref 0.00–1.49)
Prothrombin Time: 17.4 seconds — ABNORMAL HIGH (ref 11.6–15.2)

## 2012-07-24 LAB — POCT I-STAT TROPONIN I: Troponin i, poc: 0.01 ng/mL (ref 0.00–0.08)

## 2012-07-24 MED ORDER — PROCHLORPERAZINE MALEATE 10 MG PO TABS
10.0000 mg | ORAL_TABLET | Freq: Four times a day (QID) | ORAL | Status: DC | PRN
  Filled 2012-07-24: qty 1

## 2012-07-24 MED ORDER — RIVAROXABAN 20 MG PO TABS
20.0000 mg | ORAL_TABLET | Freq: Every evening | ORAL | Status: DC
  Administered 2012-07-24 – 2012-07-25 (×2): 20 mg via ORAL
  Filled 2012-07-24 (×3): qty 1

## 2012-07-24 MED ORDER — ACETAMINOPHEN 325 MG PO TABS: 650.0000 mg | ORAL_TABLET | ORAL | Status: DC | PRN

## 2012-07-24 MED ORDER — SUCRALFATE 1 G PO TABS
1.0000 g | ORAL_TABLET | Freq: Three times a day (TID) | ORAL | Status: DC
  Administered 2012-07-24 – 2012-07-26 (×5): 1 g via ORAL
  Filled 2012-07-24 (×8): qty 1

## 2012-07-24 MED ORDER — FERROUS SULFATE 325 (65 FE) MG PO TABS
325.0000 mg | ORAL_TABLET | Freq: Two times a day (BID) | ORAL | Status: DC
  Administered 2012-07-24 – 2012-07-26 (×4): 325 mg via ORAL
  Filled 2012-07-24 (×6): qty 1

## 2012-07-24 MED ORDER — PANTOPRAZOLE SODIUM 40 MG PO TBEC
40.0000 mg | DELAYED_RELEASE_TABLET | Freq: Every day | ORAL | Status: DC
  Administered 2012-07-24 – 2012-07-26 (×3): 40 mg via ORAL

## 2012-07-24 MED ORDER — PAREGORIC 2 MG/5ML PO TINC: 5.0000 mL | Freq: Four times a day (QID) | ORAL | Status: DC | PRN

## 2012-07-24 MED ORDER — ONDANSETRON HCL 4 MG PO TABS: 4.0000 mg | ORAL_TABLET | Freq: Three times a day (TID) | ORAL | Status: DC | PRN

## 2012-07-24 MED ORDER — PSYLLIUM 95 % PO PACK
1.0000 | PACK | Freq: Two times a day (BID) | ORAL | Status: DC
  Administered 2012-07-25 – 2012-07-26 (×3): 1 via ORAL
  Filled 2012-07-24 (×5): qty 1

## 2012-07-24 MED ORDER — SODIUM CHLORIDE 0.9 % IV SOLN
INTRAVENOUS | Status: DC
  Administered 2012-07-24: 18:00:00 via INTRAVENOUS

## 2012-07-24 MED ORDER — ALUM & MAG HYDROXIDE-SIMETH 200-200-20 MG/5ML PO SUSP
30.0000 mL | Freq: Once | ORAL | Status: AC
  Administered 2012-07-24: 30 mL via ORAL
  Filled 2012-07-24: qty 30

## 2012-07-24 MED ORDER — LORAZEPAM 2 MG/ML IJ SOLN
1.0000 mg | Freq: Once | INTRAMUSCULAR | Status: AC
  Administered 2012-07-24: 1 mg via INTRAVENOUS
  Filled 2012-07-24: qty 1

## 2012-07-24 MED ORDER — GI COCKTAIL ~~LOC~~
30.0000 mL | Freq: Once | ORAL | Status: AC
  Administered 2012-07-24: 30 mL via ORAL
  Filled 2012-07-24: qty 30

## 2012-07-24 MED ORDER — CAPECITABINE 500 MG PO TABS: 1500.0000 mg | ORAL_TABLET | Freq: Two times a day (BID) | ORAL | Status: DC

## 2012-07-24 NOTE — ED Notes (Signed)
Pt in MRI at the time. 

## 2012-07-24 NOTE — Consult Note (Signed)
Referring Physician: Preston Fleeting    Chief Complaint: Difficulty with speech  HPI: [name] [name] is an 79 y.o. male who has been in the hospital often recently who was doing well at home today.  Was lying on the couch to put his feet up and when he attempted to get up was noted to have difficulty getting his words out.  No focal weakness was noted.  Patient has had TIA's and strokes int he past.  EMS was called at that time and the patient was brought in for evaluation.  By the time of evaluation speech had improved significantly.   Patient has had a recent work up in January of this year.  A1c was 5.7.  LDL was 28.  Echocardiogram was performed in March and showed an EF of 50-55% and no evidence of intracardiac masses or thrombi.    Date last known well: 07/24/2012 Time last known well: 1115 tPA Given: No: Patient on Xarelto, improvement in symptoms, recent surgery  Past Medical History  Diagnosis Date  . Ramsay Hunt auricular syndrome   . HTN (hypertension)   . Osteoarthritis   . Dysrhythmia 04/17/12  . Anxiety   . Stroke   . H/O Bell's palsy at age 11  . Atrial flutter   . Atrial fibrillation   . Ileostomy in place 06/14/2012    Past Surgical History  Procedure Laterality Date  . Hernia repair    . Vasectomy    . Prostate biopsies      per Vonita Moss  . Cataract extraction      SE eye  . Colonoscopy N/A 06/03/2012    Procedure: COLONOSCOPY;  Surgeon: Florencia Reasons, MD;  Location: [facility] ENDOSCOPY;  Service: Endoscopy;  Laterality: N/A;  . Esophagogastroduodenoscopy N/A 06/03/2012    Procedure: ESOPHAGOGASTRODUODENOSCOPY (EGD);  Surgeon: Florencia Reasons, MD;  Location: [facility] ENDOSCOPY;  Service: Endoscopy;  Laterality: N/A;  . Laparoscopy N/A 06/13/2012    Procedure: EXPLORATORY LAPAROSCOPY ;  Surgeon: Liz Malady, MD;  Location: [facility] OR;  Service: General;  Laterality: N/A;  . Diverting ileostomy N/A 06/13/2012    Procedure: DIVERTING LOOP ILEOSTOMY;  Surgeon: Liz Malady, MD;   Location: [facility] OR;  Service: General;  Laterality: N/A;  . Lymph node biopsy Left 06/13/2012    Procedure: LYMPH NODE BIOPSY;  Surgeon: Liz Malady, MD;  Location: [facility] OR;  Service: General;  Laterality: Left;  [place] Lymph Node Biopsy    Family History  Problem Relation Age of Onset  . Cancer Father   . Diabetes Father    Social History:  reports that he quit smoking about 28 years ago. He has never used smokeless tobacco. He reports that  drinks alcohol. He reports that he does not use illicit drugs.  Allergies:  Allergies  Allergen Reactions  . Penicillins Rash    Rash appeared on palms of hands and feet.    Medications: I have reviewed the patient's current medications. Prior to Admission:  No current facility-administered medications for this encounter. Current outpatient prescriptions: capecitabine (XELODA) 500 MG tablet, Take 1,500 mg by mouth 2 (two) times daily after a meal. Take for two weeks, then off a week , then restart., Disp: , Rfl: ;   diltiazem (CARDIZEM CD) 120 MG 24 hr capsule, Take 1 capsule (120 mg total) by mouth every morning. For atrial fibrillation, Disp: 30 capsule, Rfl: 1 ferrous sulfate 325 (65 FE) MG tablet, Take 1 tablet (325 mg total) by mouth 2 (two) times daily  with a meal., Disp: 60 tablet, Rfl: 1;   megestrol (MEGACE ORAL) 40 MG/ML suspension, Take 5 mLs (200 mg total) by mouth 2 (two) times daily., Disp: 300 mL, Rfl: 2;   metoprolol succinate (TOPROL XL) 25 MG 24 hr tablet, Take 1 tablet (25 mg total) by mouth daily., Disp: 30 tablet, Rfl: 2 Multiple Vitamins-Minerals (PRESERVISION AREDS PO), Take 1 tablet by mouth 2 (two) times daily., Disp: , Rfl: ;  omeprazole (PRILOSEC) 20 MG capsule, Take 20 mg by mouth 2 (two) times daily. , Disp: , Rfl: ;   ondansetron (ZOFRAN) 4 MG tablet, Take 4 mg by mouth every 8 (eight) hours as needed for nausea., Disp: , Rfl:  paregoric 2 MG/5ML solution, Take 5 mLs by mouth 4 (four) times daily as needed for diarrhea  or loose stools (may also continue to take Imodium in addition as needed)., Disp: 120 mL, Rfl: 0;   prochlorperazine (COMPAZINE) 10 MG tablet, Take 10 mg by mouth every 6 (six) hours as needed (nausea)., Disp: , Rfl: ;   psyllium (HYDROCIL/METAMUCIL) 95 % PACK, Take 1 packet by mouth 2 (two) times daily., Disp: , Rfl:  Rivaroxaban (XARELTO) 20 MG TABS, Take 20 mg by mouth every evening., Disp: , Rfl: ;   sucralfate (CARAFATE) 1 G tablet, Take 1 g by mouth 3 (three) times daily before meals. , Disp: , Rfl:   ROS: History obtained from the patient  General ROS: negative for - chills, fatigue, fever, night sweats, weight gain or weight loss Psychological ROS: negative for - behavioral disorder, hallucinations, memory difficulties, mood swings or suicidal ideation Ophthalmic ROS: negative for - blurry vision, double vision, eye pain or loss of vision ENT ROS: negative for - epistaxis, nasal discharge, oral lesions, sore throat, tinnitus or vertigo Allergy and Immunology ROS: negative for - hives or itchy/watery eyes Hematological and Lymphatic ROS: negative for - bleeding problems, bruising or swollen lymph nodes Endocrine ROS: negative for - galactorrhea, hair pattern changes, polydipsia/polyuria or temperature intolerance Respiratory ROS: negative for - cough, hemoptysis, shortness of breath or wheezing Cardiovascular ROS: negative for - chest pain, dyspnea on exertion, edema or irregular heartbeat Gastrointestinal ROS: negative for - abdominal pain, diarrhea, hematemesis, nausea/vomiting or stool incontinence Genito-Urinary ROS: negative for - dysuria, hematuria, incontinence or urinary frequency/urgency Musculoskeletal ROS: negative for - joint swelling or muscular weakness Neurological ROS: as noted in HPI Dermatological ROS: negative for rash and skin lesion changes  Physical Examination: Blood pressure 126/76, pulse 72, temperature 97.5 F (36.4 C), temperature source Oral, resp. rate  18, SpO2 98.00%.  Neurologic Examination: Mental Status: Alert, oriented, thought content appropriate.  Speech fluent without evidence of aphasia.  Able to follow 3 step commands without difficulty. Cranial Nerves: II: Discs flat bilaterally; Visual fields grossly normal, pupils equal, round, reactive to light and accommodation III,IV, VI: ptosis not present, extra-ocular motions intact bilaterally V,VII: left facial droop, facial light touch sensation normal bilaterally VIII: hearing normal bilaterally IX,X: gag reflex present XI: bilateral shoulder shrug XII: midline tongue extension Motor: Right : Upper extremity   5/5    Left:     Upper extremity   5/5  Lower extremity   5/5     Lower extremity   5/5 Tone and bulk:normal tone throughout; no atrophy noted Sensory: Pinprick and light touch intact throughout, bilaterally Deep Tendon Reflexes: 2+ throughout with absent AJ's bilaterally Plantars: Right: downgoing   Left: downgoing Cerebellar: normal finger-to-nose and normal heel-to-shin test Gait: Unable to test CV:  pulses palpable throughout   Laboratory Studies:  Basic Metabolic Panel:  Recent Labs Lab 07/19/12 0435 07/24/12 1205 07/24/12 1218  NA 132* 134* 137  K 4.2 4.3 4.4  CL 102 105 107  CO2 24 22  --   GLUCOSE 108* 116* 115*  BUN 20 13 13   CREATININE 1.02 0.95 0.90  CALCIUM 9.7 9.5  --     Liver Function Tests:  Recent Labs Lab 07/19/12 0435 07/24/12 1205  AST 116* 22  ALT 121* 41  ALKPHOS 203* 167*  BILITOT 0.5 0.5  PROT 5.8* 5.7*  ALBUMIN 2.9* 2.6*    Recent Labs Lab 07/19/12 0435  LIPASE 27   No results found for this basename: AMMONIA,  in the last 168 hours  CBC:  Recent Labs Lab 07/19/12 0435 07/24/12 1205 07/24/12 1218  WBC 8.8 8.9  --   NEUTROABS 6.9 6.1  --   HGB 10.1* 10.2* 10.2*  HCT 30.2* 28.3* 30.0*  MCV 93.5 90.7  --   PLT 295 261  --     Cardiac Enzymes:  Recent Labs Lab 07/24/12 1205  TROPONINI <0.30     BNP: No components found with this basename: POCBNP,   CBG:  Recent Labs Lab 07/24/12 1227  GLUCAP 89    Microbiology: Results for orders placed during the hospital encounter of 07/19/12  CLOSTRIDIUM DIFFICILE BY PCR     Status: None   Collection Time    07/19/12  9:04 AM      Result Value Range Status   C difficile by pcr NEGATIVE  NEGATIVE Final    Coagulation Studies:  Recent Labs  07/24/12 1205  LABPROT 17.4*  INR 1.47    Urinalysis:  Recent Labs Lab 07/19/12 0512 07/24/12 1259  COLORURINE YELLOW YELLOW  LABSPEC 1.025 1.020  PHURINE 6.0 5.0  GLUCOSEU NEGATIVE NEGATIVE  HGBUR MODERATE* NEGATIVE  BILIRUBINUR NEGATIVE NEGATIVE  KETONESUR NEGATIVE NEGATIVE  PROTEINUR NEGATIVE NEGATIVE  UROBILINOGEN 0.2 0.2  NITRITE NEGATIVE NEGATIVE  LEUKOCYTESUR NEGATIVE NEGATIVE    Lipid Panel:    Component Value Date/Time   CHOL 100 04/18/2012 0610   TRIG 61 04/18/2012 0610   HDL 60 04/18/2012 0610   CHOLHDL 1.7 04/18/2012 0610   VLDL 12 04/18/2012 0610   LDLCALC 28 04/18/2012 0610    HgbA1C:  Lab Results  Component Value Date   HGBA1C 5.7* 04/18/2012    Urine Drug Screen:     Component Value Date/Time   LABOPIA POSITIVE* 07/24/2012 1259   COCAINSCRNUR NONE DETECTED 07/24/2012 1259   LABBENZ NONE DETECTED 07/24/2012 1259   AMPHETMU NONE DETECTED 07/24/2012 1259   THCU POSITIVE* 07/24/2012 1259   LABBARB POSITIVE* 07/24/2012 1259    Alcohol Level:  Recent Labs Lab 07/24/12 1205  ETH <11    Other results: EKG: normal EKG, normal sinus rhythm at 68 bpm.  Imaging: Ct Head Wo Contrast  07/24/2012  *RADIOLOGY REPORT*  Clinical Data: Code stroke, aphasia  CT HEAD WITHOUT CONTRAST  Technique:  Contiguous axial images were obtained from the base of the skull through the vertex without contrast.  Comparison: 06/04/2012; 04/17/2012  Findings:  Redemonstrated mild diffuse atrophy with sulcal prominence. Scattered periventricular hypodensities, most conspicuous about the  anterior horn of the left lateral ventricle (image 17) and within the subcortical right frontal (image 20) and the left parietal (image 21) lobes are grossly unchanged and again compatible with microvascular ischemic disease. Given background parenchymal abnormalities, there is no CT evidence of acute large territory infarct.  No intraparenchymal or extra-axial mass or hemorrhage. Unchanged size and configuration of the ventricles and basilar cisterns.  No midline shift.  There is chronic near-complete opacification of the left maxillary sinus.  Remaining paranasal sinuses and mastoid air cells are normally aerated.  Post bilateral cataract surgery.  The regional soft tissues are normal.  No displaced calvarial fracture.  IMPRESSION: 1.  Stable findings of atrophy and microvascular ischemic disease without acute intracranial process. 2.  Chronic near-complete opacification of the left maxillary sinus.  Above findings discussed with Dr. Thad Ranger at 12:16.   Original Report Authenticated By: Tacey Ruiz, MD     Assessment: 79 y.o. male presenting with an expressive aphasia.  Symptoms have now resolved.  Patient only with a facial droop on examination that was pre-existing.  Patient on Xarelto with his last dose being this morning.  Has had GI surgery in the past 2-3 weeks for colon cancer.  Patient is not a tPA candidate.  Has had a recent stroke work up that should not be repeated at this time.    Stroke Risk Factors - atrial fibrillation and hypertension  Plan: 1. Continue Xarelto 2.  Carotid doppler or MRA to rule out critical carotid stenosis.  If no critical stenosis noted, no further work up recommended and no neurologic necessity for admission.   3.  Continue outpatient follow up.     Thana Farr, MD [facility] [phone] 07/24/2012, 2:56 PM

## 2012-07-24 NOTE — ED Provider Notes (Signed)
History     CSN: [id number]  Arrival date & time 07/24/12  1154   First MD Initiated Contact with Patient 07/24/12 1222      Chief Complaint  Patient presents with  . Code Stroke    (Consider location/radiation/quality/duration/timing/severity/associated sxs/prior treatment) The history is provided by the patient and the spouse.   79 year old male was noted to have slurred speech and not be making sense at 10:45 AM. He was last noted to be talking normally at 10:15 AM. He has a history of prior stroke and his wife called an ambulance to bring him into the hospital. He is now back to his baseline. There is no weakness and he denies any headache. There is no chest pain, heaviness, tightness, pressure. There's no nausea, vomiting, diarrhea. Symptoms were moderate. Nothing made it better nothing made it worse. He does have history of stroke and TIA in the past. Of note, he was recently diagnosed with atrial fibrillation and is on rivaroxaban for anticoagulation.  Past Medical History  Diagnosis Date  . Ramsay Hunt auricular syndrome   . HTN (hypertension)   . Osteoarthritis   . Dysrhythmia 04/17/12  . Anxiety   . Stroke   . H/O Bell's palsy at age 54  . Atrial flutter   . Atrial fibrillation   . Ileostomy in place 06/14/2012    Past Surgical History  Procedure Laterality Date  . Hernia repair    . Vasectomy    . Prostate biopsies      per Vonita Moss  . Cataract extraction      SE eye  . Colonoscopy N/A 06/03/2012    Procedure: COLONOSCOPY;  Surgeon: Florencia Reasons, MD;  Location: [facility] ENDOSCOPY;  Service: Endoscopy;  Laterality: N/A;  . Esophagogastroduodenoscopy N/A 06/03/2012    Procedure: ESOPHAGOGASTRODUODENOSCOPY (EGD);  Surgeon: Florencia Reasons, MD;  Location: [facility] ENDOSCOPY;  Service: Endoscopy;  Laterality: N/A;  . Laparoscopy N/A 06/13/2012    Procedure: EXPLORATORY LAPAROSCOPY ;  Surgeon: Liz Malady, MD;  Location: [facility] OR;  Service: General;  Laterality: N/A;  .  Diverting ileostomy N/A 06/13/2012    Procedure: DIVERTING LOOP ILEOSTOMY;  Surgeon: Liz Malady, MD;  Location: [facility] OR;  Service: General;  Laterality: N/A;  . Lymph node biopsy Left 06/13/2012    Procedure: LYMPH NODE BIOPSY;  Surgeon: Liz Malady, MD;  Location: [facility] OR;  Service: General;  Laterality: Left;  [place] Lymph Node Biopsy    Family History  Problem Relation Age of Onset  . Cancer Father   . Diabetes Father     History  Substance Use Topics  . Smoking status: Former Smoker    Quit date: 06/22/1984  . Smokeless tobacco: Never Used  . Alcohol Use: 0.0 oz/week     Comment: 1 glass of wine a day      Review of Systems  All other systems reviewed and are negative.    Allergies  Penicillins  Home Medications   Current Outpatient Rx  Name  Route  Sig  Dispense  Refill  . capecitabine (XELODA) 500 MG tablet   Oral   Take 1,500 mg by mouth 2 (two) times daily after a meal. X 14 days, 7 days rest Start on 07/11/12         . diltiazem (CARDIZEM CD) 120 MG 24 hr capsule   Oral   Take 1 capsule (120 mg total) by mouth every morning. For atrial fibrillation   30 capsule   1   .  ferrous sulfate 325 (65 FE) MG tablet   Oral   Take 1 tablet (325 mg total) by mouth 2 (two) times daily with a meal.   60 tablet   1   . HYDROcodone-acetaminophen (NORCO) 5-325 MG per tablet   Oral   Take 1 tablet by mouth every 4 (four) hours as needed for pain.   30 tablet   0   . loperamide (IMODIUM) 2 MG capsule   Oral   Take 2 mg by mouth 4 (four) times daily as needed for diarrhea or loose stools.         . megestrol (MEGACE ORAL) 40 MG/ML suspension   Oral   Take 5 mLs (200 mg total) by mouth 2 (two) times daily.   300 mL   2   . metoprolol succinate (TOPROL XL) 25 MG 24 hr tablet   Oral   Take 1 tablet (25 mg total) by mouth daily.   30 tablet   2   . Multiple Vitamins-Minerals (PRESERVISION AREDS PO)   Oral   Take 1 tablet by mouth 2 (two) times  daily.         Marland Kitchen omeprazole (PRILOSEC) 20 MG capsule   Oral   Take 20 mg by mouth 2 (two) times daily.          . ondansetron (ZOFRAN) 4 MG tablet   Oral   Take 4 mg by mouth every 8 (eight) hours as needed for nausea.         . paregoric 2 MG/5ML solution   Oral   Take 5 mLs by mouth 4 (four) times daily as needed for diarrhea or loose stools (may also continue to take Imodium in addition as needed).   120 mL   0     Faxed script to [facility]   . prochlorperazine (COMPAZINE) 10 MG tablet   Oral   Take 10 mg by mouth every 6 (six) hours as needed (nausea).         . psyllium (HYDROCIL/METAMUCIL) 95 % PACK   Oral   Take 1 packet by mouth 2 (two) times daily.         . Rivaroxaban (XARELTO) 20 MG TABS   Oral   Take 20 mg by mouth every evening.         . sucralfate (CARAFATE) 1 G tablet   Oral   Take 1 g by mouth 3 (three) times daily before meals.            BP 126/76  Pulse 72  Temp(Src) 97.5 F (36.4 C) (Oral)  Resp 18  SpO2 98%  Physical Exam  Nursing note and vitals reviewed.  79 year old male, resting comfortably and in no acute distress. Vital signs are normal. Oxygen saturation is 98%, which is normal. Head is normocephalic and atraumatic. PERRLA, EOMI. Oropharynx is clear. Left central facial droop is present. Neck is nontender and supple without adenopathy or JVD. There no carotid bruits. Back is nontender and there is no CVA tenderness. Lungs are clear without rales, wheezes, or rhonchi. Chest is nontender. Heart has regular rate and rhythm without murmur. Abdomen is soft, flat, nontender without masses or hepatosplenomegaly and peristalsis is normoactive. Colostomy is present right lower quadrant. Extremities have trace edema, full range of motion is present. Skin is warm and dry without rash. Neurologic: Mental status is normal, speech is normal, cranial nerves are significant for left facial droop which is old, there are no  motor or sensory deficits. There is no pronator drift.  ED Course  Procedures (including critical care time)  Results for orders placed during the hospital encounter of 07/24/12  ETHANOL      Result Value Range   Alcohol, Ethyl (B) <11  0 - 11 mg/dL  PROTIME-INR      Result Value Range   Prothrombin Time 17.4 (*) 11.6 - 15.2 seconds   INR 1.47  0.00 - 1.49  APTT      Result Value Range   aPTT 37  24 - 37 seconds  CBC      Result Value Range   WBC 8.9  4.0 - 10.5 K/uL   RBC 3.12 (*) 4.22 - 5.81 MIL/uL   Hemoglobin 10.2 (*) 13.0 - 17.0 g/dL   HCT 16.1 (*) 09.6 - 04.5 %   MCV 90.7  78.0 - 100.0 fL   MCH 32.7  26.0 - 34.0 pg   MCHC 36.0  30.0 - 36.0 g/dL   RDW 40.9 (*) 81.1 - 91.4 %   Platelets 261  150 - 400 K/uL  DIFFERENTIAL      Result Value Range   Neutrophils Relative 69  43 - 77 %   Neutro Abs 6.1  1.7 - 7.7 K/uL   Lymphocytes Relative 15  12 - 46 %   Lymphs Abs 1.3  0.7 - 4.0 K/uL   Monocytes Relative 15 (*) 3 - 12 %   Monocytes Absolute 1.4 (*) 0.1 - 1.0 K/uL   Eosinophils Relative 1  0 - 5 %   Eosinophils Absolute 0.1  0.0 - 0.7 K/uL   Basophils Relative 0  0 - 1 %   Basophils Absolute 0.0  0.0 - 0.1 K/uL  COMPREHENSIVE METABOLIC PANEL      Result Value Range   Sodium 134 (*) 135 - 145 mEq/L   Potassium 4.3  3.5 - 5.1 mEq/L   Chloride 105  96 - 112 mEq/L   CO2 22  19 - 32 mEq/L   Glucose, Bld 116 (*) 70 - 99 mg/dL   BUN 13  6 - 23 mg/dL   Creatinine, Ser 7.82  0.50 - 1.35 mg/dL   Calcium 9.5  8.4 - 95.6 mg/dL   Total Protein 5.7 (*) 6.0 - 8.3 g/dL   Albumin 2.6 (*) 3.5 - 5.2 g/dL   AST 22  0 - 37 U/L   ALT 41  0 - 53 U/L   Alkaline Phosphatase 167 (*) 39 - 117 U/L   Total Bilirubin 0.5  0.3 - 1.2 mg/dL   GFR calc non Af Amer 77 (*) >90 mL/min   GFR calc Af Amer 89 (*) >90 mL/min  TROPONIN I      Result Value Range   Troponin I <0.30  <0.30 ng/mL  URINE RAPID DRUG SCREEN (HOSP PERFORMED)      Result Value Range   Opiates POSITIVE (*) NONE DETECTED    Cocaine NONE DETECTED  NONE DETECTED   Benzodiazepines NONE DETECTED  NONE DETECTED   Amphetamines NONE DETECTED  NONE DETECTED   Tetrahydrocannabinol POSITIVE (*) NONE DETECTED   Barbiturates POSITIVE (*) NONE DETECTED  URINALYSIS, ROUTINE W REFLEX MICROSCOPIC      Result Value Range   Color, Urine YELLOW  YELLOW   APPearance CLEAR  CLEAR   Specific Gravity, Urine 1.020  1.005 - 1.030   pH 5.0  5.0 - 8.0   Glucose, UA NEGATIVE  NEGATIVE mg/dL   Hgb urine dipstick  NEGATIVE  NEGATIVE   Bilirubin Urine NEGATIVE  NEGATIVE   Ketones, ur NEGATIVE  NEGATIVE mg/dL   Protein, ur NEGATIVE  NEGATIVE mg/dL   Urobilinogen, UA 0.2  0.0 - 1.0 mg/dL   Nitrite NEGATIVE  NEGATIVE   Leukocytes, UA NEGATIVE  NEGATIVE  GLUCOSE, CAPILLARY      Result Value Range   Glucose-Capillary 89  70 - 99 mg/dL  POCT I-STAT TROPONIN I      Result Value Range   Troponin i, poc 0.01  0.00 - 0.08 ng/mL   Comment 3           POCT I-STAT, CHEM 8      Result Value Range   Sodium 137  135 - 145 mEq/L   Potassium 4.4  3.5 - 5.1 mEq/L   Chloride 107  96 - 112 mEq/L   BUN 13  6 - 23 mg/dL   Creatinine, Ser 4.54  0.50 - 1.35 mg/dL   Glucose, Bld 098 (*) 70 - 99 mg/dL   Calcium, Ion 1.19 (*) 1.13 - 1.30 mmol/L   TCO2 22  0 - 100 mmol/L   Hemoglobin 10.2 (*) 13.0 - 17.0 g/dL   HCT 14.7 (*) 82.9 - 56.2 %   Ct Abdomen Pelvis Wo Contrast  07/16/2012  *RADIOLOGY REPORT*  Clinical Data: Abdominal pain.  Weakness and indigestion for 2 days.  CT ABDOMEN AND PELVIS WITHOUT CONTRAST  Technique:  Multidetector CT imaging of the abdomen and pelvis was performed following the standard protocol without intravenous contrast.  Comparison: 05/30/2012  Findings: Infiltration and interstitial fibrosis in the lung bases, greater than on previous study. Coronary artery calcification.  Diffuse free fluid in the upper abdomen.  Hounsfield unit measurements are consistent with ascites.  The amount of ascites appears relatively stable since  previous study. The unenhanced appearance of the liver, spleen, gallbladder, pancreas, adrenal glands, and inferior vena cava is unremarkable. There is probably residual intrahepatic bile duct dilatation although the bile ducts are not well demonstrated on unenhanced scanning.  No extrahepatic ductal dilatation is visualized.  Hyperdense lesions demonstrated in both kidneys consistent with hemorrhagic cysts.  No hydronephrosis in either kidney.  Calcified and tortuous aorta without aneurysm.  Ectatic common iliac arteries, right measuring 1.5 cm diameter and left 1.5 cm diameter.  There is interval placement of right lower quadrant ostomy which appears to represent a diverting colostomy.  There is residual contrast material in the right colon and in the rectosigmoid colon.  The rest of the colon is mostly decompressed and cannot be evaluated for wall thickness. Residual or wall thickening is not excluded.  Small bowel loops are not distended but are diffusely fluid-filled.  Stomach is incompletely distended.  Gastric wall thickening is not excluded. No free air in the abdomen.  Diffuse mesenteric edema.  Pelvis:  Prostate gland is enlarged.  Bladder wall appears thickened which could be due to under distension, hypertrophy, or cystitis.  Mild fluid in the pelvis extending down from the abdomen.  No evidence of abscess.  Degenerative changes in the lumbar spine.  Mild anterior subluxation of L4 on L5.  IMPRESSION: There appears to the interval placement of a diverting colostomy. Stomach, small bowel, and colon are decompressed which precludes evaluation for wall thickening. Residual inflammatory process in the stomach, small bowel, colon cannot be excluded on this examination.  No evidence of distension to suggest obstruction. Diffuse abdominal ascites appears relatively stable since previous study.  Infiltration in the lung bases.  Original Report Authenticated By: Burman Nieves, M.D.    Ct Head Wo  Contrast  07/24/2012  *RADIOLOGY REPORT*  Clinical Data: Code stroke, aphasia  CT HEAD WITHOUT CONTRAST  Technique:  Contiguous axial images were obtained from the base of the skull through the vertex without contrast.  Comparison: 06/04/2012; 04/17/2012  Findings:  Redemonstrated mild diffuse atrophy with sulcal prominence. Scattered periventricular hypodensities, most conspicuous about the anterior horn of the left lateral ventricle (image 17) and within the subcortical right frontal (image 20) and the left parietal (image 21) lobes are grossly unchanged and again compatible with microvascular ischemic disease. Given background parenchymal abnormalities, there is no CT evidence of acute large territory infarct.  No intraparenchymal or extra-axial mass or hemorrhage. Unchanged size and configuration of the ventricles and basilar cisterns.  No midline shift.  There is chronic near-complete opacification of the left maxillary sinus.  Remaining paranasal sinuses and mastoid air cells are normally aerated.  Post bilateral cataract surgery.  The regional soft tissues are normal.  No displaced calvarial fracture.  IMPRESSION: 1.  Stable findings of atrophy and microvascular ischemic disease without acute intracranial process. 2.  Chronic near-complete opacification of the left maxillary sinus.  Above findings discussed with Dr. Thad Ranger at 12:16.   Original Report Authenticated By: Tacey Ruiz, MD    Mr Angiogram Head Wo Contrast  07/24/2012  *RADIOLOGY REPORT*  Clinical Data:  Code stroke.  A aphasia.  MRI HEAD WITHOUT CONTRAST MRA HEAD WITHOUT CONTRAST  Technique:  Multiplanar, multiecho pulse sequences of the brain and surrounding structures were obtained without intravenous contrast. Angiographic images of the head were obtained using MRA technique without contrast.  Comparison:  CT head without contrast 07/24/2012.  MRI brain without contrast 03/12/2011  MRI HEAD  Findings:  The diffusion weighted images  demonstrate no evidence for acute or subacute infarction.  Atrophy and diffuse white matter disease is similar to the prior exam.  A remote left frontal lobe infarct is stable.  Remote lacunar infarcts are noted in the basal ganglia bilaterally as well as the cerebellum, more prominently on the right.  The ventricles are proportionate to the degree of atrophy.  No significant extra-axial fluid collection is present.  The patient is status post bilateral lens extractions.  Chronic left maxillary sinus opacification is evident.  The remaining paranasal sinuses are clear.  There is fluid in the mastoid air cells bilaterally, right greater than left.  No obstructing nasopharyngeal lesion is evident.  The mastoid effusions are chronic as well.  IMPRESSION:  1.  No acute intracranial abnormality or significant interval change. 2.  Stable atrophy and diffuse white matter disease. 3.  Remote left frontal lobe infarct. 4.  Remote basal ganglia and cerebellar lacunar infarcts. 5.  Chronic left maxillary sinus disease. 6.  Chronic mastoid effusions bilaterally.  No obstructing nasopharyngeal lesion is evident.  MRA HEAD  Findings: The internal carotid arteries demonstrate tortuosity of the high cervical segments bilaterally without significant stenosis.  The remainder of the internal carotid arteries are normal.  The A1 and M1 segments are normal.  The anterior communicating artery is patent.  Segmental narrowing of the distal MCA branch vessels is similar to the prior exam.  The MCA bifurcations are within normal limits bilaterally.  The left vertebral artery is slightly dominant to the right vertebral artery.  The PICA origins are visualized and normal bilaterally.  The basilar artery is normal.  Both posterior cerebral arteries originate from basilar tip.  There is  some attenuation of distal PCA branch vessels bilaterally.  IMPRESSION:  1.  No significant proximal stenosis, aneurysm, or branch vessel occlusion. 2.  Mild  distal small vessel disease.   Original Report Authenticated By: Marin Roberts, M.D.    Mr Brain Wo Contrast  07/24/2012  *RADIOLOGY REPORT*  Clinical Data:  Code stroke.  A aphasia.  MRI HEAD WITHOUT CONTRAST MRA HEAD WITHOUT CONTRAST  Technique:  Multiplanar, multiecho pulse sequences of the brain and surrounding structures were obtained without intravenous contrast. Angiographic images of the head were obtained using MRA technique without contrast.  Comparison:  CT head without contrast 07/24/2012.  MRI brain without contrast 03/12/2011  MRI HEAD  Findings:  The diffusion weighted images demonstrate no evidence for acute or subacute infarction.  Atrophy and diffuse white matter disease is similar to the prior exam.  A remote left frontal lobe infarct is stable.  Remote lacunar infarcts are noted in the basal ganglia bilaterally as well as the cerebellum, more prominently on the right.  The ventricles are proportionate to the degree of atrophy.  No significant extra-axial fluid collection is present.  The patient is status post bilateral lens extractions.  Chronic left maxillary sinus opacification is evident.  The remaining paranasal sinuses are clear.  There is fluid in the mastoid air cells bilaterally, right greater than left.  No obstructing nasopharyngeal lesion is evident.  The mastoid effusions are chronic as well.  IMPRESSION:  1.  No acute intracranial abnormality or significant interval change. 2.  Stable atrophy and diffuse white matter disease. 3.  Remote left frontal lobe infarct. 4.  Remote basal ganglia and cerebellar lacunar infarcts. 5.  Chronic left maxillary sinus disease. 6.  Chronic mastoid effusions bilaterally.  No obstructing nasopharyngeal lesion is evident.  MRA HEAD  Findings: The internal carotid arteries demonstrate tortuosity of the high cervical segments bilaterally without significant stenosis.  The remainder of the internal carotid arteries are normal.  The A1 and M1  segments are normal.  The anterior communicating artery is patent.  Segmental narrowing of the distal MCA branch vessels is similar to the prior exam.  The MCA bifurcations are within normal limits bilaterally.  The left vertebral artery is slightly dominant to the right vertebral artery.  The PICA origins are visualized and normal bilaterally.  The basilar artery is normal.  Both posterior cerebral arteries originate from basilar tip.  There is some attenuation of distal PCA branch vessels bilaterally.  IMPRESSION:  1.  No significant proximal stenosis, aneurysm, or branch vessel occlusion. 2.  Mild distal small vessel disease.   Original Report Authenticated By: Marin Roberts, M.D.    Ir Fluoro Guide Cv Line Right  07/19/2012  *RADIOLOGY REPORT*  Indication: Poor venous access  ULTRASOUND AND FLUORSCOPIC GUIDED PICC LINE INSERTION  Intravenous Medications: None  Contrast: None  Fluoroscopy Time:  12 seconds  Complications: None immediate  Technique / Findings:  The procedure, risks, benefits, and alternatives were explained to the patient and informed written consent was obtained.  A timeout was performed prior to the initiation of the procedure.  The right upper extremity was prepped with chlorhexidine in a sterile fashion, and a sterile drape was applied covering the operative field.  Maximum barrier sterile technique with sterile gowns and gloves were used for the procedure.  A timeout was performed prior to the initiation of the procedure.  Local anesthesia was provided with 1% lidocaine.  Under direct ultrasound guidance, the rightbasilicvein was accessed with a micropuncture kit  after the overlying soft tissues were anesthetized with 1% lidocaine.  An ultrasound image was saved for documentation purposes.  A guidewire was advanced to the level of the superior caval-atrial junction for measurement purposes and the PICC line was cut to length.  A peel-away sheath was placed and a 41 cm, 5 [place], dual  lumen was inserted to level of the superior caval-atrial junction.  A post procedure spot fluoroscopic was obtained.  The catheter easily aspirated and flushed and was sutured in place.  A dressing was placed.  The patient tolerated the procedure well without immediate post procedural complication.  Impression:  Successful ultrasound and fluoroscopic guided placement of a right basilic vein approach, 41 cm, 5 French,dual lumen PICC with tip at the superior caval-atrial junction.  The PICC line is ready for immediate use.   Original Report Authenticated By: Tacey Ruiz, MD    Ir US Guide Vasc Access Right  07/19/2012  *RADIOLOGY REPORT*  Indication: Poor venous access  ULTRASOUND AND FLUORSCOPIC GUIDED PICC LINE INSERTION  Intravenous Medications: None  Contrast: None  Fluoroscopy Time:  12 seconds  Complications: None immediate  Technique / Findings:  The procedure, risks, benefits, and alternatives were explained to the patient and informed written consent was obtained.  A timeout was performed prior to the initiation of the procedure.  The right upper extremity was prepped with chlorhexidine in a sterile fashion, and a sterile drape was applied covering the operative field.  Maximum barrier sterile technique with sterile gowns and gloves were used for the procedure.  A timeout was performed prior to the initiation of the procedure.  Local anesthesia was provided with 1% lidocaine.  Under direct ultrasound guidance, the rightbasilicvein was accessed with a micropuncture kit after the overlying soft tissues were anesthetized with 1% lidocaine.  An ultrasound image was saved for documentation purposes.  A guidewire was advanced to the level of the superior caval-atrial junction for measurement purposes and the PICC line was cut to length.  A peel-away sheath was placed and a 41 cm, 5 [place], dual lumen was inserted to level of the superior caval-atrial junction.  A post procedure spot fluoroscopic was obtained.   The catheter easily aspirated and flushed and was sutured in place.  A dressing was placed.  The patient tolerated the procedure well without immediate post procedural complication.  Impression:  Successful ultrasound and fluoroscopic guided placement of a right basilic vein approach, 41 cm, 5 French,dual lumen PICC with tip at the superior caval-atrial junction.  The PICC line is ready for immediate use.   Original Report Authenticated By: Tacey Ruiz, MD      Date: 07/24/2012  Rate: 68  Rhythm: normal sinus rhythm and premature atrial contractions (PAC)  QRS Axis: normal  Intervals: normal  ST/T Wave abnormalities: nonspecific T wave changes  Conduction Disutrbances:none  Narrative Interpretation: Occasional PVC, nonspecific T wave flattening. Compared with ECG of 07/22/2012, sinus rhythm has replaced atrial fibrillation.  Old EKG Reviewed: unchanged    1. TIA (transient ischemic attack)    CRITICAL CARE Performed by: [email or web address],UEAVW Total critical care time: 40 minutes Critical care time was exclusive of separately billable procedures and treating other patients. Critical care was necessary to treat or prevent imminent or life-threatening deterioration. Critical care was time spent personally by me on the following activities: development of treatment plan with patient and/or surrogate as well as nursing, discussions with consultants, evaluation of patient's response to treatment, examination of patient, obtaining history from  patient or surrogate, ordering and performing treatments and interventions, ordering and review of laboratory studies, ordering and review of radiographic studies, pulse oximetry and re-evaluation of patient's condition.    MDM  An apparent transient ischemic attack. Head patient was seen with Dr. Thad Ranger of neurology service who agrees that he is not a candidate for any intervention and she recommended TIA evaluation. Patient has been evaluated for TIA in the past  and review of old records shows that he had an echocardiogram done 4 months ago. This does not need to be repeated. I could not find a recent MRA or carotid duplex scan, so those are ordered as well as MRI of the brain. He does have a recent diagnosis of atrial fibrillation but he is in sinus rhythm today and he is therapeutically anticoagulated.  MRI has come back negative for acute process an MRA has come back negative for acute process. Carotid duplex Doppler is only test which is pending. Case is discussed with Dr. Lavera Guise of [facility] who agrees to admit the patient under observation status.      Dione Booze, MD 07/24/12 1536

## 2012-07-24 NOTE — ED Notes (Signed)
Pt arrived via EMS @ 11:54, LSN: @ 11:15, Stroke Team @ 11:47, pt to CT @ 11:57.

## 2012-07-24 NOTE — ED Notes (Addendum)
Pt arrived via [facility] for evaluation of Code Stroke. Sudden onset of confusion and aphasia this morning @ 11:15, witnessed by wife. Upon arrival pt is alert and answering questions appropriately. Able to move all extremities. L sided facial droop from previous diagnosed syndrome. Denies pain at the time. CBG 90 per EMS. PICC line to R upper arm. Currently being treated for stomach and colon cancer, to start chemotherapy soon.

## 2012-07-24 NOTE — Telephone Encounter (Signed)
Wife left VM asking if patient need 1 liter or 2 liters of IVF today? Talked with wife at 87 today: Ostomy output has become much thicker with use of Paregoric 1-2/day. Had liquid output this morning. Has had #2 bags of fluid IV via PICC daily since Saturday and MD said could possibly decrease to 1 bag if he is doing better. Each bag runs over 3 1/2 hours. No nausea and is eating back at his prechemo baseline. Not drinking a lot of fluids (never has). Took 18 minute walk in park today with his walker and plans to go to [employer] with her today. Resumed his Xeloda 07/22/12 pm without any adverse effect. Per Dr. Truett Perna, OK to proceed with one bag of fluids/day. Call for any changes.

## 2012-07-24 NOTE — Telephone Encounter (Signed)
Wife called reporting her husband's speech is unintelligible, I think he's having a stroke, I've called EMS.  Which hospital should he go to.  Advised that the stroke units are on the [facility] but to let EMS determine based on assessment.  Call ended as EMS has arrived.  Dr. Truett Perna notified of call.

## 2012-07-24 NOTE — ED Notes (Signed)
Ordered Reg. Diet tray 

## 2012-07-24 NOTE — H&P (Signed)
[facility] History and Physical  [name] [name] ZOX:[id number] DOB: [birth year] DOA: 07/24/2012  Referring physician: ED PCP: Daisy Floro, MD  Specialists: none   Chief Complaint:  Chief Complaint  Patient presents with  . Code Stroke     HPI: [name] [name] is a 79 y.o. male with gastric cancer who presented to the Ed with difficulty speaking. MRI was negative for acute stroke - symptoms improved fast. He ha sbeen recently diagnosed with afib with RVR and is taking xarelto. He also has a ileostomy and requires iv fluids at night - not to get dehydrated.    Review of Systems: The patient has chronic ileostomy  The patient has chronic facial paralysis   The patient denies fever, vision loss, decreased hearing, hoarseness, chest pain, syncope, dyspnea on exertion, peripheral edema, balance deficits, hemoptysis, abdominal pain, melena, hematochezia, severe indigestion/heartburn, hematuria, incontinence, genital sores, muscle weakness, suspicious skin lesions, transient blindness, difficulty walking, depression, unusual weight change, abnormal bleeding, enlarged lymph nodes, angioedema, and breast masses.    Past Medical History  Diagnosis Date  . Ramsay Hunt auricular syndrome   . HTN (hypertension)   . Osteoarthritis   . Dysrhythmia 04/17/12  . Anxiety   . Stroke   . H/O Bell's palsy at age 87  . Atrial flutter   . Atrial fibrillation   . Ileostomy in place 06/14/2012   Past Surgical History  Procedure Laterality Date  . Hernia repair    . Vasectomy    . Prostate biopsies      per Vonita Moss  . Cataract extraction      SE eye  . Colonoscopy N/A 06/03/2012    Procedure: COLONOSCOPY;  Surgeon: Florencia Reasons, MD;  Location: [facility] ENDOSCOPY;  Service: Endoscopy;  Laterality: N/A;  . Esophagogastroduodenoscopy N/A 06/03/2012    Procedure: ESOPHAGOGASTRODUODENOSCOPY (EGD);  Surgeon: Florencia Reasons, MD;  Location: [facility] ENDOSCOPY;  Service: Endoscopy;  Laterality:  N/A;  . Laparoscopy N/A 06/13/2012    Procedure: EXPLORATORY LAPAROSCOPY ;  Surgeon: Liz Malady, MD;  Location: [facility] OR;  Service: General;  Laterality: N/A;  . Diverting ileostomy N/A 06/13/2012    Procedure: DIVERTING LOOP ILEOSTOMY;  Surgeon: Liz Malady, MD;  Location: [facility] OR;  Service: General;  Laterality: N/A;  . Lymph node biopsy Left 06/13/2012    Procedure: LYMPH NODE BIOPSY;  Surgeon: Liz Malady, MD;  Location: [facility] OR;  Service: General;  Laterality: Left;  [place] Lymph Node Biopsy   Social History:  reports that he quit smoking about 28 years ago. He has never used smokeless tobacco. He reports that  drinks alcohol. He reports that he does not use illicit drugs. Lives with wife   Allergies  Allergen Reactions  . Penicillins Rash    Rash appeared on palms of hands and feet.    Family History  Problem Relation Age of Onset  . Cancer Father   . Diabetes Father      Prior to Admission medications   Medication Sig Start Date End Date Taking? Authorizing Provider  capecitabine (XELODA) 500 MG tablet Take 1,500 mg by mouth 2 (two) times daily after a meal. Take for two weeks, then off a week , then restart. 07/11/12 07/25/12 Yes Ladene Artist, MD  diltiazem (CARDIZEM CD) 120 MG 24 hr capsule Take 1 capsule (120 mg total) by mouth every morning. For atrial fibrillation 06/26/12  Yes Evlyn Kanner Love, PA-C  ferrous sulfate 325 (65 FE) MG tablet Take 1 tablet (325  mg total) by mouth 2 (two) times daily with a meal. 06/26/12  Yes Evlyn Kanner Love, PA-C  megestrol (MEGACE ORAL) 40 MG/ML suspension Take 5 mLs (200 mg total) by mouth 2 (two) times daily. 07/16/12 07/16/13 Yes   metoprolol succinate (TOPROL XL) 25 MG 24 hr tablet Take 1 tablet (25 mg total) by mouth daily. 07/22/12  Yes Ladene Artist, MD  Multiple Vitamins-Minerals (PRESERVISION AREDS PO) Take 1 tablet by mouth 2 (two) times daily.   Yes Historical Provider, MD  omeprazole (PRILOSEC) 20 MG capsule Take 20 mg by mouth 2  (two) times daily.  05/01/10  Yes Historical Provider, MD  ondansetron (ZOFRAN) 4 MG tablet Take 4 mg by mouth every 8 (eight) hours as needed for nausea.   Yes Historical Provider, MD  paregoric 2 MG/5ML solution Take 5 mLs by mouth 4 (four) times daily as needed for diarrhea or loose stools (may also continue to take Imodium in addition as needed). 07/19/12  Yes Ladene Artist, MD  prochlorperazine (COMPAZINE) 10 MG tablet Take 10 mg by mouth every 6 (six) hours as needed (nausea).   Yes Historical Provider, MD  psyllium (HYDROCIL/METAMUCIL) 95 % PACK Take 1 packet by mouth 2 (two) times daily.   Yes Historical Provider, MD  Rivaroxaban (XARELTO) 20 MG TABS Take 20 mg by mouth every evening.   Yes Historical Provider, MD  sucralfate (CARAFATE) 1 G tablet Take 1 g by mouth 3 (three) times daily before meals.    Yes Historical Provider, MD   Physical Exam: Filed Vitals:   07/24/12 1230 07/24/12 1253 07/24/12 1517  BP: 126/76  131/75  Pulse: 72  70  Temp: 97.5 F (36.4 C) 97.5 F (36.4 C) 98.2 F (36.8 C)  TempSrc: Oral  Oral  Resp: 18  20  SpO2: 98%  100%     General:  axox3  Eyes: perla, emoi   ENT: chronic facial paralysis   Neck: no jvd   Cardiovascular: rrr  Respiratory: ctab   Abdomen: soft, nt , ileostomy bag   Skin: pale, dry   Musculoskeletal: intact   Psychiatric: euthymic   Neurologic: intact with exception of the facila nerve exam   Labs on Admission:  Basic Metabolic Panel:  Recent Labs Lab 07/19/12 0435 07/24/12 1205 07/24/12 1218  NA 132* 134* 137  K 4.2 4.3 4.4  CL 102 105 107  CO2 24 22  --   GLUCOSE 108* 116* 115*  BUN 20 13 13   CREATININE 1.02 0.95 0.90  CALCIUM 9.7 9.5  --    Liver Function Tests:  Recent Labs Lab 07/19/12 0435 07/24/12 1205  AST 116* 22  ALT 121* 41  ALKPHOS 203* 167*  BILITOT 0.5 0.5  PROT 5.8* 5.7*  ALBUMIN 2.9* 2.6*    Recent Labs Lab 07/19/12 0435  LIPASE 27   No results found for this basename:  AMMONIA,  in the last 168 hours CBC:  Recent Labs Lab 07/19/12 0435 07/24/12 1205 07/24/12 1218  WBC 8.8 8.9  --   NEUTROABS 6.9 6.1  --   HGB 10.1* 10.2* 10.2*  HCT 30.2* 28.3* 30.0*  MCV 93.5 90.7  --   PLT 295 261  --    Cardiac Enzymes:  Recent Labs Lab 07/24/12 1205  TROPONINI <0.30    BNP (last 3 results)  Recent Labs  06/05/12 1140 06/07/12 0930  PROBNP 4698.0* 1846.0*   CBG:  Recent Labs Lab 07/24/12 1227  GLUCAP 89    Radiological  Exams on Admission: Ct Head Wo Contrast  07/24/2012  *RADIOLOGY REPORT*  Clinical Data: Code stroke, aphasia  CT HEAD WITHOUT CONTRAST  Technique:  Contiguous axial images were obtained from the base of the skull through the vertex without contrast.  Comparison: 06/04/2012; 04/17/2012  Findings:  Redemonstrated mild diffuse atrophy with sulcal prominence. Scattered periventricular hypodensities, most conspicuous about the anterior horn of the left lateral ventricle (image 17) and within the subcortical right frontal (image 20) and the left parietal (image 21) lobes are grossly unchanged and again compatible with microvascular ischemic disease. Given background parenchymal abnormalities, there is no CT evidence of acute large territory infarct.  No intraparenchymal or extra-axial mass or hemorrhage. Unchanged size and configuration of the ventricles and basilar cisterns.  No midline shift.  There is chronic near-complete opacification of the left maxillary sinus.  Remaining paranasal sinuses and mastoid air cells are normally aerated.  Post bilateral cataract surgery.  The regional soft tissues are normal.  No displaced calvarial fracture.  IMPRESSION: 1.  Stable findings of atrophy and microvascular ischemic disease without acute intracranial process. 2.  Chronic near-complete opacification of the left maxillary sinus.  Above findings discussed with Dr. Thad Ranger at 12:16.   Original Report Authenticated By: Tacey Ruiz, MD    Mr Angiogram  Head Wo Contrast  07/24/2012  *RADIOLOGY REPORT*  Clinical Data:  Code stroke.  A aphasia.  MRI HEAD WITHOUT CONTRAST MRA HEAD WITHOUT CONTRAST  Technique:  Multiplanar, multiecho pulse sequences of the brain and surrounding structures were obtained without intravenous contrast. Angiographic images of the head were obtained using MRA technique without contrast.  Comparison:  CT head without contrast 07/24/2012.  MRI brain without contrast 03/12/2011  MRI HEAD  Findings:  The diffusion weighted images demonstrate no evidence for acute or subacute infarction.  Atrophy and diffuse white matter disease is similar to the prior exam.  A remote left frontal lobe infarct is stable.  Remote lacunar infarcts are noted in the basal ganglia bilaterally as well as the cerebellum, more prominently on the right.  The ventricles are proportionate to the degree of atrophy.  No significant extra-axial fluid collection is present.  The patient is status post bilateral lens extractions.  Chronic left maxillary sinus opacification is evident.  The remaining paranasal sinuses are clear.  There is fluid in the mastoid air cells bilaterally, right greater than left.  No obstructing nasopharyngeal lesion is evident.  The mastoid effusions are chronic as well.  IMPRESSION:  1.  No acute intracranial abnormality or significant interval change. 2.  Stable atrophy and diffuse white matter disease. 3.  Remote left frontal lobe infarct. 4.  Remote basal ganglia and cerebellar lacunar infarcts. 5.  Chronic left maxillary sinus disease. 6.  Chronic mastoid effusions bilaterally.  No obstructing nasopharyngeal lesion is evident.  MRA HEAD  Findings: The internal carotid arteries demonstrate tortuosity of the high cervical segments bilaterally without significant stenosis.  The remainder of the internal carotid arteries are normal.  The A1 and M1 segments are normal.  The anterior communicating artery is patent.  Segmental narrowing of the distal MCA  branch vessels is similar to the prior exam.  The MCA bifurcations are within normal limits bilaterally.  The left vertebral artery is slightly dominant to the right vertebral artery.  The PICA origins are visualized and normal bilaterally.  The basilar artery is normal.  Both posterior cerebral arteries originate from basilar tip.  There is some attenuation of distal PCA branch vessels  bilaterally.  IMPRESSION:  1.  No significant proximal stenosis, aneurysm, or branch vessel occlusion. 2.  Mild distal small vessel disease.   Original Report Authenticated By: Marin Roberts, M.D.    Mr Brain Wo Contrast  07/24/2012  *RADIOLOGY REPORT*  Clinical Data:  Code stroke.  A aphasia.  MRI HEAD WITHOUT CONTRAST MRA HEAD WITHOUT CONTRAST  Technique:  Multiplanar, multiecho pulse sequences of the brain and surrounding structures were obtained without intravenous contrast. Angiographic images of the head were obtained using MRA technique without contrast.  Comparison:  CT head without contrast 07/24/2012.  MRI brain without contrast 03/12/2011  MRI HEAD  Findings:  The diffusion weighted images demonstrate no evidence for acute or subacute infarction.  Atrophy and diffuse white matter disease is similar to the prior exam.  A remote left frontal lobe infarct is stable.  Remote lacunar infarcts are noted in the basal ganglia bilaterally as well as the cerebellum, more prominently on the right.  The ventricles are proportionate to the degree of atrophy.  No significant extra-axial fluid collection is present.  The patient is status post bilateral lens extractions.  Chronic left maxillary sinus opacification is evident.  The remaining paranasal sinuses are clear.  There is fluid in the mastoid air cells bilaterally, right greater than left.  No obstructing nasopharyngeal lesion is evident.  The mastoid effusions are chronic as well.  IMPRESSION:  1.  No acute intracranial abnormality or significant interval change. 2.   Stable atrophy and diffuse white matter disease. 3.  Remote left frontal lobe infarct. 4.  Remote basal ganglia and cerebellar lacunar infarcts. 5.  Chronic left maxillary sinus disease. 6.  Chronic mastoid effusions bilaterally.  No obstructing nasopharyngeal lesion is evident.  MRA HEAD  Findings: The internal carotid arteries demonstrate tortuosity of the high cervical segments bilaterally without significant stenosis.  The remainder of the internal carotid arteries are normal.  The A1 and M1 segments are normal.  The anterior communicating artery is patent.  Segmental narrowing of the distal MCA branch vessels is similar to the prior exam.  The MCA bifurcations are within normal limits bilaterally.  The left vertebral artery is slightly dominant to the right vertebral artery.  The PICA origins are visualized and normal bilaterally.  The basilar artery is normal.  Both posterior cerebral arteries originate from basilar tip.  There is some attenuation of distal PCA branch vessels bilaterally.  IMPRESSION:  1.  No significant proximal stenosis, aneurysm, or branch vessel occlusion. 2.  Mild distal small vessel disease.   Original Report Authenticated By: Marin Roberts, M.D.       Assessment/Plan Active Problems:   HYPERTENSION, BENIGN SYSTEMIC   TIA (transient ischemic attack)   Atrial fibrillation   Dehydration Gastric cancer on chemo   1. TIA - wonder if symptoms are from dehydration, hypotension - will check orthostatics. Plan to start iv fluids, hold CCB and BB - check orthostatics in AM. Recent echo OK. Will check carotid US. 2. Gastric cancer - continue xeloda 3. High output ileostomy - will need to resume paregoric and iv fluids 4. Hx of afib - now in NSR     Code Status: full (must indicate code status--if unknown or must be presumed, indicate so) Family Communication:  Wife  (indicate person spoken with, if applicable, with phone number if by telephone) Disposition Plan: home   (indicate anticipated LOS)    Irmalee Riemenschneider [facility] Pager [phone]  If 7PM-7AM, please contact night-coverage [email or web address] Password [facility] 07/24/2012, 4:28  PM       

## 2012-07-24 NOTE — ED Notes (Signed)
Pt returned from MRI °

## 2012-07-25 ENCOUNTER — Observation Stay (HOSPITAL_COMMUNITY): Payer: Medicare Other

## 2012-07-25 ENCOUNTER — Telehealth: Payer: Self-pay | Admitting: *Deleted

## 2012-07-25 DIAGNOSIS — I951 Orthostatic hypotension: Secondary | ICD-10-CM | POA: Diagnosis present

## 2012-07-25 DIAGNOSIS — I4891 Unspecified atrial fibrillation: Secondary | ICD-10-CM

## 2012-07-25 DIAGNOSIS — C169 Malignant neoplasm of stomach, unspecified: Secondary | ICD-10-CM

## 2012-07-25 DIAGNOSIS — G459 Transient cerebral ischemic attack, unspecified: Secondary | ICD-10-CM

## 2012-07-25 DIAGNOSIS — I10 Essential (primary) hypertension: Secondary | ICD-10-CM

## 2012-07-25 DIAGNOSIS — I959 Hypotension, unspecified: Secondary | ICD-10-CM

## 2012-07-25 DIAGNOSIS — E860 Dehydration: Secondary | ICD-10-CM

## 2012-07-25 DIAGNOSIS — I6350 Cerebral infarction due to unspecified occlusion or stenosis of unspecified cerebral artery: Secondary | ICD-10-CM

## 2012-07-25 MED ORDER — DILTIAZEM HCL ER COATED BEADS 120 MG PO CP24
120.0000 mg | ORAL_CAPSULE | Freq: Every day | ORAL | Status: DC
  Administered 2012-07-25 – 2012-07-26 (×2): 120 mg via ORAL
  Filled 2012-07-25 (×2): qty 1

## 2012-07-25 MED ORDER — GI COCKTAIL ~~LOC~~: 30.0000 mL | Freq: Three times a day (TID) | ORAL | Status: DC | PRN

## 2012-07-25 MED ORDER — GI COCKTAIL ~~LOC~~
30.0000 mL | Freq: Once | ORAL | Status: AC
  Administered 2012-07-25: 30 mL via ORAL
  Filled 2012-07-25: qty 30

## 2012-07-25 NOTE — Care Management Note (Signed)
    Page 1 of 1   07/25/2012     12:04:51 PM   CARE MANAGEMENT NOTE 07/25/2012  Patient:  [name] [name], [name] [name]   Account Number:  [id number]  Date Initiated:  07/25/2012  Documentation initiated by:  GRAVES-BIGELOW,Jerlisa Diliberto  Subjective/Objective Assessment:   Pt admitted with TIA symptoms. Possible d/c today. Pt was currently active with [facility] for RN and PT services. Referral made with [facility] for services listed.     Action/Plan:   CM did  speak to MD and she will write resumption orders for services RN/PT. Iv hydration via RN.   Anticipated DC Date:  07/25/2012   Anticipated DC Plan:  HOME W HOME HEALTH SERVICES      DC Planning Services  CM consult      [facility] Choice  HOME HEALTH  Resumption Of Svcs/PTA Provider   Choice offered to / List presented to:  C-1 Patient        HH arranged  HH-1 RN  HH-10 DISEASE MANAGEMENT  HH-2 PT      HH agency  [facility]   Status of service:  Completed, signed off Medicare Important Message given?   (If response is "NO", the following Medicare IM given date fields will be blank) Date Medicare IM given:   Date Additional Medicare IM given:    Discharge Disposition:  HOME W HOME HEALTH SERVICES  Per UR Regulation:  Reviewed for med. necessity/level of care/duration of stay  If discussed at Long Length of Stay Meetings, dates discussed:    Comments:

## 2012-07-25 NOTE — Progress Notes (Signed)
PT reports that this morning he had an episode of expressive aphasia that started around 0600am and lasted for an hour. The episode resolved at this time.  Attending physician made aware. Will continue to monitor patient.

## 2012-07-25 NOTE — Progress Notes (Addendum)
Patient ID: [name] [name]  male  ZOX:[id number]    DOB: Jul 29, 1930    DOA: 07/24/2012  PCP: Daisy Floro, MD  Assessment/Plan: Principal Problem:   TIA (transient ischemic attack): Patient having recurrent symptoms of dysarthria. His facial droop is is chronic from history of Bell's palsy - MRI brain done showed no acute intracranial abnormality or significant interval change. Remote left frontal lobe infarct, remote basal ganglia and cerebellar lacunar infarcts - MRA brain showed no significant proximal stenosis, aneurysm or branch vessel occlusion - Carotid Dopplers showed no significant ICA stenosis bilaterally - Patient has a history of paroxysmal atrial fibrillation, on xarelto. Discussed in detail with stroke team, Dr. Pearlean Brownie who followed the patient today, continue xarelto, there is no need of adding any aspirin.  - Neurology recommended EEG to rule out seizures. If positive, will start on Keppra.  - It is possible that patient's symptoms are exacerbated by dehydration and orthostasis. Recheck orthostatic vitals today were positive, BP is 106/71 on standing and 144/78 on lying. No need for calcium channel blockers or beta blockers, HR is controlled and in NSR. Patient has high output ileostomy and is prone to dehydration. He was actually started on outpatient IV fluids at home last week and PICC line was placed 3 days ago (followed by Dr. Myrle Sheng).  Active Problems:   HYPERTENSION, BENIGN SYSTEMIC: Currently borderline BP with orthostasis, antihypertensives/rate controlling meds on hold  Paroxysmal Atrial fibrillation: Continue xarelto - I discussed in detail with patient's wife, who stated that patient was recently placed on beta blocker 3 days ago when patient was in Dr. Nolon Rod office and he went into A. fib with RVR. Dr. Myrle Sheng discussed with Dr. Clifton James who advised beta blocker. Patient was on Cardizem 120 mg daily prior to this. EKG on 07/22/12 had shown atrial flutter with  heart rate of 102, and and EKG on admission 07/24/12 showed normal sinus rhythm with heart rate of 68. Patient is now orthostatic and borderline hypotensive. Patient's wife requested if Dr. Clifton James can be consulted, I have requested [facility] cards for recommendations.     Gastroparesis with GERD: Continue sucralfate with the PPI    Gastric adenocarcinoma: Currently on Xeloda and following outpatient oncology, Dr. Myrle Sheng. Discussed with patient to see his surgeon, Dr. Violeta Gelinas outpatient. He also had diarrhea last week and was seen in ED on 4/28 for dehydration and IVF.    Dehydration/  Orthostatic hypotension: Continue IV fluids  DVT Prophylaxis: On therapeutic anticoagulation with xarelto  Code Status: Full CODE STATUS  Disposition: Hopefully home if EEG is done and results are available today, otherwise in a.m.     Subjective: Episode of dysarthria earlier this morning, resolved at the time of my examination, per patient lasted for 30 minutes  Objective: Weight change:   Intake/Output Summary (Last 24 hours) at 07/25/12 1231 Last data filed at 07/25/12 0756  Gross per 24 hour  Intake    240 ml  Output   1795 ml  Net  -1555 ml   Blood pressure 106/71, pulse 73, temperature 98 F (36.7 C), temperature source Oral, resp. rate 18, height 6\' 1"  (1.854 m), weight 79.606 kg (175 lb 8 oz), SpO2 97.00%.  Physical Exam: General: Alert and awake, oriented x3, not in any acute distress. CVS: S1-S2 clear, no murmur rubs or gallops Chest: clear to auscultation bilaterally, no wheezing, rales or rhonchi Abdomen: soft nontender, nondistended, normal bowel sounds,ileostomy bag, no bleeding  Extremities: no cyanosis, clubbing or edema noted bilaterally  Neuro:  no focal neurological deficits at this time  Lab Results: Basic Metabolic Panel:  Recent Labs Lab 07/19/12 0435 07/24/12 1205 07/24/12 1218  NA 132* 134* 137  K 4.2 4.3 4.4  CL 102 105 107  CO2 24 22  --   GLUCOSE 108*  116* 115*  BUN 20 13 13   CREATININE 1.02 0.95 0.90  CALCIUM 9.7 9.5  --    Liver Function Tests:  Recent Labs Lab 07/19/12 0435 07/24/12 1205  AST 116* 22  ALT 121* 41  ALKPHOS 203* 167*  BILITOT 0.5 0.5  PROT 5.8* 5.7*  ALBUMIN 2.9* 2.6*    Recent Labs Lab 07/19/12 0435  LIPASE 27   No results found for this basename: AMMONIA,  in the last 168 hours CBC:  Recent Labs Lab 07/19/12 0435 07/24/12 1205 07/24/12 1218  WBC 8.8 8.9  --   NEUTROABS 6.9 6.1  --   HGB 10.1* 10.2* 10.2*  HCT 30.2* 28.3* 30.0*  MCV 93.5 90.7  --   PLT 295 261  --    Cardiac Enzymes:  Recent Labs Lab 07/24/12 1205  TROPONINI <0.30   BNP: No components found with this basename: POCBNP,  CBG:  Recent Labs Lab 07/24/12 1227  GLUCAP 89     Micro Results: Recent Results (from the past 240 hour(s))  CLOSTRIDIUM DIFFICILE BY PCR     Status: None   Collection Time    07/19/12  9:04 AM      Result Value Range Status   C difficile by pcr NEGATIVE  NEGATIVE Final    Studies/Results: Ct Abdomen Pelvis Wo Contrast  07/16/2012  *RADIOLOGY REPORT*  Clinical Data: Abdominal pain.  Weakness and indigestion for 2 days.  CT ABDOMEN AND PELVIS WITHOUT CONTRAST  Technique:  Multidetector CT imaging of the abdomen and pelvis was performed following the standard protocol without intravenous contrast.  Comparison: 05/30/2012  Findings: Infiltration and interstitial fibrosis in the lung bases, greater than on previous study. Coronary artery calcification.  Diffuse free fluid in the upper abdomen.  Hounsfield unit measurements are consistent with ascites.  The amount of ascites appears relatively stable since previous study. The unenhanced appearance of the liver, spleen, gallbladder, pancreas, adrenal glands, and inferior vena cava is unremarkable. There is probably residual intrahepatic bile duct dilatation although the bile ducts are not well demonstrated on unenhanced scanning.  No extrahepatic  ductal dilatation is visualized.  Hyperdense lesions demonstrated in both kidneys consistent with hemorrhagic cysts.  No hydronephrosis in either kidney.  Calcified and tortuous aorta without aneurysm.  Ectatic common iliac arteries, right measuring 1.5 cm diameter and left 1.5 cm diameter.  There is interval placement of right lower quadrant ostomy which appears to represent a diverting colostomy.  There is residual contrast material in the right colon and in the rectosigmoid colon.  The rest of the colon is mostly decompressed and cannot be evaluated for wall thickness. Residual or wall thickening is not excluded.  Small bowel loops are not distended but are diffusely fluid-filled.  Stomach is incompletely distended.  Gastric wall thickening is not excluded. No free air in the abdomen.  Diffuse mesenteric edema.  Pelvis:  Prostate gland is enlarged.  Bladder wall appears thickened which could be due to under distension, hypertrophy, or cystitis.  Mild fluid in the pelvis extending down from the abdomen.  No evidence of abscess.  Degenerative changes in the lumbar spine.  Mild anterior subluxation of L4 on L5.  IMPRESSION: There appears to  the interval placement of a diverting colostomy. Stomach, small bowel, and colon are decompressed which precludes evaluation for wall thickening. Residual inflammatory process in the stomach, small bowel, colon cannot be excluded on this examination.  No evidence of distension to suggest obstruction. Diffuse abdominal ascites appears relatively stable since previous study.  Infiltration in the lung bases.   Original Report Authenticated By: Burman Nieves, M.D.    Ct Head Wo Contrast  07/24/2012  *RADIOLOGY REPORT*  Clinical Data: Code stroke, aphasia  CT HEAD WITHOUT CONTRAST  Technique:  Contiguous axial images were obtained from the base of the skull through the vertex without contrast.  Comparison: 06/04/2012; 04/17/2012  Findings:  Redemonstrated mild diffuse atrophy with  sulcal prominence. Scattered periventricular hypodensities, most conspicuous about the anterior horn of the left lateral ventricle (image 17) and within the subcortical right frontal (image 20) and the left parietal (image 21) lobes are grossly unchanged and again compatible with microvascular ischemic disease. Given background parenchymal abnormalities, there is no CT evidence of acute large territory infarct.  No intraparenchymal or extra-axial mass or hemorrhage. Unchanged size and configuration of the ventricles and basilar cisterns.  No midline shift.  There is chronic near-complete opacification of the left maxillary sinus.  Remaining paranasal sinuses and mastoid air cells are normally aerated.  Post bilateral cataract surgery.  The regional soft tissues are normal.  No displaced calvarial fracture.  IMPRESSION: 1.  Stable findings of atrophy and microvascular ischemic disease without acute intracranial process. 2.  Chronic near-complete opacification of the left maxillary sinus.  Above findings discussed with Dr. Thad Ranger at 12:16.   Original Report Authenticated By: Tacey Ruiz, MD    Mr Angiogram Head Wo Contrast  07/24/2012  *RADIOLOGY REPORT*  Clinical Data:  Code stroke.  A aphasia.  MRI HEAD WITHOUT CONTRAST MRA HEAD WITHOUT CONTRAST  Technique:  Multiplanar, multiecho pulse sequences of the brain and surrounding structures were obtained without intravenous contrast. Angiographic images of the head were obtained using MRA technique without contrast.  Comparison:  CT head without contrast 07/24/2012.  MRI brain without contrast 03/12/2011  MRI HEAD  Findings:  The diffusion weighted images demonstrate no evidence for acute or subacute infarction.  Atrophy and diffuse white matter disease is similar to the prior exam.  A remote left frontal lobe infarct is stable.  Remote lacunar infarcts are noted in the basal ganglia bilaterally as well as the cerebellum, more prominently on the right.  The  ventricles are proportionate to the degree of atrophy.  No significant extra-axial fluid collection is present.  The patient is status post bilateral lens extractions.  Chronic left maxillary sinus opacification is evident.  The remaining paranasal sinuses are clear.  There is fluid in the mastoid air cells bilaterally, right greater than left.  No obstructing nasopharyngeal lesion is evident.  The mastoid effusions are chronic as well.  IMPRESSION:  1.  No acute intracranial abnormality or significant interval change. 2.  Stable atrophy and diffuse white matter disease. 3.  Remote left frontal lobe infarct. 4.  Remote basal ganglia and cerebellar lacunar infarcts. 5.  Chronic left maxillary sinus disease. 6.  Chronic mastoid effusions bilaterally.  No obstructing nasopharyngeal lesion is evident.  MRA HEAD  Findings: The internal carotid arteries demonstrate tortuosity of the high cervical segments bilaterally without significant stenosis.  The remainder of the internal carotid arteries are normal.  The A1 and M1 segments are normal.  The anterior communicating artery is patent.  Segmental narrowing of  the distal MCA branch vessels is similar to the prior exam.  The MCA bifurcations are within normal limits bilaterally.  The left vertebral artery is slightly dominant to the right vertebral artery.  The PICA origins are visualized and normal bilaterally.  The basilar artery is normal.  Both posterior cerebral arteries originate from basilar tip.  There is some attenuation of distal PCA branch vessels bilaterally.  IMPRESSION:  1.  No significant proximal stenosis, aneurysm, or branch vessel occlusion. 2.  Mild distal small vessel disease.   Original Report Authenticated By: Marin Roberts, M.D.    Mr Brain Wo Contrast  07/24/2012  *RADIOLOGY REPORT*  Clinical Data:  Code stroke.  A aphasia.  MRI HEAD WITHOUT CONTRAST MRA HEAD WITHOUT CONTRAST  Technique:  Multiplanar, multiecho pulse sequences of the brain  and surrounding structures were obtained without intravenous contrast. Angiographic images of the head were obtained using MRA technique without contrast.  Comparison:  CT head without contrast 07/24/2012.  MRI brain without contrast 03/12/2011  MRI HEAD  Findings:  The diffusion weighted images demonstrate no evidence for acute or subacute infarction.  Atrophy and diffuse white matter disease is similar to the prior exam.  A remote left frontal lobe infarct is stable.  Remote lacunar infarcts are noted in the basal ganglia bilaterally as well as the cerebellum, more prominently on the right.  The ventricles are proportionate to the degree of atrophy.  No significant extra-axial fluid collection is present.  The patient is status post bilateral lens extractions.  Chronic left maxillary sinus opacification is evident.  The remaining paranasal sinuses are clear.  There is fluid in the mastoid air cells bilaterally, right greater than left.  No obstructing nasopharyngeal lesion is evident.  The mastoid effusions are chronic as well.  IMPRESSION:  1.  No acute intracranial abnormality or significant interval change. 2.  Stable atrophy and diffuse white matter disease. 3.  Remote left frontal lobe infarct. 4.  Remote basal ganglia and cerebellar lacunar infarcts. 5.  Chronic left maxillary sinus disease. 6.  Chronic mastoid effusions bilaterally.  No obstructing nasopharyngeal lesion is evident.  MRA HEAD  Findings: The internal carotid arteries demonstrate tortuosity of the high cervical segments bilaterally without significant stenosis.  The remainder of the internal carotid arteries are normal.  The A1 and M1 segments are normal.  The anterior communicating artery is patent.  Segmental narrowing of the distal MCA branch vessels is similar to the prior exam.  The MCA bifurcations are within normal limits bilaterally.  The left vertebral artery is slightly dominant to the right vertebral artery.  The PICA origins are  visualized and normal bilaterally.  The basilar artery is normal.  Both posterior cerebral arteries originate from basilar tip.  There is some attenuation of distal PCA branch vessels bilaterally.  IMPRESSION:  1.  No significant proximal stenosis, aneurysm, or branch vessel occlusion. 2.  Mild distal small vessel disease.   Original Report Authenticated By: Marin Roberts, M.D.    Ir Fluoro Guide Cv Line Right  07/19/2012  *RADIOLOGY REPORT*  Indication: Poor venous access  ULTRASOUND AND FLUORSCOPIC GUIDED PICC LINE INSERTION  Intravenous Medications: None  Contrast: None  Fluoroscopy Time:  12 seconds  Complications: None immediate  Technique / Findings:  The procedure, risks, benefits, and alternatives were explained to the patient and informed written consent was obtained.  A timeout was performed prior to the initiation of the procedure.  The right upper extremity was prepped with chlorhexidine in a  sterile fashion, and a sterile drape was applied covering the operative field.  Maximum barrier sterile technique with sterile gowns and gloves were used for the procedure.  A timeout was performed prior to the initiation of the procedure.  Local anesthesia was provided with 1% lidocaine.  Under direct ultrasound guidance, the rightbasilicvein was accessed with a micropuncture kit after the overlying soft tissues were anesthetized with 1% lidocaine.  An ultrasound image was saved for documentation purposes.  A guidewire was advanced to the level of the superior caval-atrial junction for measurement purposes and the PICC line was cut to length.  A peel-away sheath was placed and a 41 cm, 5 [place], dual lumen was inserted to level of the superior caval-atrial junction.  A post procedure spot fluoroscopic was obtained.  The catheter easily aspirated and flushed and was sutured in place.  A dressing was placed.  The patient tolerated the procedure well without immediate post procedural complication.  Impression:   Successful ultrasound and fluoroscopic guided placement of a right basilic vein approach, 41 cm, 5 French,dual lumen PICC with tip at the superior caval-atrial junction.  The PICC line is ready for immediate use.   Original Report Authenticated By: Tacey Ruiz, MD    Ir US Guide Vasc Access Right  07/19/2012  *RADIOLOGY REPORT*  Indication: Poor venous access  ULTRASOUND AND FLUORSCOPIC GUIDED PICC LINE INSERTION  Intravenous Medications: None  Contrast: None  Fluoroscopy Time:  12 seconds  Complications: None immediate  Technique / Findings:  The procedure, risks, benefits, and alternatives were explained to the patient and informed written consent was obtained.  A timeout was performed prior to the initiation of the procedure.  The right upper extremity was prepped with chlorhexidine in a sterile fashion, and a sterile drape was applied covering the operative field.  Maximum barrier sterile technique with sterile gowns and gloves were used for the procedure.  A timeout was performed prior to the initiation of the procedure.  Local anesthesia was provided with 1% lidocaine.  Under direct ultrasound guidance, the rightbasilicvein was accessed with a micropuncture kit after the overlying soft tissues were anesthetized with 1% lidocaine.  An ultrasound image was saved for documentation purposes.  A guidewire was advanced to the level of the superior caval-atrial junction for measurement purposes and the PICC line was cut to length.  A peel-away sheath was placed and a 41 cm, 5 [place], dual lumen was inserted to level of the superior caval-atrial junction.  A post procedure spot fluoroscopic was obtained.  The catheter easily aspirated and flushed and was sutured in place.  A dressing was placed.  The patient tolerated the procedure well without immediate post procedural complication.  Impression:  Successful ultrasound and fluoroscopic guided placement of a right basilic vein approach, 41 cm, 5 French,dual lumen  PICC with tip at the superior caval-atrial junction.  The PICC line is ready for immediate use.   Original Report Authenticated By: Tacey Ruiz, MD     Medications: Scheduled Meds: . ferrous sulfate  325 mg Oral BID WC  . pantoprazole  40 mg Oral Daily  . psyllium  1 packet Oral BID  . Rivaroxaban  20 mg Oral QPM  . sucralfate  1 g Oral TID AC      LOS: 1 day   Jiyah Torpey M.D. [facility] 07/25/2012, 12:31 PM Pager: [phone]  If 7PM-7AM, please contact night-coverage [email or web address] Password [email or web address]

## 2012-07-25 NOTE — Progress Notes (Signed)
Utilization review completed.  

## 2012-07-25 NOTE — Progress Notes (Signed)
EEG completed.

## 2012-07-25 NOTE — Progress Notes (Signed)
VASCULAR LAB PRELIMINARY  PRELIMINARY  PRELIMINARY  PRELIMINARY  Carotid duplex has been  completed.    Preliminary report:  No significant ICA stenosis noted bilaterally (0-39%)  Janiyah Beery, RVT 07/25/2012, 10:42 AM   .

## 2012-07-25 NOTE — Telephone Encounter (Signed)
Wife reports he is still in [facility] room #3037 [facility] wing) for probable TIA. Having EEG today and carotid study. Asking if he needs to resume Xeloda? Still having some liquid output in ostomy and had some reflux symptoms last night. Per Dr. Truett Perna: Hold Xeloda as long as he is in the hospital.

## 2012-07-25 NOTE — Progress Notes (Signed)
Stroke Team Progress Note  HISTORY  [name] [name] is an 79 y.o. male who has been in the hospital often recently who was doing well at home today. Was lying on the couch to put his feet up and when he attempted to get up was noted to have difficulty getting his words out. No focal weakness was noted. Patient has had TIA's and strokes int he past. EMS was called at that time and the patient was brought in for evaluation. By the time of evaluation speech had improved significantly .  Patient has had a recent work up in January of this year. A1c was 5.7. LDL was 28. Echocardiogram was performed in March and showed an EF of 50-55% and no evidence of intracardiac masses or thrombi.   Date last known well: 07/24/2012  Time last known well: 1115   tPA Given: No: Patient on Xarelto, improvement in symptoms, recent surgery  He was admitted to the neuro floor for further evaluation and treatment.  SUBJECTIVE No further events. Family in room.discussed with wife  OBJECTIVE Most recent Vital Signs: Filed Vitals:   07/25/12 0909 07/25/12 0910 07/25/12 1100 07/25/12 1319  BP: 120/72 106/71    Pulse:    81  Temp:    98.5 F (36.9 C)  TempSrc:    Oral  Resp: 19 18  18   Height:   6\' 1"  (1.854 m)   Weight:   79.606 kg (175 lb 8 oz)   SpO2: 99% 97%  100%   CBG (last 3)   Recent Labs  07/24/12 1227  GLUCAP 89    IV Fluid Intake:   . sodium chloride 100 mL/hr at 07/24/12 1750    MEDICATIONS  . ferrous sulfate  325 mg Oral BID WC  . pantoprazole  40 mg Oral Daily  . psyllium  1 packet Oral BID  . Rivaroxaban  20 mg Oral QPM  . sucralfate  1 g Oral TID AC   PRN:  acetaminophen, gi cocktail, ondansetron, paregoric, prochlorperazine  Diet:  General thin liquids Activity:  Up as tolerated DVT Prophylaxis:  xarelto  CLINICALLY SIGNIFICANT STUDIES Basic Metabolic Panel:  Recent Labs Lab 07/19/12 0435 07/24/12 1205 07/24/12 1218  NA 132* 134* 137  K 4.2 4.3 4.4  CL 102 105 107   CO2 24 22  --   GLUCOSE 108* 116* 115*  BUN 20 13 13   CREATININE 1.02 0.95 0.90  CALCIUM 9.7 9.5  --    Liver Function Tests:  Recent Labs Lab 07/19/12 0435 07/24/12 1205  AST 116* 22  ALT 121* 41  ALKPHOS 203* 167*  BILITOT 0.5 0.5  PROT 5.8* 5.7*  ALBUMIN 2.9* 2.6*   CBC:  Recent Labs Lab 07/19/12 0435 07/24/12 1205 07/24/12 1218  WBC 8.8 8.9  --   NEUTROABS 6.9 6.1  --   HGB 10.1* 10.2* 10.2*  HCT 30.2* 28.3* 30.0*  MCV 93.5 90.7  --   PLT 295 261  --    Coagulation:  Recent Labs Lab 07/24/12 1205  LABPROT 17.4*  INR 1.47   Cardiac Enzymes:  Recent Labs Lab 07/24/12 1205  TROPONINI <0.30   Urinalysis:  Recent Labs Lab 07/19/12 0512 07/24/12 1259  COLORURINE YELLOW YELLOW  LABSPEC 1.025 1.020  PHURINE 6.0 5.0  GLUCOSEU NEGATIVE NEGATIVE  HGBUR MODERATE* NEGATIVE  BILIRUBINUR NEGATIVE NEGATIVE  KETONESUR NEGATIVE NEGATIVE  PROTEINUR NEGATIVE NEGATIVE  UROBILINOGEN 0.2 0.2  NITRITE NEGATIVE NEGATIVE  LEUKOCYTESUR NEGATIVE NEGATIVE   Lipid Panel  Component Value Date/Time   CHOL 100 04/18/2012 0610   TRIG 61 04/18/2012 0610   HDL 60 04/18/2012 0610   CHOLHDL 1.7 04/18/2012 0610   VLDL 12 04/18/2012 0610   LDLCALC 28 04/18/2012 0610   HgbA1C  Lab Results  Component Value Date   HGBA1C 5.7* 04/18/2012    Urine Drug Screen:     Component Value Date/Time   LABOPIA POSITIVE* 07/24/2012 1259   COCAINSCRNUR NONE DETECTED 07/24/2012 1259   LABBENZ NONE DETECTED 07/24/2012 1259   AMPHETMU NONE DETECTED 07/24/2012 1259   THCU POSITIVE* 07/24/2012 1259   LABBARB POSITIVE* 07/24/2012 1259    Alcohol Level:  Recent Labs Lab 07/24/12 1205  ETH <11    Ct Head Wo Contrast 07/24/2012.  Stable findings of atrophy and microvascular ischemic disease without acute intracranial process. Chronic near-complete opacification of the left maxillary sinus.  Above findings discussed with Dr. Thad Ranger at 12:16.   Mr Angiogram Head Wo Contrast 07/24/2012  MRI HEAD 1.   No acute intracranial abnormality or significant interval change. 2.  Stable atrophy and diffuse white matter disease. 3.  Remote left frontal lobe infarct. 4.  Remote basal ganglia and cerebellar lacunar infarcts. 5.  Chronic left maxillary sinus disease. 6.  Chronic mastoid effusions bilaterally.  No obstructing nasopharyngeal lesion is evident.   MRA HEAD  .No significant proximal stenosis, aneurysm, or branch vessel occlusion. 2.  Mild distal small vessel disease.     2D Echocardiogram    Carotid Doppler  No significant ICA stenosis noted bilaterally (0-39%)  CXR    EKG  Atrial flutter.   Therapy Recommendations   Physical Exam    Neurologic Examination:  Mental Status:  Alert, oriented, thought content appropriate. Speech fluent without evidence of aphasia. Able to follow 3 step commands without difficulty.  Cranial Nerves:  II: Discs flat bilaterally; Visual fields grossly normal, pupils equal, round, reactive to light and accommodation  III,IV, VI: ptosis not present, extra-ocular motions intact bilaterally  V,VII: left facial droop, facial light touch sensation normal bilaterally  VIII: hearing normal bilaterally  IX,X: gag reflex present  XI: bilateral shoulder shrug  XII: midline tongue extension  Motor:  Right : Upper extremity 5/5 Left: Upper extremity 5/5  Lower extremity 5/5 Lower extremity 5/5  Tone and bulk:normal tone throughout; no atrophy noted  Sensory: Pinprick and light touch intact throughout, bilaterally  Deep Tendon Reflexes: 2+ throughout with absent AJ's bilaterally  Plantars:  Right: downgoing Left: downgoing  Cerebellar:  normal finger-to-nose and normal heel-to-shin test  Gait: Unable to test  CV: pulses palpable throughout     ASSESSMENT [name] [name] is a 79 y.o. male presenting with dysarthria. Imaging confirms remote left frontal lobe infarct, remote basal ganglia and cerebellar lacunar infarcts. On xarelto prior to admission. Now  on xarelto for secondary stroke prevention. Patient with resultant dysarthria, concern for transient confusion. Work up underway.   Atrial flutter, on xarelto as outpatient  Hypertension   Hospital day # 1   TREATMENT/PLAN  Continue xarelto for secondary stroke prevention.  EEG ordered  Gwendolyn Lima. Manson Passey, [facility], MBA, MHA [facility] Pager: [phone] 07/25/2012 2:31 PM     07/25/2012 2:16 PM  I have personally reviewed chart, evaluated imaging results, and formulated the assessment and plan of care. I agree with the above. Delia Heady, MD

## 2012-07-25 NOTE — Progress Notes (Signed)
[facility]  Patient Status: Active (receiving services up to time of hospitalization)  [facility] is providing the following services: RN and PT Receiving IV hydration at home  If patient discharges after hours, please call [phone].   [name] [name] 07/25/2012, 11:59 AM

## 2012-07-25 NOTE — Consult Note (Signed)
Cardiology Consult Note   Patient ID: BRYOR RAMI MRN: [id number], DOB/AGE: 77-Mar-1932   Admit date: 07/24/2012 Date of Consult: 07/25/2012  Primary Physician: Daisy Floro, MD Primary Cardiologist: C. Clifton , MD  Reason for consult: evaluation/management of atrial fibrillation  HPI: [name] [name] is a 79 y.o. male with PMHx s/f PAF (on chronic Xarelto anticoagulation), h/o gastric CA s/p diverting ileostomy 06/13/12, h/o CVA/multiple TIAs and h/o Bell's palsy who was admitted to [facility] yesterday for TIA.   The patient has multiple recent admissions for TIAs. Atrial fibrillation has been managed on an outpatient basis. He was previously anticoagulated on Pradaxa which was transitioned to Xarelto which he takes currently. Rate-control includes Cardizem PO, and Toprol-XL was recently added (07/22/12) at his oncologist's office for evidence of conversion to atrial flutter with variable block, rate in the low 100s. He was then started on Toprol-XL at the recommendation of Dr. Clifton  for further rate-control. He had also recently received a PICC line for home IVF due to dehydration.  The date of admission, while at home, he had an episode of dysarthria. No focal weakness. Chronic facial droop due to Bell's palsy. EMS was called, and on arrival his symptoms had improved dramatically. He has been compliant with Xarelto. Had an episode of rectal diarrhea ~ 1 week ago- NBNB. No active bleeding. Does note significant ileostomy output.   He was admitted by the medicine service. MRI/A indicated no acute intracranial abnormality, but did reveal prior remote infarcts. No arterial stenosis, occlusion or aneurysm. Carotid u/s w/o significant stenosis. BP noted to be borderline and + orthostasis. He is receiving IVF, SBP 110-130s. Rate-control medications have been placed on hold, and cardiology consulted for further management of atrial fibrillation at the request of the patient's  wife. He has been resumed on Xarelto.   2D echo 06/04/12: LVEF 50-55%, mild LV dilatation, mild LA dilatation  Exercise Myoview 07/13/10: normal perfusion, small apical efect, preserved LVEF; ambulated 12 minutes without chest pain or EKG changes  Problem List: Past Medical History  Diagnosis Date  . Ramsay Hunt auricular syndrome   . HTN (hypertension)   . Osteoarthritis   . Dysrhythmia 04/17/12  . Anxiety   . Stroke   . H/O Bell's palsy at age 49  . Atrial flutter   . Atrial fibrillation   . Ileostomy in place 06/14/2012    Past Surgical History  Procedure Laterality Date  . Hernia repair    . Vasectomy    . Prostate biopsies      per Vonita Moss  . Cataract extraction      SE eye  . Colonoscopy N/A 06/03/2012    Procedure: COLONOSCOPY;  Surgeon: Florencia Reasons, MD;  Location: [facility] ENDOSCOPY;  Service: Endoscopy;  Laterality: N/A;  . Esophagogastroduodenoscopy N/A 06/03/2012    Procedure: ESOPHAGOGASTRODUODENOSCOPY (EGD);  Surgeon: Florencia Reasons, MD;  Location: [facility] ENDOSCOPY;  Service: Endoscopy;  Laterality: N/A;  . Laparoscopy N/A 06/13/2012    Procedure: EXPLORATORY LAPAROSCOPY ;  Surgeon: Liz Malady, MD;  Location: [facility] OR;  Service: General;  Laterality: N/A;  . Diverting ileostomy N/A 06/13/2012    Procedure: DIVERTING LOOP ILEOSTOMY;  Surgeon: Liz Malady, MD;  Location: [facility] OR;  Service: General;  Laterality: N/A;  . Lymph node biopsy Left 06/13/2012    Procedure: LYMPH NODE BIOPSY;  Surgeon: Liz Malady, MD;  Location: [facility] OR;  Service: General;  Laterality: Left;  [place] Lymph Node Biopsy     Allergies:  Allergies  Allergen Reactions  . Penicillins Rash    Rash appeared on palms of hands and feet.    Home Medications: Prior to Admission medications   Medication Sig Start Date End Date Taking? Authorizing Provider  capecitabine (XELODA) 500 MG tablet Take 1,500 mg by mouth 2 (two) times daily after a meal. Take for two weeks, then off a week , then  restart. 07/11/12 07/25/12 Yes Ladene Artist, MD  diltiazem (CARDIZEM CD) 120 MG 24 hr capsule Take 1 capsule (120 mg total) by mouth every morning. For atrial fibrillation 06/26/12  Yes Evlyn Kanner Love, PA-C  ferrous sulfate 325 (65 FE) MG tablet Take 1 tablet (325 mg total) by mouth 2 (two) times daily with a meal. 06/26/12  Yes Evlyn Kanner Love, PA-C  megestrol (MEGACE ORAL) 40 MG/ML suspension Take 5 mLs (200 mg total) by mouth 2 (two) times daily. 07/16/12 07/16/13 Yes   metoprolol succinate (TOPROL XL) 25 MG 24 hr tablet Take 1 tablet (25 mg total) by mouth daily. 07/22/12  Yes Ladene Artist, MD  Multiple Vitamins-Minerals (PRESERVISION AREDS PO) Take 1 tablet by mouth 2 (two) times daily.   Yes Historical Provider, MD  omeprazole (PRILOSEC) 20 MG capsule Take 20 mg by mouth 2 (two) times daily.  05/01/10  Yes Historical Provider, MD  ondansetron (ZOFRAN) 4 MG tablet Take 4 mg by mouth every 8 (eight) hours as needed for nausea.   Yes Historical Provider, MD  paregoric 2 MG/5ML solution Take 5 mLs by mouth 4 (four) times daily as needed for diarrhea or loose stools (may also continue to take Imodium in addition as needed). 07/19/12  Yes Ladene Artist, MD  prochlorperazine (COMPAZINE) 10 MG tablet Take 10 mg by mouth every 6 (six) hours as needed (nausea).   Yes Historical Provider, MD  psyllium (HYDROCIL/METAMUCIL) 95 % PACK Take 1 packet by mouth 2 (two) times daily.   Yes Historical Provider, MD  Rivaroxaban (XARELTO) 20 MG TABS Take 20 mg by mouth every evening.   Yes Historical Provider, MD  sucralfate (CARAFATE) 1 G tablet Take 1 g by mouth 3 (three) times daily before meals.    Yes Historical Provider, MD    Inpatient Medications:  . ferrous sulfate  325 mg Oral BID WC  . pantoprazole  40 mg Oral Daily  . psyllium  1 packet Oral BID  . Rivaroxaban  20 mg Oral QPM  . sucralfate  1 g Oral TID AC   Prescriptions prior to admission  Medication Sig Dispense Refill  . capecitabine (XELODA) 500 MG  tablet Take 1,500 mg by mouth 2 (two) times daily after a meal. Take for two weeks, then off a week , then restart.      . diltiazem (CARDIZEM CD) 120 MG 24 hr capsule Take 1 capsule (120 mg total) by mouth every morning. For atrial fibrillation  30 capsule  1  . ferrous sulfate 325 (65 FE) MG tablet Take 1 tablet (325 mg total) by mouth 2 (two) times daily with a meal.  60 tablet  1  . megestrol (MEGACE ORAL) 40 MG/ML suspension Take 5 mLs (200 mg total) by mouth 2 (two) times daily.  300 mL  2  . metoprolol succinate (TOPROL XL) 25 MG 24 hr tablet Take 1 tablet (25 mg total) by mouth daily.  30 tablet  2  . Multiple Vitamins-Minerals (PRESERVISION AREDS PO) Take 1 tablet by mouth 2 (two) times daily.      Marland Kitchen  omeprazole (PRILOSEC) 20 MG capsule Take 20 mg by mouth 2 (two) times daily.       . ondansetron (ZOFRAN) 4 MG tablet Take 4 mg by mouth every 8 (eight) hours as needed for nausea.      . paregoric 2 MG/5ML solution Take 5 mLs by mouth 4 (four) times daily as needed for diarrhea or loose stools (may also continue to take Imodium in addition as needed).  120 mL  0  . prochlorperazine (COMPAZINE) 10 MG tablet Take 10 mg by mouth every 6 (six) hours as needed (nausea).      . psyllium (HYDROCIL/METAMUCIL) 95 % PACK Take 1 packet by mouth 2 (two) times daily.      . Rivaroxaban (XARELTO) 20 MG TABS Take 20 mg by mouth every evening.      . sucralfate (CARAFATE) 1 G tablet Take 1 g by mouth 3 (three) times daily before meals.         Family History  Problem Relation Age of Onset  . Cancer Father   . Diabetes Father      History   Social History  . Marital Status: Married    Spouse Name: [name] [name]    Number of Children: 2  . Years of Education: N/A   Occupational History  . 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   Social History Main Topics  . Smoking status: Former Smoker    Quit date: 06/22/1984  . Smokeless tobacco: Never Used  . Alcohol Use: 0.0 oz/week      Comment: 1 glass of wine a day  . Drug Use: No  . Sexually Active: Not on file   Other Topics Concern  . Not on file   Social History Narrative   Health Care POA: wife, [name] [name]   Emergency Contact: [name] [name], (c) [phone]   End of Life Plan: DNR   Who lives with you: Lives with wife, [name] [name]   Any pets: coon hound, [name] [name]   Diet: Patient has a varied diet of protein, starch, vegetables.   Exercise: Patient walks or does yoga 5 x a week.   Seatbelts: Patient reports wearing a seat belt when in vehicle.   Sun Exposure/Protection: Patient reports infrequent use of sun screen.   Hobbies: Walking, running, politics           Review of Systems: General: positive for weakness, fatigue, negative for chills, fever, night sweats or weight changes.  Cardiovascular: negative for chest pain, dyspnea on exertion, edema, orthopnea, palpitations, paroxysmal nocturnal dyspnea or shortness of breath Dermatological: negative for rash Respiratory: negative for cough or wheezing Urologic: negative for hematuria Abdominal: positive for NBNB diarrhea from rectum ~ 1 week ago, negative for nausea, vomiting, diarrhea, bright red blood per rectum, melena, or hematemesis Neurologic: negative for visual changes, syncope, or dizziness All other systems reviewed and are otherwise negative except as noted above.  Physical Exam: Blood pressure 106/71, pulse 81, temperature 98.5 F (36.9 C), temperature source Oral, resp. rate 18, height 6\' 1"  (1.854 m), weight 79.606 kg (175 lb 8 oz), SpO2 100.00%.    General: Thin, well developed, in no acute distress. Head: Normocephalic, atraumatic, sclera non-icteric, no xanthomas, nares are without discharge, left facial droop  Neck: Negative for carotid bruits. JVD not elevated. Lungs: Clear bilaterally to auscultation without wheezes, rales, or rhonchi. Breathing is unlabored. Heart: RRR with S1 S2. No murmurs, rubs, or gallops appreciated. Abdomen: Soft,  non-tender, non-distended with normoactive bowel sounds. No  hepatomegaly. No rebound/guarding. No obvious abdominal masses. Ileostomy bag noted. No erythema or tenderness of ostomy. Msk:  Strength and tone appears normal for age. Extremities: No clubbing, cyanosis or edema.  Distal pedal pulses are 2+ and equal bilaterally. Neuro: Alert and oriented X 3. Moves all extremities spontaneously. Psych:  Responds to questions appropriately with a normal affect.  Labs: Recent Labs     07/24/12  1205  07/24/12  1218  WBC  8.9   --   HGB  10.2*  10.2*  HCT  28.3*  30.0*  MCV  90.7   --   PLT  261   --    Recent Labs Lab 07/19/12 0435 07/24/12 1205 07/24/12 1218  NA 132* 134* 137  K 4.2 4.3 4.4  CL 102 105 107  CO2 24 22  --   BUN 20 13 13   CREATININE 1.02 0.95 0.90  CALCIUM 9.7 9.5  --   PROT 5.8* 5.7*  --   BILITOT 0.5 0.5  --   ALKPHOS 203* 167*  --   ALT 121* 41  --   AST 116* 22  --   LIPASE 27  --   --   GLUCOSE 108* 116* 115*   Recent Labs     07/24/12  1205  TROPONINI  <0.30   Radiology/Studies: Ct Abdomen Pelvis Wo Contrast  07/16/2012  *RADIOLOGY REPORT*  Clinical Data: Abdominal pain.  Weakness and indigestion for 2 days.  CT ABDOMEN AND PELVIS WITHOUT CONTRAST  Technique:  Multidetector CT imaging of the abdomen and pelvis was performed following the standard protocol without intravenous contrast.  Comparison: 05/30/2012  Findings: Infiltration and interstitial fibrosis in the lung bases, greater than on previous study. Coronary artery calcification.  Diffuse free fluid in the upper abdomen.  Hounsfield unit measurements are consistent with ascites.  The amount of ascites appears relatively stable since previous study. The unenhanced appearance of the liver, spleen, gallbladder, pancreas, adrenal glands, and inferior vena cava is unremarkable. There is probably residual intrahepatic bile duct dilatation although the bile ducts are not well demonstrated on unenhanced  scanning.  No extrahepatic ductal dilatation is visualized.  Hyperdense lesions demonstrated in both kidneys consistent with hemorrhagic cysts.  No hydronephrosis in either kidney.  Calcified and tortuous aorta without aneurysm.  Ectatic common iliac arteries, right measuring 1.5 cm diameter and left 1.5 cm diameter.  There is interval placement of right lower quadrant ostomy which appears to represent a diverting colostomy.  There is residual contrast material in the right colon and in the rectosigmoid colon.  The rest of the colon is mostly decompressed and cannot be evaluated for wall thickness. Residual or wall thickening is not excluded.  Small bowel loops are not distended but are diffusely fluid-filled.  Stomach is incompletely distended.  Gastric wall thickening is not excluded. No free air in the abdomen.  Diffuse mesenteric edema.  Pelvis:  Prostate gland is enlarged.  Bladder wall appears thickened which could be due to under distension, hypertrophy, or cystitis.  Mild fluid in the pelvis extending down from the abdomen.  No evidence of abscess.  Degenerative changes in the lumbar spine.  Mild anterior subluxation of L4 on L5.  IMPRESSION: There appears to the interval placement of a diverting colostomy. Stomach, small bowel, and colon are decompressed which precludes evaluation for wall thickening. Residual inflammatory process in the stomach, small bowel, colon cannot be excluded on this examination.  No evidence of distension to suggest obstruction. Diffuse abdominal ascites  appears relatively stable since previous study.  Infiltration in the lung bases.   Original Report Authenticated By: Burman Nieves, M.D.    Ct Head Wo Contrast  07/24/2012  *RADIOLOGY REPORT*  Clinical Data: Code stroke, aphasia  CT HEAD WITHOUT CONTRAST  Technique:  Contiguous axial images were obtained from the base of the skull through the vertex without contrast.  Comparison: 06/04/2012; 04/17/2012  Findings:   Redemonstrated mild diffuse atrophy with sulcal prominence. Scattered periventricular hypodensities, most conspicuous about the anterior horn of the left lateral ventricle (image 17) and within the subcortical right frontal (image 20) and the left parietal (image 21) lobes are grossly unchanged and again compatible with microvascular ischemic disease. Given background parenchymal abnormalities, there is no CT evidence of acute large territory infarct.  No intraparenchymal or extra-axial mass or hemorrhage. Unchanged size and configuration of the ventricles and basilar cisterns.  No midline shift.  There is chronic near-complete opacification of the left maxillary sinus.  Remaining paranasal sinuses and mastoid air cells are normally aerated.  Post bilateral cataract surgery.  The regional soft tissues are normal.  No displaced calvarial fracture.  IMPRESSION: 1.  Stable findings of atrophy and microvascular ischemic disease without acute intracranial process. 2.  Chronic near-complete opacification of the left maxillary sinus.  Above findings discussed with Dr. Thad Ranger at 12:16.   Original Report Authenticated By: Tacey Ruiz, MD    Mr Angiogram Head Wo Contrast  07/24/2012  *RADIOLOGY REPORT*  Clinical Data:  Code stroke.  A aphasia.  MRI HEAD WITHOUT CONTRAST MRA HEAD WITHOUT CONTRAST  Technique:  Multiplanar, multiecho pulse sequences of the brain and surrounding structures were obtained without intravenous contrast. Angiographic images of the head were obtained using MRA technique without contrast.  Comparison:  CT head without contrast 07/24/2012.  MRI brain without contrast 03/12/2011  MRI HEAD  Findings:  The diffusion weighted images demonstrate no evidence for acute or subacute infarction.  Atrophy and diffuse white matter disease is similar to the prior exam.  A remote left frontal lobe infarct is stable.  Remote lacunar infarcts are noted in the basal ganglia bilaterally as well as the cerebellum,  more prominently on the right.  The ventricles are proportionate to the degree of atrophy.  No significant extra-axial fluid collection is present.  The patient is status post bilateral lens extractions.  Chronic left maxillary sinus opacification is evident.  The remaining paranasal sinuses are clear.  There is fluid in the mastoid air cells bilaterally, right greater than left.  No obstructing nasopharyngeal lesion is evident.  The mastoid effusions are chronic as well.  IMPRESSION:  1.  No acute intracranial abnormality or significant interval change. 2.  Stable atrophy and diffuse white matter disease. 3.  Remote left frontal lobe infarct. 4.  Remote basal ganglia and cerebellar lacunar infarcts. 5.  Chronic left maxillary sinus disease. 6.  Chronic mastoid effusions bilaterally.  No obstructing nasopharyngeal lesion is evident.  MRA HEAD  Findings: The internal carotid arteries demonstrate tortuosity of the high cervical segments bilaterally without significant stenosis.  The remainder of the internal carotid arteries are normal.  The A1 and M1 segments are normal.  The anterior communicating artery is patent.  Segmental narrowing of the distal MCA branch vessels is similar to the prior exam.  The MCA bifurcations are within normal limits bilaterally.  The left vertebral artery is slightly dominant to the right vertebral artery.  The PICA origins are visualized and normal bilaterally.  The basilar artery  is normal.  Both posterior cerebral arteries originate from basilar tip.  There is some attenuation of distal PCA branch vessels bilaterally.  IMPRESSION:  1.  No significant proximal stenosis, aneurysm, or branch vessel occlusion. 2.  Mild distal small vessel disease.   Original Report Authenticated By: Marin Roberts, M.D.    Mr Brain Wo Contrast  07/24/2012  *RADIOLOGY REPORT*  Clinical Data:  Code stroke.  A aphasia.  MRI HEAD WITHOUT CONTRAST MRA HEAD WITHOUT CONTRAST  Technique:  Multiplanar,  multiecho pulse sequences of the brain and surrounding structures were obtained without intravenous contrast. Angiographic images of the head were obtained using MRA technique without contrast.  Comparison:  CT head without contrast 07/24/2012.  MRI brain without contrast 03/12/2011  MRI HEAD  Findings:  The diffusion weighted images demonstrate no evidence for acute or subacute infarction.  Atrophy and diffuse white matter disease is similar to the prior exam.  A remote left frontal lobe infarct is stable.  Remote lacunar infarcts are noted in the basal ganglia bilaterally as well as the cerebellum, more prominently on the right.  The ventricles are proportionate to the degree of atrophy.  No significant extra-axial fluid collection is present.  The patient is status post bilateral lens extractions.  Chronic left maxillary sinus opacification is evident.  The remaining paranasal sinuses are clear.  There is fluid in the mastoid air cells bilaterally, right greater than left.  No obstructing nasopharyngeal lesion is evident.  The mastoid effusions are chronic as well.  IMPRESSION:  1.  No acute intracranial abnormality or significant interval change. 2.  Stable atrophy and diffuse white matter disease. 3.  Remote left frontal lobe infarct. 4.  Remote basal ganglia and cerebellar lacunar infarcts. 5.  Chronic left maxillary sinus disease. 6.  Chronic mastoid effusions bilaterally.  No obstructing nasopharyngeal lesion is evident.  MRA HEAD  Findings: The internal carotid arteries demonstrate tortuosity of the high cervical segments bilaterally without significant stenosis.  The remainder of the internal carotid arteries are normal.  The A1 and M1 segments are normal.  The anterior communicating artery is patent.  Segmental narrowing of the distal MCA branch vessels is similar to the prior exam.  The MCA bifurcations are within normal limits bilaterally.  The left vertebral artery is slightly dominant to the right  vertebral artery.  The PICA origins are visualized and normal bilaterally.  The basilar artery is normal.  Both posterior cerebral arteries originate from basilar tip.  There is some attenuation of distal PCA branch vessels bilaterally.  IMPRESSION:  1.  No significant proximal stenosis, aneurysm, or branch vessel occlusion. 2.  Mild distal small vessel disease.   Original Report Authenticated By: Marin Roberts, M.D.    Ir Fluoro Guide Cv Line Right  07/19/2012  *RADIOLOGY REPORT*  Indication: Poor venous access  ULTRASOUND AND FLUORSCOPIC GUIDED PICC LINE INSERTION  Intravenous Medications: None  Contrast: None  Fluoroscopy Time:  12 seconds  Complications: None immediate  Technique / Findings:  The procedure, risks, benefits, and alternatives were explained to the patient and informed written consent was obtained.  A timeout was performed prior to the initiation of the procedure.  The right upper extremity was prepped with chlorhexidine in a sterile fashion, and a sterile drape was applied covering the operative field.  Maximum barrier sterile technique with sterile gowns and gloves were used for the procedure.  A timeout was performed prior to the initiation of the procedure.  Local anesthesia was provided with 1%  lidocaine.  Under direct ultrasound guidance, the rightbasilicvein was accessed with a micropuncture kit after the overlying soft tissues were anesthetized with 1% lidocaine.  An ultrasound image was saved for documentation purposes.  A guidewire was advanced to the level of the superior caval-atrial junction for measurement purposes and the PICC line was cut to length.  A peel-away sheath was placed and a 41 cm, 5 [place], dual lumen was inserted to level of the superior caval-atrial junction.  A post procedure spot fluoroscopic was obtained.  The catheter easily aspirated and flushed and was sutured in place.  A dressing was placed.  The patient tolerated the procedure well without immediate  post procedural complication.  Impression:  Successful ultrasound and fluoroscopic guided placement of a right basilic vein approach, 41 cm, 5 French,dual lumen PICC with tip at the superior caval-atrial junction.  The PICC line is ready for immediate use.   Original Report Authenticated By: Tacey Ruiz, MD    Ir US Guide Vasc Access Right  07/19/2012  *RADIOLOGY REPORT*  Indication: Poor venous access  ULTRASOUND AND FLUORSCOPIC GUIDED PICC LINE INSERTION  Intravenous Medications: None  Contrast: None  Fluoroscopy Time:  12 seconds  Complications: None immediate  Technique / Findings:  The procedure, risks, benefits, and alternatives were explained to the patient and informed written consent was obtained.  A timeout was performed prior to the initiation of the procedure.  The right upper extremity was prepped with chlorhexidine in a sterile fashion, and a sterile drape was applied covering the operative field.  Maximum barrier sterile technique with sterile gowns and gloves were used for the procedure.  A timeout was performed prior to the initiation of the procedure.  Local anesthesia was provided with 1% lidocaine.  Under direct ultrasound guidance, the rightbasilicvein was accessed with a micropuncture kit after the overlying soft tissues were anesthetized with 1% lidocaine.  An ultrasound image was saved for documentation purposes.  A guidewire was advanced to the level of the superior caval-atrial junction for measurement purposes and the PICC line was cut to length.  A peel-away sheath was placed and a 41 cm, 5 [place], dual lumen was inserted to level of the superior caval-atrial junction.  A post procedure spot fluoroscopic was obtained.  The catheter easily aspirated and flushed and was sutured in place.  A dressing was placed.  The patient tolerated the procedure well without immediate post procedural complication.  Impression:  Successful ultrasound and fluoroscopic guided placement of a right basilic  vein approach, 41 cm, 5 French,dual lumen PICC with tip at the superior caval-atrial junction.  The PICC line is ready for immediate use.   Original Report Authenticated By: Tacey Ruiz, MD     EKG 07/24/12: NSR, 68 bpm, PACs, IVCD III, aVF, no ST/T changes Telemetry: NSR, 60-80 bpm occasional PVC/PACs  ASSESSMENT AND PLAN:   79 y.o. male with PMHx s/f PAF (on chronic Xarelto anticoagulation), h/o gastric CA s/p diverting ileostomy 06/13/12, h/o CVA/multiple TIAs and h/o Bell's palsy who was admitted to [facility] yesterday for TIA.   1. TIA/hypotension 2. PAF 3. H/o gastric CA s/p ileostomy 4. Bell's palsy 5. H/o CVA  Signed, R. Hurman Horn, PA-C 07/25/2012, 3:45 PM  History and all data above reviewed.  Patient examined.  I agree with the findings as above.  He has had intermittent dehydration with increased gastric output.  At these times he will get hypotensive.  He was dehydrated on admission.  At this  moment his ileostomy output is more solid and BP is up.  He has had no further symptomatic atrial fib. The patient exam reveals COR:RRR  ,  Lungs: Clear  ,  Abd: Positive bowel sounds, no rebound no guarding, Ext No edema  .  All available labs, radiology testing, previous records reviewed. Agree with documented assessment and plan. I had a discussion with the patient and his wife about restarting the Cardizem but holding this if he has light headedness or low BP on a given day.  He and his wife are very educated about these issues and can hold meds as needed.    [name] [name]  4:54 PM  07/25/2012

## 2012-07-26 ENCOUNTER — Telehealth: Payer: Self-pay | Admitting: Medical Oncology

## 2012-07-26 ENCOUNTER — Telehealth: Payer: Self-pay | Admitting: *Deleted

## 2012-07-26 DIAGNOSIS — R630 Anorexia: Secondary | ICD-10-CM

## 2012-07-26 DIAGNOSIS — G459 Transient cerebral ischemic attack, unspecified: Secondary | ICD-10-CM

## 2012-07-26 DIAGNOSIS — C169 Malignant neoplasm of stomach, unspecified: Secondary | ICD-10-CM

## 2012-07-26 DIAGNOSIS — K5730 Diverticulosis of large intestine without perforation or abscess without bleeding: Secondary | ICD-10-CM

## 2012-07-26 MED ORDER — HEPARIN SOD (PORK) LOCK FLUSH 100 UNIT/ML IV SOLN
250.0000 [IU] | INTRAVENOUS | Status: AC | PRN
  Administered 2012-07-26: 500 [IU]

## 2012-07-26 NOTE — Telephone Encounter (Signed)
Per pt's wife request; called and spoke with her confirming per MD pt to receive 1 bag fluid each day.  Pt's wife verbalized understanding.

## 2012-07-26 NOTE — Procedures (Signed)
EEG NUMBER:  REFERRING PHYSICIAN:  Dr. Pearlean Brownie.  HISTORY:  A 79 year old male presenting with episodes of expressive aphasia and evaluated to rule out seizures.  MEDICATIONS:  Xeloda, iron, Zofran, Protonix, paregoric, Compazine, psyllium, Xarelto, Carafate.  CONDITIONS OF RECORDING:  This is a 16-channel EEG carried out with the patient in the awake and drowsy states.  DESCRIPTION:  The waking background activity consists of a low-voltage, symmetrical, fairly well-organized 8-9 Hz alpha activity seen from the parieto-occipital and posterotemporal regions.  Low-voltage, fast activity, poorly organized is seen anteriorly, and at times, superimposed on more posterior rhythms.  A mixture of theta and alpha rhythm was seen from the central and temporal regions.  The patient drowses with slowing to irregular, low-voltage theta and beta activity. Stage II sleep is not obtained.  Hyperventilation was not performed.  Intermittent photic stimulation failed to elicit any change in the tracing.  IMPRESSION:  This is a normal EEG.          ______________________________ Thana Farr, MD    [id number] D:  07/26/2012 07:28:31  T:  07/26/2012 08:42:58  Job #:  [id number]

## 2012-07-26 NOTE — Progress Notes (Signed)
Stroke Team Progress Note  HISTORY  [name] [name] is an 79 y.o. male who has been in the hospital often recently who was doing well at home today. Was lying on the couch to put his feet up and when he attempted to get up was noted to have difficulty getting his words out. No focal weakness was noted. Patient has had TIA's and strokes int he past. EMS was called at that time and the patient was brought in for evaluation. By the time of evaluation speech had improved significantly .  Patient has had a recent work up in January of this year. A1c was 5.7. LDL was 28. Echocardiogram was performed in March and showed an EF of 50-55% and no evidence of intracardiac masses or thrombi.   Date last known well: 07/24/2012  Time last known well: 1115   tPA Given: No: Patient on Xarelto, improvement in symptoms, recent surgery  He was admitted to the neuro floor for further evaluation and treatment.  SUBJECTIVE No further events. Family in room. Treatment options and disease processes discussed with patient and wife.  OBJECTIVE Most recent Vital Signs: Filed Vitals:   07/25/12 2100 07/26/12 0500 07/26/12 0503 07/26/12 0506  BP: 117/76 132/77 119/76 92/60  Pulse: 71 68 76 77  Temp: 98 F (36.7 C) 98 F (36.7 C)    TempSrc:      Resp: 20 18    Height:      Weight:      SpO2: 98% 95%     CBG (last 3)   Recent Labs  07/24/12 1227  GLUCAP 89    IV Fluid Intake:   . sodium chloride 100 mL/hr at 07/24/12 1750    MEDICATIONS  . diltiazem  120 mg Oral Daily  . ferrous sulfate  325 mg Oral BID WC  . pantoprazole  40 mg Oral Daily  . psyllium  1 packet Oral BID  . Rivaroxaban  20 mg Oral QPM  . sucralfate  1 g Oral TID AC   PRN:  acetaminophen, gi cocktail, ondansetron, paregoric, prochlorperazine  Diet:  General thin liquids Activity:  Up as tolerated DVT Prophylaxis:  xarelto  CLINICALLY SIGNIFICANT STUDIES Basic Metabolic Panel:   Recent Labs Lab 07/24/12 1205  07/24/12 1218  NA 134* 137  K 4.3 4.4  CL 105 107  CO2 22  --   GLUCOSE 116* 115*  BUN 13 13  CREATININE 0.95 0.90  CALCIUM 9.5  --    Liver Function Tests:   Recent Labs Lab 07/24/12 1205  AST 22  ALT 41  ALKPHOS 167*  BILITOT 0.5  PROT 5.7*  ALBUMIN 2.6*   CBC:   Recent Labs Lab 07/24/12 1205 07/24/12 1218  WBC 8.9  --   NEUTROABS 6.1  --   HGB 10.2* 10.2*  HCT 28.3* 30.0*  MCV 90.7  --   PLT 261  --    Coagulation:   Recent Labs Lab 07/24/12 1205  LABPROT 17.4*  INR 1.47   Cardiac Enzymes:   Recent Labs Lab 07/24/12 1205  TROPONINI <0.30   Urinalysis:   Recent Labs Lab 07/24/12 1259  COLORURINE YELLOW  LABSPEC 1.020  PHURINE 5.0  GLUCOSEU NEGATIVE  HGBUR NEGATIVE  BILIRUBINUR NEGATIVE  KETONESUR NEGATIVE  PROTEINUR NEGATIVE  UROBILINOGEN 0.2  NITRITE NEGATIVE  LEUKOCYTESUR NEGATIVE   Lipid Panel    Component Value Date/Time   CHOL 100 04/18/2012 0610   TRIG 61 04/18/2012 0610   HDL 60 04/18/2012 0610  CHOLHDL 1.7 04/18/2012 0610   VLDL 12 04/18/2012 0610   LDLCALC 28 04/18/2012 0610   HgbA1C  Lab Results  Component Value Date   HGBA1C 5.7* 04/18/2012    Urine Drug Screen:     Component Value Date/Time   LABOPIA POSITIVE* 07/24/2012 1259   COCAINSCRNUR NONE DETECTED 07/24/2012 1259   LABBENZ NONE DETECTED 07/24/2012 1259   AMPHETMU NONE DETECTED 07/24/2012 1259   THCU POSITIVE* 07/24/2012 1259   LABBARB POSITIVE* 07/24/2012 1259    Alcohol Level:   Recent Labs Lab 07/24/12 1205  ETH <11    Ct Head Wo Contrast 07/24/2012.  Stable findings of atrophy and microvascular ischemic disease without acute intracranial process. Chronic near-complete opacification of the left maxillary sinus.  Above findings discussed with Dr. Thad Ranger at 12:16.   Mr Angiogram Head Wo Contrast 07/24/2012  MRI HEAD 1.  No acute intracranial abnormality or significant interval change. 2.  Stable atrophy and diffuse white matter disease. 3.  Remote left frontal  lobe infarct. 4.  Remote basal ganglia and cerebellar lacunar infarcts. 5.  Chronic left maxillary sinus disease. 6.  Chronic mastoid effusions bilaterally.  No obstructing nasopharyngeal lesion is evident.   MRA HEAD  .No significant proximal stenosis, aneurysm, or branch vessel occlusion. 2.  Mild distal small vessel disease.     2D Echocardiogram  06/04/2012 55%. Systolic fxn normal, LA mildly dilated.  Carotid Doppler  No significant ICA stenosis noted bilaterally (0-39%)  CXR  Complete clearing of the left lower lobe pneumonia.  EEG---  EKG  Atrial flutter.   Therapy Recommendations   Physical Exam    Neurologic Examination:  Mental Status:  Alert, oriented, thought content appropriate. Speech fluent without evidence of aphasia. Able to follow 3 step commands without difficulty.  Cranial Nerves:  II: Discs flat bilaterally; Visual fields grossly normal, pupils equal, round, reactive to light and accommodation  III,IV, VI: ptosis not present, extra-ocular motions intact bilaterally  V,VII: left lower motor neuron pattern facial weakness including forehead, facial light touch sensation normal bilaterally  VIII: hearing normal bilaterally  IX,X: gag reflex present  XI: bilateral shoulder shrug  XII: midline tongue extension  Motor:  Right : Upper extremity 5/5 Left: Upper extremity 5/5  Lower extremity 5/5 Lower extremity 5/5  Tone and bulk:normal tone throughout; no atrophy noted  Sensory: Pinprick and light touch intact throughout, bilaterally  Deep Tendon Reflexes: 2+ throughout with absent AJ's bilaterally  Plantars:  Right: downgoing Left: downgoing  Cerebellar:  normal finger-to-nose and normal heel-to-shin test  Gait: Unable to test  CV: pulses palpable throughout     ASSESSMENT [name] [name] is a 79 y.o. male presenting with dysarthria. Imaging confirms remote left frontal lobe infarct, remote basal ganglia and cerebellar lacunar infarcts. On xarelto  prior to admission. Now on xarelto for secondary stroke prevention. Patient with resultant dysarthria, concern for transient confusion. Work up underway.   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  EEG normal  Have patient follow up with Neurology in 2 months.  Gwendolyn Lima. Manson Passey, [facility], MBA, MHA [facility] Pager: [phone] 07/26/2012 8:05 AM  I have personally reviewed chart, evaluated imaging results, and formulated the assessment and plan of care. I agree with the above.  Delia Heady, MD

## 2012-07-26 NOTE — Telephone Encounter (Signed)
I called [facility] and spoke to [facility] Per Dr. Truett Perna give l liter D5NS over 4 hours daily  through 5/15 ( appt with Dr Truett Perna) at which time he will revaluate need to continue IVF. If pt has increased diarrhea call MD for other orders.

## 2012-07-26 NOTE — Discharge Summary (Signed)
Physician Discharge Summary  Patient ID: [name] [name] MRN: [id number] DOB/AGE: [birth year] 79 y.o.  Admit date: 07/24/2012 Discharge date: 07/26/2012  Primary Care Physician:  Daisy Floro, MD  Discharge Diagnoses:    . Atrial fibrillation . TIA (transient ischemic attack) . HYPERTENSION, BENIGN SYSTEMIC . Dehydration . Orthostatic hypotension . Gastroparesis . Gastric adenocarcinoma  Consults:  Neurology                    Cardiology, [place], Dr. Antoine Poche   Recommendations for Outpatient Follow-up:  1 ) Please check BP 2-3 times/day.  Patient was advised that if he is feeling dizzy and systolic BP is in low 90's-100's, and not to take cardizem that day. He has a followup appointment with Dr. Clifton James in office.  2) patient is to call Dr. Kalman Drape office to resume Xeloda 3) I also recommended him to continue IV fluids outpatient as recommended by Dr. Truett Perna. Patient has a PICC line in right arm.   Allergies:   Allergies  Allergen Reactions  . Penicillins Rash    Rash appeared on palms of hands and feet.     Discharge Medications:   Medication List    STOP taking these medications       capecitabine 500 MG tablet  Commonly known as:  XELODA     metoprolol succinate 25 MG 24 hr tablet  Commonly known as:  TOPROL XL      TAKE these medications       diltiazem 120 MG 24 hr capsule  Commonly known as:  CARDIZEM CD  Take 1 capsule (120 mg total) by mouth every morning. For atrial fibrillation     ferrous sulfate 325 (65 FE) MG tablet  Take 1 tablet (325 mg total) by mouth 2 (two) times daily with a meal.     MEGACE ORAL 40 MG/ML suspension  Generic drug:  megestrol  Take 5 mLs (200 mg total) by mouth 2 (two) times daily.     omeprazole 20 MG capsule  Commonly known as:  PRILOSEC  Take 20 mg by mouth 2 (two) times daily.     ondansetron 4 MG tablet  Commonly known as:  ZOFRAN  Take 4 mg by mouth every 8 (eight) hours as needed for nausea.      paregoric 2 MG/5ML solution  Take 5 mLs by mouth 4 (four) times daily as needed for diarrhea or loose stools (may also continue to take Imodium in addition as needed).     PRESERVISION AREDS PO  Take 1 tablet by mouth 2 (two) times daily.     prochlorperazine 10 MG tablet  Commonly known as:  COMPAZINE  Take 10 mg by mouth every 6 (six) hours as needed (nausea).     psyllium 95 % Pack  Commonly known as:  HYDROCIL/METAMUCIL  Take 1 packet by mouth 2 (two) times daily.     Rivaroxaban 20 MG Tabs  Commonly known as:  XARELTO  Take 20 mg by mouth every evening.     sucralfate 1 G tablet  Commonly known as:  CARAFATE  Take 1 g by mouth 3 (three) times daily before meals.         Brief H and P: For complete details please refer to admission H and P, but in brief [name] [name] is a 79 y.o. male with gastric cancer who presented to the Ed with difficulty speaking. MRI was negative for acute stroke - symptoms improved fast. He has been recently  diagnosed with afib with RVR and is taking xarelto. He also has a ileostomy and requires iv fluids at night - not to get dehydrated.   Hospital Course:  TIA (transient ischemic attack): Patient was having recurrent symptoms of dysarthria, currently resolved. His facial droop is chronic from history of Bell's palsy  - MRI brain done in showed no acute intracranial abnormality or significant interval change. Remote left frontal lobe infarct, remote basal ganglia and cerebellar lacunar infarcts  - MRA brain showed no significant proximal stenosis, aneurysm or branch vessel occlusion  - Carotid Dopplers showed no significant ICA stenosis bilaterally  - Patient has a history of paroxysmal atrial fibrillation, on xarelto. Neurology was consulted and patient was followed by stroke team who did not recommend addition of any other anti-platelet agent.  - EEG was done and was negative for any seizures. - It is possible that patient's symptoms are being  exacerbated by dehydration and orthostasis. Orthostatic vitals were positive hands calcium channel blocker and beta blocker was held. The beta blockers for recently started a few days ago by cardiology, Dr. Clifton James. Patient has high output ileostomy and is prone to dehydration. He was actually started on outpatient IV fluids at home last week and PICC line was placed on 07/19/2012.     HYPERTENSION, BENIGN SYSTEMIC: Currently stable, patient was restarted on Cardizem with instructions to hold when his BP is low or feeling dizzy or lightheaded  Paroxysmal Atrial fibrillation: Continue xarelto  - I discussed in detail with patient's wife, who stated that patient was recently placed on beta blocker few days ago when patient was in Dr. Nolon Rod office and he went into A. fib with RVR. Dr. Myrle Sheng discussed with Dr. Clifton James who advised beta blocker. Patient was on Cardizem 120 mg daily prior to this. EKG on 07/22/12 had shown atrial flutter with heart rate of 102, and and EKG on admission 07/24/12 showed normal sinus rhythm with heart rate of 68. Patient on admission was orthostatic and borderline hypotensive. The bowel cardiology was consulted and patient was seen by Dr. Antoine Poche who recommended restarting Cardizem however with parameters. Advised patient to hold Cardizem if BP is low 90-low100's or he's feeling dizzy or lightheaded.  Gastroparesis with GERD: Continue sucralfate with the PPI   Gastric adenocarcinoma: Currently on Xeloda and following outpatient oncology, Dr. Myrle Sheng. Discussed with patient to see his surgeon, Dr. Violeta Gelinas outpatient. He also had diarrhea last week and was seen in ED on 4/28 for dehydration and IVF.   Dehydration/ Orthostatic hypotension: Continue IV fluids outpatient as well. Resume home PT and RN.  Day of Discharge BP 92/60  Pulse 77  Temp(Src) 98 F (36.7 C) (Oral)  Resp 18  Ht 6\' 1"  (1.854 m)  Wt 79.606 kg (175 lb 8 oz)  BMI 23.16 kg/m2  SpO2  95%  Physical Exam: General: Alert and awake oriented x3 not in any acute distress. HEENT: anicteric sclera, pupils reactive to light and accommodation CVS: S1-S2 clear no murmur rubs or gallops Chest: clear to auscultation bilaterally, no wheezing rales or rhonchi Abdomen: soft nontender, nondistended, normal bowel sounds, no organomegaly Extremities: no cyanosis, clubbing or edema noted bilaterally Neuro: Cranial nerves II-XII intact, no focal neurological deficits   The results of significant diagnostics from this hospitalization (including imaging, microbiology, ancillary and laboratory) are listed below for reference.    LAB RESULTS: Basic Metabolic Panel:  Recent Labs Lab 07/24/12 1205 07/24/12 1218  NA 134* 137  K 4.3 4.4  CL  105 107  CO2 22  --   GLUCOSE 116* 115*  BUN 13 13  CREATININE 0.95 0.90  CALCIUM 9.5  --    Liver Function Tests:  Recent Labs Lab 07/24/12 1205  AST 22  ALT 41  ALKPHOS 167*  BILITOT 0.5  PROT 5.7*  ALBUMIN 2.6*   No results found for this basename: LIPASE, AMYLASE,  in the last 168 hours No results found for this basename: AMMONIA,  in the last 168 hours CBC:  Recent Labs Lab 07/24/12 1205 07/24/12 1218  WBC 8.9  --   NEUTROABS 6.1  --   HGB 10.2* 10.2*  HCT 28.3* 30.0*  MCV 90.7  --   PLT 261  --    Cardiac Enzymes:  Recent Labs Lab 07/24/12 1205  TROPONINI <0.30   BNP: No components found with this basename: POCBNP,  CBG:  Recent Labs Lab 07/24/12 1227  GLUCAP 89    Significant Diagnostic Studies:  Ct Head Wo Contrast  07/24/2012  *RADIOLOGY REPORT*  Clinical Data: Code stroke, aphasia  CT HEAD WITHOUT CONTRAST  Technique:  Contiguous axial images were obtained from the base of the skull through the vertex without contrast.  Comparison: 06/04/2012; 04/17/2012  Findings:  Redemonstrated mild diffuse atrophy with sulcal prominence. Scattered periventricular hypodensities, most conspicuous about the anterior  horn of the left lateral ventricle (image 17) and within the subcortical right frontal (image 20) and the left parietal (image 21) lobes are grossly unchanged and again compatible with microvascular ischemic disease. Given background parenchymal abnormalities, there is no CT evidence of acute large territory infarct.  No intraparenchymal or extra-axial mass or hemorrhage. Unchanged size and configuration of the ventricles and basilar cisterns.  No midline shift.  There is chronic near-complete opacification of the left maxillary sinus.  Remaining paranasal sinuses and mastoid air cells are normally aerated.  Post bilateral cataract surgery.  The regional soft tissues are normal.  No displaced calvarial fracture.  IMPRESSION: 1.  Stable findings of atrophy and microvascular ischemic disease without acute intracranial process. 2.  Chronic near-complete opacification of the left maxillary sinus.  Above findings discussed with Dr. Thad Ranger at 12:16.   Original Report Authenticated By: Tacey Ruiz, MD    Mr Angiogram Head Wo Contrast  07/24/2012  *RADIOLOGY REPORT*  Clinical Data:  Code stroke.  A aphasia.  MRI HEAD WITHOUT CONTRAST MRA HEAD WITHOUT CONTRAST  Technique:  Multiplanar, multiecho pulse sequences of the brain and surrounding structures were obtained without intravenous contrast. Angiographic images of the head were obtained using MRA technique without contrast.  Comparison:  CT head without contrast 07/24/2012.  MRI brain without contrast 03/12/2011  MRI HEAD  Findings:  The diffusion weighted images demonstrate no evidence for acute or subacute infarction.  Atrophy and diffuse white matter disease is similar to the prior exam.  A remote left frontal lobe infarct is stable.  Remote lacunar infarcts are noted in the basal ganglia bilaterally as well as the cerebellum, more prominently on the right.  The ventricles are proportionate to the degree of atrophy.  No significant extra-axial fluid collection is  present.  The patient is status post bilateral lens extractions.  Chronic left maxillary sinus opacification is evident.  The remaining paranasal sinuses are clear.  There is fluid in the mastoid air cells bilaterally, right greater than left.  No obstructing nasopharyngeal lesion is evident.  The mastoid effusions are chronic as well.  IMPRESSION:  1.  No acute intracranial abnormality or significant  interval change. 2.  Stable atrophy and diffuse white matter disease. 3.  Remote left frontal lobe infarct. 4.  Remote basal ganglia and cerebellar lacunar infarcts. 5.  Chronic left maxillary sinus disease. 6.  Chronic mastoid effusions bilaterally.  No obstructing nasopharyngeal lesion is evident.  MRA HEAD  Findings: The internal carotid arteries demonstrate tortuosity of the high cervical segments bilaterally without significant stenosis.  The remainder of the internal carotid arteries are normal.  The A1 and M1 segments are normal.  The anterior communicating artery is patent.  Segmental narrowing of the distal MCA branch vessels is similar to the prior exam.  The MCA bifurcations are within normal limits bilaterally.  The left vertebral artery is slightly dominant to the right vertebral artery.  The PICA origins are visualized and normal bilaterally.  The basilar artery is normal.  Both posterior cerebral arteries originate from basilar tip.  There is some attenuation of distal PCA branch vessels bilaterally.  IMPRESSION:  1.  No significant proximal stenosis, aneurysm, or branch vessel occlusion. 2.  Mild distal small vessel disease.   Original Report Authenticated By: Marin Roberts, M.D.    Mr Brain Wo Contrast  07/24/2012  *RADIOLOGY REPORT*  Clinical Data:  Code stroke.  A aphasia.  MRI HEAD WITHOUT CONTRAST MRA HEAD WITHOUT CONTRAST  Technique:  Multiplanar, multiecho pulse sequences of the brain and surrounding structures were obtained without intravenous contrast. Angiographic images of the head  were obtained using MRA technique without contrast.  Comparison:  CT head without contrast 07/24/2012.  MRI brain without contrast 03/12/2011  MRI HEAD  Findings:  The diffusion weighted images demonstrate no evidence for acute or subacute infarction.  Atrophy and diffuse white matter disease is similar to the prior exam.  A remote left frontal lobe infarct is stable.  Remote lacunar infarcts are noted in the basal ganglia bilaterally as well as the cerebellum, more prominently on the right.  The ventricles are proportionate to the degree of atrophy.  No significant extra-axial fluid collection is present.  The patient is status post bilateral lens extractions.  Chronic left maxillary sinus opacification is evident.  The remaining paranasal sinuses are clear.  There is fluid in the mastoid air cells bilaterally, right greater than left.  No obstructing nasopharyngeal lesion is evident.  The mastoid effusions are chronic as well.  IMPRESSION:  1.  No acute intracranial abnormality or significant interval change. 2.  Stable atrophy and diffuse white matter disease. 3.  Remote left frontal lobe infarct. 4.  Remote basal ganglia and cerebellar lacunar infarcts. 5.  Chronic left maxillary sinus disease. 6.  Chronic mastoid effusions bilaterally.  No obstructing nasopharyngeal lesion is evident.  MRA HEAD  Findings: The internal carotid arteries demonstrate tortuosity of the high cervical segments bilaterally without significant stenosis.  The remainder of the internal carotid arteries are normal.  The A1 and M1 segments are normal.  The anterior communicating artery is patent.  Segmental narrowing of the distal MCA branch vessels is similar to the prior exam.  The MCA bifurcations are within normal limits bilaterally.  The left vertebral artery is slightly dominant to the right vertebral artery.  The PICA origins are visualized and normal bilaterally.  The basilar artery is normal.  Both posterior cerebral arteries  originate from basilar tip.  There is some attenuation of distal PCA branch vessels bilaterally.  IMPRESSION:  1.  No significant proximal stenosis, aneurysm, or branch vessel occlusion. 2.  Mild distal small vessel disease.  Original Report Authenticated By: Marin Roberts, M.D.     EEG IMPRESSION: This is a normal EEG.    Disposition and Follow-up: Discharge Orders   Future Appointments Provider Department Dept Phone   08/01/2012 10:30 AM [facility] MEDICAL ONCOLOGY [phone]   08/01/2012 11:00 AM Ladene Artist, MD [facility] MEDICAL ONCOLOGY [phone]   08/01/2012 12:00 PM [facility] MEDICAL ONCOLOGY [phone]   08/01/2012 2:30 PM Anabel Bene, RD [facility] MEDICAL ONCOLOGY [phone]   08/16/2012 11:30 AM Kathleene Hazel, MD [facility] Main Office [place]) [phone]   09/25/2012 3:00 PM Micki Riley, MD [facility] [phone]   Future Orders Complete By Expires     Diet general  As directed     Discharge instructions  As directed     Comments:      Please check BP 2-3 times/day. If you are feeling dizzy and systolic BP is in low 90's-100's, please do not take cardizem that day.    Increase activity slowly  As directed         DISPOSITION: Home DIET: Regular diet ACTIVITY: As tolerated   DISCHARGE FOLLOW-UP Follow-up Information   Follow up with Daisy Floro, MD. Schedule an appointment as soon as possible for a visit in 10 days.   Contact information:   [address] [place] [address] [phone]       Follow up with Liz Malady, MD. Schedule an appointment as soon as possible for a visit in 2 weeks. (for ileostomy care/post op)    Contact information:   [address] [place] [address] [phone]       Follow up with Verne Carrow, MD On 08/16/2012. (at 11:30 am)    Contact  information:   [address] [place] [address] [phone]       Follow up with Thornton Papas, MD. Schedule an appointment as soon as possible for a visit in 1 week. (please call Dr Nolon Rod office to resume xeloda)    Contact information:   [address] [place] [address] [phone]       Time spent on Discharge: 45 mins  Signed:   Martinez Boxx M.D. [facility] 07/26/2012, 10:19 AM Pager: [phone]

## 2012-07-26 NOTE — Progress Notes (Signed)
Patient Name: [name] [name] Date of Encounter: 07/26/2012  Principal Problem:   TIA (transient ischemic attack) Active Problems:   HYPERTENSION, BENIGN SYSTEMIC   Atrial fibrillation   Gastroparesis   Gastric adenocarcinoma   Dehydration   Orthostatic hypotension    SUBJECTIVE: Feels fine today. Concerned about how he should react if this happens again since no cause for the Sx has been found. Compliant with Xarelto at home. He feels good now and wants to increase activity and restart chemo ASAP.  OBJECTIVE Filed Vitals:   07/25/12 2100 07/26/12 0500 07/26/12 0503 07/26/12 0506  BP: 117/76 132/77 119/76 92/60  Pulse: 71 68 76 77  Temp: 98 F (36.7 C) 98 F (36.7 C)    TempSrc:      Resp: 20 18    Height:      Weight:      SpO2: 98% 95%      Intake/Output Summary (Last 24 hours) at 07/26/12 0825 Last data filed at 07/26/12 0500  Gross per 24 hour  Intake      0 ml  Output   1150 ml  Net  -1150 ml   Filed Weights   07/25/12 1100  Weight: 175 lb 8 oz (79.606 kg)    PHYSICAL EXAM General: Well developed, well nourished, elderly male in no acute distress. Head: Normocephalic, atraumatic.  Neck: Supple without bruits, JVD not elevated. Lungs:  Resp regular and unlabored, few rales bases. Heart: RRR, S1, S2, no S3, S4, or murmur; no rub. Abdomen: Soft, non-tender, non-distended, BS + x 4.  Extremities: No clubbing, cyanosis, no edema.  Neuro: Alert and oriented X 3. Moves all extremities spontaneously. Psych: Normal affect.  LABS: CBC: Recent Labs  07/24/12 1205 07/24/12 1218  WBC 8.9  --   NEUTROABS 6.1  --   HGB 10.2* 10.2*  HCT 28.3* 30.0*  MCV 90.7  --   PLT 261  --    INR: Recent Labs  07/24/12 1205  INR 1.47   Basic Metabolic Panel: Recent Labs  07/24/12 1205 07/24/12 1218  NA 134* 137  K 4.3 4.4  CL 105 107  CO2 22  --   GLUCOSE 116* 115*  BUN 13 13  CREATININE 0.95 0.90  CALCIUM 9.5  --    Liver Function Tests: Recent  Labs  07/24/12 1205  AST 22  ALT 41  ALKPHOS 167*  BILITOT 0.5  PROT 5.7*  ALBUMIN 2.6*   Cardiac Enzymes: Recent Labs  07/24/12 1205  TROPONINI <0.30    Recent Labs  07/24/12 1211  TROPIPOC 0.01   TELE:  SR, rare PVCs, PACs    Carotid Dopplers: 07/25/2012 Summary: - Other specific details can be found in the table(s) above. - No significant ICA stenosis (0-39%) noted in the ICA bilaterally. Vertebral artery flow is antegrade.  Radiology/Studies: Ct Head Wo Contrast 07/24/2012  *RADIOLOGY REPORT*  Clinical Data: Code stroke, aphasia  CT HEAD WITHOUT CONTRAST  Technique:  Contiguous axial images were obtained from the base of the skull through the vertex without contrast.  Comparison: 06/04/2012; 04/17/2012  Findings:  Redemonstrated mild diffuse atrophy with sulcal prominence. Scattered periventricular hypodensities, most conspicuous about the anterior horn of the left lateral ventricle (image 17) and within the subcortical right frontal (image 20) and the left parietal (image 21) lobes are grossly unchanged and again compatible with microvascular ischemic disease. Given background parenchymal abnormalities, there is no CT evidence of acute large territory infarct.  No intraparenchymal or extra-axial mass  or hemorrhage. Unchanged size and configuration of the ventricles and basilar cisterns.  No midline shift.  There is chronic near-complete opacification of the left maxillary sinus.  Remaining paranasal sinuses and mastoid air cells are normally aerated.  Post bilateral cataract surgery.  The regional soft tissues are normal.  No displaced calvarial fracture.  IMPRESSION: 1.  Stable findings of atrophy and microvascular ischemic disease without acute intracranial process. 2.  Chronic near-complete opacification of the left maxillary sinus.  Above findings discussed with Dr. Thad Ranger at 12:16.   Original Report Authenticated By: Tacey Ruiz, MD       Mr Brain Wo Contrast 07/24/2012   *RADIOLOGY REPORT*  Clinical Data:  Code stroke.  A aphasia.  MRI HEAD WITHOUT CONTRAST MRA HEAD WITHOUT CONTRAST  Technique:  Multiplanar, multiecho pulse sequences of the brain and surrounding structures were obtained without intravenous contrast. Angiographic images of the head were obtained using MRA technique without contrast.  Comparison:  CT head without contrast 07/24/2012.  MRI brain without contrast 03/12/2011  MRI HEAD  Findings:  The diffusion weighted images demonstrate no evidence for acute or subacute infarction.  Atrophy and diffuse white matter disease is similar to the prior exam.  A remote left frontal lobe infarct is stable.  Remote lacunar infarcts are noted in the basal ganglia bilaterally as well as the cerebellum, more prominently on the right.  The ventricles are proportionate to the degree of atrophy.  No significant extra-axial fluid collection is present.  The patient is status post bilateral lens extractions.  Chronic left maxillary sinus opacification is evident.  The remaining paranasal sinuses are clear.  There is fluid in the mastoid air cells bilaterally, right greater than left.  No obstructing nasopharyngeal lesion is evident.  The mastoid effusions are chronic as well.  IMPRESSION:  1.  No acute intracranial abnormality or significant interval change. 2.  Stable atrophy and diffuse white matter disease. 3.  Remote left frontal lobe infarct. 4.  Remote basal ganglia and cerebellar lacunar infarcts. 5.  Chronic left maxillary sinus disease. 6.  Chronic mastoid effusions bilaterally.  No obstructing nasopharyngeal lesion is evident.    MRA HEAD  Findings: The internal carotid arteries demonstrate tortuosity of the high cervical segments bilaterally without significant stenosis.  The remainder of the internal carotid arteries are normal.  The A1 and M1 segments are normal.  The anterior communicating artery is patent.  Segmental narrowing of the distal MCA branch vessels is similar  to the prior exam.  The MCA bifurcations are within normal limits bilaterally.  The left vertebral artery is slightly dominant to the right vertebral artery.  The PICA origins are visualized and normal bilaterally.  The basilar artery is normal.  Both posterior cerebral arteries originate from basilar tip.  There is some attenuation of distal PCA branch vessels bilaterally.  IMPRESSION:  1.  No significant proximal stenosis, aneurysm, or branch vessel occlusion. 2.  Mild distal small vessel disease.   Original Report Authenticated By: Marin Roberts, M.D.      Current Medications:  . diltiazem  120 mg Oral Daily  . ferrous sulfate  325 mg Oral BID WC  . pantoprazole  40 mg Oral Daily  . psyllium  1 packet Oral BID  . Rivaroxaban  20 mg Oral QPM  . sucralfate  1 g Oral TID AC   . sodium chloride 100 mL/hr at 07/24/12 1750    ASSESSMENT AND PLAN:  [name] [name] is a 79 y.o. male  with PMHx s/f PAF (on chronic Xarelto anticoagulation), h/o gastric CA s/p diverting ileostomy 06/13/12, h/o CVA/multiple TIAs and h/o Bell's palsy who was admitted to [facility] 5/7 for TIA.  The patient has multiple recent admissions for TIAs. Atrial fibrillation has been managed on an outpatient basis. Rate-control includes Cardizem PO, and Toprol-XL was recently added (07/22/12) at his oncologist's office for evidence of conversion to atrial flutter with variable block, rate in the low 100s. He was then started on Toprol-XL 25 mg at the recommendation of Dr. Clifton James for further rate-control. He had also recently received a PICC line for home IVF due to dehydration.     Paroxysmal Atrial fibrillation - maintaining SR now. Toprol XL 25 mg held, still on Cardizem CD 120 mg. BP better now, getting IVF. Consider continuing the Cardizem and using either short-acting additional Cardizem 30 mg or Lopressor 25 mg (1/2-1 tab) as needed if HR elevated or irregular. Follow up appointment made.  Principal  Problem:   TIA (transient ischemic attack) Active Problems:   HYPERTENSION, BENIGN SYSTEMIC   Gastroparesis   Gastric adenocarcinoma   Dehydration   Orthostatic hypotension  Signed, Theodore Demark , PA-C 8:25 AM 07/26/2012  I have personally seen and examined this patient. I agree with the assessment and plan as outlined above. Agree with continuing Xarelto for anticoagulation and Cardizem CD for rate control. No other recs. Pt being discharged home today. I will plan f/u in my office.   MCALHANY,CHRISTOPHER 11:53 AM 07/26/2012

## 2012-07-29 ENCOUNTER — Telehealth: Payer: Self-pay | Admitting: *Deleted

## 2012-07-29 NOTE — Telephone Encounter (Signed)
[facility] called to inquire if refill needed for Xeloda now? Informed them this will be determined at office visit 5/15 and they will be notified as appropriate. Patient was resumed on Xeloda upon hospital discharge according to Dr. Truett Perna.

## 2012-07-31 ENCOUNTER — Telehealth: Payer: Self-pay | Admitting: *Deleted

## 2012-07-31 NOTE — Telephone Encounter (Signed)
Call from Suella Broad, PT with [facility] requesting to extend PT orders since pt was just DC'd from hospital. OK, per Dr. Truett Perna for physical therapy twice weekly for 3 weeks.

## 2012-08-01 ENCOUNTER — Telehealth: Payer: Self-pay | Admitting: Cardiovascular Disease

## 2012-08-01 ENCOUNTER — Ambulatory Visit (HOSPITAL_BASED_OUTPATIENT_CLINIC_OR_DEPARTMENT_OTHER): Payer: Medicare Other

## 2012-08-01 ENCOUNTER — Telehealth: Payer: Self-pay | Admitting: Oncology

## 2012-08-01 ENCOUNTER — Ambulatory Visit: Payer: Medicare Other | Admitting: Nutrition

## 2012-08-01 ENCOUNTER — Other Ambulatory Visit (HOSPITAL_BASED_OUTPATIENT_CLINIC_OR_DEPARTMENT_OTHER): Payer: Medicare Other | Admitting: Lab

## 2012-08-01 ENCOUNTER — Ambulatory Visit (HOSPITAL_BASED_OUTPATIENT_CLINIC_OR_DEPARTMENT_OTHER): Payer: Medicare Other | Admitting: Oncology

## 2012-08-01 VITALS — BP 106/67 | HR 71 | Temp 96.7°F | Resp 18 | Ht 73.0 in | Wt 174.3 lb

## 2012-08-01 DIAGNOSIS — I4891 Unspecified atrial fibrillation: Secondary | ICD-10-CM

## 2012-08-01 DIAGNOSIS — C169 Malignant neoplasm of stomach, unspecified: Secondary | ICD-10-CM

## 2012-08-01 DIAGNOSIS — R197 Diarrhea, unspecified: Secondary | ICD-10-CM

## 2012-08-01 DIAGNOSIS — Z5111 Encounter for antineoplastic chemotherapy: Secondary | ICD-10-CM

## 2012-08-01 DIAGNOSIS — R471 Dysarthria and anarthria: Secondary | ICD-10-CM

## 2012-08-01 DIAGNOSIS — E860 Dehydration: Secondary | ICD-10-CM

## 2012-08-01 LAB — CBC WITH DIFFERENTIAL/PLATELET
BASO%: 0.7 % (ref 0.0–2.0)
Basophils Absolute: 0 10*3/uL (ref 0.0–0.1)
EOS%: 0.2 % (ref 0.0–7.0)
HCT: 31.8 % — ABNORMAL LOW (ref 38.4–49.9)
HGB: 10.5 g/dL — ABNORMAL LOW (ref 13.0–17.1)
LYMPH%: 26.7 % (ref 14.0–49.0)
MCH: 31.7 pg (ref 27.2–33.4)
MCHC: 33 g/dL (ref 32.0–36.0)
MCV: 96.1 fL (ref 79.3–98.0)
NEUT%: 55.7 % (ref 39.0–75.0)
Platelets: 174 10*3/uL (ref 140–400)
lymph#: 1.6 10*3/uL (ref 0.9–3.3)

## 2012-08-01 LAB — COMPREHENSIVE METABOLIC PANEL (CC13)
AST: 19 U/L (ref 5–34)
Albumin: 2.8 g/dL — ABNORMAL LOW (ref 3.5–5.0)
Alkaline Phosphatase: 108 U/L (ref 40–150)
BUN: 17.4 mg/dL (ref 7.0–26.0)
Calcium: 9.6 mg/dL (ref 8.4–10.4)
Chloride: 104 mEq/L (ref 98–107)
Creatinine: 1 mg/dL (ref 0.7–1.3)
Glucose: 107 mg/dl — ABNORMAL HIGH (ref 70–99)
Potassium: 4.4 mEq/L (ref 3.5–5.1)

## 2012-08-01 MED ORDER — PAREGORIC 2 MG/5ML PO TINC: 5.0000 mL | Freq: Four times a day (QID) | ORAL | Status: DC | PRN

## 2012-08-01 MED ORDER — ONDANSETRON 8 MG/50ML IVPB (CHCC)
8.0000 mg | Freq: Once | INTRAVENOUS | Status: AC
  Administered 2012-08-01: 8 mg via INTRAVENOUS

## 2012-08-01 MED ORDER — DEXTROSE 5 % IV SOLN
Freq: Once | INTRAVENOUS | Status: AC
  Administered 2012-08-01: 13:00:00 via INTRAVENOUS

## 2012-08-01 MED ORDER — DEXAMETHASONE SODIUM PHOSPHATE 10 MG/ML IJ SOLN
10.0000 mg | Freq: Once | INTRAMUSCULAR | Status: AC
  Administered 2012-08-01: 10 mg via INTRAVENOUS

## 2012-08-01 MED ORDER — HEPARIN SOD (PORK) LOCK FLUSH 100 UNIT/ML IV SOLN
250.0000 [IU] | Freq: Once | INTRAVENOUS | Status: AC | PRN
  Administered 2012-08-01: 250 [IU]
  Filled 2012-08-01: qty 5

## 2012-08-01 MED ORDER — SODIUM CHLORIDE 0.9 % IJ SOLN
10.0000 mL | INTRAMUSCULAR | Status: DC | PRN
  Administered 2012-08-01: 10 mL
  Filled 2012-08-01: qty 10

## 2012-08-01 MED ORDER — OXALIPLATIN CHEMO INJECTION 100 MG/20ML
85.0000 mg/m2 | Freq: Once | INTRAVENOUS | Status: AC
  Administered 2012-08-01: 170 mg via INTRAVENOUS
  Filled 2012-08-01: qty 34

## 2012-08-01 NOTE — Telephone Encounter (Signed)
New problem    pts wife has question regarding medication

## 2012-08-01 NOTE — Patient Instructions (Signed)
[facility] Discharge Instructions for Patients Receiving Chemotherapy  Today you received the following chemotherapy agents :  Oxaliplatin.   To help prevent nausea and vomiting after your treatment, we encourage you to take your nausea medication as instructed by your physician.    If you develop nausea and vomiting that is not controlled by your nausea medication, call the clinic. If it is after clinic hours your family physician or the after hours number for the clinic or go to the Emergency Department.   BELOW ARE SYMPTOMS THAT SHOULD BE REPORTED IMMEDIATELY:  *FEVER GREATER THAN 100.5 F  *CHILLS WITH OR WITHOUT FEVER  NAUSEA AND VOMITING THAT IS NOT CONTROLLED WITH YOUR NAUSEA MEDICATION  *UNUSUAL SHORTNESS OF BREATH  *UNUSUAL BRUISING OR BLEEDING  TENDERNESS IN MOUTH AND THROAT WITH OR WITHOUT PRESENCE OF ULCERS  *URINARY PROBLEMS  *BOWEL PROBLEMS  UNUSUAL RASH Items with * indicate a potential emergency and should be followed up as soon as possible.  One of the nurses will contact you 24 hours after your treatment. Please let the nurse know about any problems that you may have experienced. Feel free to call the clinic you have any questions or concerns. The clinic phone number is [phone].   I have been informed and understand all the instructions given to me. I know to contact the clinic, my physician, or go to the Emergency Department if any problems should occur. I do not have any questions at this time, but understand that I may call the clinic during office hours   should I have any questions or need assistance in obtaining follow up care.    __________________________________________  _____________  __________ Signature of Patient or Authorized Representative            Date                   Time    __________________________________________ Nurse's Signature    

## 2012-08-01 NOTE — Progress Notes (Signed)
Patient reports he feels much better. He was recently admitted to the hospital. He has had increased fatigue and weakness secondary to this recent bed rest. He has noticed indigestion after intake of chocolate. His weight has increased to 174.3 pounds from 169.3 pounds April 17. He denies other nutrition impact symptoms.  Nutrition diagnosis: Unintended weight loss has improved.  Intervention: Patient was educated to continue high-calorie, high-protein foods with frequent, small meals throughout the day. Patient was educated to monitor dietary intake for improving ileostomy output. I have also educated patient on strategies for foods to avoid to minimize indigestion. Questions were answered. Teach back method used.  Monitoring, evaluation, goals: Patient will tolerate adequate calories and protein to minimize weight loss.  Next visit: Patient will contact me for followup as his treatment schedule is undetermined at this time.

## 2012-08-01 NOTE — Progress Notes (Signed)
   [facility]    OFFICE PROGRESS NOTE   INTERVAL HISTORY:   He was readmitted on 07/24/2012 with transient dysarthria. A neurologic evaluation including a brain MRI showed no acute finding. He was discharged on 07/26/2012. Since discharge from the hospital he reports to additional transient episodes of dysarthria. These lasted approximately 30 minutes and he was awake/aware of the dysarthria. No recurrent syncope events. Good appetite. No abdominal pain. He empties the ileostomy 5-6 times per day. He continues 1 L of IV fluids via a PICC daily. Mr. Esquivel has resumed physical therapy. He is getting out of the house. No neuropathy symptoms or hand/foot pain.  Objective:  Vital signs in last 24 hours:  Blood pressure 106/67, pulse 71, temperature 96.7 F (35.9 C), temperature source Oral, resp. rate 18, height 6\' 1"  (1.854 m), weight 174 lb 4.8 oz (79.062 kg).    HEENT: No thrush or ulcers Resp: Lungs clear bilaterally Cardio: Regular rate and rhythm GI: No hepatomegaly, nontender, no apparent ascites Vascular: Pitting edema at the right greater than left ankle Neuro: Alert and oriented    Portacath/PICC-without erythema  Lab Results:  Lab Results  Component Value Date   WBC 5.8 08/01/2012   HGB 10.5* 08/01/2012   HCT 31.8* 08/01/2012   MCV 96.1 08/01/2012   PLT 174 08/01/2012   ANC 3.2 Potassium 4.4, creatinine 1.0, BUN 17.4    Medications: I have reviewed the patient's current medications.  Assessment/Plan: 1.Metastatic gastric cancer-adenocarcinoma documented on the biopsy of the stomach and transverse colon. Exploratory laparoscopy 06/13/2012 with no tumor seen, positive cytology for metastatic adenocarcinoma. Biopsy of a left scalene nodal 06/13/2012 confirmed metastatic adenocarcinoma. Received first cycle CAPOX last Thursday on 07/11/2012. He started Xeloda but held it after 2 days due to hypotension, diarrhea and dehydration. The Xeloda was resumed  07/22/2012. 2. Partial colonic obstruction secondary to tumor invading the transverse colon-status post a palliative diverting distal ileostomy 06/10/2012  3. History of atrial fibrillation-maintained on xarelto 4. Anorexia/weight loss secondary to metastatic gastric cancer and the colonic obstruction. Now taking Megace with an improved appetite , stable weight 5. Dehydration - due to copious output from ileostomy, poor oral intake and diarrhea. Not improved with Imodium and Metamucil. The diarrhea improved when he began paregoric. He is now taking 1 L of IV fluids daily via a PICC 6. Subxiphoid episodic pain on 07/16/2012 -resolved  7. Admission with transient dysarthria 07/24/2012-no acute change on CNS imaging, he reports recurrent episodes of dysarthria since discharge from the hospital  Disposition:  [name] has a significantly improved performance status. He now has a good appetite and he is participating in physical therapy. He will continue home IV fluids for now. He will resume paregoric as needed. The first cycle of Xeloda has been interrupted on 2 occasions. The plan is to continue Xeloda for an additional 8 days to complete this cycle. He will complete cycle 2 of oxaliplatin today.  Mr. Beever will return for an office visit in 2 weeks. He will be scheduled for cycle 3 of CAPOX to begin in 3 weeks. His wife will contact us with a report on the ostomy output next week. We will consider discontinuing the IV fluids if the ostomy output has slowed.  The etiology of the transient dysarthria is unclear. This could be related to episodic hypotension.   Thornton Papas, MD  08/01/2012  12:13 PM

## 2012-08-01 NOTE — Telephone Encounter (Signed)
Spoke with pt's wife and clarified instructions regarding cardizem based on hospital discharge instructions.

## 2012-08-02 ENCOUNTER — Telehealth: Payer: Self-pay | Admitting: *Deleted

## 2012-08-02 NOTE — Telephone Encounter (Signed)
Notified [facility] pharmacy to continue daily IV fluids in home until further notice from MD.

## 2012-08-02 NOTE — Telephone Encounter (Signed)
Left VM that patient needs another 5 day supply of capecitabine to complete current cycle of treatment= #30 tablets. Will begin next cycle on June 5th--can send script to be signed for this one if needed.

## 2012-08-05 ENCOUNTER — Other Ambulatory Visit: Payer: Self-pay | Admitting: *Deleted

## 2012-08-05 NOTE — Telephone Encounter (Signed)
ON 08/02/12 RECEIVED A FAX FROM [facility] CONCERNING A CONFIRMATION OF PRESCRIPTION SHIPMENT FOR CAPECITABINE ON 08/02/12. CALLED [facility] CONCERNING THE REFILL REQUEST FOR CAPECITABINE RECEIVED ON 08/05/12. IT WAS SENT IN ERROR.

## 2012-08-05 NOTE — Telephone Encounter (Signed)
THIS REFILL REQUEST FOR CAPECITABINE WAS GIVEN TO DR.SHERRILL'S NURSE, SUSAN COWARD,RN. 

## 2012-08-08 ENCOUNTER — Telehealth: Payer: Self-pay | Admitting: *Deleted

## 2012-08-08 NOTE — Telephone Encounter (Signed)
Left message with [place] that ostomy bag was running about 1300cc/day.  He is getting one liter bag of fluids daily.  This is fine with Dr. Truett Perna and they should continue with this plan.  She appreciated the call back.

## 2012-08-09 ENCOUNTER — Encounter: Payer: Self-pay | Admitting: *Deleted

## 2012-08-13 ENCOUNTER — Other Ambulatory Visit: Payer: Self-pay | Admitting: *Deleted

## 2012-08-13 DIAGNOSIS — C169 Malignant neoplasm of stomach, unspecified: Secondary | ICD-10-CM

## 2012-08-13 MED ORDER — CAPECITABINE 500 MG PO TABS: ORAL_TABLET | ORAL | Status: DC

## 2012-08-13 NOTE — Telephone Encounter (Signed)
Refill request to MD desk 

## 2012-08-13 NOTE — Telephone Encounter (Signed)
[facility] faxed confirmation of Xeloda referral.  Will verify insurance and make delivery arrangements.

## 2012-08-16 ENCOUNTER — Ambulatory Visit (HOSPITAL_BASED_OUTPATIENT_CLINIC_OR_DEPARTMENT_OTHER): Payer: Medicare Other | Admitting: Nurse Practitioner

## 2012-08-16 ENCOUNTER — Telehealth: Payer: Self-pay | Admitting: *Deleted

## 2012-08-16 ENCOUNTER — Telehealth: Payer: Self-pay | Admitting: Oncology

## 2012-08-16 ENCOUNTER — Encounter: Payer: Self-pay | Admitting: Cardiovascular Disease

## 2012-08-16 ENCOUNTER — Other Ambulatory Visit (HOSPITAL_BASED_OUTPATIENT_CLINIC_OR_DEPARTMENT_OTHER): Payer: Medicare Other | Admitting: Lab

## 2012-08-16 ENCOUNTER — Other Ambulatory Visit: Payer: Self-pay | Admitting: *Deleted

## 2012-08-16 ENCOUNTER — Ambulatory Visit (INDEPENDENT_AMBULATORY_CARE_PROVIDER_SITE_OTHER): Payer: Medicare Other | Admitting: Cardiovascular Disease

## 2012-08-16 ENCOUNTER — Other Ambulatory Visit: Payer: Self-pay | Admitting: Oncology

## 2012-08-16 VITALS — BP 126/76 | HR 70 | Temp 96.9°F | Resp 20 | Ht 73.0 in | Wt 165.8 lb

## 2012-08-16 VITALS — BP 124/78 | HR 68 | Resp 12 | Ht 73.0 in | Wt 166.0 lb

## 2012-08-16 DIAGNOSIS — C184 Malignant neoplasm of transverse colon: Secondary | ICD-10-CM

## 2012-08-16 DIAGNOSIS — C169 Malignant neoplasm of stomach, unspecified: Secondary | ICD-10-CM

## 2012-08-16 DIAGNOSIS — I4891 Unspecified atrial fibrillation: Secondary | ICD-10-CM

## 2012-08-16 DIAGNOSIS — E860 Dehydration: Secondary | ICD-10-CM

## 2012-08-16 LAB — COMPREHENSIVE METABOLIC PANEL (CC13)
Albumin: 3.2 g/dL — ABNORMAL LOW (ref 3.5–5.0)
BUN: 14.4 mg/dL (ref 7.0–26.0)
CO2: 22 mEq/L (ref 22–29)
Calcium: 9.7 mg/dL (ref 8.4–10.4)
Chloride: 109 mEq/L — ABNORMAL HIGH (ref 98–107)
Creatinine: 1.1 mg/dL (ref 0.7–1.3)
Glucose: 101 mg/dl — ABNORMAL HIGH (ref 70–99)
Potassium: 4.3 mEq/L (ref 3.5–5.1)

## 2012-08-16 LAB — CBC WITH DIFFERENTIAL/PLATELET
Basophils Absolute: 0 10*3/uL (ref 0.0–0.1)
Eosinophils Absolute: 0.2 10*3/uL (ref 0.0–0.5)
HCT: 32 % — ABNORMAL LOW (ref 38.4–49.9)
HGB: 10.9 g/dL — ABNORMAL LOW (ref 13.0–17.1)
NEUT#: 4.2 10*3/uL (ref 1.5–6.5)
NEUT%: 64.4 % (ref 39.0–75.0)
RDW: 20.3 % — ABNORMAL HIGH (ref 11.0–14.6)
lymph#: 1.2 10*3/uL (ref 0.9–3.3)

## 2012-08-16 MED ORDER — CAPECITABINE 500 MG PO TABS: ORAL_TABLET | ORAL | Status: DC

## 2012-08-16 NOTE — Telephone Encounter (Signed)
Gave pt appt for lab, MD and chemo for June 2014

## 2012-08-16 NOTE — Patient Instructions (Addendum)
Your physician wants you to follow-up in: 6 months  You will receive a reminder letter in the mail two months in advance. If you don't receive a letter, please call our office to schedule the follow-up appointment.  Your physician recommends that you continue on your current medications as directed. Please refer to the Current Medication list given to you today.  

## 2012-08-16 NOTE — Telephone Encounter (Signed)
THIS REFILL REQUEST FOR CAPECITABINE WAS GIVEN TO DR.SHERRILL'S NURSE, AMY HORTON,RN. 

## 2012-08-16 NOTE — Progress Notes (Signed)
OFFICE PROGRESS NOTE  Interval history:  [name] [name] returns as scheduled. He overall is feeling better. He completed Xeloda on 08/08/2012. He denies nausea/vomiting. No mouth sores. No hand or foot pain or redness. Ileostomy output is thicker. He is no longer taking paregoric. His wife attributes the change in the ileostomy output to eating bananas. He estimates emptying the collection bag 6 times per day. He continues 1 L of IV fluids per day. No numbness or tingling in his hands or feet. Main complaint is fatigue.   Objective: Blood pressure 126/76, pulse 70, temperature 96.9 F (36.1 C), temperature source Oral, resp. rate 20, height 6\' 1"  (1.854 m), weight 165 lb 12.8 oz (75.206 kg).  No thrush or ulceration. Lungs clear. Regular cardiac rhythm. Abdomen is soft. No hepatomegaly. Trace pitting edema at the right lower leg/ankle. Calves are soft and nontender. PICC site is without erythema.  Lab Results: Lab Results  Component Value Date   WBC 6.6 08/16/2012   HGB 10.9* 08/16/2012   HCT 32.0* 08/16/2012   MCV 101.6* 08/16/2012   PLT 195 08/16/2012    Chemistry:    Chemistry      Component Value Date/Time   NA 136 08/01/2012 1033   NA 137 07/24/2012 1218   K 4.4 08/01/2012 1033   K 4.4 07/24/2012 1218   CL 104 08/01/2012 1033   CL 107 07/24/2012 1218   CO2 25 08/01/2012 1033   CO2 22 07/24/2012 1205   BUN 17.4 08/01/2012 1033   BUN 13 07/24/2012 1218   CREATININE 1.0 08/01/2012 1033   CREATININE 0.90 07/24/2012 1218      Component Value Date/Time   CALCIUM 9.6 08/01/2012 1033   CALCIUM 9.5 07/24/2012 1205   ALKPHOS 108 08/01/2012 1033   ALKPHOS 167* 07/24/2012 1205   AST 19 08/01/2012 1033   AST 22 07/24/2012 1205   ALT 19 08/01/2012 1033   ALT 41 07/24/2012 1205   BILITOT 0.53 08/01/2012 1033   BILITOT 0.5 07/24/2012 1205       Studies/Results: Ct Head Wo Contrast  07/24/2012   *RADIOLOGY REPORT*  Clinical Data: Code stroke, aphasia  CT HEAD WITHOUT CONTRAST  Technique:  Contiguous axial images  were obtained from the base of the skull through the vertex without contrast.  Comparison: 06/04/2012; 04/17/2012  Findings:  Redemonstrated mild diffuse atrophy with sulcal prominence. Scattered periventricular hypodensities, most conspicuous about the anterior horn of the left lateral ventricle (image 17) and within the subcortical right frontal (image 20) and the left parietal (image 21) lobes are grossly unchanged and again compatible with microvascular ischemic disease. Given background parenchymal abnormalities, there is no CT evidence of acute large territory infarct.  No intraparenchymal or extra-axial mass or hemorrhage. Unchanged size and configuration of the ventricles and basilar cisterns.  No midline shift.  There is chronic near-complete opacification of the left maxillary sinus.  Remaining paranasal sinuses and mastoid air cells are normally aerated.  Post bilateral cataract surgery.  The regional soft tissues are normal.  No displaced calvarial fracture.  IMPRESSION: 1.  Stable findings of atrophy and microvascular ischemic disease without acute intracranial process. 2.  Chronic near-complete opacification of the left maxillary sinus.  Above findings discussed with Dr. Thad Ranger at 12:16.   Original Report Authenticated By: Tacey Ruiz, MD   Mr Angiogram Head Wo Contrast  07/24/2012   *RADIOLOGY REPORT*  Clinical Data:  Code stroke.  A aphasia.  MRI HEAD WITHOUT CONTRAST MRA HEAD WITHOUT CONTRAST  Technique:  Multiplanar, multiecho pulse sequences of the brain and surrounding structures were obtained without intravenous contrast. Angiographic images of the head were obtained using MRA technique without contrast.  Comparison:  CT head without contrast 07/24/2012.  MRI brain without contrast 03/12/2011  MRI HEAD  Findings:  The diffusion weighted images demonstrate no evidence for acute or subacute infarction.  Atrophy and diffuse white matter disease is similar to the prior exam.  A remote left  frontal lobe infarct is stable.  Remote lacunar infarcts are noted in the basal ganglia bilaterally as well as the cerebellum, more prominently on the right.  The ventricles are proportionate to the degree of atrophy.  No significant extra-axial fluid collection is present.  The patient is status post bilateral lens extractions.  Chronic left maxillary sinus opacification is evident.  The remaining paranasal sinuses are clear.  There is fluid in the mastoid air cells bilaterally, right greater than left.  No obstructing nasopharyngeal lesion is evident.  The mastoid effusions are chronic as well.  IMPRESSION:  1.  No acute intracranial abnormality or significant interval change. 2.  Stable atrophy and diffuse white matter disease. 3.  Remote left frontal lobe infarct. 4.  Remote basal ganglia and cerebellar lacunar infarcts. 5.  Chronic left maxillary sinus disease. 6.  Chronic mastoid effusions bilaterally.  No obstructing nasopharyngeal lesion is evident.  MRA HEAD  Findings: The internal carotid arteries demonstrate tortuosity of the high cervical segments bilaterally without significant stenosis.  The remainder of the internal carotid arteries are normal.  The A1 and M1 segments are normal.  The anterior communicating artery is patent.  Segmental narrowing of the distal MCA branch vessels is similar to the prior exam.  The MCA bifurcations are within normal limits bilaterally.  The left vertebral artery is slightly dominant to the right vertebral artery.  The PICA origins are visualized and normal bilaterally.  The basilar artery is normal.  Both posterior cerebral arteries originate from basilar tip.  There is some attenuation of distal PCA branch vessels bilaterally.  IMPRESSION:  1.  No significant proximal stenosis, aneurysm, or branch vessel occlusion. 2.  Mild distal small vessel disease.   Original Report Authenticated By: Marin Roberts, M.D.   Mr Brain Wo Contrast  07/24/2012   *RADIOLOGY REPORT*   Clinical Data:  Code stroke.  A aphasia.  MRI HEAD WITHOUT CONTRAST MRA HEAD WITHOUT CONTRAST  Technique:  Multiplanar, multiecho pulse sequences of the brain and surrounding structures were obtained without intravenous contrast. Angiographic images of the head were obtained using MRA technique without contrast.  Comparison:  CT head without contrast 07/24/2012.  MRI brain without contrast 03/12/2011  MRI HEAD  Findings:  The diffusion weighted images demonstrate no evidence for acute or subacute infarction.  Atrophy and diffuse white matter disease is similar to the prior exam.  A remote left frontal lobe infarct is stable.  Remote lacunar infarcts are noted in the basal ganglia bilaterally as well as the cerebellum, more prominently on the right.  The ventricles are proportionate to the degree of atrophy.  No significant extra-axial fluid collection is present.  The patient is status post bilateral lens extractions.  Chronic left maxillary sinus opacification is evident.  The remaining paranasal sinuses are clear.  There is fluid in the mastoid air cells bilaterally, right greater than left.  No obstructing nasopharyngeal lesion is evident.  The mastoid effusions are chronic as well.  IMPRESSION:  1.  No acute intracranial abnormality or significant interval change. 2.  Stable atrophy and diffuse white matter disease. 3.  Remote left frontal lobe infarct. 4.  Remote basal ganglia and cerebellar lacunar infarcts. 5.  Chronic left maxillary sinus disease. 6.  Chronic mastoid effusions bilaterally.  No obstructing nasopharyngeal lesion is evident.  MRA HEAD  Findings: The internal carotid arteries demonstrate tortuosity of the high cervical segments bilaterally without significant stenosis.  The remainder of the internal carotid arteries are normal.  The A1 and M1 segments are normal.  The anterior communicating artery is patent.  Segmental narrowing of the distal MCA branch vessels is similar to the prior exam.   The MCA bifurcations are within normal limits bilaterally.  The left vertebral artery is slightly dominant to the right vertebral artery.  The PICA origins are visualized and normal bilaterally.  The basilar artery is normal.  Both posterior cerebral arteries originate from basilar tip.  There is some attenuation of distal PCA branch vessels bilaterally.  IMPRESSION:  1.  No significant proximal stenosis, aneurysm, or branch vessel occlusion. 2.  Mild distal small vessel disease.   Original Report Authenticated By: Marin Roberts, M.D.   Ir Fluoro Guide Cv Line Right  07/19/2012   *RADIOLOGY REPORT*  Indication: Poor venous access  ULTRASOUND AND FLUORSCOPIC GUIDED PICC LINE INSERTION  Intravenous Medications: None  Contrast: None  Fluoroscopy Time:  12 seconds  Complications: None immediate  Technique / Findings:  The procedure, risks, benefits, and alternatives were explained to the patient and informed written consent was obtained.  A timeout was performed prior to the initiation of the procedure.  The right upper extremity was prepped with chlorhexidine in a sterile fashion, and a sterile drape was applied covering the operative field.  Maximum barrier sterile technique with sterile gowns and gloves were used for the procedure.  A timeout was performed prior to the initiation of the procedure.  Local anesthesia was provided with 1% lidocaine.  Under direct ultrasound guidance, the rightbasilicvein was accessed with a micropuncture kit after the overlying soft tissues were anesthetized with 1% lidocaine.  An ultrasound image was saved for documentation purposes.  A guidewire was advanced to the level of the superior caval-atrial junction for measurement purposes and the PICC line was cut to length.  A peel-away sheath was placed and a 41 cm, 5 [place], dual lumen was inserted to level of the superior caval-atrial junction.  A post procedure spot fluoroscopic was obtained.  The catheter easily aspirated and  flushed and was sutured in place.  A dressing was placed.  The patient tolerated the procedure well without immediate post procedural complication.  Impression:  Successful ultrasound and fluoroscopic guided placement of a right basilic vein approach, 41 cm, 5 French,dual lumen PICC with tip at the superior caval-atrial junction.  The PICC line is ready for immediate use.   Original Report Authenticated By: Tacey Ruiz, MD   Ir US Guide Vasc Access Right  07/19/2012   *RADIOLOGY REPORT*  Indication: Poor venous access  ULTRASOUND AND FLUORSCOPIC GUIDED PICC LINE INSERTION  Intravenous Medications: None  Contrast: None  Fluoroscopy Time:  12 seconds  Complications: None immediate  Technique / Findings:  The procedure, risks, benefits, and alternatives were explained to the patient and informed written consent was obtained.  A timeout was performed prior to the initiation of the procedure.  The right upper extremity was prepped with chlorhexidine in a sterile fashion, and a sterile drape was applied covering the operative field.  Maximum barrier sterile technique with sterile gowns  and gloves were used for the procedure.  A timeout was performed prior to the initiation of the procedure.  Local anesthesia was provided with 1% lidocaine.  Under direct ultrasound guidance, the rightbasilicvein was accessed with a micropuncture kit after the overlying soft tissues were anesthetized with 1% lidocaine.  An ultrasound image was saved for documentation purposes.  A guidewire was advanced to the level of the superior caval-atrial junction for measurement purposes and the PICC line was cut to length.  A peel-away sheath was placed and a 41 cm, 5 [place], dual lumen was inserted to level of the superior caval-atrial junction.  A post procedure spot fluoroscopic was obtained.  The catheter easily aspirated and flushed and was sutured in place.  A dressing was placed.  The patient tolerated the procedure well without immediate  post procedural complication.  Impression:  Successful ultrasound and fluoroscopic guided placement of a right basilic vein approach, 41 cm, 5 French,dual lumen PICC with tip at the superior caval-atrial junction.  The PICC line is ready for immediate use.   Original Report Authenticated By: Tacey Ruiz, MD    Medications: I have reviewed the patient's current medications.  Assessment/Plan:  1.Metastatic gastric cancer-adenocarcinoma documented on the biopsy of the stomach and transverse colon. Exploratory laparoscopy 06/13/2012 with no tumor seen, positive cytology for metastatic adenocarcinoma. Biopsy of a left scalene nodal 06/13/2012 confirmed metastatic adenocarcinoma. Received first cycle CAPOX last Thursday on 07/11/2012. He started Xeloda but held it after 2 days due to hypotension, diarrhea and dehydration. The Xeloda was resumed 07/22/2012. He received cycle 2 CAPOX beginning 08/01/2012. 2. Partial colonic obstruction secondary to tumor invading the transverse colon-status post a palliative diverting distal ileostomy 06/10/2012.  3. History of atrial fibrillation-maintained on xarelto.  4. Anorexia/weight loss secondary to metastatic gastric cancer and the colonic obstruction. Now taking Megace with an improved appetite. 5. Dehydration - due to copious output from ileostomy, poor oral intake and diarrhea. Not improved with Imodium and Metamucil. The diarrhea improved when he began paregoric. He is now taking 1 L of IV fluids daily via a PICC. He is no longer taking paregoric. He notes the ileostomy output is "thicker".  6. Subxiphoid episodic pain on 07/16/2012 -resolved.  7. Admission with transient dysarthria 07/24/2012-no acute change on CNS imaging, he reports recurrent episodes of dysarthria since discharge from the hospital.  Disposition-Mr. Randalls performance status continues to be improved. He has completed 2 cycles of CAPOX. He will return to begin cycle 3 on 08/22/2012. Dr.  Truett Perna will see him in followup on 09/12/2012 prior to cycle 4.  The ileostomy output is less. He will continue daily IV fluids for now.  Plan reviewed with Dr. Truett Perna.   Lonna Cobb ANP/GNP-BC

## 2012-08-16 NOTE — Progress Notes (Signed)
History of Present Illness: 79 yo WM with history of HTN, BPH, former tobacco abuse (stopped 1985), OA, CVA/TIA, atrial flutter who is here today for cardiac follow up. I saw him in April 2012 to establish cardiology care.He told me that he had a CT scan of the chest in November 2011 for f/u of pulmonary nodules. His coronary arteries showed calcification. He described exertional chest pain at our first visit. I ordered an echo and an exercise stress myoview. His myoview showed normal perfusion with small apical defect. He exercised for 12 minutes with no chest pain and no EKG changes. Echo with normal LV function, biatrial enlargement, no significant valvular issues. I saw him in May 2012 and recommended conservative approach as he was feeling well and having no chest pain.He was seen in the ED in September 2012 for chest pain and had negative tests. He had a CVA on 03/12/11 and was admitted to [facility]. Echo with normal LV function. Carotid artery dopplers were normal. He described right sided arm and leg weakness. He completely recovered neurologically. He was started on Plavix in the hospital after his stroke in 2012. He was hospitalized in [place], [place] 1/26-1/28/14 for TIA vs CVA. During hospitalization (per records in paper chart) echo again showed nl LV systolic fxn 55-65%, no WMAs, or cardiac source of emboli. ASA was added to his regimen. He was discharged home but was admitted to [facility] on 04/17/12 with speech deficits. In the ED EKG showed Atrial Flutter w/ variable conduction 95bpm, nonspecific ST/T changes. He spontaneously converted to NSR. His speech returned to normal after about one hour. He was seen by Dr. Antoine Poche at [facility] on 04/18/12. His ASA and Plavix was stopped and he was started on Pradaxa for anticoagulation. He was admitted to [facility] March 2014 with GI bleeding and found to have stage 4 adenocarcinoma of the stomach. Diverting loop ileostomy per Dr. Janee Morn June 13, 2012. He was started on  Xarelto before discharge home. He has been started on chemotherapy by Dr. Truett Perna. Re-admitted to [facility] May 2014 with a TIA on Xarelto.    He is here today for follow up. He is feeling well. No chest pain, SOB, awareness of palpitations. No dizziness, near syncope or syncope. No bleeding problems on Xarelto. He was seen in Oncology clinic today.   Primary Care Physician: Dr. Gildardo Cranker. [facility])  Last Lipid Profile:Lipid Panel     Component Value Date/Time   CHOL 100 04/18/2012 0610   TRIG 61 04/18/2012 0610   HDL 60 04/18/2012 0610   CHOLHDL 1.7 04/18/2012 0610   VLDL 12 04/18/2012 0610   LDLCALC 28 04/18/2012 0610     Past Medical History  Diagnosis Date  . Ramsay Hunt auricular syndrome   . HTN (hypertension)   . Osteoarthritis   . Dysrhythmia 04/17/12  . Anxiety   . Stroke   . H/O Bell's palsy at age 64  . Atrial flutter   . Atrial fibrillation   . Ileostomy in place 06/14/2012    Past Surgical History  Procedure Laterality Date  . Hernia repair    . Vasectomy    . Prostate biopsies      per Vonita Moss  . Cataract extraction      SE eye  . Colonoscopy N/A 06/03/2012    Procedure: COLONOSCOPY;  Surgeon: Florencia Reasons, MD;  Location: [facility] ENDOSCOPY;  Service: Endoscopy;  Laterality: N/A;  . Esophagogastroduodenoscopy N/A 06/03/2012    Procedure: ESOPHAGOGASTRODUODENOSCOPY (EGD);  Surgeon: Molly Maduro  Stephanie Coup, MD;  Location: [facility] ENDOSCOPY;  Service: Endoscopy;  Laterality: N/A;  . Laparoscopy N/A 06/13/2012    Procedure: EXPLORATORY LAPAROSCOPY ;  Surgeon: Liz Malady, MD;  Location[facility] OR;  Service: General;  Laterality: N/A;  . Diverting ileostomy N/A 06/13/2012    Procedure: DIVERTING LOOP ILEOSTOMY;  Surgeon: Liz Malady, MD;  Location: [facility] OR;  Service: General;  Laterality: N/A;  . Lymph node biopsy Left 06/13/2012    Procedure: LYMPH NODE BIOPSY;  Surgeon: Liz Malady, MD;  Location: [facility] OR;  Service: General;  Laterality: Left;  Scalene Lymph Node Biopsy     Current Outpatient Prescriptions  Medication Sig Dispense Refill  . [START ON 08/22/2012] capecitabine (XELODA) 500 MG tablet Take #3 (1500 mg) po twice daily after meals for 14 days, then 7 day rest  84 tablet  0  . Dextrose-Sodium Chloride (DEXTROSE 5 % AND 0.9% NACL) 5-0.9 % infusion       . diltiazem (CARDIZEM CD) 120 MG 24 hr capsule Take 1 capsule (120 mg total) by mouth every morning. For atrial fibrillation  30 capsule  1  . ferrous sulfate 325 (65 FE) MG tablet Take 1 tablet (325 mg total) by mouth 2 (two) times daily with a meal.  60 tablet  1  . HYDROcodone-acetaminophen (NORCO/VICODIN) 5-325 MG per tablet Take 1 tablet by mouth as needed.      . megestrol (MEGACE ORAL) 40 MG/ML suspension Take 5 mLs (200 mg total) by mouth 2 (two) times daily.  300 mL  2  . Multiple Vitamins-Minerals (PRESERVISION AREDS PO) Take 1 tablet by mouth 2 (two) times daily.      Marland Kitchen omeprazole (PRILOSEC) 20 MG capsule Take 20 mg by mouth 2 (two) times daily.       . ondansetron (ZOFRAN) 4 MG tablet Take 4 mg by mouth every 8 (eight) hours as needed for nausea.      . paregoric 2 MG/5ML solution Take 5 mLs by mouth 4 (four) times daily as needed for diarrhea or loose stools (may also continue to take Imodium in addition as needed).  120 mL  0  . prochlorperazine (COMPAZINE) 10 MG tablet Take 10 mg by mouth every 6 (six) hours as needed (nausea).      . psyllium (HYDROCIL/METAMUCIL) 95 % PACK Take 1 packet by mouth 2 (two) times daily.      . Rivaroxaban (XARELTO) 20 MG TABS Take 20 mg by mouth every evening.      . sucralfate (CARAFATE) 1 G tablet Take 1 g by mouth 3 (three) times daily before meals.        No current facility-administered medications for this visit.    Allergies  Allergen Reactions  . Penicillins Rash    Rash appeared on palms of hands and feet.    History   Social History  . Marital Status: Married    Spouse Name: Harriett Sine    Number of Children: 2  . Years of Education: N/A    Occupational History  . 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   Social History Main Topics  . Smoking status: Former Smoker    Quit date: 06/22/1984  . Smokeless tobacco: Never Used  . Alcohol Use: 0.0 oz/week     Comment: 1 glass of wine a day  . Drug Use: No  . Sexually Active: Not on file   Other Topics Concern  . Not on file   Social History Narrative  Health Care POA: wife, Thereasa Parkin   Emergency Contact: Thereasa Parkin, (c) [phone]   End of Life Plan: DNR   Who lives with you: Lives with wife, Harriett Sine   Any pets: coon hound, Cookie   Diet: Patient has a varied diet of protein, starch, vegetables.   Exercise: Patient walks or does yoga 5 x a week.   Seatbelts: Patient reports wearing a seat belt when in vehicle.   Sun Exposure/Protection: Patient reports infrequent use of sun screen.   Hobbies: Walking, running, politics          Family History  Problem Relation Age of Onset  . Cancer Father   . Diabetes Father     Review of Systems:  As stated in the HPI and otherwise negative.   BP 124/78  Pulse 68  Resp 12  Ht 6\' 1"  (1.854 m)  Wt 166 lb (75.297 kg)  BMI 21.91 kg/m2  Physical Examination: General: Well developed, well nourished, NAD HEENT: OP clear, mucus membranes moist SKIN: warm, dry. No rashes. Neuro: No focal deficits Musculoskeletal: Muscle strength 5/5 all ext Psychiatric: Mood and affect normal Neck: No JVD, no carotid bruits, no thyromegaly, no lymphadenopathy. Lungs:Clear bilaterally, no wheezes, rhonci, crackles Cardiovascular: Regular rate and rhythm. No murmurs, gallops or rubs. Abdomen:Soft. Bowel sounds present. Non-tender.  Extremities: No lower extremity edema. Pulses are 2 + in the bilateral DP/PT.  EKG: Sinus, rate 68 bpm. PAC.   Assessment and Plan:   1. Atrial fibrillation/flutter: Sinus today. Continue cardizem. Will continue Xarelto for now.   2. Stage 4 gastric adenocarcinoma: Currently on  chemotherapy. Followed by Dr. Truett Perna.   3. TIA/CVA: Several recent episodes of speech issues but Neurology has made no changes. Continue Xarelto.

## 2012-08-16 NOTE — Telephone Encounter (Signed)
Per staff message and POF I have scheduled appts.  JMW  

## 2012-08-16 NOTE — Telephone Encounter (Signed)
Gave pt apt for lab, chemo and MD for June 2014

## 2012-08-20 ENCOUNTER — Telehealth: Payer: Self-pay | Admitting: *Deleted

## 2012-08-20 NOTE — Telephone Encounter (Signed)
Asking if he should begin Xeloda on 6/5 when due or wait till 6/6 when he gets IV portion of treatment. Per Dr. Truett Perna, wait until 08/23/12. Patient understands and agrees.

## 2012-08-21 ENCOUNTER — Telehealth: Payer: Self-pay | Admitting: *Deleted

## 2012-08-21 NOTE — Telephone Encounter (Signed)
Call from [facility] with [facility] pharmacy, will pt need to continue IVF? Reviewed with Dr. Truett Perna: Order received to continue IV fluids until next office visit. Called [facility] pharmacy with orders.

## 2012-08-22 ENCOUNTER — Other Ambulatory Visit: Payer: Medicare Other | Admitting: Lab

## 2012-08-22 NOTE — Telephone Encounter (Signed)
RECEIVED A FAX FROM [facility] CONCERNING A CONFIRMATION OF PRESCRIPTION SHIPMENT FOR CAPECITABINE ON 08/21/12.

## 2012-08-23 ENCOUNTER — Other Ambulatory Visit (HOSPITAL_BASED_OUTPATIENT_CLINIC_OR_DEPARTMENT_OTHER): Payer: Medicare Other

## 2012-08-23 ENCOUNTER — Ambulatory Visit (HOSPITAL_BASED_OUTPATIENT_CLINIC_OR_DEPARTMENT_OTHER): Payer: Medicare Other

## 2012-08-23 VITALS — BP 108/69 | HR 71 | Temp 97.4°F | Resp 18

## 2012-08-23 DIAGNOSIS — C169 Malignant neoplasm of stomach, unspecified: Secondary | ICD-10-CM

## 2012-08-23 DIAGNOSIS — Z5111 Encounter for antineoplastic chemotherapy: Secondary | ICD-10-CM

## 2012-08-23 LAB — CBC WITH DIFFERENTIAL/PLATELET
Basophils Absolute: 0 10*3/uL (ref 0.0–0.1)
EOS%: 3 % (ref 0.0–7.0)
Eosinophils Absolute: 0.2 10*3/uL (ref 0.0–0.5)
HCT: 33.5 % — ABNORMAL LOW (ref 38.4–49.9)
HGB: 11.3 g/dL — ABNORMAL LOW (ref 13.0–17.1)
MCH: 34.1 pg — ABNORMAL HIGH (ref 27.2–33.4)
MONO#: 1.1 10*3/uL — ABNORMAL HIGH (ref 0.1–0.9)
NEUT#: 3.2 10*3/uL (ref 1.5–6.5)
RDW: 21 % — ABNORMAL HIGH (ref 11.0–14.6)
WBC: 5.7 10*3/uL (ref 4.0–10.3)
lymph#: 1.2 10*3/uL (ref 0.9–3.3)

## 2012-08-23 LAB — COMPREHENSIVE METABOLIC PANEL (CC13)
ALT: 64 U/L — ABNORMAL HIGH (ref 0–55)
AST: 50 U/L — ABNORMAL HIGH (ref 5–34)
Chloride: 108 mEq/L — ABNORMAL HIGH (ref 98–107)
Creatinine: 1 mg/dL (ref 0.7–1.3)
Sodium: 137 mEq/L (ref 136–145)
Total Bilirubin: 0.84 mg/dL (ref 0.20–1.20)
Total Protein: 5.9 g/dL — ABNORMAL LOW (ref 6.4–8.3)

## 2012-08-23 MED ORDER — DEXTROSE 5 % IV SOLN
Freq: Once | INTRAVENOUS | Status: AC
  Administered 2012-08-23: 09:00:00 via INTRAVENOUS

## 2012-08-23 MED ORDER — DEXAMETHASONE SODIUM PHOSPHATE 10 MG/ML IJ SOLN
10.0000 mg | Freq: Once | INTRAMUSCULAR | Status: AC
  Administered 2012-08-23: 10 mg via INTRAVENOUS

## 2012-08-23 MED ORDER — HEPARIN SOD (PORK) LOCK FLUSH 100 UNIT/ML IV SOLN
250.0000 [IU] | Freq: Once | INTRAVENOUS | Status: AC | PRN
  Administered 2012-08-23: 250 [IU]
  Filled 2012-08-23: qty 5

## 2012-08-23 MED ORDER — OXALIPLATIN CHEMO INJECTION 100 MG/20ML
85.0000 mg/m2 | Freq: Once | INTRAVENOUS | Status: AC
  Administered 2012-08-23: 170 mg via INTRAVENOUS
  Filled 2012-08-23: qty 34

## 2012-08-23 MED ORDER — SODIUM CHLORIDE 0.9 % IJ SOLN
10.0000 mL | INTRAMUSCULAR | Status: DC | PRN
  Administered 2012-08-23: 10 mL
  Filled 2012-08-23: qty 10

## 2012-08-23 MED ORDER — ONDANSETRON 8 MG/50ML IVPB (CHCC)
8.0000 mg | Freq: Once | INTRAVENOUS | Status: AC
  Administered 2012-08-23: 8 mg via INTRAVENOUS

## 2012-08-23 NOTE — Patient Instructions (Signed)
[facility] Discharge Instructions for Patients Receiving Chemotherapy  Today you received the following chemotherapy agents Oxalilplatin   To help prevent nausea and vomiting after your treatment, we encourage you to take your nausea medication as prescribed. If you develop nausea and vomiting that is not controlled by your nausea medication, call the clinic.   BELOW ARE SYMPTOMS THAT SHOULD BE REPORTED IMMEDIATELY:  *FEVER GREATER THAN 100.5 F  *CHILLS WITH OR WITHOUT FEVER  NAUSEA AND VOMITING THAT IS NOT CONTROLLED WITH YOUR NAUSEA MEDICATION  *UNUSUAL SHORTNESS OF BREATH  *UNUSUAL BRUISING OR BLEEDING  TENDERNESS IN MOUTH AND THROAT WITH OR WITHOUT PRESENCE OF ULCERS  *URINARY PROBLEMS  *BOWEL PROBLEMS  UNUSUAL RASH Items with * indicate a potential emergency and should be followed up as soon as possible.  Feel free to call the clinic you have any questions or concerns. The clinic phone number is [phone].

## 2012-09-06 ENCOUNTER — Other Ambulatory Visit: Payer: Self-pay | Admitting: *Deleted

## 2012-09-06 DIAGNOSIS — C169 Malignant neoplasm of stomach, unspecified: Secondary | ICD-10-CM

## 2012-09-06 NOTE — Telephone Encounter (Signed)
THIS REFILL REQUEST FOR CAPECITABINE WAS GIVEN TO DR.SHERRILL'S NURSE, SUSAN COWARD,RN. 

## 2012-09-09 ENCOUNTER — Other Ambulatory Visit: Payer: Self-pay | Admitting: *Deleted

## 2012-09-09 ENCOUNTER — Telehealth: Payer: Self-pay | Admitting: *Deleted

## 2012-09-09 DIAGNOSIS — C169 Malignant neoplasm of stomach, unspecified: Secondary | ICD-10-CM

## 2012-09-09 MED ORDER — CAPECITABINE 500 MG PO TABS: ORAL_TABLET | ORAL | Status: DC

## 2012-09-09 NOTE — Telephone Encounter (Signed)
[employer] faxed Xeloda refill request.  Request to provider for review.

## 2012-09-11 ENCOUNTER — Other Ambulatory Visit: Payer: Self-pay | Admitting: Oncology

## 2012-09-12 ENCOUNTER — Ambulatory Visit (HOSPITAL_BASED_OUTPATIENT_CLINIC_OR_DEPARTMENT_OTHER): Payer: Medicare Other

## 2012-09-12 ENCOUNTER — Telehealth: Payer: Self-pay | Admitting: Oncology

## 2012-09-12 ENCOUNTER — Telehealth: Payer: Self-pay | Admitting: *Deleted

## 2012-09-12 ENCOUNTER — Ambulatory Visit (HOSPITAL_BASED_OUTPATIENT_CLINIC_OR_DEPARTMENT_OTHER): Payer: Medicare Other | Admitting: Oncology

## 2012-09-12 ENCOUNTER — Other Ambulatory Visit (HOSPITAL_BASED_OUTPATIENT_CLINIC_OR_DEPARTMENT_OTHER): Payer: Medicare Other | Admitting: Lab

## 2012-09-12 VITALS — BP 131/82 | HR 73 | Temp 96.5°F | Resp 18 | Ht 73.0 in | Wt 179.1 lb

## 2012-09-12 DIAGNOSIS — C169 Malignant neoplasm of stomach, unspecified: Secondary | ICD-10-CM

## 2012-09-12 DIAGNOSIS — Z5111 Encounter for antineoplastic chemotherapy: Secondary | ICD-10-CM

## 2012-09-12 DIAGNOSIS — C184 Malignant neoplasm of transverse colon: Secondary | ICD-10-CM

## 2012-09-12 DIAGNOSIS — E860 Dehydration: Secondary | ICD-10-CM

## 2012-09-12 LAB — CBC WITH DIFFERENTIAL/PLATELET
BASO%: 0.5 % (ref 0.0–2.0)
EOS%: 1.7 % (ref 0.0–7.0)
HCT: 31.4 % — ABNORMAL LOW (ref 38.4–49.9)
LYMPH%: 25.3 % (ref 14.0–49.0)
MCH: 34.8 pg — ABNORMAL HIGH (ref 27.2–33.4)
MCHC: 33.4 g/dL (ref 32.0–36.0)
MCV: 104 fL — ABNORMAL HIGH (ref 79.3–98.0)
MONO%: 17.3 % — ABNORMAL HIGH (ref 0.0–14.0)
NEUT%: 55.2 % (ref 39.0–75.0)
Platelets: 186 10*3/uL (ref 140–400)

## 2012-09-12 LAB — COMPREHENSIVE METABOLIC PANEL (CC13)
ALT: 19 U/L (ref 0–55)
Albumin: 3.1 g/dL — ABNORMAL LOW (ref 3.5–5.0)
BUN: 12.2 mg/dL (ref 7.0–26.0)
CO2: 24 mEq/L (ref 22–29)
Calcium: 9.4 mg/dL (ref 8.4–10.4)
Chloride: 107 mEq/L (ref 98–109)
Creatinine: 1 mg/dL (ref 0.7–1.3)
Potassium: 4.2 mEq/L (ref 3.5–5.1)

## 2012-09-12 MED ORDER — OXALIPLATIN CHEMO INJECTION 100 MG/20ML
85.0000 mg/m2 | Freq: Once | INTRAVENOUS | Status: AC
  Administered 2012-09-12: 170 mg via INTRAVENOUS
  Filled 2012-09-12: qty 34

## 2012-09-12 MED ORDER — DEXAMETHASONE SODIUM PHOSPHATE 10 MG/ML IJ SOLN
10.0000 mg | Freq: Once | INTRAMUSCULAR | Status: AC
  Administered 2012-09-12: 10 mg via INTRAVENOUS

## 2012-09-12 MED ORDER — ONDANSETRON 8 MG/50ML IVPB (CHCC)
8.0000 mg | Freq: Once | INTRAVENOUS | Status: AC
  Administered 2012-09-12: 8 mg via INTRAVENOUS

## 2012-09-12 NOTE — Progress Notes (Signed)
   [facility]    OFFICE PROGRESS NOTE   INTERVAL HISTORY:   He returns as scheduled. He continues daily IV fluids. The ileostomy output is now semi-formed. He is exercising. No pain. He feels well. He completed another cycle of CAPOX beginning on 08/23/2012. He tolerated chemotherapy well. No nausea/vomiting or mouth sores. No numbness in the fingers. He had an episode of transient left arm weakness last week. This spontaneously resolved after 30 minutes. He has noted swelling in the ankles.  Objective:  Vital signs in last 24 hours:  Blood pressure 131/82, pulse 73, temperature 96.5 F (35.8 C), temperature source Oral, resp. rate 18, height 6\' 1"  (1.854 m), weight 179 lb 1.6 oz (81.239 kg).    HEENT: No thrush or ulcers Resp: Lungs clear bilaterally Cardio: Regular rate and rhythm GI: No hepatomegaly, nontender, no mass Vascular: Trace pitting edema at the ankles bilaterally Neuro: Mild to moderate decrease in vibratory sense at the fingertips bilaterally, left facial droop, good hand grip bilaterally    Portacath/PICC-without erythema  Lab Results:  Lab Results  Component Value Date   WBC 5.8 09/12/2012   HGB 10.5* 09/12/2012   HCT 31.4* 09/12/2012   MCV 104.0* 09/12/2012   PLT 186 09/12/2012   ANC 3.2    Medications: I have reviewed the patient's current medications.  Assessment/Plan: 1.Metastatic gastric cancer-adenocarcinoma documented on the biopsy of the stomach and transverse colon. Exploratory laparoscopy 06/13/2012 with no tumor seen, positive cytology for metastatic adenocarcinoma. Biopsy of a left scalene nodal 06/13/2012 confirmed metastatic adenocarcinoma. Received first cycle CAPOX last Thursday on 07/11/2012. He started Xeloda but held it after 2 days due to hypotension, diarrhea and dehydration. The Xeloda was resumed 07/22/2012. He received cycle 2 CAPOX beginning 08/01/2012, cycle 3 on 08/23/2012 2. Partial colonic obstruction secondary to  tumor invading the transverse colon-status post a palliative diverting distal ileostomy 06/10/2012.  3. History of atrial fibrillation-maintained on xarelto.  4. Anorexia/weight loss secondary to metastatic gastric cancer and the colonic obstruction. His appetite has improved and he has gained weight  5. Dehydration - due to copious output from ileostomy, poor oral intake and diarrhea. Not improved with Imodium and Metamucil. The diarrhea improved when he began paregoric. He is now taking 1 L of IV fluids daily via a PICC. He is no longer taking paregoric. He notes the ileostomy output is "thicker" after beginning eating bananas.  6. Subxiphoid episodic pain on 07/16/2012 -resolved.  7. Admission with transient dysarthria 07/24/2012-no acute change on CNS imaging, he reports recurrent episodes of dysarthria since discharge from the hospital. Episode of transient left arm weakness during the week of 09/02/2012-spontaneously resolved. He has a followup appointment with Dr. Pearlean Brownie.   Disposition:  [name] has completed 3 cycles of CAPOX. His performance status is much improved. He is now exercising daily and has a good appetite. The plan is to proceed with cycle 4 of CAPOX today. He will return for an office visit and chemotherapy in 3 weeks. He will discontinue the home IV fluids.  The plan is to complete 6 cycles of CAPOX prior to a restaging CT. He has minimal "measurable "disease on CT and it may be difficult to follow his response to therapy. The CEA was not elevated. However his performance status appears much improved compared to prechemotherapy.   Thornton Papas, MD  09/12/2012  1:45 PM

## 2012-09-12 NOTE — Progress Notes (Signed)
Reports he should have delivery of his Xeloda this afternoon. Informed him OK to start taking this afternoon.

## 2012-09-12 NOTE — Progress Notes (Signed)
Called pharmacy w/[facility] [phone]) with orders to discontinue daily IVF. Will keep PICC line, but stop fluids.

## 2012-09-12 NOTE — Patient Instructions (Addendum)
[facility] Discharge Instructions for Patients Receiving Chemotherapy  Today you received the following chemotherapy agents Oxaliplatin.  To help prevent nausea and vomiting after your treatment, we encourage you to take your nausea medication.   If you develop nausea and vomiting that is not controlled by your nausea medication, call the clinic.   BELOW ARE SYMPTOMS THAT SHOULD BE REPORTED IMMEDIATELY:  *FEVER GREATER THAN 100.5 F  *CHILLS WITH OR WITHOUT FEVER  NAUSEA AND VOMITING THAT IS NOT CONTROLLED WITH YOUR NAUSEA MEDICATION  *UNUSUAL SHORTNESS OF BREATH  *UNUSUAL BRUISING OR BLEEDING  TENDERNESS IN MOUTH AND THROAT WITH OR WITHOUT PRESENCE OF ULCERS  *URINARY PROBLEMS  *BOWEL PROBLEMS  UNUSUAL RASH Items with * indicate a potential emergency and should be followed up as soon as possible.  Feel free to call the clinic you have any questions or concerns. The clinic phone number is [phone].    

## 2012-09-12 NOTE — Telephone Encounter (Signed)
Per staff message and POF I have scheduled appts.  JMW  

## 2012-09-16 ENCOUNTER — Telehealth: Payer: Self-pay | Admitting: *Deleted

## 2012-09-16 ENCOUNTER — Emergency Department (HOSPITAL_COMMUNITY)
Admission: EM | Admit: 2012-09-16 | Discharge: 2012-09-16 | Disposition: A | Discharge status: Home / Self Care | Payer: Medicare Other | Attending: Emergency Medicine | Admitting: Emergency Medicine

## 2012-09-16 ENCOUNTER — Other Ambulatory Visit: Payer: Self-pay

## 2012-09-16 ENCOUNTER — Emergency Department (HOSPITAL_COMMUNITY): Payer: Medicare Other

## 2012-09-16 ENCOUNTER — Other Ambulatory Visit: Payer: Self-pay | Admitting: *Deleted

## 2012-09-16 ENCOUNTER — Encounter (HOSPITAL_COMMUNITY): Payer: Self-pay | Admitting: Emergency Medicine

## 2012-09-16 DIAGNOSIS — Z87891 Personal history of nicotine dependence: Secondary | ICD-10-CM | POA: Insufficient documentation

## 2012-09-16 DIAGNOSIS — I10 Essential (primary) hypertension: Secondary | ICD-10-CM | POA: Insufficient documentation

## 2012-09-16 DIAGNOSIS — I4891 Unspecified atrial fibrillation: Secondary | ICD-10-CM | POA: Insufficient documentation

## 2012-09-16 DIAGNOSIS — Z85028 Personal history of other malignant neoplasm of stomach: Secondary | ICD-10-CM | POA: Insufficient documentation

## 2012-09-16 DIAGNOSIS — R4789 Other speech disturbances: Secondary | ICD-10-CM | POA: Insufficient documentation

## 2012-09-16 DIAGNOSIS — Z8679 Personal history of other diseases of the circulatory system: Secondary | ICD-10-CM | POA: Insufficient documentation

## 2012-09-16 DIAGNOSIS — Z880 Allergy status to penicillin: Secondary | ICD-10-CM | POA: Insufficient documentation

## 2012-09-16 DIAGNOSIS — Z79899 Other long term (current) drug therapy: Secondary | ICD-10-CM | POA: Insufficient documentation

## 2012-09-16 DIAGNOSIS — Z8669 Personal history of other diseases of the nervous system and sense organs: Secondary | ICD-10-CM | POA: Insufficient documentation

## 2012-09-16 DIAGNOSIS — Z8739 Personal history of other diseases of the musculoskeletal system and connective tissue: Secondary | ICD-10-CM | POA: Insufficient documentation

## 2012-09-16 DIAGNOSIS — G459 Transient cerebral ischemic attack, unspecified: Secondary | ICD-10-CM | POA: Insufficient documentation

## 2012-09-16 DIAGNOSIS — Z8659 Personal history of other mental and behavioral disorders: Secondary | ICD-10-CM | POA: Insufficient documentation

## 2012-09-16 LAB — COMPREHENSIVE METABOLIC PANEL
ALT: 23 U/L (ref 0–53)
Alkaline Phosphatase: 102 U/L (ref 39–117)
BUN: 16 mg/dL (ref 6–23)
CO2: 24 mEq/L (ref 19–32)
Chloride: 105 mEq/L (ref 96–112)
GFR calc Af Amer: 69 mL/min — ABNORMAL LOW (ref >90)
Glucose, Bld: 102 mg/dL — ABNORMAL HIGH (ref 70–99)
Potassium: 3.8 mEq/L (ref 3.5–5.1)
Sodium: 138 mEq/L (ref 135–145)
Total Bilirubin: 0.8 mg/dL (ref 0.3–1.2)
Total Protein: 5.8 g/dL — ABNORMAL LOW (ref 6.0–8.3)

## 2012-09-16 LAB — POCT I-STAT, CHEM 8
Calcium, Ion: 1.3 mmol/L (ref 1.13–1.30)
Chloride: 105 mEq/L (ref 96–112)
HCT: 32 % — ABNORMAL LOW (ref 39.0–52.0)
Sodium: 139 mEq/L (ref 135–145)
TCO2: 22 mmol/L (ref 0–100)

## 2012-09-16 LAB — POCT I-STAT TROPONIN I: Troponin i, poc: 0.01 ng/mL (ref 0.00–0.08)

## 2012-09-16 LAB — PROTIME-INR
INR: 1.75 — ABNORMAL HIGH (ref 0.00–1.49)
Prothrombin Time: 19.9 seconds — ABNORMAL HIGH (ref 11.6–15.2)

## 2012-09-16 LAB — DIFFERENTIAL
Eosinophils Absolute: 0 10*3/uL (ref 0.0–0.7)
Lymphocytes Relative: 23 % (ref 12–46)
Lymphs Abs: 1.4 10*3/uL (ref 0.7–4.0)
Monocytes Relative: 21 % — ABNORMAL HIGH (ref 3–12)
Neutro Abs: 3.4 10*3/uL (ref 1.7–7.7)
Neutrophils Relative %: 56 % (ref 43–77)

## 2012-09-16 LAB — CBC
Hemoglobin: 10.8 g/dL — ABNORMAL LOW (ref 13.0–17.0)
Platelets: 128 10*3/uL — ABNORMAL LOW (ref 150–400)
RBC: 3.04 MIL/uL — ABNORMAL LOW (ref 4.22–5.81)
WBC: 6.1 10*3/uL (ref 4.0–10.5)

## 2012-09-16 MED ORDER — SODIUM CHLORIDE 0.9 % IJ SOLN: 10.0000 mL | Freq: Two times a day (BID) | INTRAMUSCULAR | Status: DC

## 2012-09-16 MED ORDER — FAMOTIDINE 20 MG PO TABS
20.0000 mg | ORAL_TABLET | Freq: Once | ORAL | Status: AC
  Administered 2012-09-16: 20 mg via ORAL
  Filled 2012-09-16: qty 1

## 2012-09-16 MED ORDER — GI COCKTAIL ~~LOC~~
30.0000 mL | Freq: Once | ORAL | Status: AC
  Administered 2012-09-16: 30 mL via ORAL
  Filled 2012-09-16: qty 30

## 2012-09-16 MED ORDER — SODIUM CHLORIDE 0.9 % IV SOLN: 1000.0000 mL | Freq: Every day | INTRAVENOUS | Status: DC

## 2012-09-16 MED ORDER — SODIUM CHLORIDE 0.9 % IJ SOLN
10.0000 mL | INTRAMUSCULAR | Status: DC | PRN
  Administered 2012-09-16: 20 mL

## 2012-09-16 NOTE — ED Provider Notes (Signed)
History    CSN: [id number] Arrival date & time 09/16/12  1246  First MD Initiated Contact with Patient 09/16/12 1308     Chief Complaint  Patient presents with  . Code Stroke   (Consider location/radiation/quality/duration/timing/severity/associated sxs/prior Treatment) HPI Pt with hx prior cva, gastric ca, afib c/o trouble speaking earlier today.  pt presents after noticing trouble speaking, speaking non sensical words earlier today while getting infusion of fluids/saline.  Started at 11 am. Contant. Now improving slowly. No change in vision. No numbness/weakness. No change in speech. Denies headache. States has had same symptoms in past including 5/14, had stroke workup which he reports was negative, symptoms resolved. Denies recent change in [facility] r new meds. Is on xarelto.   Past Medical History  Diagnosis Date  . Ramsay Hunt auricular syndrome   . HTN (hypertension)   . Osteoarthritis   . Dysrhythmia 04/17/12  . Anxiety   . Stroke   . H/O Bell's palsy at age 72  . Atrial flutter   . Atrial fibrillation   . Ileostomy in place 06/14/2012   Past Surgical History  Procedure Laterality Date  . Hernia repair    . Vasectomy    . Prostate biopsies      per Vonita Moss  . Cataract extraction      SE eye  . Colonoscopy N/A 06/03/2012    Procedure: COLONOSCOPY;  Surgeon: Florencia Reasons, MD;  Location: [facility] ENDOSCOPY;  Service: Endoscopy;  Laterality: N/A;  . Esophagogastroduodenoscopy N/A 06/03/2012    Procedure: ESOPHAGOGASTRODUODENOSCOPY (EGD);  Surgeon: Florencia Reasons, MD;  Location: [facility] ENDOSCOPY;  Service: Endoscopy;  Laterality: N/A;  . Laparoscopy N/A 06/13/2012    Procedure: EXPLORATORY LAPAROSCOPY ;  Surgeon: Liz Malady, MD;  Location: [facility] OR;  Service: General;  Laterality: N/A;  . Diverting ileostomy N/A 06/13/2012    Procedure: DIVERTING LOOP ILEOSTOMY;  Surgeon: Liz Malady, MD;  Location: [facility] OR;  Service: General;  Laterality: N/A;  . Lymph node biopsy Left  06/13/2012    Procedure: LYMPH NODE BIOPSY;  Surgeon: Liz Malady, MD;  Location: [facility] OR;  Service: General;  Laterality: Left;  Scalene Lymph Node Biopsy   Family History  Problem Relation Age of Onset  . Cancer Father   . Diabetes Father    History  Substance Use Topics  . Smoking status: Former Smoker    Quit date: 06/22/1984  . Smokeless tobacco: Never Used  . Alcohol Use: 0.0 oz/week     Comment: 1 glass of wine a day    Review of Systems  Constitutional: Negative for fever.  HENT: Negative for neck pain.   Eyes: Negative for redness.  Respiratory: Negative for shortness of breath.   Cardiovascular: Negative for chest pain.  Gastrointestinal: Negative for abdominal pain.  Genitourinary: Negative for flank pain.  Musculoskeletal: Negative for back pain.  Skin: Negative for rash.  Neurological: Positive for speech difficulty. Negative for weakness, numbness and headaches.  Hematological: Does not bruise/bleed easily.  Psychiatric/Behavioral: Negative for confusion.    Allergies  Penicillins  Home Medications   Current Outpatient Rx  Name  Route  Sig  Dispense  Refill  . capecitabine (XELODA) 500 MG tablet      Take #3 (1500 mg) po twice daily after meals for 14 days, then 7 day rest   84 tablet   2     FAXED TO [facility] [FAX #[phone]]   . diltiazem (CARDIZEM CD) 120 MG 24 hr capsule  Oral   Take 1 capsule (120 mg total) by mouth every morning. For atrial fibrillation   30 capsule   1   . ferrous sulfate 325 (65 FE) MG tablet   Oral   Take 1 tablet (325 mg total) by mouth 2 (two) times daily with a meal.   60 tablet   1   . HYDROcodone-acetaminophen (NORCO/VICODIN) 5-325 MG per tablet   Oral   Take 1 tablet by mouth as needed.         . megestrol (MEGACE ORAL) 40 MG/ML suspension   Oral   Take 5 mLs (200 mg total) by mouth 2 (two) times daily.   300 mL   2   . Multiple Vitamins-Minerals (PRESERVISION AREDS PO)   Oral   Take 1 tablet  by mouth 2 (two) times daily.         Marland Kitchen omeprazole (PRILOSEC) 20 MG capsule   Oral   Take 20 mg by mouth 2 (two) times daily.          . ondansetron (ZOFRAN) 4 MG tablet   Oral   Take 4 mg by mouth every 8 (eight) hours as needed for nausea.         . prochlorperazine (COMPAZINE) 10 MG tablet   Oral   Take 10 mg by mouth every 6 (six) hours as needed (nausea).         . Rivaroxaban (XARELTO) 20 MG TABS   Oral   Take 20 mg by mouth every evening.         . sodium chloride 0.9 % infusion   Intravenous   Inject 1,000 mLs into the vein daily.   1000 mL   21     IVFluids at home daily via PICC line.    BP 123/87  Pulse 74  Temp(Src) 98 F (36.7 C)  SpO2 99% Physical Exam  Nursing note and vitals reviewed. Constitutional: He is oriented to person, place, and time. He appears well-developed and well-nourished. No distress.  HENT:  Head: Atraumatic.  Mouth/Throat: Oropharynx is clear and moist.  Eyes: Conjunctivae are normal. Pupils are equal, round, and reactive to light.  Neck: Neck supple. No tracheal deviation present.  No bruit  Cardiovascular: Normal rate, normal heart sounds and intact distal pulses.   Pulmonary/Chest: Effort normal and breath sounds normal. No accessory muscle usage. No respiratory distress.  Abdominal: Soft. Bowel sounds are normal. He exhibits no distension. There is no tenderness.  Musculoskeletal: Normal range of motion. He exhibits no edema and no tenderness.  Neurological: He is alert and oriented to person, place, and time. No cranial nerve deficit.  Speech fluent. Motor intact bil. 5/5.   Skin: Skin is warm and dry.  Psychiatric: He has a normal mood and affect.    ED Course  Procedures (including critical care time)   MDM  Iv ns. Labs. Ct. Code stroke.    Reviewed nursing notes and prior charts for additional history.    Date: 09/16/2012  Rate: 74  Rhythm: normal sinus rhythm  QRS Axis: normal  Intervals: normal   ST/T Wave abnormalities: normal  Conduction Disutrbances:none  Narrative Interpretation:   Old EKG Reviewed: unchanged  Discussed pt with Dr Roseanne Reno, neurology, he indicates he has seen pt, reviewed ct, recent complete cva workup and symptoms resolved. He indicates to d/c home, no change in meds.   Discussed plan w pt. Symptoms resolved. Stable for d/c.        Suzi Roots,  MD 09/16/12 1408

## 2012-09-16 NOTE — ED Notes (Signed)
Code Stroke called to charge RN at this time. Pt heading to CT.

## 2012-09-16 NOTE — Telephone Encounter (Signed)
Returned call to wife informing her orders have been faxed. She reports pt's aphasia has returned. Was instructed by neurologist to wait :30 minutes to see if it resolves. She reports she is taking him to [facility] ED as it has lasted longer than 30 minutes. Dr. Truett Perna made aware.

## 2012-09-16 NOTE — ED Notes (Addendum)
PT on chemo and hx of dehydration with PICC line for IV fluids at home; on xarelto (blood thinner) at home. Wife reports that his speech changed and was unable to answer questions about how long they have been married and who Economist. PT can recall date but not who President is. A&O to himself and situation and time. Last Seen Normal: 1030 today. Hx of strokes x 2. Code stroke called.

## 2012-09-16 NOTE — ED Notes (Signed)
Pt in scanner.

## 2012-09-16 NOTE — ED Notes (Signed)
Pt. Arrived to Exam RM 15.  Dr. Denton Lank at the bedside.

## 2012-09-16 NOTE — ED Notes (Signed)
Right arm has PICC line placed.

## 2012-09-16 NOTE — Code Documentation (Signed)
79 yo male who is immuno-compromised from chemotherapy, and currently being treated with Xarelto for a fib. He has had numerous episodes of TIA, and experienced an event of sudden speech difficulty this AM.  Code stroke was activated at 1337 following a CT scan which was unremarkable. When code team arrived at 1342, sx had resolved.  Pt and wife so frustrated by "unnecessary" ED visits, but reassured they had done the right thing. NIHSS=1, with one point scored for baseline L facial droop (h/o Ramsey Hunt synd).  Pt does c/o epigastric burning (intermittent, chronic issue since on chemo), so ST called to do swallow screen since pt has been told to swallow twice to decrease risk of aspiration. Pt and wife aware of plan and agreeable. Handoff done with ED RN.

## 2012-09-16 NOTE — Telephone Encounter (Signed)
Message from pt's wife reporting he has not been able to keep up with oral hydration. Asks to resume daily IV fluids. Reviewed with Dr. Truett Perna, order received and faxed to [facility].

## 2012-09-16 NOTE — ED Notes (Signed)
Spoke with Misty Stanley , Speech Therapy she will be down at the bedside to evaluate pt.s swallowing

## 2012-09-16 NOTE — Consult Note (Signed)
Referring Physician: Meredith Mody    Chief Complaint: Transient aphasia and word finding difficulty--now resolved.   HPI:                                                                                                                                         [name] [name] is an 79 y.o. male with known gastric cancer, previous CVA and  Afib on Xeralto who was at home today receiving IV fluids when wife noted he was having difficulty with his thoughts and forming his words. Patient was brought to [facility] hospital where his symptoms resolved while in the ED.  Patient was recently seen on 07/24/12 for similar symptoms. At that time MRI brain was normal only showing remote left frontal infarct and MRA of brain showed no significant proximal stenosis, aneurysm, or branch vessel occlusion.  CT head today showed no acute infarct or bleed and EEG showed NSR. Currently patient is back to his baseline ( residual left facial droop from previous Ramsy hunt/bell's palsy) and having no complaints.    Date last known well: 6.30.14 Time last known well: 10:30 tPA Given: No: on Xeralto  Past Medical History  Diagnosis Date  . Ramsay Hunt auricular syndrome   . HTN (hypertension)   . Osteoarthritis   . Dysrhythmia 04/17/12  . Anxiety   . Stroke   . H/O Bell's palsy at age 72  . Atrial flutter   . Atrial fibrillation   . Ileostomy in place 06/14/2012    Past Surgical History  Procedure Laterality Date  . Hernia repair    . Vasectomy    . Prostate biopsies      per Vonita Moss  . Cataract extraction      SE eye  . Colonoscopy N/A 06/03/2012    Procedure: COLONOSCOPY;  Surgeon: Florencia Reasons, MD;  Location: [facility] ENDOSCOPY;  Service: Endoscopy;  Laterality: N/A;  . Esophagogastroduodenoscopy N/A 06/03/2012    Procedure: ESOPHAGOGASTRODUODENOSCOPY (EGD);  Surgeon: Florencia Reasons, MD;  Location: [facility] ENDOSCOPY;  Service: Endoscopy;  Laterality: N/A;  . Laparoscopy N/A 06/13/2012    Procedure: EXPLORATORY LAPAROSCOPY ;   Surgeon: Liz Malady, MD;  Location: [facility] OR;  Service: General;  Laterality: N/A;  . Diverting ileostomy N/A 06/13/2012    Procedure: DIVERTING LOOP ILEOSTOMY;  Surgeon: Liz Malady, MD;  Location: [facility] OR;  Service: General;  Laterality: N/A;  . Lymph node biopsy Left 06/13/2012    Procedure: LYMPH NODE BIOPSY;  Surgeon: Liz Malady, MD;  Location: [facility] OR;  Service: General;  Laterality: Left;  [place] Lymph Node Biopsy    Family History  Problem Relation Age of Onset  . Cancer Father   . Diabetes Father    Social History:  reports that he quit smoking about 28 years ago. He has never used smokeless tobacco. He reports that  drinks alcohol. He reports that he does not  use illicit drugs.  Allergies:  Allergies  Allergen Reactions  . Penicillins Rash    Rash appeared on palms of hands and feet.    Medications:                                                                                                                           Current Facility-Administered Medications  Medication Dose Route Frequency Provider Last Rate Last Dose  . famotidine (PEPCID) tablet 20 mg  20 mg Oral Once Suzi Roots, MD      . gi cocktail (Maalox,Lidocaine,Donnatal)  30 mL Oral Once Suzi Roots, MD       Current Outpatient Prescriptions  Medication Sig Dispense Refill  . capecitabine (XELODA) 500 MG tablet Take 1,500 mg by mouth 2 (two) times daily after a meal. For 14 days, 09/16/12 is day 4      . Dextrose-Sodium Chloride (DEXTROSE 5 % AND 0.9% NACL) 5-0.9 % Inject 1,000 mLs into the vein daily.      Marland Kitchen diltiazem (CARDIZEM CD) 120 MG 24 hr capsule Take 1 capsule (120 mg total) by mouth every morning. For atrial fibrillation  30 capsule  1  . ferrous sulfate 325 (65 FE) MG tablet Take 1 tablet (325 mg total) by mouth 2 (two) times daily with a meal.  60 tablet  1  . HYDROcodone-acetaminophen (NORCO/VICODIN) 5-325 MG per tablet Take 1 tablet by mouth daily as needed for pain.       .  megestrol (MEGACE ORAL) 40 MG/ML suspension Take 200 mg by mouth 2 (two) times daily as needed (for appetite).   300 mL  2  . Multiple Vitamins-Minerals (PRESERVISION AREDS PO) Take 1 tablet by mouth 2 (two) times daily.      Marland Kitchen omeprazole (PRILOSEC) 20 MG capsule Take 20 mg by mouth 2 (two) times daily.       . ondansetron (ZOFRAN) 4 MG tablet Take 4 mg by mouth every 8 (eight) hours as needed for nausea.      . Rivaroxaban (XARELTO) 20 MG TABS Take 20 mg by mouth every evening.         ROS:                                                                                                                                       History obtained from the patient  General ROS: negative for - chills, fatigue, fever, night sweats, weight gain or weight loss Psychological ROS: negative for - behavioral disorder, hallucinations, memory difficulties, mood swings or suicidal ideation Ophthalmic ROS: negative for - blurry vision, double vision, eye pain or loss of vision ENT ROS: negative for - epistaxis, nasal discharge, oral lesions, sore throat, tinnitus or vertigo Allergy and Immunology ROS: negative for - hives or itchy/watery eyes Hematological and Lymphatic ROS: negative for - bleeding problems, bruising or swollen lymph nodes Endocrine ROS: negative for - galactorrhea, hair pattern changes, polydipsia/polyuria or temperature intolerance Respiratory ROS: negative for - cough, hemoptysis, shortness of breath or wheezing Cardiovascular ROS: negative for - chest pain, dyspnea on exertion, edema or irregular heartbeat Gastrointestinal ROS: negative for - abdominal pain, diarrhea, hematemesis, nausea/vomiting or stool incontinence Genito-Urinary ROS: negative for - dysuria, hematuria, incontinence or urinary frequency/urgency Musculoskeletal ROS: negative for - joint swelling or muscular weakness Neurological ROS: as noted in HPI Dermatological ROS: negative for rash and skin lesion changes  Neurologic  Examination:                                                                                                      Blood pressure 123/87, pulse 74, temperature 98 F (36.7 C), SpO2 99.00%.   Mental Status: Alert, oriented, thought content appropriate.  Speech fluent without evidence of aphasia.  Able to follow 3 step commands without difficulty. Cranial Nerves: II: Discs flat bilaterally; Visual fields grossly normal, pupils equal, round, reactive to light and accommodation III,IV, VI: ptosis not present, extra-ocular motions intact bilaterally V,VII: smile asymmetric on the left, facial light touch sensation decreased on the left (old from Ramsy hunt syndrome) VIII: hearing normal bilaterally IX,X: gag reflex present XI: bilateral shoulder shrug XII: midline tongue extension Motor: Right : Upper extremity   5/5    Left:     Upper extremity   5/5  Lower extremity   5/5     Lower extremity   5/5 Tone and bulk:normal tone throughout; no atrophy noted Sensory: Pinprick and light touch intact throughout, bilaterally Deep Tendon Reflexes:  Right: Upper Extremity   Left: Upper extremity   biceps (C-5 to C-6) 2/4   biceps (C-5 to C-6) 2/4 tricep (C7) 2/4    triceps (C7) 2/4 Brachioradialis (C6) 2/4  Brachioradialis (C6) 2/4  Lower Extremity Lower Extremity  quadriceps (L-2 to L-4) 2/4   quadriceps (L-2 to L-4) 2/4 Achilles (S1) 2/4   Achilles (S1) 2/4  Plantars: Right: downgoing   Left: downgoing Cerebellar: normal finger-to-nose,  normal heel-to-shin test CV: pulses palpable throughout    Results for orders placed during the hospital encounter of 09/16/12 (from the past 48 hour(s))  CBC     Status: Abnormal   Collection Time    09/16/12  1:54 PM      Result Value Range   WBC 6.1  4.0 - 10.5 K/uL   RBC 3.04 (*) 4.22 - 5.81 MIL/uL   Hemoglobin 10.8 (*) 13.0 - 17.0 g/dL   HCT 16.1 (*) 09.6 - 04.5 %   MCV  101.3 (*) 78.0 - 100.0 fL   MCH 35.5 (*) 26.0 - 34.0 pg   MCHC 35.1  30.0  - 36.0 g/dL   RDW 96.0 (*) 45.4 - 09.8 %   Platelets 128 (*) 150 - 400 K/uL  DIFFERENTIAL     Status: Abnormal   Collection Time    09/16/12  1:54 PM      Result Value Range   Neutrophils Relative % 56  43 - 77 %   Neutro Abs 3.4  1.7 - 7.7 K/uL   Lymphocytes Relative 23  12 - 46 %   Lymphs Abs 1.4  0.7 - 4.0 K/uL   Monocytes Relative 21 (*) 3 - 12 %   Monocytes Absolute 1.3 (*) 0.1 - 1.0 K/uL   Eosinophils Relative 0  0 - 5 %   Eosinophils Absolute 0.0  0.0 - 0.7 K/uL   Basophils Relative 0  0 - 1 %   Basophils Absolute 0.0  0.0 - 0.1 K/uL  POCT I-STAT, CHEM 8     Status: Abnormal   Collection Time    09/16/12  1:54 PM      Result Value Range   Sodium 139  135 - 145 mEq/L   Potassium 3.8  3.5 - 5.1 mEq/L   Chloride 105  96 - 112 mEq/L   BUN 16  6 - 23 mg/dL   Creatinine, Ser 1.19  0.50 - 1.35 mg/dL   Glucose, Bld 147 (*) 70 - 99 mg/dL   Calcium, Ion 8.29  5.62 - 1.30 mmol/L   TCO2 22  0 - 100 mmol/L   Hemoglobin 10.9 (*) 13.0 - 17.0 g/dL   HCT 13.0 (*) 86.5 - 78.4 %   Ct Head Wo Contrast  09/16/2012   *RADIOLOGY REPORT*  Clinical Data:  code stroke.  Slurred speech.  CT HEAD WITHOUT CONTRAST  Technique:  Contiguous axial images were obtained from the base of the skull through the vertex without contrast.  Comparison: CT 07/24/2012  Findings: Generalized atrophy.  Chronic microvascular ischemic changes in the white matter bilaterally.  Small chronic infarct right cerebellum.  No acute infarct, hemorrhage, or mass lesion.  No hydrocephalus. No new findings.  Chronic sinusitis with near complete opacification of the left maxillary sinus containing hyperdense secretions.  This is unchanged from the  prior study.  No acute bony abnormality.  IMPRESSION: Atrophy and chronic ischemic changes.  No acute abnormality.  Chronic sinusitis.   Original Report Authenticated By: Janeece Riggers, M.D.   Carotid doppler 07/25/12 Summary:  - Other specific details can be found in the table(s) above. -  No significant ICA stenosis (0-39%) noted in the ICA bilaterally. Vertebral artery flow is antegrade. Prepared and Electronically Authenticated by  2 D echo 06/04/12: Study Conclusions  - Left ventricle: The cavity size was mildly dilated. Wall thickness was normal. Systolic function was normal. The estimated ejection fraction was in the range of 50% to 55%. - Left atrium: The atrium was mildly dilated.   Assessment: 79 y.o. male presenting to the ED after wife noted transient word finding difficulty and expressive aphasia.   Patient is also currently on Xeralto, NSR and no acute stroke or bleed on CT scan of head. Previous 2 D echo and carotid dopplers negative for abnormalities.  PAtient is not a tPA candidate due to being on Xeralto and symptoms have fully cleared at this time.   Stroke Risk Factors - atrial fibrillation, hypertension and stroke  Plan: 1. Continue prophylactic  therapy-Anticoagulation: Xeralto 2. Risk factor modification  Felicie Morn PA-C [facility] [phone]  I personally participated in this patient's evaluation and management, including formulating above clinical impression and management recommendations.  Venetia Maxon M.D. [facility] [phone]

## 2012-09-16 NOTE — ED Notes (Signed)
Dr. Denton Lank Cleared pt. To take his medication.

## 2012-09-17 ENCOUNTER — Encounter: Payer: Self-pay | Admitting: Neurology

## 2012-09-17 DIAGNOSIS — I6381 Other cerebral infarction due to occlusion or stenosis of small artery: Secondary | ICD-10-CM | POA: Insufficient documentation

## 2012-09-17 DIAGNOSIS — G510 Bell's palsy: Secondary | ICD-10-CM | POA: Insufficient documentation

## 2012-09-23 ENCOUNTER — Other Ambulatory Visit: Payer: Self-pay | Admitting: Certified Registered Nurse Anesthetist

## 2012-09-25 ENCOUNTER — Encounter: Payer: Self-pay | Admitting: Neurology

## 2012-09-25 ENCOUNTER — Ambulatory Visit (INDEPENDENT_AMBULATORY_CARE_PROVIDER_SITE_OTHER): Payer: Medicare Other | Admitting: Neurology

## 2012-09-25 VITALS — BP 112/68 | HR 73 | Temp 97.8°F | Ht 73.0 in | Wt 180.0 lb

## 2012-09-25 DIAGNOSIS — G40209 Localization-related (focal) (partial) symptomatic epilepsy and epileptic syndromes with complex partial seizures, not intractable, without status epilepticus: Secondary | ICD-10-CM

## 2012-09-25 MED ORDER — LEVETIRACETAM 500 MG PO TABS: 250.0000 mg | ORAL_TABLET | Freq: Two times a day (BID) | ORAL | Status: DC

## 2012-09-25 NOTE — Patient Instructions (Addendum)
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 Check EEG for silent seizures. Continue xarelto for stroke prevention. Return for followup in 3 months or call earlier if necessary.

## 2012-09-26 ENCOUNTER — Telehealth: Payer: Self-pay | Admitting: Neurology

## 2012-09-26 ENCOUNTER — Other Ambulatory Visit (INDEPENDENT_AMBULATORY_CARE_PROVIDER_SITE_OTHER): Payer: Medicare Other | Admitting: Radiology

## 2012-09-26 DIAGNOSIS — G40209 Localization-related (focal) (partial) symptomatic epilepsy and epileptic syndromes with complex partial seizures, not intractable, without status epilepticus: Secondary | ICD-10-CM

## 2012-09-26 MED ORDER — LEVETIRACETAM 500 MG PO TABS: ORAL_TABLET | ORAL | Status: DC

## 2012-09-26 NOTE — Telephone Encounter (Signed)
Rx re-sent to [facility] 

## 2012-09-26 NOTE — Addendum Note (Signed)
Addended by: Lucille Passy C on: 09/26/2012 01:37 PM   Modules accepted: Orders

## 2012-09-26 NOTE — Telephone Encounter (Signed)
Patient called back and wants Rx sent to [facility] on [address] instead.  Rx has been resent.

## 2012-09-27 NOTE — Progress Notes (Signed)
[facility] [address] [place]. [place] [address] [phone]       OFFICE FOLLOW-UP NOTE  [name] Date of Birth:  Sep 23, 1930 Medical Record Number:  [id number]   HPI: 79 year old Gentleman with left parietal infarct in December 2012 followed by recurrent episodes of transient expressive aphasia - TIA versus simple partial seizures who was started on Pradaxa for atrial fibrillation but developed GI bleed.   He has had further recurrent episodes of transient speech disturbance for which he was hospitalized in January to [facility] with negative workup as well as had recurrent admission in May as well. MRI scan of the brain 07/25/10 showed no acute abnormality. EEG 07/25/12 was negative for seizures. Patient was on Pradaxa for atrial fibrillation but developed GI bleed and it was subsequently switched to xarelto.Marland Kitchen He states he continues to have this recurrence to her typical episodes of transient speech disturbance often . It is unclear to me whether these episodes are present TIAs related to hypercarbia from his cancer and they're bothersome continued despite optimal anticoagulation and perhaps could be simple partial seizures even though her EEG was negative. He has not been tried on a trial of anticonvulsants yet.  ROS:   14 system review of systems is positive for weight loss, fatigue, hearing loss, feeling cold, importance, decreased energy, chills.  PMH:  Past Medical History  Diagnosis Date  . Ramsay Hunt auricular syndrome   . HTN (hypertension)   . Osteoarthritis   . Dysrhythmia 04/17/12  . Anxiety   . Stroke   . H/O Bell's palsy at age 29  . Atrial flutter   . Atrial fibrillation   . Ileostomy in place 06/14/2012    Social History:  History   Social History  . Marital Status: Married    Spouse Name: Harriett Sine    Number of Children: 2  . Years of Education: Ph.D   Occupational History  . Retired- HR Manager/Traininer     [employer]   Social History Main Topics  . Smoking status: Former Smoker -- 0.50 packs/day for 30 years    Types: Cigarettes    Quit date: 06/22/1984  . Smokeless tobacco: Never Used  . Alcohol Use: 0.0 oz/week     Comment: 1 glass of wine a day; quit in 05/2012  . Drug Use: No  . Sexually Active: Not on file   Other Topics Concern  . Not on file   Social History Narrative   Health Care POA: wife, Thereasa Parkin   Emergency Contact: Thereasa Parkin, (c) [phone]   End of Life Plan: DNR   Who lives with you: Lives with wife, Harriett Sine   Any pets: coon hound, Cookie   Diet: Patient has a varied diet of protein, starch, vegetables.   Exercise: Patient walks or does yoga 5 x a week.   Seatbelts: Patient reports wearing a seat belt when in vehicle.   Sun Exposure/Protection: Patient reports infrequent use of sun screen.   Hobbies: Walking, running, politics      Patient lives at home with his wife. Patient is retired with a Event organiser. Patient has 2 children. Patient quit smoking in 1990. Patient drinks wine  and is limited to caffine and denies all illict drugs.    Medications:   Current Outpatient Prescriptions on File Prior to Visit  Medication Sig Dispense Refill  . capecitabine (XELODA) 500 MG tablet Take 1,500 mg by mouth 2 (two) times daily after a  meal. For 14 days, 09/16/12 is day 4      . Dextrose-Sodium Chloride (DEXTROSE 5 % AND 0.9% NACL) 5-0.9 % Inject 1,000 mLs into the vein daily.      Marland Kitchen diltiazem (CARDIZEM CD) 120 MG 24 hr capsule Take 1 capsule (120 mg total) by mouth every morning. For atrial fibrillation  30 capsule  1  . ferrous sulfate 325 (65 FE) MG tablet Take 1 tablet (325 mg total) by mouth 2 (two) times daily with a meal.  60 tablet  1  . HYDROcodone-acetaminophen (NORCO/VICODIN) 5-325 MG per tablet Take 1 tablet by mouth daily as needed for pain.       . megestrol (MEGACE ORAL) 40 MG/ML suspension Take 200 mg by mouth 2 (two) times daily as needed (for  appetite).   300 mL  2  . Multiple Vitamins-Minerals (PRESERVISION AREDS PO) Take 1 tablet by mouth 2 (two) times daily.      Marland Kitchen omeprazole (PRILOSEC) 20 MG capsule Take 20 mg by mouth 2 (two) times daily.       . ondansetron (ZOFRAN) 4 MG tablet Take 4 mg by mouth every 8 (eight) hours as needed for nausea.      . prochlorperazine (COMPAZINE) 10 MG tablet       . Rivaroxaban (XARELTO) 20 MG TABS Take 20 mg by mouth every evening.       No current facility-administered medications on file prior to visit.    Allergies:   Allergies  Allergen Reactions  . Penicillins Rash    Rash appeared on palms of hands and feet.   Filed Vitals:   09/25/12 1523  BP: 112/68  Pulse: 73  Temp: 97.8 F (36.6 C)    Physical Exam General: well developed, well nourished elderly Caucasian male, seated, in no evident distress Head: head normocephalic and atraumatic. Orohparynx benign Neck: supple with no carotid or supraclavicular bruits Cardiovascular: regular rate and rhythm, no murmurs Musculoskeletal: no deformity Skin:  no rash/petichiae Vascular:  Normal pulses all extremities  Neurologic Exam Mental Status: Awake and fully alert. Oriented to place and time. Recent and remote memory intact. Attention span, concentration and fund of knowledge appropriate. Mood and affect appropriate.  Cranial Nerves: Fundoscopic exam reveals sharp disc margins. Pupils equal, briskly reactive to light. Extraocular movements full without nystagmus. Visual fields full to confrontation. Hearing is diminished on the left. Left and facial weakness from old Ramsay Hunt syndrome.Tongue, palate moves normally and symmetrically.  Motor: Normal bulk and tone. Normal strength in all tested extremity muscles. Diminished fine finger movements on the left. Orbits right over left approximately. Sensory.: intact to tough and pinprick and vibratory.  Coordination: Rapid alternating movements normal in all extremities. Finger-to-nose  and heel-to-shin performed accurately bilaterally. Gait and Station: Arises from chair without difficulty. Stance is normal. Gait demonstrates normal stride length and balance . Not able to heel, toe and tandem walk without difficulty. Leans slightly to the left while walking Reflexes: 1+ and symmetric. Toes downgoing.     ASSESSMENT:  79 year old Gentleman with left parietal infarct in December 2012 followed by recurrent episodes of transient expressive aphasia - TIA versus simple partial seizures who was started on Pradaxa for atrial fibrillation but developed GI bleed.     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 Check EEG for silent seizures. Continue xarelto for stroke  prevention. Return for followup in 3 months or call earlier if necessary.

## 2012-09-29 ENCOUNTER — Other Ambulatory Visit: Payer: Self-pay | Admitting: Oncology

## 2012-10-01 ENCOUNTER — Encounter: Payer: Self-pay | Admitting: *Deleted

## 2012-10-01 NOTE — Progress Notes (Signed)
RECEIVED A FAX FROM [facility] CONCERNING A CONFIRMATION OF PRESCRIPTION SHIPMENT FOR CAPECITABINE ON 09/30/12.

## 2012-10-03 ENCOUNTER — Telehealth: Payer: Self-pay | Admitting: Oncology

## 2012-10-03 ENCOUNTER — Ambulatory Visit (HOSPITAL_BASED_OUTPATIENT_CLINIC_OR_DEPARTMENT_OTHER): Payer: Medicare Other

## 2012-10-03 ENCOUNTER — Telehealth: Payer: Self-pay | Admitting: *Deleted

## 2012-10-03 ENCOUNTER — Ambulatory Visit (HOSPITAL_BASED_OUTPATIENT_CLINIC_OR_DEPARTMENT_OTHER): Payer: Medicare Other | Admitting: Oncology

## 2012-10-03 ENCOUNTER — Other Ambulatory Visit (HOSPITAL_BASED_OUTPATIENT_CLINIC_OR_DEPARTMENT_OTHER): Payer: Medicare Other | Admitting: Lab

## 2012-10-03 VITALS — BP 123/85 | HR 76 | Temp 97.6°F | Resp 20 | Ht 73.0 in | Wt 181.7 lb

## 2012-10-03 DIAGNOSIS — C169 Malignant neoplasm of stomach, unspecified: Secondary | ICD-10-CM

## 2012-10-03 DIAGNOSIS — C184 Malignant neoplasm of transverse colon: Secondary | ICD-10-CM

## 2012-10-03 DIAGNOSIS — Z5111 Encounter for antineoplastic chemotherapy: Secondary | ICD-10-CM

## 2012-10-03 DIAGNOSIS — C770 Secondary and unspecified malignant neoplasm of lymph nodes of head, face and neck: Secondary | ICD-10-CM

## 2012-10-03 LAB — CBC WITH DIFFERENTIAL/PLATELET
EOS%: 3.5 % (ref 0.0–7.0)
MCH: 36.7 pg — ABNORMAL HIGH (ref 27.2–33.4)
MCV: 108 fL — ABNORMAL HIGH (ref 79.3–98.0)
MONO%: 21.8 % — ABNORMAL HIGH (ref 0.0–14.0)
NEUT#: 2.5 10*3/uL (ref 1.5–6.5)
RBC: 3 10*6/uL — ABNORMAL LOW (ref 4.20–5.82)
RDW: 21.8 % — ABNORMAL HIGH (ref 11.0–14.6)

## 2012-10-03 LAB — COMPREHENSIVE METABOLIC PANEL (CC13)
ALT: 27 U/L (ref 0–55)
AST: 29 U/L (ref 5–34)
Albumin: 3.1 g/dL — ABNORMAL LOW (ref 3.5–5.0)
Alkaline Phosphatase: 114 U/L (ref 40–150)
Potassium: 4.2 mEq/L (ref 3.5–5.1)
Sodium: 139 mEq/L (ref 136–145)
Total Protein: 6 g/dL — ABNORMAL LOW (ref 6.4–8.3)

## 2012-10-03 MED ORDER — ONDANSETRON 8 MG/50ML IVPB (CHCC)
8.0000 mg | Freq: Once | INTRAVENOUS | Status: AC
  Administered 2012-10-03: 8 mg via INTRAVENOUS

## 2012-10-03 MED ORDER — OXALIPLATIN CHEMO INJECTION 100 MG/20ML
85.0000 mg/m2 | Freq: Once | INTRAVENOUS | Status: AC
  Administered 2012-10-03: 170 mg via INTRAVENOUS
  Filled 2012-10-03: qty 34

## 2012-10-03 MED ORDER — HEPARIN SOD (PORK) LOCK FLUSH 100 UNIT/ML IV SOLN
250.0000 [IU] | Freq: Once | INTRAVENOUS | Status: AC | PRN
  Administered 2012-10-03: 14:00:00
  Filled 2012-10-03: qty 5

## 2012-10-03 MED ORDER — SODIUM CHLORIDE 0.9 % IJ SOLN
3.0000 mL | INTRAMUSCULAR | Status: DC | PRN
  Administered 2012-10-03: 14:00:00 via INTRAVENOUS
  Filled 2012-10-03: qty 10

## 2012-10-03 MED ORDER — SODIUM CHLORIDE 0.9 % IJ SOLN
10.0000 mL | INTRAMUSCULAR | Status: DC | PRN
  Filled 2012-10-03: qty 10

## 2012-10-03 MED ORDER — DEXAMETHASONE SODIUM PHOSPHATE 10 MG/ML IJ SOLN
10.0000 mg | Freq: Once | INTRAMUSCULAR | Status: AC
  Administered 2012-10-03: 10 mg via INTRAVENOUS

## 2012-10-03 MED ORDER — HEPARIN SOD (PORK) LOCK FLUSH 100 UNIT/ML IV SOLN
500.0000 [IU] | Freq: Once | INTRAVENOUS | Status: DC | PRN
  Filled 2012-10-03: qty 5

## 2012-10-03 MED ORDER — DEXTROSE 5 % IV SOLN
Freq: Once | INTRAVENOUS | Status: AC
  Administered 2012-10-03: 11:00:00 via INTRAVENOUS

## 2012-10-03 NOTE — Telephone Encounter (Signed)
Per staff message and POF I have scheduled appts.  JMW  

## 2012-10-03 NOTE — Telephone Encounter (Signed)
Gave pt appt for lab,ML and chemo on August 2014

## 2012-10-03 NOTE — Patient Instructions (Addendum)
[facility] Discharge Instructions for Patients Receiving Chemotherapy  Today you received the following chemotherapy agents: Oxaliplatin  To help prevent nausea and vomiting after your treatment, we encourage you to take your nausea medication as prescribed.    If you develop nausea and vomiting that is not controlled by your nausea medication, call the clinic.   BELOW ARE SYMPTOMS THAT SHOULD BE REPORTED IMMEDIATELY:  *FEVER GREATER THAN 100.5 F  *CHILLS WITH OR WITHOUT FEVER  NAUSEA AND VOMITING THAT IS NOT CONTROLLED WITH YOUR NAUSEA MEDICATION  *UNUSUAL SHORTNESS OF BREATH  *UNUSUAL BRUISING OR BLEEDING  TENDERNESS IN MOUTH AND THROAT WITH OR WITHOUT PRESENCE OF ULCERS  *URINARY PROBLEMS  *BOWEL PROBLEMS  UNUSUAL RASH Items with * indicate a potential emergency and should be followed up as soon as possible.  Feel free to call the clinic you have any questions or concerns. The clinic phone number is [phone].    

## 2012-10-03 NOTE — Progress Notes (Signed)
[facility]    OFFICE PROGRESS NOTE   INTERVAL HISTORY:   He returns as scheduled. He completed another cycle of CAPOX beginning on 09/12/2012. He reports cold sensitivity lasting for 1 week after the oxaliplatin. No mouth sores, diarrhea, or hand/foot pain. Good appetite in the mornings. He reports "heartburn "when supine. He had another episode of expressive aphasia lasting for approximately 1 hour on 09/17/2012. He saw Dr. Pearlean Brownie and was placed on a trial of Keppra. He denies peripheral numbness.  The ostomy output has partially diminished with adding bananas to his diet. He has noted abdominal fullness around the ostomy site.  He is exercising at the [facility] 2-3 days per week and walks regularly.  Objective:  Vital signs in last 24 hours:  Blood pressure 123/85, pulse 76, temperature 97.6 F (36.4 C), temperature source Oral, resp. rate 20, height 6\' 1"  (1.854 m), weight 181 lb 11.2 oz (82.419 kg).    HEENT: No thrush or ulcer Lymphatics: No cervical, supraclavicular, or axillary nodes Resp: Lungs clear bilaterally Cardio: Regular rate and rhythm GI: No hepatomegaly, no apparent ascites, soft reducible fullness surrounding the ostomy wafer Vascular: Trace ankle edema bilaterally Neuro: Alert and oriented, left facial droop, moderate decrease in vibratory sense at the fingertips bilaterally  Skin: Palms without erythema   Portacath/PICC-without erythema  Lab Results:  Lab Results  Component Value Date   WBC 5.1 10/03/2012   HGB 11.0* 10/03/2012   HCT 32.4* 10/03/2012   MCV 108.0* 10/03/2012   PLT 169 10/03/2012   ANC 2.5    Medications: I have reviewed the patient's current medications.  Assessment/Plan: 1.Metastatic gastric cancer-adenocarcinoma documented on the biopsy of the stomach and transverse colon. Exploratory laparoscopy 06/13/2012 with no tumor seen, positive cytology for metastatic adenocarcinoma. Biopsy of a left scalene nodal 06/13/2012  confirmed metastatic adenocarcinoma. Received first cycle CAPOX last Thursday on 07/11/2012. He started Xeloda but held it after 2 days due to hypotension, diarrhea and dehydration. The Xeloda was resumed 07/22/2012. He received cycle 2 CAPOX beginning 08/01/2012, cycle 3 on 08/23/2012 , cycle 4 on 09/12/2012 2. Partial colonic obstruction secondary to tumor invading the transverse colon-status post a palliative diverting distal ileostomy 06/10/2012.  3. History of atrial fibrillation-maintained on xarelto.  4. Anorexia/weight loss secondary to metastatic gastric cancer and the colonic obstruction. His appetite has improved and he has gained weight  5. Dehydration - due to copious output from ileostomy, poor oral intake and diarrhea. Not improved with Imodium and Metamucil. The diarrhea improved when he began paregoric. He is now taking 1 L of IV fluids daily via a PICC. He is no longer taking paregoric. He notes the ileostomy output is "thicker" after beginning eating bananas.  6. Subxiphoid episodic pain on 07/16/2012 -resolved.  7. Admission with transient dysarthria 07/24/2012-no acute change on CNS imaging, he reports recurrent episodes of dysarthria since discharge from the hospital. Episode of transient left arm weakness during the week of 09/02/2012-spontaneously resolved. He had another episode of expressive aphasia on 09/17/2012. He was placed on Keppra by Dr. Pearlean Brownie 8. Moderate loss of vibratory sense at the fingers bilaterally-? Oxaliplatin neuropathy   Disposition:  He appears stable. He is tolerating the CAPOX well. There is no clinical evidence for progression of the cancer. We discussed the indication for a restaging evaluation. He does not have a significant amount of measurable disease. I will review the CT from March of 2014 to see if there is measurable disease at the colon or  stomach. We could also consider a repeat endoscopy or colonoscopy. No discrete mass was seen at the time of the  endoscopies on 06/03/2012, but there was significant mucosal edema. The plan is to continue at least 6 cycles of CAPOX.  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 He will contact Dr. Janee Morn if this progresses.   Thornton Papas, MD  10/03/2012  6:35 PM

## 2012-10-07 ENCOUNTER — Telehealth: Payer: Self-pay | Admitting: Neurology

## 2012-10-07 NOTE — Telephone Encounter (Signed)
Pt called wants someone to call him concerning his results from his EEG from [facility]: 09/26/12. Thanks

## 2012-10-07 NOTE — Telephone Encounter (Signed)
Called patient, informed messge recvd and fwd to Dr. Pearlean Brownie.

## 2012-10-09 ENCOUNTER — Telehealth: Payer: Self-pay | Admitting: *Deleted

## 2012-10-09 NOTE — Telephone Encounter (Signed)
Per Dr. Truett Perna, OK for pt to continue IVF through [facility]---order given to Coretta at [facility].

## 2012-10-14 ENCOUNTER — Telehealth: Payer: Self-pay | Admitting: *Deleted

## 2012-10-14 NOTE — Telephone Encounter (Signed)
Message copied by Avie Echevaria on Mon Oct 14, 2012  2:24 PM ------      Message from: Seth Bake      Created: Mon Oct 14, 2012  2:02 PM      Contact: Patient       Had his EEG on July 10 .  Still waiting on results.  Please try to call today.   ------

## 2012-10-17 ENCOUNTER — Telehealth: Payer: Self-pay | Admitting: Neurology

## 2012-10-17 NOTE — Telephone Encounter (Signed)
See previous note

## 2012-10-17 NOTE — Telephone Encounter (Signed)
Wife calling and is asking about EEG, since they are due to go out of town.   EEG showed not seizures, but slowing.  Consulted Dr. Pearlean Brownie about EEG and out of town and pt doing well with no side effects with keppra 500mg  po bid.  Dr. Pearlean Brownie said keep 3 mo f/u RV.  Wife informed.

## 2012-10-22 ENCOUNTER — Telehealth: Payer: Self-pay | Admitting: *Deleted

## 2012-10-22 NOTE — Telephone Encounter (Signed)
[facility] sent facsimile confirmation of capecitabine prescription shipment.  Capecitabine was shipped on 10-21-2012 with next business day delivery.

## 2012-10-23 ENCOUNTER — Other Ambulatory Visit: Payer: Self-pay | Admitting: Oncology

## 2012-10-24 ENCOUNTER — Ambulatory Visit (HOSPITAL_BASED_OUTPATIENT_CLINIC_OR_DEPARTMENT_OTHER): Payer: Medicare Other | Admitting: Nurse Practitioner

## 2012-10-24 ENCOUNTER — Telehealth: Payer: Self-pay | Admitting: Oncology

## 2012-10-24 ENCOUNTER — Ambulatory Visit (HOSPITAL_BASED_OUTPATIENT_CLINIC_OR_DEPARTMENT_OTHER): Payer: Medicare Other

## 2012-10-24 ENCOUNTER — Other Ambulatory Visit (HOSPITAL_BASED_OUTPATIENT_CLINIC_OR_DEPARTMENT_OTHER): Payer: Medicare Other | Admitting: Lab

## 2012-10-24 VITALS — BP 133/86 | HR 76 | Temp 97.4°F | Resp 18 | Ht 73.0 in | Wt 190.0 lb

## 2012-10-24 DIAGNOSIS — E860 Dehydration: Secondary | ICD-10-CM

## 2012-10-24 DIAGNOSIS — C184 Malignant neoplasm of transverse colon: Secondary | ICD-10-CM

## 2012-10-24 DIAGNOSIS — C169 Malignant neoplasm of stomach, unspecified: Secondary | ICD-10-CM

## 2012-10-24 DIAGNOSIS — Z5111 Encounter for antineoplastic chemotherapy: Secondary | ICD-10-CM

## 2012-10-24 LAB — CBC WITH DIFFERENTIAL/PLATELET
Basophils Absolute: 0 10*3/uL (ref 0.0–0.1)
EOS%: 2.4 % (ref 0.0–7.0)
MCH: 38.3 pg — ABNORMAL HIGH (ref 27.2–33.4)
MCV: 111 fL — ABNORMAL HIGH (ref 79.3–98.0)
MONO%: 17.9 % — ABNORMAL HIGH (ref 0.0–14.0)
RBC: 2.9 10*6/uL — ABNORMAL LOW (ref 4.20–5.82)
RDW: 20.9 % — ABNORMAL HIGH (ref 11.0–14.6)
nRBC: 0 % (ref 0–0)

## 2012-10-24 LAB — COMPREHENSIVE METABOLIC PANEL (CC13)
Alkaline Phosphatase: 114 U/L (ref 40–150)
Glucose: 102 mg/dl (ref 70–140)
Sodium: 138 mEq/L (ref 136–145)
Total Bilirubin: 1.01 mg/dL (ref 0.20–1.20)
Total Protein: 6 g/dL — ABNORMAL LOW (ref 6.4–8.3)

## 2012-10-24 MED ORDER — SODIUM CHLORIDE 0.9 % IJ SOLN
10.0000 mL | INTRAMUSCULAR | Status: DC | PRN
  Administered 2012-10-24: 10 mL
  Filled 2012-10-24: qty 10

## 2012-10-24 MED ORDER — DEXTROSE 5 % IV SOLN
Freq: Once | INTRAVENOUS | Status: AC
  Administered 2012-10-24: 10:00:00 via INTRAVENOUS

## 2012-10-24 MED ORDER — ONDANSETRON 8 MG/50ML IVPB (CHCC)
8.0000 mg | Freq: Once | INTRAVENOUS | Status: AC
  Administered 2012-10-24: 8 mg via INTRAVENOUS

## 2012-10-24 MED ORDER — OXALIPLATIN CHEMO INJECTION 100 MG/20ML
85.0000 mg/m2 | Freq: Once | INTRAVENOUS | Status: AC
  Administered 2012-10-24: 170 mg via INTRAVENOUS
  Filled 2012-10-24: qty 34

## 2012-10-24 MED ORDER — DEXAMETHASONE SODIUM PHOSPHATE 10 MG/ML IJ SOLN
10.0000 mg | Freq: Once | INTRAMUSCULAR | Status: AC
  Administered 2012-10-24: 10 mg via INTRAVENOUS

## 2012-10-24 MED ORDER — HEPARIN SOD (PORK) LOCK FLUSH 100 UNIT/ML IV SOLN
250.0000 [IU] | Freq: Once | INTRAVENOUS | Status: AC | PRN
  Administered 2012-10-24: 250 [IU]
  Filled 2012-10-24: qty 5

## 2012-10-24 NOTE — Patient Instructions (Addendum)
[facility] Discharge Instructions for Patients Receiving Chemotherapy  Today you received the following chemotherapy agents Oxaliplatin.  To help prevent nausea and vomiting after your treatment, we encourage you to take your nausea medication.   If you develop nausea and vomiting that is not controlled by your nausea medication, call the clinic.   BELOW ARE SYMPTOMS THAT SHOULD BE REPORTED IMMEDIATELY:  *FEVER GREATER THAN 100.5 F  *CHILLS WITH OR WITHOUT FEVER  NAUSEA AND VOMITING THAT IS NOT CONTROLLED WITH YOUR NAUSEA MEDICATION  *UNUSUAL SHORTNESS OF BREATH  *UNUSUAL BRUISING OR BLEEDING  TENDERNESS IN MOUTH AND THROAT WITH OR WITHOUT PRESENCE OF ULCERS  *URINARY PROBLEMS  *BOWEL PROBLEMS  UNUSUAL RASH Items with * indicate a potential emergency and should be followed up as soon as possible.  Feel free to call the clinic you have any questions or concerns. The clinic phone number is [phone].    

## 2012-10-24 NOTE — Progress Notes (Addendum)
OFFICE PROGRESS NOTE  Interval history:  [name] [name] returns as scheduled. He completed cycle 5 CAPOX beginning 10/03/2012. He denies nausea/vomiting. No mouth sores. No diarrhea. No hand or foot pain or redness. Cold sensitivity lasted approximately 7 days. He intermittently notes numbness in the fingertips. He continues to have "heartburn" intermittently. He is currently taking Prilosec 20 mg twice a day. He takes Mylanta as needed. Ostomy output is stable. The output becomes thicker if he eats bananas. He remains active.   Objective: Blood pressure 133/86, pulse 76, temperature 97.4 F (36.3 C), temperature source Oral, resp. rate 18, height 6\' 1"  (1.854 m), weight 190 lb (86.183 kg).  No thrush or ulcerations. Lungs clear. Regular cardiac rhythm. Abdomen soft and nontender. No hepatomegaly. Right side ostomy. Trace lower leg/ankle edema bilaterally. Vibratory sense mildly decreased over the fingertips on the left hand and normal over the fingertips on the right hand. Left facial droop. Alert and oriented. PICC site is without erythema.  Lab Results: Lab Results  Component Value Date   WBC 5.8 10/24/2012   HGB 11.1* 10/24/2012   HCT 32.2* 10/24/2012   MCV 111.0* 10/24/2012   PLT 182 10/24/2012    Chemistry:    Chemistry      Component Value Date/Time   NA 139 10/03/2012 0941   NA 138 09/16/2012 1354   NA 139 09/16/2012 1354   K 4.2 10/03/2012 0941   K 3.8 09/16/2012 1354   K 3.8 09/16/2012 1354   CL 105 09/16/2012 1354   CL 105 09/16/2012 1354   CL 108* 08/23/2012 0823   CO2 23 10/03/2012 0941   CO2 24 09/16/2012 1354   BUN 11.2 10/03/2012 0941   BUN 16 09/16/2012 1354   BUN 16 09/16/2012 1354   CREATININE 1.0 10/03/2012 0941   CREATININE 1.13 09/16/2012 1354   CREATININE 1.20 09/16/2012 1354      Component Value Date/Time   CALCIUM 9.5 10/03/2012 0941   CALCIUM 9.3 09/16/2012 1354   ALKPHOS 114 10/03/2012 0941   ALKPHOS 102 09/16/2012 1354   AST 29 10/03/2012 0941   AST 27 09/16/2012 1354   ALT  27 10/03/2012 0941   ALT 23 09/16/2012 1354   BILITOT 0.55 10/03/2012 0941   BILITOT 0.8 09/16/2012 1354       Studies/Results: No results found.  Medications: I have reviewed the patient's current medications.  Assessment/Plan:  1.Metastatic gastric cancer-adenocarcinoma documented on the biopsy of the stomach and transverse colon. Exploratory laparoscopy 06/13/2012 with no tumor seen, positive cytology for metastatic adenocarcinoma. Biopsy of a left scalene nodal 06/13/2012 confirmed metastatic adenocarcinoma. Received first cycle CAPOX on 07/11/2012. He started Xeloda but held it after 2 days due to hypotension, diarrhea and dehydration. The Xeloda was resumed 07/22/2012. He received cycle 2 CAPOX beginning 08/01/2012, cycle 3 on 08/23/2012 , cycle 4 on 09/12/2012, cycle 5 10/03/2012. 2. Partial colonic obstruction secondary to tumor invading the transverse colon-status post a palliative diverting distal ileostomy 06/10/2012.  3. History of atrial fibrillation-maintained on xarelto.  4. Anorexia/weight loss secondary to metastatic gastric cancer and the colonic obstruction. His appetite has improved and he has gained weight  5. Dehydration - due to copious output from ileostomy, poor oral intake and diarrhea. Not improved with Imodium and Metamucil. The diarrhea improved when he began paregoric. He is now taking 1 L of IV fluids daily via a PICC. He is no longer taking paregoric. He notes the ileostomy output is "thicker" after beginning eating bananas.  6. Subxiphoid episodic  pain on 07/16/2012 -resolved.  7. Admission with transient dysarthria 07/24/2012-no acute change on CNS imaging, he reports recurrent episodes of dysarthria since discharge from the hospital. Episode of transient left arm weakness during the week of 09/02/2012-spontaneously resolved. He had another episode of expressive aphasia on 09/17/2012. He was placed on Keppra by Dr. Pearlean Brownie  8. Moderate loss of vibratory sense at the  fingers bilaterally-? Oxaliplatin neuropathy. Vibratory sense is mildly decreased today.  Disposition-[name] [name] appears stable. He has completed 5 cycles of CAPOX chemotherapy. Plan to proceed with cycle 6 today as scheduled.  Dr. Truett Perna reviewed the most recent CT scan with a radiologist. There is no measurable disease on CT. Dr. Truett Perna recommends a referral to Dr. Matthias Hughs for a restaging upper endoscopy following cycle 6 of CAPOX.  [name] [name] is currently receiving IV fluids at home. He will hold IV fluids for the next one week and try to maintain hydration orally. If successful we will have the PICC line removed. He will contact the office in one week with an update.  He will return for a followup visit in 4 weeks. He will contact the office in the interim as outlined above or with any other problems.  Patient seen with Dr. Truett Perna.   Lonna Cobb ANP/GNP-BC   He appears stable. He will complete a sixth cycle of CAPOX beginning today. [name] [name] will discontinue the home IV fluids. If he is able to maintain his hydration the patient will be removed next week.  There is no "measurable "disease on the pretreatment CT. We will ask Dr. Matthias Hughs to consider performing a repeat upper endoscopy to look for evidence of improvement in the gastric wall edema.  [name] [name] will return for an office visit in one month.

## 2012-10-24 NOTE — Telephone Encounter (Signed)
gv pt appt schedule for September and pt/relative aware he will be contacted re appt @ [facility] with Dr. Matthias Hughs as he was originally seen by Dr. Madilyn Fireman and both doctors will need to approve transfer.Spoke with [facility] GI and per Corrie Dandy because pt was originally seen by Dr. Madilyn Fireman she would need to send a message to both Dr. Madilyn Fireman and Dr. Matthias Hughs to get approval for pt to be transferred to Dr. Matthias Hughs. Per Corrie Dandy she will call me back when she has an answer. Pt/relative aware. Also per pt/relative he is not having tx 8/21 - no more tx until he has procedure w/Dr. Matthias Hughs. Message to [facility] to confirm.

## 2012-10-25 ENCOUNTER — Telehealth: Payer: Self-pay | Admitting: Oncology

## 2012-10-25 NOTE — Telephone Encounter (Signed)
Velna Hatchet Dr. Buccini's assistant called back and per Velna Hatchet Dr. Matthias Hughs will be out of office until 8/12 and she will check with him then to see if he will see pt. Also per Velna Hatchet if Dr. Matthias Hughs agrees he will not be able to see pt until week of 9/9 and if pt needs to be seen sooner he can always stay w/Dr. Madilyn Fireman. Message to [facility] to address and copied to Thurston Hole and Okey Dupre as I will be out of office.

## 2012-10-28 ENCOUNTER — Telehealth: Payer: Self-pay | Admitting: *Deleted

## 2012-10-28 NOTE — Telephone Encounter (Signed)
Asking about status of GI referral to Dr. Matthias Hughs: Made her aware he is out of office until 8/12-gave her phone # to follow up since referral has been made from our office. She also reports patient has had more nausea, pain and fatigue. Asking if OK to resume his home IV hydration since he still has PICC line? Still has supplies in home to administer. Instructed her to continue IV fluids as in past. She will call again Friday with update. She reports his PICC line site is wnl. Agreed that now is not best time to remove the PICC line.

## 2012-10-29 ENCOUNTER — Telehealth: Payer: Self-pay | Admitting: Oncology

## 2012-10-29 NOTE — Telephone Encounter (Signed)
Dr. Buccini's office caled and pt has appt with GI tomorrow 8/13, pt aware

## 2012-10-30 ENCOUNTER — Telehealth: Payer: Self-pay | Admitting: Neurology

## 2012-10-30 ENCOUNTER — Emergency Department (HOSPITAL_COMMUNITY)
Admission: EM | Admit: 2012-10-30 | Discharge: 2012-10-30 | Disposition: A | Discharge status: Home / Self Care | Payer: Medicare Other | Attending: Emergency Medicine | Admitting: Emergency Medicine

## 2012-10-30 ENCOUNTER — Encounter (HOSPITAL_COMMUNITY): Payer: Self-pay | Admitting: Emergency Medicine

## 2012-10-30 ENCOUNTER — Telehealth: Payer: Self-pay | Admitting: *Deleted

## 2012-10-30 DIAGNOSIS — C7889 Secondary malignant neoplasm of other digestive organs: Secondary | ICD-10-CM | POA: Insufficient documentation

## 2012-10-30 DIAGNOSIS — Z932 Ileostomy status: Secondary | ICD-10-CM | POA: Insufficient documentation

## 2012-10-30 DIAGNOSIS — M1990 Unspecified osteoarthritis, unspecified site: Secondary | ICD-10-CM | POA: Insufficient documentation

## 2012-10-30 DIAGNOSIS — R4789 Other speech disturbances: Secondary | ICD-10-CM | POA: Insufficient documentation

## 2012-10-30 DIAGNOSIS — Z79899 Other long term (current) drug therapy: Secondary | ICD-10-CM | POA: Insufficient documentation

## 2012-10-30 DIAGNOSIS — Z7901 Long term (current) use of anticoagulants: Secondary | ICD-10-CM | POA: Insufficient documentation

## 2012-10-30 DIAGNOSIS — C169 Malignant neoplasm of stomach, unspecified: Secondary | ICD-10-CM | POA: Insufficient documentation

## 2012-10-30 DIAGNOSIS — G459 Transient cerebral ischemic attack, unspecified: Secondary | ICD-10-CM | POA: Insufficient documentation

## 2012-10-30 DIAGNOSIS — I4891 Unspecified atrial fibrillation: Secondary | ICD-10-CM | POA: Insufficient documentation

## 2012-10-30 DIAGNOSIS — Z87891 Personal history of nicotine dependence: Secondary | ICD-10-CM | POA: Insufficient documentation

## 2012-10-30 DIAGNOSIS — I10 Essential (primary) hypertension: Secondary | ICD-10-CM | POA: Insufficient documentation

## 2012-10-30 DIAGNOSIS — F411 Generalized anxiety disorder: Secondary | ICD-10-CM | POA: Insufficient documentation

## 2012-10-30 DIAGNOSIS — F29 Unspecified psychosis not due to a substance or known physiological condition: Secondary | ICD-10-CM | POA: Insufficient documentation

## 2012-10-30 DIAGNOSIS — Z8619 Personal history of other infectious and parasitic diseases: Secondary | ICD-10-CM | POA: Insufficient documentation

## 2012-10-30 DIAGNOSIS — Z8669 Personal history of other diseases of the nervous system and sense organs: Secondary | ICD-10-CM | POA: Insufficient documentation

## 2012-10-30 DIAGNOSIS — Z880 Allergy status to penicillin: Secondary | ICD-10-CM | POA: Insufficient documentation

## 2012-10-30 DIAGNOSIS — Z8679 Personal history of other diseases of the circulatory system: Secondary | ICD-10-CM | POA: Insufficient documentation

## 2012-10-30 DIAGNOSIS — Z85038 Personal history of other malignant neoplasm of large intestine: Secondary | ICD-10-CM | POA: Insufficient documentation

## 2012-10-30 HISTORY — DX: Malignant (primary) neoplasm, unspecified: C80.1

## 2012-10-30 LAB — BASIC METABOLIC PANEL
BUN: 20 mg/dL (ref 6–23)
CO2: 20 mEq/L (ref 19–32)
Calcium: 9.3 mg/dL (ref 8.4–10.5)
Creatinine, Ser: 1.11 mg/dL (ref 0.50–1.35)
Glucose, Bld: 159 mg/dL — ABNORMAL HIGH (ref 70–99)

## 2012-10-30 LAB — URINALYSIS, ROUTINE W REFLEX MICROSCOPIC
Bilirubin Urine: NEGATIVE
Ketones, ur: NEGATIVE mg/dL
Nitrite: NEGATIVE
pH: 6 (ref 5.0–8.0)

## 2012-10-30 LAB — CBC WITH DIFFERENTIAL/PLATELET
Basophils Absolute: 0 10*3/uL (ref 0.0–0.1)
Eosinophils Relative: 0 % (ref 0–5)
Lymphs Abs: 0.9 10*3/uL (ref 0.7–4.0)
MCH: 38.8 pg — ABNORMAL HIGH (ref 26.0–34.0)
MCHC: 35.1 g/dL (ref 30.0–36.0)
MCV: 110.5 fL — ABNORMAL HIGH (ref 78.0–100.0)
Monocytes Absolute: 1.1 10*3/uL — ABNORMAL HIGH (ref 0.1–1.0)
Platelets: 149 10*3/uL — ABNORMAL LOW (ref 150–400)
RDW: 18.9 % — ABNORMAL HIGH (ref 11.5–15.5)

## 2012-10-30 LAB — URINE MICROSCOPIC-ADD ON

## 2012-10-30 MED ORDER — HEPARIN SOD (PORK) LOCK FLUSH 100 UNIT/ML IV SOLN
INTRAVENOUS | Status: AC
  Administered 2012-10-30: 500 [IU]
  Filled 2012-10-30: qty 5

## 2012-10-30 NOTE — ED Notes (Signed)
Bed: WA09 Expected date:  Expected time:  Means of arrival:  Comments: 79 y/o M CA pt, weakness

## 2012-10-30 NOTE — ED Provider Notes (Addendum)
CSN: [id number]     Arrival date & time 10/30/12  0809 History     First MD Initiated Contact with Patient 10/30/12 0813     Chief Complaint  Patient presents with  . Weakness   (Consider location/radiation/quality/duration/timing/severity/associated sxs/prior Treatment) HPI .... episode of confusion, disorientation, difficulty speaking, left arm weakness this morning which lasted approximately less than 1 hour.  Symptoms have now cleared. Patient is currently taking chemotherapy for stomach cancer stage IV diagnosed in March 2014. He has had spells like this in the past. Nothing makes symptoms better or worse. Severity is moderate.   Past Medical History  Diagnosis Date  . Ramsay Hunt auricular syndrome   . HTN (hypertension)   . Osteoarthritis   . Dysrhythmia 04/17/12  . Anxiety   . Stroke   . H/O Bell's palsy at age 54  . Atrial flutter   . Atrial fibrillation   . Ileostomy in place 06/14/2012  . Cancer     stomach and colon   Past Surgical History  Procedure Laterality Date  . Hernia repair    . Vasectomy    . Prostate biopsies      per Vonita Moss  . Cataract extraction      SE eye  . Colonoscopy N/A 06/03/2012    Procedure: COLONOSCOPY;  Surgeon: Florencia Reasons, MD;  Location[facility] ENDOSCOPY;  Service: Endoscopy;  Laterality: N/A;  . Esophagogastroduodenoscopy N/A 06/03/2012    Procedure: ESOPHAGOGASTRODUODENOSCOPY (EGD);  Surgeon: Florencia Reasons, MD;  Location: [facility] ENDOSCOPY;  Service: Endoscopy;  Laterality: N/A;  . Laparoscopy N/A 06/13/2012    Procedure: EXPLORATORY LAPAROSCOPY ;  Surgeon: Liz Malady, MD;  Location: [facility] OR;  Service: General;  Laterality: N/A;  . Diverting ileostomy N/A 06/13/2012    Procedure: DIVERTING LOOP ILEOSTOMY;  Surgeon: Liz Malady, MD;  Location: [facility] OR;  Service: General;  Laterality: N/A;  . Lymph node biopsy Left 06/13/2012    Procedure: LYMPH NODE BIOPSY;  Surgeon: Liz Malady, MD;  Location: [facility] OR;  Service: General;   Laterality: Left;  Scalene Lymph Node Biopsy   Family History  Problem Relation Age of Onset  . Cancer Father   . Diabetes Father    History  Substance Use Topics  . Smoking status: Former Smoker -- 0.50 packs/day for 30 years    Types: Cigarettes    Quit date: 06/22/1984  . Smokeless tobacco: Never Used  . Alcohol Use: 0.0 oz/week     Comment: 1 glass of wine a day; quit in 05/2012    Review of Systems  All other systems reviewed and are negative.    Allergies  Penicillins  Home Medications   Current Outpatient Rx  Name  Route  Sig  Dispense  Refill  . capecitabine (XELODA) 500 MG tablet   Oral   Take 1,500 mg by mouth 2 (two) times daily after a meal. For 14 days, 09/16/12 is day 4         . Dextrose-Sodium Chloride (DEXTROSE 5 % AND 0.9% NACL) 5-0.9 %   Intravenous   Inject 1,000 mLs into the vein daily.         Marland Kitchen diltiazem (CARDIZEM CD) 120 MG 24 hr capsule   Oral   Take 1 capsule (120 mg total) by mouth every morning. For atrial fibrillation   30 capsule   1   . ferrous sulfate 325 (65 FE) MG tablet   Oral   Take 1 tablet (325 mg total) by  mouth 2 (two) times daily with a meal.   60 tablet   1   . HYDROcodone-acetaminophen (NORCO/VICODIN) 5-325 MG per tablet   Oral   Take 1 tablet by mouth daily as needed for pain.          Marland Kitchen levETIRAcetam (KEPPRA) 500 MG tablet      0.5 tablets( 250mg ) twice daily x 2 weeks then 1 tablet ( 500mg ) twice daily   60 tablet   11   . megestrol (MEGACE ORAL) 40 MG/ML suspension   Oral   Take 200 mg by mouth 2 (two) times daily as needed (for appetite).    300 mL   2   . Multiple Vitamins-Minerals (PRESERVISION AREDS PO)   Oral   Take 1 tablet by mouth 2 (two) times daily.         Marland Kitchen omeprazole (PRILOSEC) 20 MG capsule   Oral   Take 20 mg by mouth 2 (two) times daily.          . ondansetron (ZOFRAN) 4 MG tablet   Oral   Take 4 mg by mouth every 8 (eight) hours as needed for nausea.         .  prochlorperazine (COMPAZINE) 10 MG tablet               . Rivaroxaban (XARELTO) 20 MG TABS   Oral   Take 20 mg by mouth every evening.          BP 108/66  Pulse 100  Temp(Src) 97.3 F (36.3 C) (Oral)  Resp 20  SpO2 87% Physical Exam  Nursing note and vitals reviewed. Constitutional: He is oriented to person, place, and time.  Speech is understandable and fluids   HENT:  Head: Normocephalic and atraumatic.  Eyes: Conjunctivae and EOM are normal. Pupils are equal, round, and reactive to light.  Neck: Normal range of motion. Neck supple.  Cardiovascular: Normal rate, regular rhythm and normal heart sounds.   Pulmonary/Chest: Effort normal and breath sounds normal.  Abdominal: Soft. Bowel sounds are normal.  Musculoskeletal: Normal range of motion.  Neurological: He is alert and oriented to person, place, and time.  Full range of motion of arms and legs  Skin: Skin is warm and dry.  Psychiatric: He has a normal mood and affect.    ED Course   Procedures (including critical care time)  Labs Reviewed  BASIC METABOLIC PANEL - Abnormal; Notable for the following:    Sodium 132 (*)    Glucose, Bld 159 (*)    GFR calc non Af Amer 61 (*)    GFR calc Af Amer 71 (*)    All other components within normal limits  CBC WITH DIFFERENTIAL - Abnormal; Notable for the following:    RBC 2.76 (*)    Hemoglobin 10.7 (*)    HCT 30.5 (*)    MCV 110.5 (*)    MCH 38.8 (*)    RDW 18.9 (*)    Platelets 149 (*)    Monocytes Relative 25 (*)    Monocytes Absolute 1.1 (*)    All other components within normal limits  URINALYSIS, ROUTINE W REFLEX MICROSCOPIC - Abnormal; Notable for the following:    Hgb urine dipstick SMALL (*)    All other components within normal limits  URINE MICROSCOPIC-ADD ON   No results found. No diagnosis found.     Date: 10/30/2012  Rate:101  Rhythm: sinus tachy  QRS Axis: normal  Intervals: normal  ST/T Wave  abnormalities: normal  Conduction  Disutrbances: none  Narrative Interpretation: unremarkable    MDM  I suspect patient had an episode of transient ischemia attack.  This was discussed with the patient and his wife.  Patient has decided not to obtain any further testing. They have a primary care and oncological followup.  Donnetta Hutching, MD 10/30/12 1115  Donnetta Hutching, MD 11/01/12 [phone]

## 2012-10-30 NOTE — ED Notes (Signed)
Per EMS:pt c/o weakness since this morning. It taking chemo. Pt states for about 5 mins he was unable to respond to wife but was alert of situation. PICC assessed.

## 2012-10-30 NOTE — Telephone Encounter (Signed)
Pt's wife called stating pt in ED possible TIA; "should he take his Xeloda today or hold it?"  Per Dr. Truett Perna instructed pt to hold Xeloda today and call office tomorrow with pt update.  Pt's wife verbalized understanding and stated "this cycle of Xeloda has really knocked him down this time"

## 2012-10-31 ENCOUNTER — Telehealth: Payer: Self-pay | Admitting: *Deleted

## 2012-10-31 ENCOUNTER — Other Ambulatory Visit: Payer: Self-pay | Admitting: *Deleted

## 2012-10-31 DIAGNOSIS — C169 Malignant neoplasm of stomach, unspecified: Secondary | ICD-10-CM

## 2012-10-31 NOTE — Telephone Encounter (Signed)
Reports he is back to his baseline-talking, walking. Respirations may be slightly more labored today, but not in any distress. Asking if he should resume his Xeloda today? Per Dr. Truett Perna : OK to resume Xeloda. Instructed her to have the RN changing his PICC line dressing tomorrow check his oxygen sat at rest and with activity and report readings. Call for any fever. Still getting IV fluids at home. Asking about the labs Dr. Matthias Hughs wanted on 11/05/12. POF sent to scheduler for this.

## 2012-11-01 ENCOUNTER — Ambulatory Visit (HOSPITAL_BASED_OUTPATIENT_CLINIC_OR_DEPARTMENT_OTHER): Payer: Medicare Other | Admitting: Oncology

## 2012-11-01 ENCOUNTER — Ambulatory Visit (HOSPITAL_COMMUNITY)
Admission: RE | Admit: 2012-11-01 | Discharge: 2012-11-01 | Disposition: A | Discharge status: Home / Self Care | Payer: Medicare Other | Source: Ambulatory Visit | Attending: Oncology | Admitting: Oncology

## 2012-11-01 ENCOUNTER — Telehealth: Payer: Self-pay | Admitting: *Deleted

## 2012-11-01 ENCOUNTER — Telehealth: Payer: Self-pay | Admitting: Oncology

## 2012-11-01 VITALS — BP 113/79 | HR 91 | Temp 97.5°F | Resp 18 | Ht 73.0 in | Wt 190.7 lb

## 2012-11-01 DIAGNOSIS — C169 Malignant neoplasm of stomach, unspecified: Secondary | ICD-10-CM | POA: Insufficient documentation

## 2012-11-01 DIAGNOSIS — C770 Secondary and unspecified malignant neoplasm of lymph nodes of head, face and neck: Secondary | ICD-10-CM

## 2012-11-01 DIAGNOSIS — C184 Malignant neoplasm of transverse colon: Secondary | ICD-10-CM

## 2012-11-01 DIAGNOSIS — K3189 Other diseases of stomach and duodenum: Secondary | ICD-10-CM

## 2012-11-01 DIAGNOSIS — R109 Unspecified abdominal pain: Secondary | ICD-10-CM | POA: Insufficient documentation

## 2012-11-01 NOTE — Telephone Encounter (Signed)
lvm for pt regarding to appts.[name] [name]

## 2012-11-01 NOTE — Telephone Encounter (Signed)
Pt's wife called to say that patient's abdomen is very distended and hard from ribs to waist. No blood in ileostomy. Not a lot of output, but states pt is not taking in a lot. Does not believe pt has a fever. Discussed with Dr Truett Perna, pt will come over to [facility] to be seen today.

## 2012-11-02 NOTE — Progress Notes (Signed)
[facility]    OFFICE PROGRESS NOTE   INTERVAL HISTORY:   He returns for an unscheduled visit. He completed cycle 6 of CAPOX beginning on 10/24/2012. Mr. Trembath has noted increased malaise following this cycle. The plan was to discontinue home IV fluids, but these were resumed when he developed malaise.  He noted the onset of abdominal swelling earlier today. No nausea or vomiting. He emptied the ostomy bag this morning. Minimal ostomy output since this morning. He has noted back pain today.  He was seen in the emergency room on 10/30/2012 when he developed spontaneous  aphasia and left arm weakness. The symptoms resolved over approximately 1 hour.      Objective:  Vital signs in last 24 hours:  Blood pressure 113/79, pulse 91, temperature 97.5 F (36.4 C), temperature source Oral, resp. rate 18, height 6\' 1"  (1.854 m), weight 190 lb 11.2 oz (86.501 kg).    HEENT: the mucous membranes are moist Resp: lungs clear bilaterally Cardio: regular rate and rhythm GI: distended, no apparent stool in the ostomy bag, nontender, no mass, hypoactive bowel sounds Vascular: no leg edema Neuro:alert and oriented, fluent speech    Portacath/PICC-without erythema  Lab Results:  Lab Results  Component Value Date   WBC 4.3 10/30/2012   HGB 10.7* 10/30/2012   HCT 30.5* 10/30/2012   MCV 110.5* 10/30/2012   PLT 149* 10/30/2012    X-rays: acute abdomen series 11/01/2012-no acute findings, no evidence of bowel obstruction  Medications: I have reviewed the patient's current medications.  Assessment/Plan: 1.Metastatic gastric cancer-adenocarcinoma documented on the biopsy of the stomach and transverse colon. Exploratory laparoscopy 06/13/2012 with no tumor seen, positive cytology for metastatic adenocarcinoma. Biopsy of a left scalene nodal 06/13/2012 confirmed metastatic adenocarcinoma. Received first cycle CAPOX on 07/11/2012. He started Xeloda but held it after 2 days due to  hypotension, diarrhea and dehydration. The Xeloda was resumed 07/22/2012. He received cycle 2 CAPOX beginning 08/01/2012, cycle 3 on 08/23/2012 , cycle 4 on 09/12/2012, cycle 5 10/03/2012,cycle 6 on 10/24/2012  2. Partial colonic obstruction secondary to tumor invading the transverse colon-status post a palliative diverting distal ileostomy 06/10/2012.  3. History of atrial fibrillation-maintained on xarelto.  4. Anorexia/weight loss secondary to metastatic gastric cancer and the colonic obstruction. His appetite has improved and he has gained weight  5. Dehydration - due to copious output from ileostomy, poor oral intake and diarrhea. Not improved with Imodium and Metamucil. The diarrhea improved when he began paregoric. He is now taking 1 L of IV fluids daily via a PICC. He is no longer taking paregoric. He notes the ileostomy output is "thicker" after beginning eating bananas.  6. Subxiphoid episodic pain on 07/16/2012 -resolved.  7. Admission with transient dysarthria 07/24/2012-no acute change on CNS imaging, he reports recurrent episodes of dysarthria since discharge from the hospital. Episode of transient left arm weakness during the week of 09/02/2012-spontaneously resolved. He had another episode of expressive aphasia on 09/17/2012. He was placed on Keppra by Dr. Pearlean Brownie , aphasia and left arm weakness 10/30/2012 8. Moderate loss of vibratory sense at the fingers bilaterally-? Oxaliplatin neuropathy. Vibratory sense is mildly decreased today. 9.abdominal distention 11/01/2012    Disposition:  He presents today with new onset abdominal distention. I am concerned he is developing a bowel obstruction, but the plain x-ray does not reveal this. The distention could also be related to ascites, but the rapid development argues against this.  I reviewed the plain x-rays and discussed the  differential diagnosis with Mr. Nienhuis. He will follow a liquid diet and continued IV fluids over the weekend. He  will contact us early next week with a report on his condition. If the distention has not improved  we will refer him for a CT.  There is no measurable disease on the initial staging CTs. Dr. Matthias Hughs plans a repeat upper endoscopy to look for evidence of persistent gastric cancer.   Thornton Papas, MD  11/02/2012  10:08 AM

## 2012-11-05 ENCOUNTER — Other Ambulatory Visit (HOSPITAL_BASED_OUTPATIENT_CLINIC_OR_DEPARTMENT_OTHER): Payer: Medicare Other

## 2012-11-05 DIAGNOSIS — C785 Secondary malignant neoplasm of large intestine and rectum: Secondary | ICD-10-CM

## 2012-11-05 DIAGNOSIS — C169 Malignant neoplasm of stomach, unspecified: Secondary | ICD-10-CM

## 2012-11-05 LAB — CBC WITH DIFFERENTIAL/PLATELET
Basophils Absolute: 0 10*3/uL (ref 0.0–0.1)
Eosinophils Absolute: 0 10*3/uL (ref 0.0–0.5)
HGB: 10.8 g/dL — ABNORMAL LOW (ref 13.0–17.1)
MCV: 118.6 fL — ABNORMAL HIGH (ref 79.3–98.0)
MONO%: 16.1 % — ABNORMAL HIGH (ref 0.0–14.0)
NEUT#: 3.5 10*3/uL (ref 1.5–6.5)
RDW: 21.1 % — ABNORMAL HIGH (ref 11.0–14.6)
lymph#: 1.2 10*3/uL (ref 0.9–3.3)

## 2012-11-06 ENCOUNTER — Telehealth: Payer: Self-pay | Admitting: *Deleted

## 2012-11-06 ENCOUNTER — Telehealth: Payer: Self-pay | Admitting: Oncology

## 2012-11-06 NOTE — Telephone Encounter (Signed)
Spoke with pt's wife. Pt had liquid diet over the weekend, did not make much of a difference in bloating. Scheduled for endoscopy 11/07/12. Mrs. Temme stated she will call office if any issues arise prior to next visit.  Received refill request from [facility], will hold at RN desk until pt has had endoscopy.

## 2012-11-06 NOTE — Telephone Encounter (Signed)
[facility] faxed Capcitabine refill request.  Request to provider for review.

## 2012-11-06 NOTE — Telephone Encounter (Signed)
Per 8/13 staff message from [facility] pt was seen by Dr. Matthias Hughs that day (8/13).

## 2012-11-07 ENCOUNTER — Ambulatory Visit (HOSPITAL_COMMUNITY)
Admission: RE | Admit: 2012-11-07 | Discharge: 2012-11-07 | Disposition: A | Discharge status: Home / Self Care | Payer: Medicare Other | Source: Ambulatory Visit | Attending: Gastroenterology | Admitting: Gastroenterology

## 2012-11-07 ENCOUNTER — Encounter (HOSPITAL_COMMUNITY)
Admission: RE | Disposition: A | Discharge status: Home / Self Care | Payer: Self-pay | Source: Ambulatory Visit | Attending: Gastroenterology

## 2012-11-07 ENCOUNTER — Encounter (HOSPITAL_COMMUNITY): Payer: Self-pay | Admitting: *Deleted

## 2012-11-07 DIAGNOSIS — Z7901 Long term (current) use of anticoagulants: Secondary | ICD-10-CM | POA: Insufficient documentation

## 2012-11-07 DIAGNOSIS — Z9049 Acquired absence of other specified parts of digestive tract: Secondary | ICD-10-CM | POA: Insufficient documentation

## 2012-11-07 DIAGNOSIS — C169 Malignant neoplasm of stomach, unspecified: Secondary | ICD-10-CM | POA: Insufficient documentation

## 2012-11-07 DIAGNOSIS — K449 Diaphragmatic hernia without obstruction or gangrene: Secondary | ICD-10-CM | POA: Insufficient documentation

## 2012-11-07 DIAGNOSIS — I10 Essential (primary) hypertension: Secondary | ICD-10-CM | POA: Insufficient documentation

## 2012-11-07 DIAGNOSIS — Z8673 Personal history of transient ischemic attack (TIA), and cerebral infarction without residual deficits: Secondary | ICD-10-CM | POA: Insufficient documentation

## 2012-11-07 DIAGNOSIS — Z880 Allergy status to penicillin: Secondary | ICD-10-CM | POA: Insufficient documentation

## 2012-11-07 DIAGNOSIS — C785 Secondary malignant neoplasm of large intestine and rectum: Secondary | ICD-10-CM | POA: Insufficient documentation

## 2012-11-07 DIAGNOSIS — Z9221 Personal history of antineoplastic chemotherapy: Secondary | ICD-10-CM | POA: Insufficient documentation

## 2012-11-07 DIAGNOSIS — K319 Disease of stomach and duodenum, unspecified: Secondary | ICD-10-CM | POA: Insufficient documentation

## 2012-11-07 HISTORY — PX: ESOPHAGOGASTRODUODENOSCOPY: SHX5428

## 2012-11-07 SURGERY — EGD (ESOPHAGOGASTRODUODENOSCOPY)
Anesthesia: Moderate Sedation

## 2012-11-07 MED ORDER — MIDAZOLAM HCL 5 MG/5ML IJ SOLN
INTRAMUSCULAR | Status: DC | PRN
  Administered 2012-11-07: 1 mg via INTRAVENOUS

## 2012-11-07 MED ORDER — FENTANYL CITRATE 0.05 MG/ML IJ SOLN
INTRAMUSCULAR | Status: AC
  Filled 2012-11-07: qty 2

## 2012-11-07 MED ORDER — MIDAZOLAM HCL 10 MG/2ML IJ SOLN
INTRAMUSCULAR | Status: DC | PRN
  Administered 2012-11-07 (×4): 1 mg via INTRAVENOUS

## 2012-11-07 MED ORDER — HEPARIN SOD (PORK) LOCK FLUSH 100 UNIT/ML IV SOLN
INTRAVENOUS | Status: AC
  Filled 2012-11-07: qty 5

## 2012-11-07 MED ORDER — MIDAZOLAM HCL 10 MG/2ML IJ SOLN
INTRAMUSCULAR | Status: AC
  Filled 2012-11-07: qty 2

## 2012-11-07 MED ORDER — BUTAMBEN-TETRACAINE-BENZOCAINE 2-2-14 % EX AERO
INHALATION_SPRAY | CUTANEOUS | Status: DC | PRN
  Administered 2012-11-07: 2 via TOPICAL

## 2012-11-07 MED ORDER — SODIUM CHLORIDE 0.9 % IV SOLN: INTRAVENOUS | Status: DC

## 2012-11-07 NOTE — H&P (Signed)
  This 79 year old gentleman presents for updated endoscopy and possible colonoscopy, for updated assessment of his metastatic gastric cancer. 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 Since then, he has undergone chemotherapy with a marked improvement in clinical and functional status. His oncologist has asked for updated evaluation to try to assess tumor response.  Past medical history:  Allergies: Penicillin (rash)  Outpatient medications: Cardizem, ferrous sulfate, Xeloda, Prilosec, Vicodin, heparin, Megace, Zofran and Compazine when necessary. Is also on Xarelto but skipped last night dosing our request for the purpose of today's procedure.  Operations: Diverting colostomy earlier this year. Also, history of hernia repair.  Chronic medical illnesses: Gastric cancer diagnosed and treated this year, history of CVA, a history of hypertension  Physical examination:  The patient is alert and coherent and appropriate and in no distress. Anicteric, no frank pallor. Chest clear, heart without murmurs or arrhythmias. Abdomen has a colostomy in place and is somewhat distended, with moderate tympany and slight firmness, no mass effect or tenderness  Impression: Gastric cancer, with good clinical response following chemotherapy  Plan: Endoscopic evaluation today, with biopsies. Consider concurrent unprepped colonoscopy to evaluate the area of malignant infiltration of his colon seen last March, if the endoscopic evaluation is equivocal.  Florencia Reasons, M.D. [phone]

## 2012-11-07 NOTE — Op Note (Signed)
[facility] [address] [place], [address]   ENDOSCOPY PROCEDURE REPORT  PATIENT: [name], [name]  MR#: [id number] BIRTHDATE: 08/13/1930 , 81  yrs. 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 Tylenol was performed and he receives the above sedation after arriving written consent. He remained clinically stable throughout the procedure.  The Pentax adult video endoscope was passed under direct vision. The vocal cords looked normal. The esophagus was entered. It appeared to have some spasm-like contractions but the mucosa was normal, without evidence of reflux esophagitis, Barrett's esophagus, varices, infection, or neoplasia.  There was a small hiatal hernia present, and in the hiatal hernia pouch, was a roughly 1 cm smooth nodule which I biopsied at the conclusion of the procedure.  The stomach was entered. It contained a small amount of clear fluid, no blood or coffee-ground material.  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  Specifically,the stomach, as was the case back in March, had multiple diffuse prominent and somewhat erythematous folds. However, these had smooth mucosa, without nodularity, verrucous character, or ulceration, apart from a couple of tiny superficial erosions in the antral region. These prominent folds were present throughout the stomach, including the prepyloric region which ordinarily does not have  any folds at all.  The duodenum similarly had prominent, erythematous folds as had been noted back in March.  The patient had been kept off his Xarelto last night, and we obtained multiple biopsies from the stomach and also some from the duodenum. As noted, the nodule in his hiatal hernia pouch was also biopsied and then the scope was removed from the patient, who tolerated the procedure well and there were no apparent complications.     COMPLICATIONS: None  ENDOSCOPIC IMPRESSION:  1. Endoscopic appearance similar to last March.  2. Specifically, markedly edematous folds in the stomach and duodenum, without any dominant mass or evidence of malignant ulceration. Multiple biopsies obtained.  3. Nodule in hiatal hernia pouch of uncertain significance, biopsied.  RECOMMENDATIONS:  I have discussed the findings with Dr. Mancel Bale. We will await pathology results and followup will be in his office in the near future.  The patient may resume his Xarelto tonight.    _______________________________ Achille Rich, MD 11/07/2012 4:38 PM    PATIENT NAME:  [name] [name] MR#: [id number]

## 2012-11-08 ENCOUNTER — Encounter (HOSPITAL_COMMUNITY): Payer: Self-pay | Admitting: Gastroenterology

## 2012-11-08 ENCOUNTER — Other Ambulatory Visit: Payer: Self-pay | Admitting: *Deleted

## 2012-11-08 NOTE — Addendum Note (Signed)
Addended by: Caleb Popp on: 11/08/2012 04:14 PM   Modules accepted: Orders, Medications

## 2012-11-08 NOTE — Telephone Encounter (Signed)
THIS REFILL REQUEST FOR CAPECITABINE WAS GIVEN TO DR.SHERRILL'S NURSE, TANYA WHITLOCK,RN. 

## 2012-11-08 NOTE — Telephone Encounter (Signed)
Per Dr. Truett Perna, Xeloda has been discontinued. Note faxed to [facility] to DC Xeloda.

## 2012-11-12 ENCOUNTER — Telehealth: Payer: Self-pay | Admitting: *Deleted

## 2012-11-12 NOTE — Telephone Encounter (Signed)
Pt's wife called and left message asking "should he see Dr. Truett Perna before our appt 9/4 because of the pathology results from Dr. Matthias Hughs?"  Per Dr. Truett Perna, called and spoke with pt instructing him that MD said to stay with the 9/4 appt already schedule.  Pt verbalized understanding.

## 2012-11-21 ENCOUNTER — Telehealth: Payer: Self-pay

## 2012-11-21 ENCOUNTER — Ambulatory Visit (HOSPITAL_COMMUNITY)
Admission: RE | Admit: 2012-11-21 | Discharge: 2012-11-21 | Disposition: A | Discharge status: Home / Self Care | Payer: Medicare Other | Source: Ambulatory Visit | Attending: Oncology | Admitting: Oncology

## 2012-11-21 ENCOUNTER — Ambulatory Visit (HOSPITAL_BASED_OUTPATIENT_CLINIC_OR_DEPARTMENT_OTHER): Payer: Medicare Other | Admitting: Oncology

## 2012-11-21 ENCOUNTER — Other Ambulatory Visit (HOSPITAL_BASED_OUTPATIENT_CLINIC_OR_DEPARTMENT_OTHER): Payer: Medicare Other

## 2012-11-21 VITALS — BP 114/79 | HR 99 | Temp 97.4°F | Resp 21 | Ht 73.0 in | Wt 213.5 lb

## 2012-11-21 VITALS — BP 112/71

## 2012-11-21 DIAGNOSIS — C169 Malignant neoplasm of stomach, unspecified: Secondary | ICD-10-CM

## 2012-11-21 DIAGNOSIS — R180 Malignant ascites: Secondary | ICD-10-CM | POA: Insufficient documentation

## 2012-11-21 DIAGNOSIS — R188 Other ascites: Secondary | ICD-10-CM

## 2012-11-21 LAB — COMPREHENSIVE METABOLIC PANEL (CC13)
Albumin: 2.5 g/dL — ABNORMAL LOW (ref 3.5–5.0)
Alkaline Phosphatase: 115 U/L (ref 40–150)
BUN: 16.6 mg/dL (ref 7.0–26.0)
Glucose: 119 mg/dl (ref 70–140)
Total Bilirubin: 0.79 mg/dL (ref 0.20–1.20)

## 2012-11-21 LAB — CBC WITH DIFFERENTIAL/PLATELET
Basophils Absolute: 0 10*3/uL (ref 0.0–0.1)
Eosinophils Absolute: 0.1 10*3/uL (ref 0.0–0.5)
HCT: 31.7 % — ABNORMAL LOW (ref 38.4–49.9)
HGB: 10.9 g/dL — ABNORMAL LOW (ref 13.0–17.1)
LYMPH%: 18.3 % (ref 14.0–49.0)
MCV: 120 fL — ABNORMAL HIGH (ref 79.3–98.0)
MONO%: 16 % — ABNORMAL HIGH (ref 0.0–14.0)
NEUT#: 4.2 10*3/uL (ref 1.5–6.5)
Platelets: 228 10*3/uL (ref 140–400)

## 2012-11-21 MED ORDER — OXYCODONE-ACETAMINOPHEN 5-325 MG PO TABS: 1.0000 | ORAL_TABLET | ORAL | Status: DC | PRN

## 2012-11-21 NOTE — Procedures (Signed)
US guided diagnostic/therapeutic paracentesis performed yielding 6 liters slightly turbid, yellow fluid. A portion of the fluid was sent for cytology. No immediate complications.

## 2012-11-21 NOTE — Telephone Encounter (Signed)
gv and printeda ppt sched and avs for pt for Sept pt sched for para at 2:30

## 2012-11-21 NOTE — Telephone Encounter (Signed)
reprinted pt appt for Sept...accidentally sched appt in oct...appt cx...pt aware

## 2012-11-21 NOTE — Progress Notes (Signed)
[name] [name]    OFFICE PROGRESS NOTE   INTERVAL HISTORY:   He returns as scheduled. Dr. Matthias Hughs performed an upper endoscopy on 11/07/2012. Edematous folds were again noted in the stomach and duodenum. No dominant mass or malignant ulceration. Multiple biopsies were obtained. The biopsies from the stomach, duodenum, and esophagogastric junction revealed invasive adenocarcinoma.  He complains of increased malaise over the past few weeks. He also reports early satiety and abdomen/back pain. He has exertional dyspnea. [name] [name] has developed increased swelling at the lower extremities, abdomen, and scrotum. He continues once daily IV fluids.  This morning he had a transient episode of expressive aphasia and left arm weakness. These symptoms resolved over approximately 1 hour.  Objective:  Vital signs in last 24 hours:  Blood pressure 114/79, pulse 99, temperature 97.4 F (36.3 C), temperature source Oral, resp. rate 21, height 6\' 1"  (1.854 m), weight 213 lb 8 oz (96.843 kg).    HEENT: No thrush or ulcer Resp: Lungs with decreased breath sounds at the bases and and inspiratory rhonchi Cardio: Regular rate and rhythm GI: Markedly distended, no mass Vascular: Pitting edema below the knee bilaterally, presacral edema GU: Penile and scrotal edema    Portacath/PICC-without erythema  Lab Results:  Lab Results  Component Value Date   WBC 6.5 11/21/2012   HGB 10.9* 11/21/2012   HCT 31.7* 11/21/2012   MCV 120.0* 11/21/2012   PLT 228 11/21/2012   ANC 4.2  BUN 16.6, creatinine 1.1, albumin 2.5    Medications: I have reviewed the patient's current medications.  Assessment/Plan: 1.Metastatic gastric cancer-adenocarcinoma documented on the biopsy of the stomach and transverse colon. Exploratory laparoscopy 06/13/2012 with no tumor seen, positive cytology for metastatic adenocarcinoma. Biopsy of a left scalene nodal 06/13/2012 confirmed metastatic adenocarcinoma. Received  first cycle CAPOX on 07/11/2012. He started Xeloda but held it after 2 days due to hypotension, diarrhea and dehydration. The Xeloda was resumed 07/22/2012. He received cycle 2 CAPOX beginning 08/01/2012, cycle 3 on 08/23/2012 , cycle 4 on 09/12/2012, cycle 5 10/03/2012,cycle 6 on 10/24/2012  -Status post an upper endoscopy 11/07/2012 with prominent gastric and duodenal folds noted with biopsies confirming invasive adenocarcinoma 2. Partial colonic obstruction secondary to tumor invading the transverse colon-status post a palliative diverting distal ileostomy 06/10/2012.  3. History of atrial fibrillation-maintained on xarelto.  4. Anorexia/weight loss secondary to metastatic gastric cancer and the colonic obstruction. He has developed increased anorexia over the past few weeks  5. Dehydration - due to copious output from ileostomy, poor oral intake and diarrhea. Not improved with Imodium and Metamucil. The diarrhea improved when he began paregoric. He is now taking 1 L of IV fluids daily via a PICC. He is no longer taking paregoric. He notes the ileostomy output is "thicker" after beginning eating bananas.  6. Subxiphoid episodic pain on 07/16/2012 -resolved.  7. Admission with transient dysarthria 07/24/2012-no acute change on CNS imaging, he reports recurrent episodes of dysarthria since discharge from the hospital. Episode of transient left arm weakness during the week of 09/02/2012-spontaneously resolved. He had another episode of expressive aphasia on 09/17/2012. He was placed on Keppra by Dr. Pearlean Brownie , aphasia and left arm weakness 10/30/2012 , recurrent symptoms on 11/21/2012 8. Moderate loss of vibratory sense at the fingers bilaterally-? Oxaliplatin neuropathy.  9.abdominal distention 11/01/2012 , no plain film evidence for a bowel obstruction. Marketed increase in the abdominal distention today    Disposition:  He has developed anasarca and appears to have massive  ascites. The upper endoscopy  confirmed persistence of the gastric cancer. I suspect the abdominal distention, pain, and anorexia are related to progression of the gastric cancer. The ascites and anasarca are likely exacerbated by the IV fluids.  I discussed the current situation with [name] [name] and [name] [name]. I do not recommend further chemotherapy. He will be referred for a palliative paracentesis today. The ascitic fluid will be sent for a cytology evaluation. He will discontinue the home IV fluids.  We prescribed Percocet for pain. [name] [name] will return for an office visit on 12/02/2012.   Thornton Papas, MD  11/21/2012  1:37 PM

## 2012-11-22 ENCOUNTER — Telehealth: Payer: Self-pay | Admitting: *Deleted

## 2012-11-22 NOTE — Telephone Encounter (Signed)
Called pt with results of paracentesis. Per Dr. Truett Perna: Ascites fluid shows cancer cells, as expected. Pt voiced understanding. States he feels a lot better after paracentesis. Instructed him to call office if he needs paracentesis repeated. He voiced understanding.

## 2012-11-26 ENCOUNTER — Telehealth: Payer: Self-pay | Admitting: *Deleted

## 2012-11-26 DIAGNOSIS — C169 Malignant neoplasm of stomach, unspecified: Secondary | ICD-10-CM

## 2012-11-26 NOTE — Telephone Encounter (Signed)
Pt wife called requesting pt to have paracentesis; states "he has filled back up"  Notified pt's wife that therapeutic paracentesis has been set up for Thursday 9/11 at 1:45.  Pt's wife verbalized understanding and confirmed that pt is no longer receiving D5W fluids from [facility].  MD made aware.

## 2012-11-28 ENCOUNTER — Ambulatory Visit (HOSPITAL_COMMUNITY)
Admission: RE | Admit: 2012-11-28 | Discharge: 2012-11-28 | Disposition: A | Discharge status: Home / Self Care | Payer: Medicare Other | Source: Ambulatory Visit | Attending: Oncology | Admitting: Oncology

## 2012-11-28 VITALS — BP 110/62

## 2012-11-28 DIAGNOSIS — C169 Malignant neoplasm of stomach, unspecified: Secondary | ICD-10-CM | POA: Insufficient documentation

## 2012-11-28 DIAGNOSIS — R188 Other ascites: Secondary | ICD-10-CM | POA: Insufficient documentation

## 2012-11-28 NOTE — Procedures (Signed)
US guided therapeutic paracentesis performed yielding 8 liters slightly turbid, yellow fluid. No immediate complications.

## 2012-12-02 ENCOUNTER — Ambulatory Visit (HOSPITAL_BASED_OUTPATIENT_CLINIC_OR_DEPARTMENT_OTHER): Payer: Medicare Other

## 2012-12-02 ENCOUNTER — Telehealth: Payer: Self-pay | Admitting: Oncology

## 2012-12-02 ENCOUNTER — Other Ambulatory Visit: Payer: Self-pay | Admitting: *Deleted

## 2012-12-02 ENCOUNTER — Ambulatory Visit (HOSPITAL_BASED_OUTPATIENT_CLINIC_OR_DEPARTMENT_OTHER): Payer: Medicare Other | Admitting: Oncology

## 2012-12-02 VITALS — BP 91/58 | HR 89 | Temp 97.4°F | Resp 20 | Ht 73.0 in | Wt 180.7 lb

## 2012-12-02 DIAGNOSIS — C169 Malignant neoplasm of stomach, unspecified: Secondary | ICD-10-CM

## 2012-12-02 DIAGNOSIS — Z23 Encounter for immunization: Secondary | ICD-10-CM

## 2012-12-02 MED ORDER — INFLUENZA VAC SPLIT QUAD 0.5 ML IM SUSP
0.5000 mL | Freq: Once | INTRAMUSCULAR | Status: AC
  Administered 2012-12-02: 0.5 mL via INTRAMUSCULAR
  Filled 2012-12-02: qty 0.5

## 2012-12-02 MED ORDER — PREDNISONE 10 MG PO TABS: 10.0000 mg | ORAL_TABLET | Freq: Every day | ORAL | Status: DC

## 2012-12-02 NOTE — Telephone Encounter (Signed)
FAXED PT MEDICAL RECORDS TO [facility]

## 2012-12-02 NOTE — Patient Instructions (Addendum)
Fluconazole injection What is this medicine? FLUCONAZOLE (floo KON na zole) is an antifungal medicine. It is used to treat or prevent certain kinds of fungal or yeast infections. This medicine may be used for other purposes; ask your health care provider or pharmacist if you have questions. What should I tell my health care provider before I take this medicine? They need to know if you have any of these conditions: -history of irregular heart beat -kidney disease -an unusual or allergic reaction to fluconazole, other antifungal medicines, foods, dyes or preservatives -pregnant or trying to get pregnant -breast-feeding How should I use this medicine? This medicine is for injection into a vein. It is usually given by a health care professional in a hospital or clinic setting. If you get this medicine at home, you will be taught how to prepare and give this medicine. Use exactly as directed. Take your medicine at regular intervals. Do not take your medicine more often than directed. It is important that you put your used needles and syringes in a special sharps container. Do not put them in a trash can. If you do not have a sharps container, call your pharmacist or healthcare provider to get one. Talk to your pediatrician regarding the use of this medicine in children. Special care may be needed. Overdosage: If you think you have taken too much of this medicine contact a poison control center or emergency room at once. NOTE: This medicine is only for you. Do not share this medicine with others. What if I miss a dose? This does not apply. What may interact with this medicine? Do not take this medicine with any of the following medications: -cisapride -pimozide -red yeast rice This medicine may also interact with the following medications: -birth control pills -cyclosporine -diuretics like hydrochlorothiazide -medicines for diabetes that are taken by mouth -medicines for high cholesterol  like atorvastatin, lovastatin or simvastatin -phenytoin -ramelteon -rifabutin -rifampin -some medicines for anxiety or sleep -tacrolimus -terfenadine -theophylline -warfarin This list may not describe all possible interactions. Give your health care provider a list of all the medicines, herbs, non-prescription drugs, or dietary supplements you use. Also tell them if you smoke, drink alcohol, or use illegal drugs. Some items may interact with your medicine. What should I watch for while using this medicine? Tell your doctor if your symptoms do not improve. If you are taking this medicine for a long time you may need blood work. Some fungal infections need many weeks or months of treatment to cure completely. Alcohol can increase possible damage to your liver from this medicine. Avoid alcoholic drinks. What side effects may I notice from receiving this medicine? Side effects that you should report to your doctor or health care professional as soon as possible: -allergic reactions like skin rash or itching, hives, swelling of the lips, mouth, tongue, or throat -dark urine -feeling dizzy or faint -irregular heartbeat or chest pain -pain, redness at site of injection -redness, blistering, peeling or loosening of the skin, including inside the mouth -stomach pain -trouble breathing -unusual bruising or bleeding -vomiting -yellowing of the eyes or skin Side effects that usually do not require medical attention (report to your doctor or health care professional if they continue or are bothersome): -changes in how food tastes -diarrhea -headache -stomach upset, nausea This list may not describe all possible side effects. Call your doctor for medical advice about side effects. You may report side effects to FDA at 1-800-FDA-1088. Where should I keep my medicine? Keep  out of the reach of children. If you are using this medicine at home, you will be instructed on how to store this medicine.  Throw away any unused medicine after the expiration date on the label. NOTE: This sheet is a summary. It may not cover all possible information. If you have questions about this medicine, talk to your doctor, pharmacist, or health care provider.  2012, Elsevier/Gold Standard. (07/10/2007 1:30:30 PM)    Peripherally Inserted Central Catheter (PICC) Removal and Care After A peripherally inserted catheter (PICC) is removed when it is no longer needed, when it is clotted, or when it may be infected.  PROCEDURE  The removal of a PICC is usually painless. Removing the tape that holds the PICC in place may be the most discomfort you have.  A physicians order needs to be obtained to have the PICC removed.  A PICC can be removed in the hospital or in an outpatient setting.  Never remove or take out the PICC yourself. Only a trained clinical professional, such as a PICC nurse, should remove the PICC.  If a PICC is suspected to be infected, the PICC tip is sent to the lab for culture. HOME CARE INSTRUCTIONS  When the PICC is out, pressure is applied at the insertion site to prevent bleeding. An antibiotic ointment may be applied to the insertion site. A dry, sterile gauze is then taped over the insertion site. This dressing should stay on for 24 hours.  After the 24 hours is up, the dressing may be removed. The PICC insertion site is very small. A small scab may develop over the insertion site. It is okay to wash the site gently with soap and water. Be careful to not remove or pick the scab off. After washing, gently pat the site dry. You do not need to put another dressing over the insertion site after you wash it.  Avoid heavy, strenuous physical activity for 24 hours after the PICC is removed. This includes things like:  Weight lifting.  Strenuous yard work.  Any physical activity with repetitive arm movement. SEEK MEDICAL CARE IF:  Call or see your caregiver as soon as possible if you  develop the following conditions in the arm in which the PICC was inserted:  Swelling or puffiness.  Increasing tenderness or pain. SEEK IMMEDIATE MEDICAL CARE IF:  You develop any of the following conditions in the arm that had the PICC:  Numbness or tingling in your fingers, hand, or arm.  You arm has a bluish color and it is cold to the touch.  Redness around the insertion site or a red-streak that goes up your arm.  Any type of drainage from the PICC insertion site. This includes drainage such as:  Bleeding from the insertion site. (If this happens, apply firm, direct pressure to the PICC insertion site with a clean towel.)  Drainage that is yellow or tan in color.  You have an oral temperature above 102 F (38.9 C), not controlled by medicine. Document Released: 08/24/2009 Document Revised: 05/29/2011 Document Reviewed: 08/24/2009 [facility] Patient Information 2014 [place], [place].

## 2012-12-02 NOTE — Progress Notes (Signed)
Patient observed for 30 minutes after PICC line removal. Discharge instructions given. Patient states he feels fine.

## 2012-12-02 NOTE — Telephone Encounter (Signed)
gv pt appt schedule for October.  °

## 2012-12-02 NOTE — Progress Notes (Signed)
PICC pulled per protocol. Petroleum gauze , 4/4 and hypofix tape placed as pressure gauze. Pt tolerated well.

## 2012-12-02 NOTE — Progress Notes (Signed)
[facility]    OFFICE PROGRESS NOTE   INTERVAL HISTORY:   He returns for a scheduled visit. He underwent a therapeutic paracentesis procedures on 11/21/2012 and 11/28/2012. 8 L of fluid were removed on 11/28/2012. He felt dizzy following this procedure.  He continues to have orthostatic symptoms. [name] [name] reports a poor appetite and energy level. He does not feel the ileostomy output is causing diarrhea. No nausea or vomiting. No pain. He had temporary bilateral leg paralysis on 11/27/2012 after a PICC flush.  He is now ambulating with a walker. The abdominal distention has partially returned after the paracentesis on 11/28/2012.  The cytology from 11/21/2012 returned consistent with metastatic adenocarcinoma.    Objective:  Vital signs in last 24 hours:  Blood pressure 91/58, pulse 89, temperature 97.4 F (36.3 C), temperature source Oral, resp. rate 20, height 6\' 1"  (1.854 m), weight 180 lb 11.2 oz (81.965 kg).    HEENT: The mucous membranes are moist, no thrush Resp: Lungs clear bilaterally Cardio: Regular rate and rhythm GI: No hepatomegaly, mildly distended,? Left upper quadrant mass, nontender Vascular: No leg edema Neuro: Alert and oriented, follows commands, ambulates    Portacath/PICC-without erythema  Lab Results:    Medications: I have reviewed the patient's current medications.  Assessment/Plan: 1.Metastatic gastric cancer-adenocarcinoma documented on the biopsy of the stomach and transverse colon. Exploratory laparoscopy 06/13/2012 with no tumor seen, positive cytology for metastatic adenocarcinoma. Biopsy of a left scalene nodal 06/13/2012 confirmed metastatic adenocarcinoma. Received first cycle CAPOX on 07/11/2012. He started Xeloda but held it after 2 days due to hypotension, diarrhea and dehydration. The Xeloda was resumed 07/22/2012. He received cycle 2 CAPOX beginning 08/01/2012, cycle 3 on 08/23/2012 , cycle 4 on 09/12/2012, cycle 5  10/03/2012,cycle 6 on 10/24/2012  -Status post an upper endoscopy 11/07/2012 with prominent gastric and duodenal folds noted with biopsies confirming invasive adenocarcinoma  2. Partial colonic obstruction secondary to tumor invading the transverse colon-status post a palliative diverting distal ileostomy 06/10/2012.  3. History of atrial fibrillation-maintained on xarelto.  4. Anorexia/weight loss secondary to metastatic gastric cancer and the colonic obstruction. He has developed increased anorexia over the past few weeks  5. Dehydration - due to copious output from ileostomy, poor oral intake and diarrhea. Not improved with Imodium and Metamucil. The diarrhea improved when he began paregoric. He is no longer taking paregoric. He notes the ileostomy output is "thicker" after beginning eating bananas.  6. Subxiphoid episodic pain on 07/16/2012 -resolved.  7. Admission with transient dysarthria 07/24/2012-no acute change on CNS imaging, he reports recurrent episodes of dysarthria since discharge from the hospital. Episode of transient left arm weakness during the week of 09/02/2012-spontaneously resolved. He had another episode of expressive aphasia on 09/17/2012. He was placed on Keppra by Dr. Pearlean Brownie , aphasia and left arm weakness 10/30/2012 , recurrent symptoms on 11/21/2012 . Transient bilateral leg paralysis 11/27/2012 8. Moderate loss of vibratory sense at the fingers bilaterally-? Oxaliplatin neuropathy.  9.abdominal distention 11/01/2012 , no plain film evidence for a bowel obstruction. Marketed increase in the abdominal distention 11/21/2012, status post therapeutic paracentesis procedures 11/21/2012 and 11/28/2012 with the cytology from 11/21/2012 confirming metastatic adenocarcinoma   Disposition:  [name] [name] appears to have a slow decline in his performance status. He now has malignant ascites that has required 2 palliative paracentesis procedures. He has orthostatic symptoms. I suspect the  orthostasis is related to the large volume paracenteses and limited oral intake.  I discussed the prognosis and  treatment options with [name] [name] and his wife. I do not recommend further chemotherapy. The plan is to follow him with a supportive care approach. He agrees to a [facility] referral. We discussed CPR and ACLS issues. He will be placed on a no CODE BLUE status.  [name] [name] will discontinue diltiazem. He will begin low-dose prednisone to see if this helps anorexia, malaise, and hypotension. The PICC was discontinued today. He received an influenza vaccine today.  He will be scheduled for as needed paracentesis procedures with a limit of 4 L of fluid removed.  Approximately 30 minutes were spent with the patient today. The majority of the time was spent in counseling/coordination of care.   Thornton Papas, MD  12/02/2012  2:06 PM

## 2012-12-06 ENCOUNTER — Ambulatory Visit (HOSPITAL_COMMUNITY)
Admission: RE | Admit: 2012-12-06 | Discharge: 2012-12-06 | Disposition: A | Discharge status: Home / Self Care | Source: Ambulatory Visit | Attending: Oncology | Admitting: Oncology

## 2012-12-06 DIAGNOSIS — R188 Other ascites: Secondary | ICD-10-CM | POA: Insufficient documentation

## 2012-12-06 DIAGNOSIS — C169 Malignant neoplasm of stomach, unspecified: Secondary | ICD-10-CM | POA: Insufficient documentation

## 2012-12-06 NOTE — Procedures (Signed)
Successful US guided paracentesis from Right middle quadrant.  Yielded 4 liters of amber colored fluid.  No immediate complications.  Pt tolerated well.   Specimen was not sent for labs.  Pattricia Boss D PA-C 12/06/2012 2:18 PM

## 2012-12-13 ENCOUNTER — Ambulatory Visit (HOSPITAL_COMMUNITY): Admission: RE | Admit: 2012-12-13 | Payer: Medicare Other | Source: Ambulatory Visit

## 2012-12-13 ENCOUNTER — Ambulatory Visit (HOSPITAL_COMMUNITY)
Admission: RE | Admit: 2012-12-13 | Discharge: 2012-12-13 | Disposition: A | Discharge status: Home / Self Care | Source: Ambulatory Visit | Attending: Oncology | Admitting: Oncology

## 2012-12-13 ENCOUNTER — Other Ambulatory Visit: Payer: Self-pay | Admitting: Oncology

## 2012-12-13 VITALS — BP 86/49

## 2012-12-13 DIAGNOSIS — R188 Other ascites: Secondary | ICD-10-CM | POA: Insufficient documentation

## 2012-12-13 DIAGNOSIS — C169 Malignant neoplasm of stomach, unspecified: Secondary | ICD-10-CM | POA: Insufficient documentation

## 2012-12-13 NOTE — Procedures (Signed)
US guided therapeutic paracentesis performed yielding 2 liters(maximum ordered secondary to hypotension) slightly turbid, yellow fluid. No immediate complications.

## 2012-12-16 ENCOUNTER — Other Ambulatory Visit: Payer: Self-pay | Admitting: Oncology

## 2012-12-16 DIAGNOSIS — R188 Other ascites: Secondary | ICD-10-CM

## 2012-12-18 ENCOUNTER — Other Ambulatory Visit: Payer: Self-pay | Admitting: *Deleted

## 2012-12-18 ENCOUNTER — Ambulatory Visit (HOSPITAL_BASED_OUTPATIENT_CLINIC_OR_DEPARTMENT_OTHER): Payer: Medicare Other | Admitting: Oncology

## 2012-12-18 VITALS — BP 87/63 | HR 87 | Resp 17 | Ht 73.0 in

## 2012-12-18 DIAGNOSIS — C169 Malignant neoplasm of stomach, unspecified: Secondary | ICD-10-CM

## 2012-12-18 NOTE — Progress Notes (Signed)
   [facility]    OFFICE PROGRESS NOTE   INTERVAL HISTORY:   He returns for a scheduled visit. He last underwent a palliative paracentesis on 12/13/2012. 2 L of fluid were removed. He complains of abdominal distention. He would like to have more fluid withdrawn this week. He is scheduled for a paracentesis on 12/20/2012.  [name] [name] is eating and examined the morning, but does not have an appetite later in the day. Intermittent nausea. The ileostomy is functioning. He feels unstable when ambulating. He has not leaving the home. A hospice nurse is visiting once per week.  Objective:  Vital signs in last 24 hours:  Blood pressure 87/63, pulse 87, resp. rate 17, height 6\' 1"  (1.854 m).    HEENT: No thrush Resp: Lungs clear bilaterally Cardio: Irregular GI: Distended, nontender Vascular: No leg edema   Medications: I have reviewed the patient's current medications.  Assessment/Plan: 1.Metastatic gastric cancer-adenocarcinoma documented on the biopsy of the stomach and transverse colon. Exploratory laparoscopy 06/13/2012 with no tumor seen, positive cytology for metastatic adenocarcinoma. Biopsy of a left scalene nodal 06/13/2012 confirmed metastatic adenocarcinoma. Received first cycle CAPOX on 07/11/2012. He started Xeloda but held it after 2 days due to hypotension, diarrhea and dehydration. The Xeloda was resumed 07/22/2012. He received cycle 2 CAPOX beginning 08/01/2012, cycle 3 on 08/23/2012 , cycle 4 on 09/12/2012, cycle 5 10/03/2012,cycle 6 on 10/24/2012  -Status post an upper endoscopy 11/07/2012 with prominent gastric and duodenal folds noted with biopsies confirming invasive adenocarcinoma  2. Partial colonic obstruction secondary to tumor invading the transverse colon-status post a palliative diverting distal ileostomy 06/10/2012.  3. History of atrial fibrillation-maintained on xarelto.  4. Anorexia/weight loss secondary to metastatic gastric cancer and the  colonic obstruction. He has developed increased anorexia over the past few weeks  5. Dehydration - due to copious output from ileostomy, poor oral intake and diarrhea. Not improved with Imodium and Metamucil. The diarrhea improved when he began paregoric. He is no longer taking paregoric. He notes the ileostomy output is "thicker" after beginning eating bananas.  6. Subxiphoid episodic pain on 07/16/2012 -resolved.  7. Admission with transient dysarthria 07/24/2012-no acute change on CNS imaging, he reports recurrent episodes of dysarthria since discharge from the hospital. Episode of transient left arm weakness during the week of 09/02/2012-spontaneously resolved. He had another episode of expressive aphasia on 09/17/2012. He was placed on Keppra by Dr. Pearlean Brownie , aphasia and left arm weakness 10/30/2012 , recurrent symptoms on 11/21/2012 . Transient bilateral leg paralysis 11/27/2012  8. Moderate loss of vibratory sense at the fingers bilaterally-? Oxaliplatin neuropathy.  9.abdominal distention 11/01/2012 , no plain film evidence for a bowel obstruction. Marketed increase in the abdominal distention 11/21/2012, status post therapeutic paracentesis procedures with the cytology from 11/21/2012 confirming metastatic adenocarcinoma   Disposition:  [name] [name] has metastatic gastric cancer. He is enrolled in the [facility] hospice program and the goal is comfort care. We discontinued non-essential medications today. The orthostatic symptoms are likely related to a degree of intravascular depletion secondary to the ileostomy output. I encouraged him to push oral fluids. He will continue therapeutic paracentesis procedures as tolerated.  [name] [name] plans to continue clinical followup with the [facility] program. It is difficult for him to travel here. We will see him in the future as needed.   Thornton Papas, MD  12/18/2012  12:22 PM

## 2012-12-18 NOTE — Addendum Note (Signed)
Addended by: Wandalee Ferdinand on: 12/18/2012 12:51 PM   Modules accepted: Orders

## 2012-12-20 ENCOUNTER — Ambulatory Visit (HOSPITAL_COMMUNITY)
Admission: RE | Admit: 2012-12-20 | Discharge: 2012-12-20 | Disposition: A | Discharge status: Home / Self Care | Source: Ambulatory Visit | Attending: Oncology | Admitting: Oncology

## 2012-12-20 ENCOUNTER — Other Ambulatory Visit (HOSPITAL_COMMUNITY): Payer: Self-pay

## 2012-12-20 DIAGNOSIS — R188 Other ascites: Secondary | ICD-10-CM

## 2012-12-20 NOTE — Procedures (Signed)
Successful US guided paracentesis from LLQ.  Yielded 4L of amber colored fluid.  No immediate complications.  Pt tolerated well. SBP remained in 100s throughout  Specimen was not sent for labs.  Brayton El PA-C 12/20/2012 1:48 PM

## 2012-12-24 ENCOUNTER — Telehealth: Payer: Self-pay | Admitting: *Deleted

## 2012-12-24 MED ORDER — PREDNISONE 10 MG PO TABS: 10.0000 mg | ORAL_TABLET | Freq: Every day | ORAL | Status: AC

## 2012-12-24 MED ORDER — OXYCODONE-ACETAMINOPHEN 5-325 MG PO TABS: 1.0000 | ORAL_TABLET | ORAL | Status: AC | PRN

## 2012-12-24 NOTE — Telephone Encounter (Signed)
Message from [facility] with Hospice requesting refill on Percocet to be faxed. Also asking if Prednisone should be refilled? Spoke with pt's wife, she is unsure if Prednisone is helping. They would like to continue for now. Reviewed with Dr. Truett Perna: Order received for refills.

## 2012-12-27 ENCOUNTER — Ambulatory Visit (HOSPITAL_COMMUNITY): Admission: RE | Admit: 2012-12-27 | Payer: Medicare Other | Source: Ambulatory Visit

## 2012-12-27 ENCOUNTER — Other Ambulatory Visit (HOSPITAL_COMMUNITY): Payer: Medicare Other

## 2012-12-30 ENCOUNTER — Ambulatory Visit (HOSPITAL_COMMUNITY): Payer: Medicare Other

## 2012-12-30 ENCOUNTER — Telehealth: Payer: Self-pay | Admitting: *Deleted

## 2012-12-30 ENCOUNTER — Ambulatory Visit: Payer: Medicare Other | Admitting: Neurology

## 2012-12-30 NOTE — Telephone Encounter (Signed)
Rapid decline in condition over weekend. Bedbound. Now on liquid ativan and roxanol. Wife very attentive and doing OK. Patient appears comfortable.

## 2012-12-31 ENCOUNTER — Ambulatory Visit: Payer: Medicare Other | Admitting: Oncology

## 2013-01-18 DEATH — deceased

## 2013-11-11 ENCOUNTER — Other Ambulatory Visit: Payer: Self-pay | Admitting: Pharmacist

## 2014-04-30 IMAGING — RF DG UGI W/ HIGH DENSITY W/KUB
17 of 24 series · 17 of 24 positions shown · non-contrast
Comparison: Ultrasound of the abdomen from today

CLINICAL DATA: Abdominal pain for 6 weeks after eating

UPPER GI SERIES WITH KUB
TECHNIQUE: Routine upper GI series was performed with thin and
high density barium.
Fluoroscopy Time: 2.1 minutes

[Series 2: run · 1 of 1 slices shown (1 of 16)]
[im 1/1]
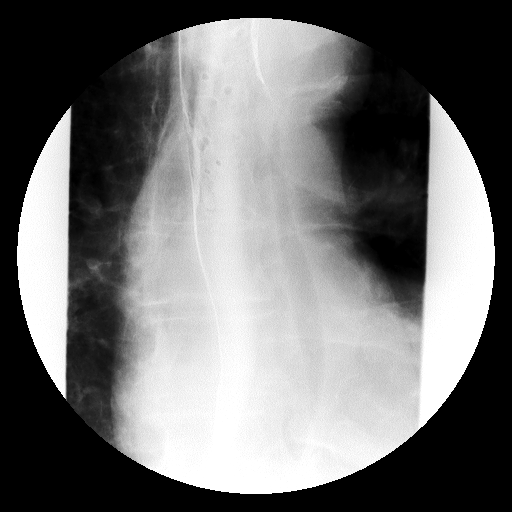

[Series 5: run · 1 of 1 slices shown (2 of 16)]
[im 1/1]
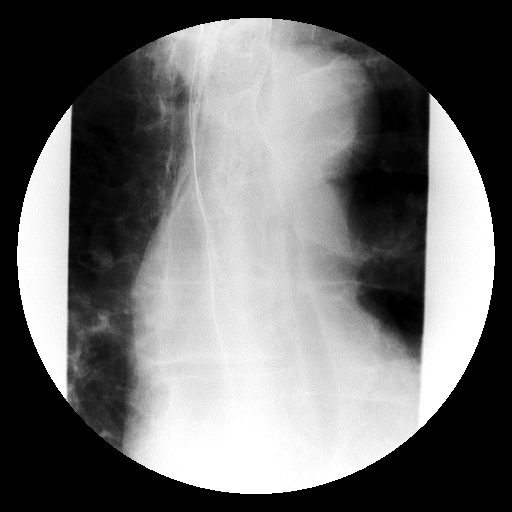

[Series 6: run · 1 of 1 slices shown (3 of 16)]
[im 1/1]
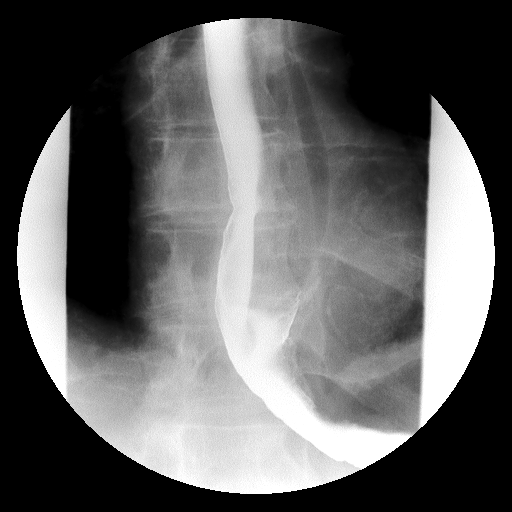

[Series 7: run · 1 of 1 slices shown (4 of 16)]
[im 1/1]
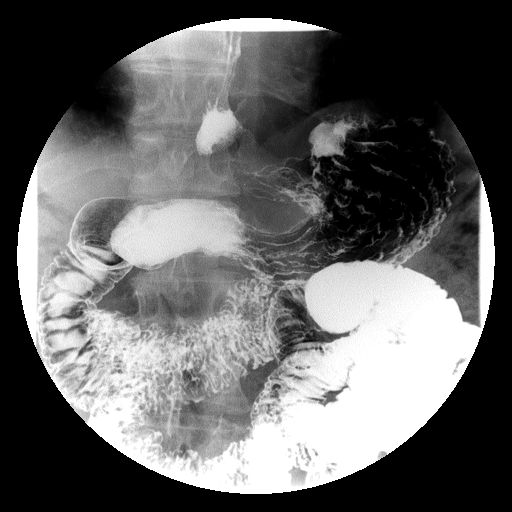

[Series 9: run · 1 of 1 slices shown (5 of 16)]
[im 1/1]
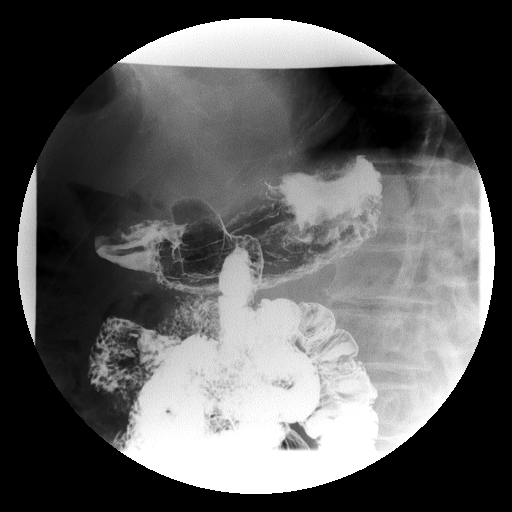

[Series 10: run · 1 of 1 slices shown (6 of 16)]
[im 1/1]
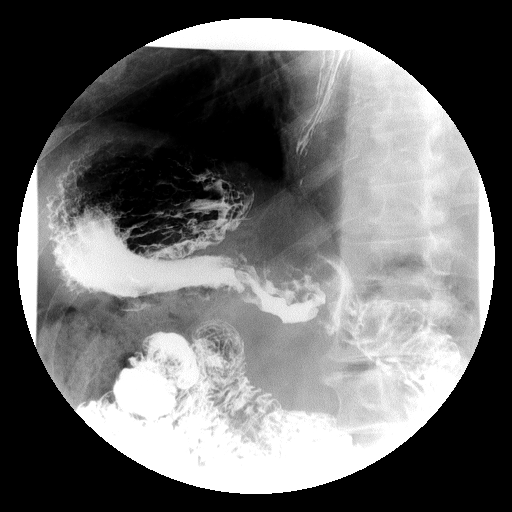

[Series 13: run · 1 of 1 slices shown (7 of 16)]
[im 1/1]
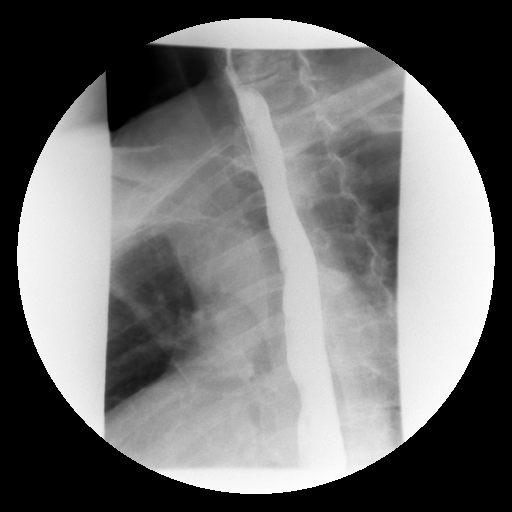

[Series 14: run · 1 of 1 slices shown (8 of 16)]
[im 1/1]
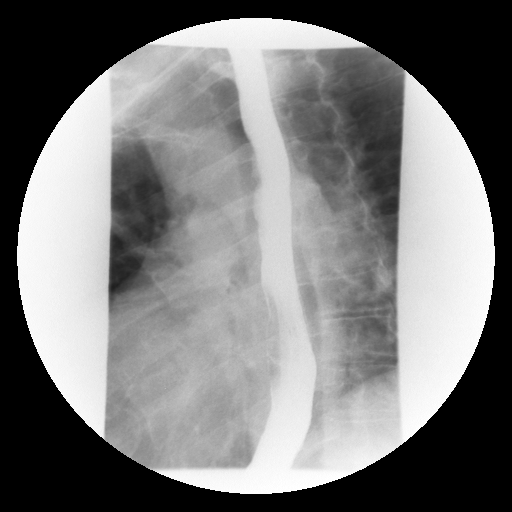

[Series 16: run · 1 of 1 slices shown (9 of 16)]
[im 1/1]
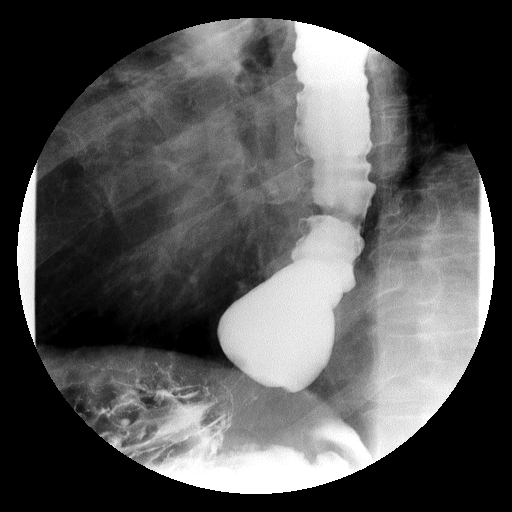

[Series 17: run · 1 of 1 slices shown (10 of 16)]
[im 1/1]
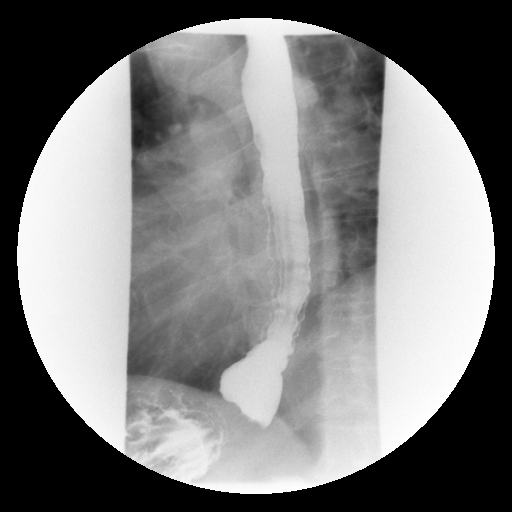

[Series 18: run · 1 of 1 slices shown (11 of 16)]
[im 1/1]
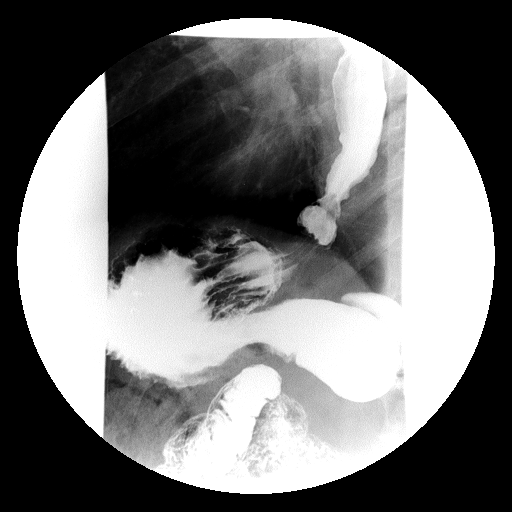

[Series 20: run · 1 of 1 slices shown (12 of 16)]
[im 1/1]
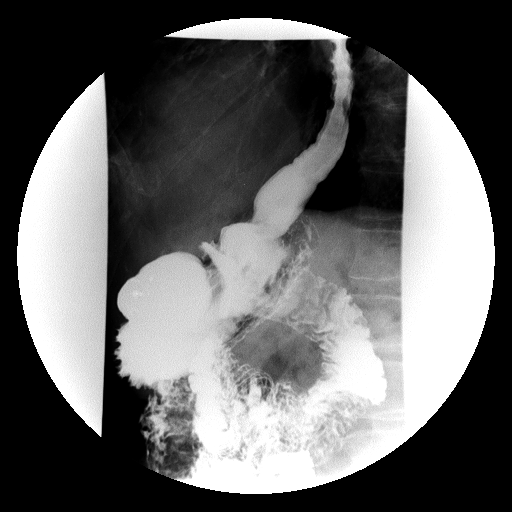

[Series 21: run · 1 of 1 slices shown (13 of 16)]
[im 1/1]
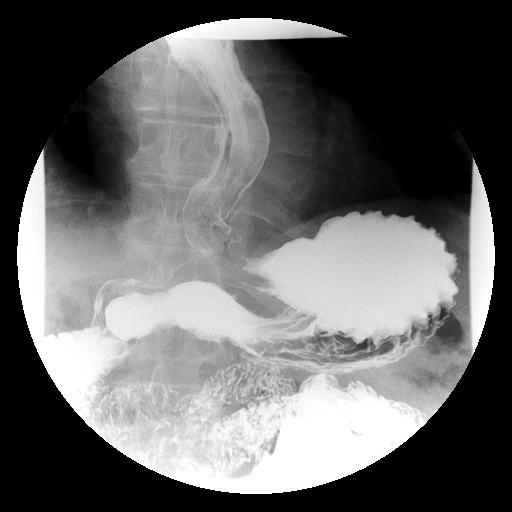

[Series 23: run · 1 of 1 slices shown (14 of 16)]
[im 1/1]
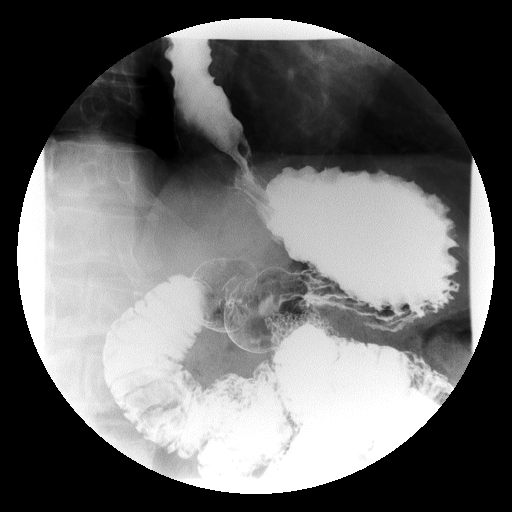

[Series 24: run · 1 of 1 slices shown (15 of 16)]
[im 1/1]
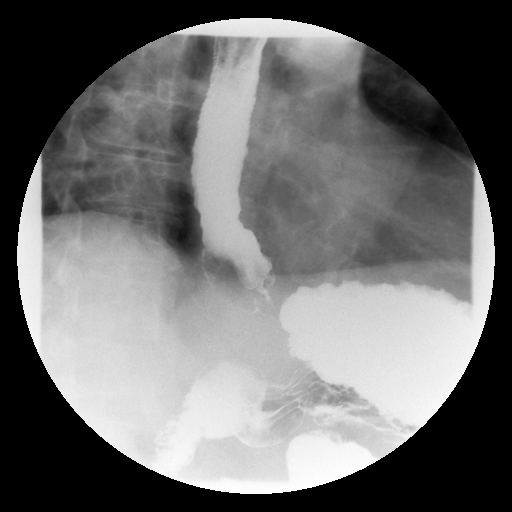

[Series 25: run · 1 of 1 slices shown (16 of 16)]
[im 1/1]
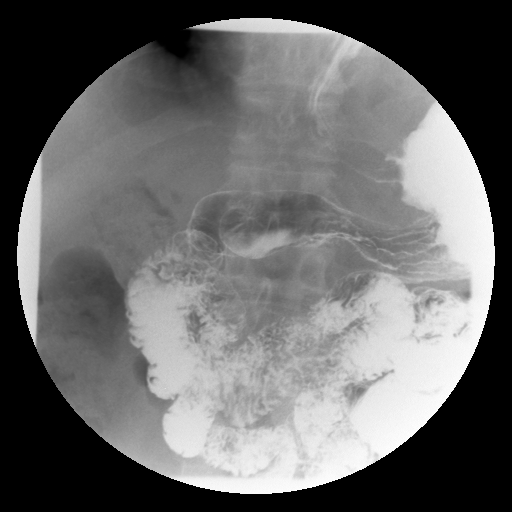

[Series 1001: view not recorded · 0.20mm/px · 1 of 1 slices shown]
[im 1/1]
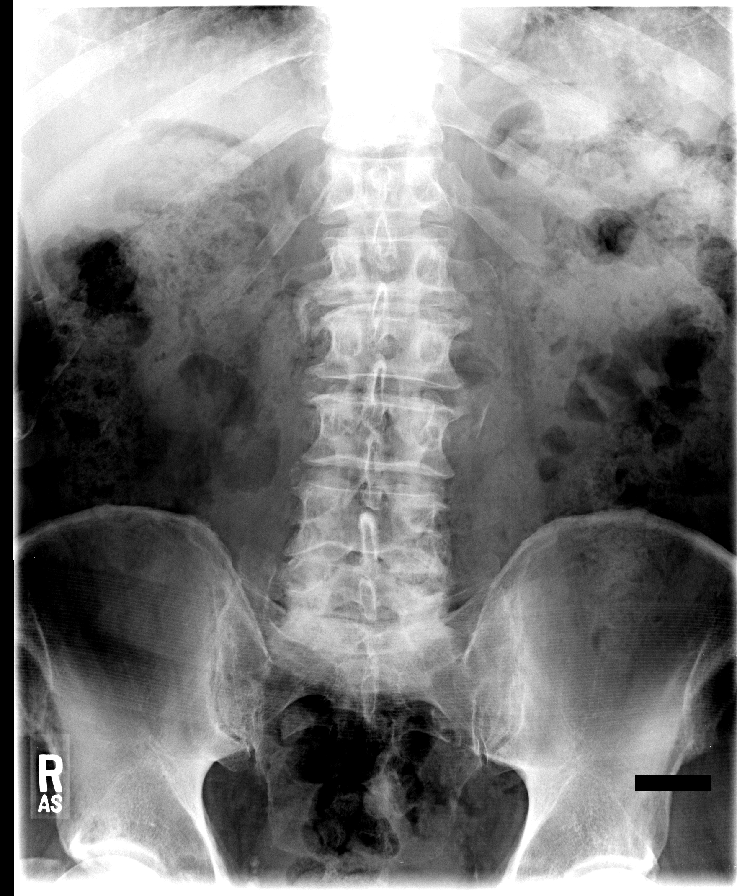

[17 of 24 positions shown; findings below may reference images not displayed]

FINDINGS: A preliminary film of the abdomen shows a moderate amount
of feces throughout the colon.  No opaque calculi are seen.  There
are degenerative changes in the lumbar spine.  There is curvilinear
calcification within the mid abdomen probably representing
abdominal aortic calcification, but on today's abdominal ultrasound
no aneurysm was identified.

A double contrast upper GI was performed.  The mucosa of the
esophagus is unremarkable although not optimally coated on this
double contrast study.  A single contrast study shows the
swallowing mechanism to be normal. There are significant tertiary
contractions in the mid and distal esophagus.  Slight prominence of
the gastric rugae is noted within the proximal stomach but no
obvious mass is seen.  This may be due to inadequate distention,
but if clinically suspicious endoscopy would be recommended.  The
more distal stomach is well seen and is normal.  The duodenal bulb
fills well and the duodenal loop is in normal position.  There is
faint gastroesophageal reflux present.
IMPRESSION: 1.  Moderate tertiary contractions in the mid and distal esophagus.
2.  Questionably prominent gastric rugae within the proximal
stomach.  No definite mass.  Correlate clinically and consider
endoscopy if warranted.
3.  Faint gastroesophageal reflux.
